# Patient Record
Sex: Female | Born: 1950
Health system: Southern US, Community
[De-identification: ages and names within clinical notes are randomized; demographics above are authoritative.]

## PROBLEM LIST (undated history)

## (undated) DIAGNOSIS — I10 Essential (primary) hypertension: Secondary | ICD-10-CM

## (undated) DIAGNOSIS — K8020 Calculus of gallbladder without cholecystitis without obstruction: Secondary | ICD-10-CM

## (undated) DIAGNOSIS — K869 Disease of pancreas, unspecified: Secondary | ICD-10-CM

## (undated) DIAGNOSIS — K219 Gastro-esophageal reflux disease without esophagitis: Secondary | ICD-10-CM

## (undated) DIAGNOSIS — K859 Acute pancreatitis without necrosis or infection, unspecified: Secondary | ICD-10-CM

## (undated) DIAGNOSIS — F329 Major depressive disorder, single episode, unspecified: Secondary | ICD-10-CM

## (undated) DIAGNOSIS — M549 Dorsalgia, unspecified: Secondary | ICD-10-CM

## (undated) DIAGNOSIS — K589 Irritable bowel syndrome without diarrhea: Secondary | ICD-10-CM

## (undated) DIAGNOSIS — E538 Deficiency of other specified B group vitamins: Secondary | ICD-10-CM

## (undated) DIAGNOSIS — R002 Palpitations: Secondary | ICD-10-CM

## (undated) DIAGNOSIS — F32A Depression, unspecified: Secondary | ICD-10-CM

## (undated) DIAGNOSIS — M1990 Unspecified osteoarthritis, unspecified site: Secondary | ICD-10-CM

## (undated) DIAGNOSIS — N912 Amenorrhea, unspecified: Secondary | ICD-10-CM

## (undated) DIAGNOSIS — R0602 Shortness of breath: Secondary | ICD-10-CM

## (undated) DIAGNOSIS — M7989 Other specified soft tissue disorders: Secondary | ICD-10-CM

## (undated) DIAGNOSIS — R32 Unspecified urinary incontinence: Secondary | ICD-10-CM

## (undated) DIAGNOSIS — K5900 Constipation, unspecified: Secondary | ICD-10-CM

## (undated) DIAGNOSIS — N390 Urinary tract infection, site not specified: Secondary | ICD-10-CM

## (undated) DIAGNOSIS — J301 Allergic rhinitis due to pollen: Secondary | ICD-10-CM

## (undated) DIAGNOSIS — F419 Anxiety disorder, unspecified: Secondary | ICD-10-CM

## (undated) DIAGNOSIS — N939 Abnormal uterine and vaginal bleeding, unspecified: Secondary | ICD-10-CM

## (undated) DIAGNOSIS — R1310 Dysphagia, unspecified: Secondary | ICD-10-CM

## (undated) HISTORY — DX: Shortness of breath: R06.02

## (undated) HISTORY — DX: Urinary tract infection, site not specified: N39.0

## (undated) HISTORY — DX: Deficiency of other specified B group vitamins: E53.8

## (undated) HISTORY — DX: Allergic rhinitis due to pollen: J30.1

## (undated) HISTORY — DX: Amenorrhea, unspecified: N91.2

## (undated) HISTORY — DX: Calculus of gallbladder without cholecystitis without obstruction: K80.20

## (undated) HISTORY — DX: Depression, unspecified: F32.A

## (undated) HISTORY — DX: Dysphagia, unspecified: R13.10

## (undated) HISTORY — DX: Acute pancreatitis without necrosis or infection, unspecified: K85.90

## (undated) HISTORY — DX: Essential (primary) hypertension: I10

## (undated) HISTORY — DX: Anxiety disorder, unspecified: F41.9

## (undated) HISTORY — DX: Gastro-esophageal reflux disease without esophagitis: K21.9

## (undated) HISTORY — DX: Unspecified osteoarthritis, unspecified site: M19.90

## (undated) HISTORY — PX: OOPHORECTOMY: SHX86

## (undated) HISTORY — DX: Unspecified urinary incontinence: R32

## (undated) HISTORY — DX: Abnormal uterine and vaginal bleeding, unspecified: N93.9

## (undated) HISTORY — DX: Disease of pancreas, unspecified: K86.9

## (undated) HISTORY — DX: Constipation, unspecified: K59.00

## (undated) HISTORY — PX: TONSILLECTOMY: SUR1361

## (undated) HISTORY — DX: Irritable bowel syndrome, unspecified: K58.9

## (undated) HISTORY — DX: Palpitations: R00.2

## (undated) HISTORY — DX: Other specified soft tissue disorders: M79.89

## (undated) HISTORY — DX: Dorsalgia, unspecified: M54.9

## (undated) HISTORY — DX: Major depressive disorder, single episode, unspecified: F32.9

## (undated) HISTORY — PX: TUBAL LIGATION: SHX77

---

## 1982-09-04 HISTORY — PX: APPENDECTOMY: SHX54

## 1982-09-04 HISTORY — PX: ABDOMINAL HYSTERECTOMY: SHX81

## 1999-09-08 HISTORY — PX: CHOLECYSTECTOMY: SHX55

## 2008-08-04 HISTORY — PX: ERCP W/ SPHINCTEROTOMY AND BALLOON DILATION: SHX1524

## 2008-09-24 ENCOUNTER — Encounter: Payer: Self-pay | Admitting: Internal Medicine

## 2010-04-01 ENCOUNTER — Ambulatory Visit: Payer: Self-pay | Admitting: Family Medicine

## 2010-04-01 DIAGNOSIS — Z8719 Personal history of other diseases of the digestive system: Secondary | ICD-10-CM | POA: Insufficient documentation

## 2010-04-01 DIAGNOSIS — F339 Major depressive disorder, recurrent, unspecified: Secondary | ICD-10-CM

## 2010-04-04 LAB — CONVERTED CEMR LAB
AST: 29 units/L (ref 0–37)
Albumin: 4.4 g/dL (ref 3.5–5.2)
BUN: 11 mg/dL (ref 6–23)
Basophils Relative: 0.9 % (ref 0.0–3.0)
Bilirubin, Direct: 0.2 mg/dL (ref 0.0–0.3)
Chloride: 104 meq/L (ref 96–112)
Eosinophils Absolute: 0.1 10*3/uL (ref 0.0–0.7)
Glucose, Bld: 88 mg/dL (ref 70–99)
HCT: 45.7 % (ref 36.0–46.0)
Hemoglobin: 15.2 g/dL — ABNORMAL HIGH (ref 12.0–15.0)
Lymphocytes Relative: 39 % (ref 12.0–46.0)
MCHC: 33.3 g/dL (ref 30.0–36.0)
Monocytes Absolute: 0.4 10*3/uL (ref 0.1–1.0)
Neutrophils Relative %: 50.9 % (ref 43.0–77.0)
Platelets: 226 10*3/uL (ref 150.0–400.0)
Potassium: 3.6 meq/L (ref 3.5–5.1)
RDW: 13.5 % (ref 11.5–14.6)
Sodium: 136 meq/L (ref 135–145)
TSH: 0.96 microintl units/mL (ref 0.35–5.50)
Total CHOL/HDL Ratio: 3
VLDL: 18.8 mg/dL (ref 0.0–40.0)
WBC: 5.5 10*3/uL (ref 4.5–10.5)

## 2010-04-05 ENCOUNTER — Encounter: Admission: RE | Admit: 2010-04-05 | Discharge: 2010-04-05 | Payer: Self-pay | Admitting: Family Medicine

## 2010-04-05 LAB — HM MAMMOGRAPHY

## 2010-09-02 ENCOUNTER — Ambulatory Visit
Admission: RE | Admit: 2010-09-02 | Discharge: 2010-09-02 | Payer: Self-pay | Source: Home / Self Care | Attending: Family Medicine | Admitting: Family Medicine

## 2010-09-02 DIAGNOSIS — J069 Acute upper respiratory infection, unspecified: Secondary | ICD-10-CM | POA: Insufficient documentation

## 2010-10-04 NOTE — Assessment & Plan Note (Signed)
Summary: NEW PT/PT WILL COME IN FASTING/NJR   Vital Signs:  Patient profile:   60 year old female Menstrual status:  hysterectomy Height:      61.25 inches Weight:      194 pounds BMI:     36.49 Temp:     98.0 degrees F oral Pulse rate:   80 / minute Pulse rhythm:   regular Resp:     12 per minute BP sitting:   160 / 100  (left arm) Cuff size:   regular  Vitals Entered By: [name] LPN (April 01, 2010 10:20 AM)  Nutrition Counseling: Patient's BMI is greater than 25 and therefore counseled on weight management options.  Serial Vital Signs/Assessments:  Time      Position  BP       Pulse  Resp  Temp     By                     150/88                         [name] MD  CC: New to establish Is Patient Diabetic? No     Menstrual Status hysterectomy Last PAP Result normal   History of Present Illness: patient is new to establish care.  Past medical history reviewed. History of recurrent pancreatitis with several attacks spanning from 1996-2009. She was diagnosed with pancreas divisum and underwent ERCP December 2009 and has had no episodes of pancreatitis since then. She has history of some mild urine incontinence. Occasional GERD symptoms. Seasonal allergies.  Multiple prior surgeries as indicated. Allergy to codeine and morphine.  Husband passed away from lung cancer couple months ago. She's had some expected reactive depression but overall coping well.  She has never smoked. Occasional alcohol use. She is retired from [employer].  Family history mother alcoholism and father prostate cancer.  last tetanus unknown. No history of colonoscopy screening. Last mammogram almost 5 years ago.  Preventive Screening-Counseling & Management  Alcohol-Tobacco     Smoking Status: never  Caffeine-Diet-Exercise     Does Patient Exercise: no  Past History:  Family History: Last updated: 04/01/2010 Mother & uncle, alcoholism father, and paternal  grandfather, prostate CA mothers sister, stroke & emotional illness  Social History: Last updated: 04/01/2010 Retired Widow/Widower Never Smoked Alcohol use-yes Regular exercise-no  Risk Factors: Exercise: no (04/01/2010)  Risk Factors: Smoking Status: never (04/01/2010)  Past Medical History: Depression Pancreatitis, hx of hay fever, allergies Occasional urine incontinence recurring UTI  Past Surgical History: Appendectomy Cholecystectomy Hysterectomy (fibroids) Tonsillectomy PMH-FH-SH reviewed for relevance  Family History: Mother & uncle, alcoholism father, and paternal grandfather, prostate CA mothers sister, stroke & emotional illness  Social History: Retired Conservation officer, nature Never Smoked Alcohol use-yes Regular exercise-no Smoking Status:  never Does Patient Exercise:  no  Review of Systems       The patient complains of weight gain.  The patient denies anorexia, fever, weight loss, vision loss, decreased hearing, hoarseness, chest pain, syncope, dyspnea on exertion, peripheral edema, prolonged cough, headaches, hemoptysis, abdominal pain, melena, hematochezia, severe indigestion/heartburn, hematuria, genital sores, muscle weakness, suspicious skin lesions, transient blindness, unusual weight change, abnormal bleeding, enlarged lymph nodes, and breast masses.    Physical Exam  General:  Well-developed,well-nourished,in no acute distress; alert,appropriate and cooperative throughout examination Head:  Normocephalic and atraumatic without obvious abnormalities. No apparent alopecia or balding. Eyes:  No corneal or conjunctival inflammation noted. EOMI. Perrla. Funduscopic  exam benign, without hemorrhages, exudates or papilledema. Vision grossly normal. Ears:  External ear exam shows no significant lesions or deformities.  Otoscopic examination reveals clear canals, tympanic membranes are intact bilaterally without bulging, retraction, inflammation or discharge.  Hearing is grossly normal bilaterally. Mouth:  Oral mucosa and oropharynx without lesions or exudates.  Teeth in good repair. Neck:  No deformities, masses, or tenderness noted. Chest Wall:  No deformities, masses, or tenderness noted. Breasts:  No mass, nodules, thickening, tenderness, bulging, retraction, inflamation, nipple discharge or skin changes noted.   Lungs:  Normal respiratory effort, chest expands symmetrically. Lungs are clear to auscultation, no crackles or wheezes. Heart:  Normal rate and regular rhythm. S1 and S2 normal without gallop, murmur, click, rub or other extra sounds. Abdomen:  Bowel sounds positive,abdomen soft and non-tender without masses, organomegaly or hernias noted. Genitalia:  hysterctomy.  No indication for pap. Msk:  No deformity or scoliosis noted of thoracic or lumbar spine.   Extremities:  No clubbing, cyanosis, edema, or deformity noted with normal full range of motion of all joints.   Neurologic:  alert & oriented X3, cranial nerves II-XII intact, and strength normal in all extremities.   Skin:  no rashes and no suspicious lesions.   Cervical Nodes:  No lymphadenopathy noted Psych:  good eye contact and not anxious appearing.     Impression & Recommendations:  Problem # 1:  Preventive Health Care (ICD-V70.0) Tdap given.  Needs colon screening .    Problem # 2:  PANCREATITIS, HX OF (ICD-V12.70)  Problem # 3:  ROUTINE GENERAL MEDICAL EXAM@HEALTH  CARE FACL (ICD-V70.0) obtain screening labs. Patient needs repeat mammogram. Discussed colon cancer screening. She is reluctant to pursue colonoscopy this time but does agree to Hemoccult cards. Tetanus booster given. No indication for Pneumovax yet. Work on exercise and weight loss Orders: Specimen Handling (16109) Venipuncture (60454) TLB-Lipid Panel (80061-LIPID) TLB-BMP (Basic Metabolic Panel-BMET) (80048-METABOL) TLB-CBC Platelet - w/Differential (85025-CBCD) TLB-Hepatic/Liver Function Pnl  (80076-HEPATIC) TLB-TSH (Thyroid Stimulating Hormone) (84443-TSH)  Complete Medication List: 1)  Zyrtec Allergy 10 Mg Caps (Cetirizine hcl) .... Once daily as needed 2)  Diflucan 100 Mg Tabs (Fluconazole) .... As needed  Other Orders: Tdap => 85yrs IM (09811) Admin 1st Vaccine (91478)  Preventive Care Screening  Mammogram:    Date:  09/04/2004    Results:  normal   Pap Smear:    Date:  09/04/2004    Results:  normal     Orders Added: 1)  New Patient 40-64 years [99386] 2)  Specimen Handling [99000] 3)  Venipuncture [29562] 4)  Tdap => 44yrs IM [90715] 5)  Admin 1st Vaccine [90471] 6)  TLB-Lipid Panel [80061-LIPID] 7)  TLB-BMP (Basic Metabolic Panel-BMET) [80048-METABOL] 8)  TLB-CBC Platelet - w/Differential [85025-CBCD] 9)  TLB-Hepatic/Liver Function Pnl [80076-HEPATIC] 10)  TLB-TSH (Thyroid Stimulating Hormone) [13086-VHQ]    Immunizations Administered:  Tetanus Vaccine:    Vaccine Type: Tdap    Site: left deltoid    Mfr: GlaxoSmithKline    Dose: 0.5 ml    Route: IM    Given by: [name] LPN    Exp. Date: 09/05/2011    Lot #: [id number]

## 2010-10-04 NOTE — Letter (Signed)
Summary: Records from Dr. Alecia Lemming 2006 - 2010  Records from Dr. Alecia Lemming 2006 - 2010   Imported By: Maryln Gottron 04/07/2010 15:31:18  _____________________________________________________________________  External Attachment:    Type:   Image     Comment:   External Document

## 2010-10-06 NOTE — Assessment & Plan Note (Signed)
Summary: sinus/cough/congestion/swollen glands/sorethroat/cjr   Vital Signs:  Patient profile:   60 year old female Menstrual status:  hysterectomy Weight:      194 pounds Temp:     98.0 degrees F oral BP sitting:   120 / 90  (left arm) Cuff size:   regular  Vitals Entered By: [name] LPN (September 02, 2010 9:43 AM)  History of Present Illness: Onset Monday of illness       This is a 60 year old woman who presents with URI symptoms.  The patient complains of nasal congestion, purulent nasal discharge, sore throat, earache, and sick contacts, but denies productive cough.  The patient denies fever.  The patient also reports headache and muscle aches.  Risk factors for Strep sinusitis include tender adenopathy.  The patient denies the following risk factors for Strep sinusitis: unilateral facial pain and double sickening.    Allergies (verified): No Known Drug Allergies  Past History:  Past Medical History: Last updated: 04/01/2010 Depression Pancreatitis, hx of hay fever, allergies Occasional urine incontinence recurring UTI PMH reviewed for relevance  Review of Systems      See HPI  Physical Exam  General:  Well-developed,well-nourished,in no acute distress; alert,appropriate and cooperative throughout examination Ears:  External ear exam shows no significant lesions or deformities.  Otoscopic examination reveals clear canals, tympanic membranes are intact bilaterally without bulging, retraction, inflammation or discharge. Hearing is grossly normal bilaterally. Mouth:  Oral mucosa and oropharynx without lesions or exudates.  Teeth in good repair. Neck:  No deformities, masses, or tenderness noted. Lungs:  Normal respiratory effort, chest expands symmetrically. Lungs are clear to auscultation, no crackles or wheezes. Heart:  normal rate and regular rhythm.   Skin:  no rashes and no suspicious lesions.   Cervical Nodes:  No lymphadenopathy noted   Impression &  Recommendations:  Problem # 1:  URI (ICD-465.9) likely viral.  NO antibiotics rec at this time .  Did print rx for Z-max to start if she has indicators of progressive sinusitis. Her updated medication list for this problem includes:    Zyrtec Allergy 10 Mg Caps (Cetirizine hcl) ..... Once daily as needed    Hydrocodone-homatropine 5-1.5 Mg/44ml Syrp (Hydrocodone-homatropine) ..... One tsp by mouth q 6 hours as needed cough  Complete Medication List: 1)  Zyrtec Allergy 10 Mg Caps (Cetirizine hcl) .... Once daily as needed 2)  Diflucan 100 Mg Tabs (Fluconazole) .... As needed 3)  Hydrocodone-homatropine 5-1.5 Mg/16ml Syrp (Hydrocodone-homatropine) .... One tsp by mouth q 6 hours as needed cough 4)  Azithromycin 250 Mg Tabs (Azithromycin) .... 2 by mouth today then one by mouth once daily for 4 days Prescriptions: AZITHROMYCIN 250 MG TABS (AZITHROMYCIN) 2 by mouth today then one by mouth once daily for 4 days  #6 x 0   Entered and Authorized by:   [name] MD   Signed by:   [name] MD on 09/02/2010   Method used:   Print then Give to Patient   RxID:   [id number] HYDROCODONE-HOMATROPINE 5-1.5 MG/5ML SYRP (HYDROCODONE-HOMATROPINE) one tsp by mouth q 6 hours as needed cough  #120 ml x 0   Entered and Authorized by:   [name] MD   Signed by:   [name] MD on 09/02/2010   Method used:   Print then Give to Patient   RxID:   [id number]    Orders Added: 1)  Est. Patient Level III [32440]

## 2010-11-25 ENCOUNTER — Telehealth: Payer: Self-pay | Admitting: *Deleted

## 2010-11-25 NOTE — Telephone Encounter (Signed)
Lengthy VM from pt requesting a letter.  She is applying for long term care for her recurring pancreatitis and medical history.  Pt questioning if we had received her medical records she requested and discussed at OV re: her condition.  Please fax letter to [phone], attn: Domingo Sep Also send copy of letter to pt home

## 2010-11-28 NOTE — Telephone Encounter (Signed)
I recommend follow up to discuss.  I will need more specifics regarding type of letter she is looking for.

## 2010-11-28 NOTE — Telephone Encounter (Signed)
Linda Blackburn, please schedul pt for appt with Dr Caryl Never.  Thanks, nn

## 2010-11-29 NOTE — Telephone Encounter (Signed)
I called pt and sch her for ov to discuss long  Term care, 12/06/10 at 11am, as noted.

## 2010-12-05 ENCOUNTER — Encounter: Payer: Self-pay | Admitting: Family Medicine

## 2010-12-06 ENCOUNTER — Encounter: Payer: Self-pay | Admitting: Family Medicine

## 2010-12-06 ENCOUNTER — Ambulatory Visit (INDEPENDENT_AMBULATORY_CARE_PROVIDER_SITE_OTHER): Payer: BC Managed Care – PPO | Admitting: Family Medicine

## 2010-12-06 VITALS — BP 120/84 | Temp 98.4°F | Ht 61.25 in | Wt 192.0 lb

## 2010-12-06 DIAGNOSIS — R05 Cough: Secondary | ICD-10-CM

## 2010-12-06 DIAGNOSIS — K859 Acute pancreatitis without necrosis or infection, unspecified: Secondary | ICD-10-CM

## 2010-12-06 DIAGNOSIS — K861 Other chronic pancreatitis: Secondary | ICD-10-CM

## 2010-12-06 NOTE — Patient Instructions (Signed)
Be in touch in 2-3 weeks if cough no better and we will consider CXR.

## 2010-12-06 NOTE — Progress Notes (Signed)
  Subjective:    Patient ID: Linda Blackburn, female    DOB: 1951/01/05, 60 y.o.   MRN: [id number]  HPI Patient seen for the following issues  Ongoing dry cough for the past month or so. Initially felt to be viral process. Does not feel particularly sick. No postnasal drip symptoms. Denies GERD symptoms. Taking Pepcid fairly regularly. No history of asthma. Nonsmoker. Denies dyspnea, pleuritic pain, or hemoptysis. No appetite or weight changes.  She has history of recurrent pancreatitis. Diagnosis approximately 2 years ago of pancreas divisum and she had ERCP with sphincterotomy and has had no recurrences since then. She has no history of alcohol use. Prior cholecystectomy. Normal lipids. Takes no regular medications. She is trying to get long-term care insurance and needs a statement that her pancreatitis has improved since her surgical procedure.   Review of Systems  Constitutional: Negative for fever, chills, appetite change, fatigue and unexpected weight change.  HENT: Negative for congestion, sore throat, rhinorrhea, postnasal drip and sinus pressure.   Respiratory: Positive for cough. Negative for shortness of breath and wheezing.   Cardiovascular: Negative for chest pain, palpitations and leg swelling.  Skin: Negative for rash.  Neurological: Negative for syncope, weakness and headaches.  Hematological: Negative for adenopathy. Does not bruise/bleed easily.       Objective:   Physical Exam  Constitutional: She is oriented to person, place, and time. She appears well-developed and well-nourished.  HENT:  Head: Normocephalic and atraumatic.  Right Ear: External ear normal.  Left Ear: External ear normal.  Mouth/Throat: Oropharynx is clear and moist. No oropharyngeal exudate.  Neck: Neck supple.  Cardiovascular: Normal rate, regular rhythm and normal heart sounds.   No murmur heard. Pulmonary/Chest: Effort normal and breath sounds normal. She has no wheezes. She has no rales.    Abdominal: Soft. There is no tenderness.  Musculoskeletal: She exhibits no edema.  Lymphadenopathy:    She has no cervical adenopathy.  Neurological: She is alert and oriented to person, place, and time.  Skin: No rash noted.          Assessment & Plan:  #1 Cough-?allergic.  Try over the counter antihistamine.  CXR in 2 weeks if no better.  No asthma or GERD. #2 Hx recurrent pancreatitis-?hx pancreas divisum.

## 2011-01-18 ENCOUNTER — Ambulatory Visit (INDEPENDENT_AMBULATORY_CARE_PROVIDER_SITE_OTHER): Payer: BC Managed Care – PPO | Admitting: Family Medicine

## 2011-01-18 ENCOUNTER — Encounter: Payer: Self-pay | Admitting: Family Medicine

## 2011-01-18 DIAGNOSIS — F329 Major depressive disorder, single episode, unspecified: Secondary | ICD-10-CM

## 2011-01-18 DIAGNOSIS — K859 Acute pancreatitis without necrosis or infection, unspecified: Secondary | ICD-10-CM

## 2011-01-18 DIAGNOSIS — Q453 Other congenital malformations of pancreas and pancreatic duct: Secondary | ICD-10-CM | POA: Insufficient documentation

## 2011-01-18 DIAGNOSIS — G4700 Insomnia, unspecified: Secondary | ICD-10-CM

## 2011-01-18 LAB — CBC WITH DIFFERENTIAL/PLATELET
Basophils Relative: 1.9 % (ref 0.0–3.0)
Hemoglobin: 14.5 g/dL (ref 12.0–15.0)
Lymphs Abs: 3.4 10*3/uL (ref 0.7–4.0)
MCHC: 34.1 g/dL (ref 30.0–36.0)
Monocytes Absolute: 0.6 10*3/uL (ref 0.1–1.0)
Platelets: 722 10*3/uL — ABNORMAL HIGH (ref 150.0–400.0)
RBC: 4.77 Mil/uL (ref 3.87–5.11)
WBC: 7.8 10*3/uL (ref 4.5–10.5)

## 2011-01-18 LAB — BASIC METABOLIC PANEL
BUN: 9 mg/dL (ref 6–23)
Chloride: 102 mEq/L (ref 96–112)
GFR: 105.61 mL/min (ref >60.00)
Glucose, Bld: 84 mg/dL (ref 70–99)
Potassium: 5.3 mEq/L — ABNORMAL HIGH (ref 3.5–5.1)
Sodium: 140 mEq/L (ref 135–145)

## 2011-01-18 NOTE — Patient Instructions (Signed)
Follow up promptly for any recurrent abdominal pain, fever, or nausea and vomiting.

## 2011-01-18 NOTE — Progress Notes (Signed)
  Subjective:    Patient ID: Linda Blackburn, female    DOB: Jan 17, 1951, 60 y.o.   MRN: [id number]  HPI Patient seen for hospital followup. She was up in [place] visiting family April 30 recurrent pancreatitis. She has history of pancreas devisum and no flareup for approximately 3 years until episode above. She was hospitalized for 6 days. Still has some poor appetite but no nausea or vomiting. Minimal midepigastric soreness. Overall pain greatly improved. Generalized weakness. She recalls that she had hypokalemia during her hospitalization. She is eating mostly bland foods. She's had previous cholecystectomy. No history of hypertriglyceridemia. No alcohol use. No suspicious medications.  Patient is having some difficulties with sleep. She uses Ambien infrequently. She has a history of depression since loss of husband a year ago and is still somewhat down since her recent hospitalization. She is frustrated regarding recurrent episodes (of pancreatitis).   Review of Systems  Constitutional: Positive for appetite change. Negative for fever, chills and unexpected weight change.  Respiratory: Negative for cough and shortness of breath.   Cardiovascular: Negative for chest pain, palpitations and leg swelling.  Gastrointestinal: Negative for nausea, vomiting, diarrhea, constipation, blood in stool and abdominal distention.  Genitourinary: Negative for dysuria.       Objective:   Physical Exam  Constitutional: She is oriented to person, place, and time. She appears well-developed and well-nourished.  HENT:  Head: Normocephalic and atraumatic.  Mouth/Throat: Oropharynx is clear and moist.  Neck: No thyromegaly present.  Cardiovascular: Normal rate, regular rhythm and normal heart sounds.   Pulmonary/Chest: Effort normal and breath sounds normal. No respiratory distress. She has no wheezes. She has no rales.  Abdominal: Soft. Bowel sounds are normal. She exhibits no distension and no mass. There is no  rebound and no guarding.       Minimally tender in the epigastric area  Musculoskeletal: She exhibits no edema.  Lymphadenopathy:    She has no cervical adenopathy.  Neurological: She is alert and oriented to person, place, and time.  Psychiatric: She has a normal mood and affect.          Assessment & Plan:  #1 recent episode of acute recurrent pancreatitis. History of pancreas devisum.  Recheck labs of CBC, basic metabolic panel, and  lipase #2 insomnia. Continue low-dose Ambien as needed.  Sleep hygiene discussed. #3 intermittent depression. Discuss possible antidepressant this point she wishes to wait.

## 2011-01-19 NOTE — Progress Notes (Signed)
Quick Note:  Pt informed. She reports she was told her K was low in the hospital, so she was given a powered supplement in the hospital 2 separate days. She was not told what the K numbers were at that time. FYI ______

## 2011-01-20 NOTE — Progress Notes (Signed)
Suspect hemolysis.

## 2011-02-01 ENCOUNTER — Telehealth: Payer: Self-pay | Admitting: Family Medicine

## 2011-02-01 NOTE — Telephone Encounter (Signed)
Pt was taking Hydromet syrup for cough and Dr Caryl Never had prescribed this med. Pt has chest congestion that is white and clear.Pt req a diff cough syrup that would help break up the congestion. Pls call in to [facility] [address].

## 2011-02-01 NOTE — Telephone Encounter (Signed)
Pt informed, and if she is not improved by early next week, call for OV follow-up

## 2011-02-01 NOTE — Telephone Encounter (Signed)
Best expectorant is plain mucinex 2 tablets twice daily.

## 2011-02-01 NOTE — Telephone Encounter (Signed)
Please advise 

## 2011-03-13 ENCOUNTER — Telehealth: Payer: Self-pay | Admitting: Family Medicine

## 2011-03-13 ENCOUNTER — Other Ambulatory Visit: Payer: Self-pay | Admitting: Family Medicine

## 2011-03-13 DIAGNOSIS — Z1231 Encounter for screening mammogram for malignant neoplasm of breast: Secondary | ICD-10-CM

## 2011-03-13 MED ORDER — OXYCODONE-ACETAMINOPHEN 5-325 MG PO TABS: 1.0000 | ORAL_TABLET | Freq: Four times a day (QID) | ORAL | Status: DC | PRN

## 2011-03-13 MED ORDER — ZOLPIDEM TARTRATE 5 MG PO TABS: 5.0000 mg | ORAL_TABLET | Freq: Every evening | ORAL | Status: DC | PRN

## 2011-03-13 NOTE — Telephone Encounter (Signed)
Pt rcvd generic Zolpidem Tartrate 5 mg 1at bedtime (1-2 refills) and Oxycodone Acet 5-325 mg Pt taking prn for pancreatis pain. Pt rcvd these meds while she was hospitalized in [place] with Pancreatitis by Dr. Renie Ora. Pt req refills on both meds. Pls call in [facility] to [facility] [address]. Notify pt when Percocet script can be picked up.

## 2011-03-13 NOTE — Telephone Encounter (Signed)
Rx called in, oxycodone printed, pt informed ready for pick-up

## 2011-03-13 NOTE — Telephone Encounter (Signed)
OK to refill Ambien for 3 months.  Should not be taking oxycodone regularly but refill #30 with no additional refills.

## 2011-03-13 NOTE — Telephone Encounter (Signed)
Please advise 

## 2011-04-13 ENCOUNTER — Ambulatory Visit: Payer: BC Managed Care – PPO

## 2011-04-14 ENCOUNTER — Ambulatory Visit
Admission: RE | Admit: 2011-04-14 | Discharge: 2011-04-14 | Disposition: A | Discharge status: Home / Self Care | Payer: BC Managed Care – PPO | Source: Ambulatory Visit | Attending: Family Medicine | Admitting: Family Medicine

## 2011-04-14 DIAGNOSIS — Z1231 Encounter for screening mammogram for malignant neoplasm of breast: Secondary | ICD-10-CM

## 2011-06-21 ENCOUNTER — Other Ambulatory Visit: Payer: BC Managed Care – PPO

## 2011-06-23 ENCOUNTER — Other Ambulatory Visit (INDEPENDENT_AMBULATORY_CARE_PROVIDER_SITE_OTHER): Payer: BC Managed Care – PPO

## 2011-06-23 DIAGNOSIS — Z0000 Encounter for general adult medical examination without abnormal findings: Secondary | ICD-10-CM

## 2011-06-23 LAB — CBC WITH DIFFERENTIAL/PLATELET
Eosinophils Absolute: 0.2 10*3/uL (ref 0.0–0.7)
Lymphs Abs: 2.1 10*3/uL (ref 0.7–4.0)
Monocytes Relative: 6.8 % (ref 3.0–12.0)
Platelets: 216 10*3/uL (ref 150.0–400.0)
RBC: 4.89 Mil/uL (ref 3.87–5.11)
RDW: 13.9 % (ref 11.5–14.6)

## 2011-06-23 LAB — POCT URINALYSIS DIPSTICK
Bilirubin, UA: NEGATIVE
Ketones, UA: NEGATIVE
Nitrite, UA: NEGATIVE
Protein, UA: NEGATIVE
Spec Grav, UA: 1.02
Urobilinogen, UA: 0.2

## 2011-06-23 LAB — LIPID PANEL
Cholesterol: 193 mg/dL (ref 0–200)
HDL: 53.4 mg/dL (ref >39.00)
LDL Cholesterol: 119 mg/dL — ABNORMAL HIGH (ref 0–99)
VLDL: 20.4 mg/dL (ref 0.0–40.0)

## 2011-06-23 LAB — HEPATIC FUNCTION PANEL
ALT: 17 U/L (ref 0–35)
AST: 23 U/L (ref 0–37)
Albumin: 4.2 g/dL (ref 3.5–5.2)
Alkaline Phosphatase: 67 U/L (ref 39–117)
Bilirubin, Direct: 0.1 mg/dL (ref 0.0–0.3)
Total Bilirubin: 0.8 mg/dL (ref 0.3–1.2)
Total Protein: 7.4 g/dL (ref 6.0–8.3)

## 2011-06-23 LAB — BASIC METABOLIC PANEL
Creatinine, Ser: 0.6 mg/dL (ref 0.4–1.2)
GFR: 99.78 mL/min (ref >60.00)
Glucose, Bld: 94 mg/dL (ref 70–99)

## 2011-06-28 ENCOUNTER — Ambulatory Visit (INDEPENDENT_AMBULATORY_CARE_PROVIDER_SITE_OTHER): Payer: BC Managed Care – PPO | Admitting: Family Medicine

## 2011-06-28 ENCOUNTER — Encounter: Payer: Self-pay | Admitting: Family Medicine

## 2011-06-28 VITALS — BP 140/70 | HR 72 | Temp 98.0°F | Resp 12 | Ht 61.0 in | Wt 170.0 lb

## 2011-06-28 DIAGNOSIS — Z0000 Encounter for general adult medical examination without abnormal findings: Secondary | ICD-10-CM

## 2011-06-28 DIAGNOSIS — Z23 Encounter for immunization: Secondary | ICD-10-CM

## 2011-06-28 NOTE — Patient Instructions (Signed)
Work on weight loss and establishing regular exercise. Check on insurance coverage for shingles vaccine.

## 2011-06-28 NOTE — Progress Notes (Signed)
Subjective:    Patient ID: Linda Blackburn, female    DOB: 1950/10/30, 60 y.o.   MRN: [id number]  HPI  Patient here for complete physical. Past medical history reviewed. His history is recurrent pancreatitis with no recent episodes. She takes over-the-counter Zyrtec for occasional allergies. No regular prescription medications. Tetanus last year. Mammogram in August. No history of colonoscopy and she is not agreeable to setting up at this time. Needs flu vaccine. No history of shingles vaccine.  Family history social history are viewed as below. History of hysterectomy for benign disease. No indication for Pap smear.  Past Medical History  Diagnosis Date  . Depression   . Pancreatitis   . Hay fever   . Urine incontinence   . UTI (urinary tract infection)     reoccuring   . DEPRESSION 04/01/2010  . PANCREATITIS, HX OF 04/01/2010   Past Surgical History  Procedure Date  . Appendectomy   . Cholecystectomy   . Tonsillectomy   . Abdominal hysterectomy     fibroids    reports that she has never smoked. She does not have any smokeless tobacco history on file. She reports that she drinks alcohol. She reports that she does not use illicit drugs. family history includes Alcohol abuse in her maternal uncle and mother; Cancer in her father and paternal grandfather; Mental illness in her maternal aunt; and Stroke in her maternal aunt. Allergies  Allergen Reactions  . Codeine     GI upset  . Morphine And Related     "In a scarry movie" weird thoughts      Review of Systems  Constitutional: Negative for fever, activity change, appetite change, fatigue and unexpected weight change.  HENT: Negative for hearing loss, ear pain, sore throat and trouble swallowing.   Eyes: Negative for visual disturbance.  Respiratory: Negative for cough and shortness of breath.   Cardiovascular: Negative for chest pain and palpitations.  Gastrointestinal: Negative for abdominal pain, diarrhea, constipation and  blood in stool.  Genitourinary: Negative for dysuria and hematuria.  Musculoskeletal: Negative for myalgias, back pain and arthralgias.  Skin: Negative for rash.  Neurological: Negative for dizziness, syncope and headaches.  Hematological: Negative for adenopathy.  Psychiatric/Behavioral: Negative for confusion and dysphoric mood.       Objective:   Physical Exam  Constitutional: She is oriented to person, place, and time. She appears well-developed and well-nourished.  HENT:  Head: Normocephalic and atraumatic.  Eyes: EOM are normal. Pupils are equal, round, and reactive to light.  Neck: Normal range of motion. Neck supple. No thyromegaly present.  Cardiovascular: Normal rate, regular rhythm and normal heart sounds.   No murmur heard. Pulmonary/Chest: Breath sounds normal. No respiratory distress. She has no wheezes. She has no rales.  Abdominal: Soft. Bowel sounds are normal. She exhibits no distension and no mass. There is no tenderness. There is no rebound and no guarding.  Genitourinary:       Breasts are symmetric with no mass. No nipple discharge or any skin dimpling  Musculoskeletal: Normal range of motion. She exhibits no edema.  Lymphadenopathy:    She has no cervical adenopathy.  Neurological: She is alert and oriented to person, place, and time. She displays normal reflexes. No cranial nerve deficit.  Skin: No rash noted.       Patient has some scaly well-demarcated brownish seborrheic keratoses including right for head, right trunk, and mid anterior chest.  Psychiatric: She has a normal mood and affect. Her behavior is normal. Judgment  and thought content normal.          Assessment & Plan:  #1 health maintenance. Flu vaccine given. Check on coverage for shingles vaccine. Tetanus up-to-date. Mammogram up-to-date. Discussed colon cancer screening and she is resistant at this time. #2 seborrheic keratoses. She'll return later to have these treated with liquid  nitrogen #3 trace blood on urine dipstick. Repeat urine at followup

## 2011-08-14 ENCOUNTER — Encounter: Payer: Self-pay | Admitting: Family Medicine

## 2011-08-14 ENCOUNTER — Ambulatory Visit (INDEPENDENT_AMBULATORY_CARE_PROVIDER_SITE_OTHER): Payer: BC Managed Care – PPO | Admitting: Family Medicine

## 2011-08-14 VITALS — BP 140/90 | Temp 98.5°F | Wt 171.0 lb

## 2011-08-14 DIAGNOSIS — L919 Hypertrophic disorder of the skin, unspecified: Secondary | ICD-10-CM

## 2011-08-14 DIAGNOSIS — Z23 Encounter for immunization: Secondary | ICD-10-CM

## 2011-08-14 DIAGNOSIS — R030 Elevated blood-pressure reading, without diagnosis of hypertension: Secondary | ICD-10-CM

## 2011-08-14 DIAGNOSIS — L918 Other hypertrophic disorders of the skin: Secondary | ICD-10-CM

## 2011-08-14 DIAGNOSIS — L820 Inflamed seborrheic keratosis: Secondary | ICD-10-CM

## 2011-08-14 DIAGNOSIS — L821 Other seborrheic keratosis: Secondary | ICD-10-CM

## 2011-08-14 DIAGNOSIS — R319 Hematuria, unspecified: Secondary | ICD-10-CM

## 2011-08-14 LAB — POCT URINALYSIS DIPSTICK
Glucose, UA: NEGATIVE
Spec Grav, UA: >=1.03
Urobilinogen, UA: 0.2

## 2011-08-14 NOTE — Progress Notes (Signed)
  Subjective:    Patient ID: Linda Blackburn, female    DOB: 1951/01/10, 60 y.o.   MRN: [id number]  HPI  Patient for several issues. She has couple of irritated skin lesions that she would like to have treated. One is right side of neck which is a skin tag irritated by jewelry. She has seborrheic keratoses right back and mid chest irritated by bra. No history of skin cancer.  Requesting shingles vaccine. She checked with insurance company and was told this was 100% covered. She has no contraindications. History of borderline elevated blood pressure. Never treated. No consistent exercise. Denies any headaches or dizziness.  Recent physical. Trace blood on urine dipstick. No history of gross hematuria. No urinary symptoms. No history of kidney stones. No history of smoking.   Review of Systems  Constitutional: Negative for appetite change, fatigue and unexpected weight change.  Respiratory: Negative for cough and shortness of breath.   Cardiovascular: Negative for chest pain.  Gastrointestinal: Negative for abdominal pain.  Genitourinary: Negative for dysuria and hematuria.  Neurological: Negative for dizziness and syncope.       Objective:   Physical Exam  Constitutional: She appears well-developed and well-nourished. No distress.  Neck: Neck supple. No thyromegaly present.  Cardiovascular: Normal rate and regular rhythm.   Pulmonary/Chest: Effort normal and breath sounds normal. No respiratory distress. She has no wheezes. She has no rales.  Musculoskeletal: She exhibits no edema.  Skin:       Patient has very small skin tag right side of neck. Patient has brown scaly well demarcated symmetric skin lesions one mid anterior chest second one which is oval brown well-demarcated scaly surface right thoracic region          Assessment & Plan:  #1 right neck skin tag and seborrheic keratoses mid chest and right thoracic region which are irritated by clothing and jewelry. Recommend  treatment with liquid nitrogen for each and patient consented. Treated without difficulty #2 elevated blood pressure. Work on weight loss and monitor #3 history of trace blood present urine dipstick. Repeat today  #4 health maintenance. Shingles vaccine given. She has no contraindications and consents

## 2011-08-14 NOTE — Patient Instructions (Signed)
Work on weight loss and monitor blood pressure. Goal blood pressure is less than 140/90

## 2011-10-11 ENCOUNTER — Encounter: Payer: Self-pay | Admitting: Family Medicine

## 2011-10-11 ENCOUNTER — Ambulatory Visit (INDEPENDENT_AMBULATORY_CARE_PROVIDER_SITE_OTHER): Payer: BC Managed Care – PPO | Admitting: Family Medicine

## 2011-10-11 VITALS — BP 150/90 | Temp 97.8°F | Wt 178.0 lb

## 2011-10-11 DIAGNOSIS — J069 Acute upper respiratory infection, unspecified: Secondary | ICD-10-CM

## 2011-10-11 MED ORDER — FLUCONAZOLE 150 MG PO TABS: 150.0000 mg | ORAL_TABLET | Freq: Once | ORAL | Status: DC

## 2011-10-11 NOTE — Progress Notes (Signed)
  Subjective:    Patient ID: Nettie Wyffels, female    DOB: 04/05/1951, 61 y.o.   MRN: [id number]  HPI  Acute visit. Sore throat off-and-on for 2 weeks with some intermittent nasal congestion. Mild hoarseness. Patient denies any fever or chills. No cough. Taking over-the-counter DayQuil which is providing some relief. She denies any focal facial pain or any purulent nasal secretions.   Review of Systems  Constitutional: Negative for fever and chills.  HENT: Positive for congestion, sore throat and sinus pressure. Negative for trouble swallowing, neck pain and neck stiffness.   Respiratory: Negative for cough.   Neurological: Negative for headaches.       Objective:   Physical Exam  Constitutional: She appears well-developed and well-nourished.  HENT:  Right Ear: External ear normal.  Left Ear: External ear normal.       Prominent papillae the dorsum of tongue but no evidence for thrush.  Minimal erythema posterior pharynx. No exudate.  Neck: Neck supple.  Cardiovascular: Normal rate and regular rhythm.   Pulmonary/Chest: Effort normal and breath sounds normal. No respiratory distress. She has no wheezes. She has no rales.  Lymphadenopathy:    She has no cervical adenopathy.          Assessment & Plan:  Viral URI. Continue over-the-counter medications as needed. Followup for any fever or worsening symptoms

## 2011-10-11 NOTE — Patient Instructions (Signed)
Viral Infections A viral infection can be caused by different types of viruses.Most viral infections are not serious and resolve on their own. However, some infections may cause severe symptoms and may lead to further complications. SYMPTOMS Viruses can frequently cause:  Minor sore throat.   Aches and pains.   Headaches.   Runny nose.   Different types of rashes.   Watery eyes.   Tiredness.   Cough.   Loss of appetite.   Gastrointestinal infections, resulting in nausea, vomiting, and diarrhea.  These symptoms do not respond to antibiotics because the infection is not caused by bacteria. However, you might catch a bacterial infection following the viral infection. This is sometimes called a "superinfection." Symptoms of such a bacterial infection may include:  Worsening sore throat with pus and difficulty swallowing.   Swollen neck glands.   Chills and a high or persistent fever.   Severe headache.   Tenderness over the sinuses.   Persistent overall ill feeling (malaise), muscle aches, and tiredness (fatigue).   Persistent cough.   Yellow, green, or brown mucus production with coughing.  HOME CARE INSTRUCTIONS   Only take over-the-counter or prescription medicines for pain, discomfort, diarrhea, or fever as directed by your caregiver.   Drink enough water and fluids to keep your urine clear or pale yellow. Sports drinks can provide valuable electrolytes, sugars, and hydration.   Get plenty of rest and maintain proper nutrition. Soups and broths with crackers or rice are fine.  SEEK IMMEDIATE MEDICAL CARE IF:   You have severe headaches, shortness of breath, chest pain, neck pain, or an unusual rash.   You have uncontrolled vomiting, diarrhea, or you are unable to keep down fluids.   You or your child has an oral temperature above 102 F (38.9 C), not controlled by medicine.   Your baby is older than 3 months with a rectal temperature of 102 F (38.9 C) or  higher.   Your baby is 35 months old or younger with a rectal temperature of 100.4 F (38 C) or higher.  MAKE SURE YOU:   Understand these instructions.   Will watch your condition.   Will get help right away if you are not doing well or get worse.  Document Released: 05/31/2005 Document Revised: 05/03/2011 Document Reviewed: 12/26/2010 [facility] Patient Information 2012 [place], [place].  Consider home blood pressure monitor and be in touch if blood pressures > 140/90

## 2012-03-12 ENCOUNTER — Other Ambulatory Visit: Payer: Self-pay | Admitting: Family Medicine

## 2012-03-12 DIAGNOSIS — Z1231 Encounter for screening mammogram for malignant neoplasm of breast: Secondary | ICD-10-CM

## 2012-04-17 ENCOUNTER — Ambulatory Visit
Admission: RE | Admit: 2012-04-17 | Discharge: 2012-04-17 | Disposition: A | Discharge status: Home / Self Care | Payer: BC Managed Care – PPO | Source: Ambulatory Visit | Attending: Family Medicine | Admitting: Family Medicine

## 2012-04-17 DIAGNOSIS — Z1231 Encounter for screening mammogram for malignant neoplasm of breast: Secondary | ICD-10-CM

## 2012-07-02 ENCOUNTER — Other Ambulatory Visit (INDEPENDENT_AMBULATORY_CARE_PROVIDER_SITE_OTHER): Payer: BC Managed Care – PPO

## 2012-07-02 DIAGNOSIS — Z0000 Encounter for general adult medical examination without abnormal findings: Secondary | ICD-10-CM

## 2012-07-02 LAB — HEPATIC FUNCTION PANEL
ALT: 22 U/L (ref 0–35)
Albumin: 4.1 g/dL (ref 3.5–5.2)
Alkaline Phosphatase: 71 U/L (ref 39–117)
Bilirubin, Direct: 0.2 mg/dL (ref 0.0–0.3)
Total Protein: 7.6 g/dL (ref 6.0–8.3)

## 2012-07-02 LAB — POCT URINALYSIS DIPSTICK
Leukocytes, UA: NEGATIVE
Urobilinogen, UA: 0.2

## 2012-07-02 LAB — CBC WITH DIFFERENTIAL/PLATELET
Eosinophils Absolute: 0.1 10*3/uL (ref 0.0–0.7)
HCT: 45.5 % (ref 36.0–46.0)
Hemoglobin: 15.1 g/dL — ABNORMAL HIGH (ref 12.0–15.0)
Lymphs Abs: 2 10*3/uL (ref 0.7–4.0)
RBC: 5.07 Mil/uL (ref 3.87–5.11)
RDW: 13.2 % (ref 11.5–14.6)
WBC: 4.7 10*3/uL (ref 4.5–10.5)

## 2012-07-02 LAB — BASIC METABOLIC PANEL
CO2: 27 mEq/L (ref 19–32)
Calcium: 9.2 mg/dL (ref 8.4–10.5)
Chloride: 107 mEq/L (ref 96–112)
Creatinine, Ser: 0.8 mg/dL (ref 0.4–1.2)

## 2012-07-02 LAB — LIPID PANEL
Cholesterol: 171 mg/dL (ref 0–200)
Triglycerides: 90 mg/dL (ref 0.0–149.0)
VLDL: 18 mg/dL (ref 0.0–40.0)

## 2012-07-10 ENCOUNTER — Encounter: Payer: BC Managed Care – PPO | Admitting: Family Medicine

## 2012-07-10 ENCOUNTER — Encounter: Payer: Self-pay | Admitting: Family Medicine

## 2012-07-10 ENCOUNTER — Ambulatory Visit (INDEPENDENT_AMBULATORY_CARE_PROVIDER_SITE_OTHER): Payer: BC Managed Care – PPO | Admitting: Family Medicine

## 2012-07-10 VITALS — BP 170/100 | HR 80 | Temp 98.0°F | Resp 12 | Ht 60.75 in | Wt 182.0 lb

## 2012-07-10 DIAGNOSIS — L909 Atrophic disorder of skin, unspecified: Secondary | ICD-10-CM

## 2012-07-10 DIAGNOSIS — Z23 Encounter for immunization: Secondary | ICD-10-CM

## 2012-07-10 DIAGNOSIS — L918 Other hypertrophic disorders of the skin: Secondary | ICD-10-CM

## 2012-07-10 DIAGNOSIS — Z0000 Encounter for general adult medical examination without abnormal findings: Secondary | ICD-10-CM

## 2012-07-10 DIAGNOSIS — R1013 Epigastric pain: Secondary | ICD-10-CM

## 2012-07-10 MED ORDER — ZOLPIDEM TARTRATE 5 MG PO TABS: 5.0000 mg | ORAL_TABLET | Freq: Every evening | ORAL | Status: DC | PRN

## 2012-07-10 MED ORDER — OXYCODONE-ACETAMINOPHEN 5-325 MG PO TABS: 1.0000 | ORAL_TABLET | Freq: Four times a day (QID) | ORAL | Status: DC | PRN

## 2012-07-10 MED ORDER — METOCLOPRAMIDE HCL 10 MG PO TABS: 10.0000 mg | ORAL_TABLET | Freq: Four times a day (QID) | ORAL | Status: DC

## 2012-07-10 MED ORDER — FLUCONAZOLE 150 MG PO TABS: 150.0000 mg | ORAL_TABLET | Freq: Once | ORAL | Status: DC

## 2012-07-10 NOTE — Progress Notes (Signed)
Subjective:    Patient ID: Linda Blackburn, female    DOB: May 26, 1951, 61 y.o.   MRN: [id number]  HPI  Patient seen for complete physical. She has past history of pancreas devisum. Had episode back in August of some epigastric pain similar to prior pancreatitis episodes associated with nausea. Since that time, she's had no acute abdominal pain but almost daily symptoms of feeling full after eating. This tends to be worse at night. She frequently feels bloated. No vomiting. No stool changes. No alleviating factors.  Patient has had borderline elevated blood pressure in past but no diagnosis of hypertension. No headaches. No dizziness.  Previous hysterectomy for benign disease. She has refused colonoscopies. Needs flu vaccine. Tetanus up-to-date. Mammogram up-to-date.  Small skin tag left neck which is irritating because of jewelry. Requesting treatment. She has history of transient insomnia and uses Ambien as needed and requesting refills.  Past Medical History  Diagnosis Date  . Depression   . Pancreatitis   . Hay fever   . Urine incontinence   . UTI (urinary tract infection)     reoccuring   . DEPRESSION 04/01/2010  . PANCREATITIS, HX OF 04/01/2010   Past Surgical History  Procedure Date  . Appendectomy   . Cholecystectomy   . Tonsillectomy   . Abdominal hysterectomy     fibroids    reports that she has never smoked. She does not have any smokeless tobacco history on file. She reports that she drinks alcohol. She reports that she does not use illicit drugs. family history includes Alcohol abuse in her maternal uncle and mother; Cancer in her father and paternal grandfather; Mental illness in her maternal aunt; and Stroke in her maternal aunt. Allergies  Allergen Reactions  . Codeine     GI upset  . Morphine And Related     "In a scarry movie" weird thoughts      Review of Systems  Constitutional: Negative for fever, activity change, appetite change, fatigue and unexpected  weight change.  HENT: Negative for hearing loss, ear pain, sore throat and trouble swallowing.   Eyes: Negative for visual disturbance.  Respiratory: Negative for cough and shortness of breath.   Cardiovascular: Negative for chest pain and palpitations.  Gastrointestinal: Negative for nausea, vomiting, diarrhea, constipation and blood in stool.  Genitourinary: Negative for dysuria and hematuria.  Musculoskeletal: Negative for myalgias, back pain and arthralgias.  Skin: Negative for rash.  Neurological: Negative for dizziness, syncope and headaches.  Hematological: Negative for adenopathy.  Psychiatric/Behavioral: Negative for confusion and dysphoric mood.       Objective:   Physical Exam  Constitutional: She is oriented to person, place, and time. She appears well-developed and well-nourished.  HENT:  Right Ear: External ear normal.  Left Ear: External ear normal.  Mouth/Throat: Oropharynx is clear and moist.  Eyes: Pupils are equal, round, and reactive to light.  Neck: Neck supple. No thyromegaly present.  Cardiovascular: Normal rate and regular rhythm.   Pulmonary/Chest: Effort normal and breath sounds normal. No respiratory distress. She has no wheezes. She has no rales.  Genitourinary:       Breasts symmetric with no mass.  Musculoskeletal: She exhibits no edema.  Lymphadenopathy:    She has no cervical adenopathy.  Neurological: She is alert and oriented to person, place, and time. No cranial nerve deficit.  Skin: No rash noted.       Small, symmetric,  brown well demarcated seborrheic keratosis right neck.  Small skin tag right neck.  Psychiatric: She has a normal mood and affect. Her behavior is normal.          Assessment & Plan:  #1 complete physical.  No pap since hysterectomy for benign disease.  Continue yearly mammograms.  She is refusing colonoscopy.  Try to lose some weight.  Labs reviewed. #2 skin tag.  Treated with liquid nitrogen per pt request. #3  elevated blood pressure.  No dx hypertension.  Monitor and reassess 3 weeks. #4 postprandial dyspepsia.  Prior cholecystectomy.  ?gastroparesis.  Cautious trial Reglan 10 mg po qac supper and reviewed possible side effects.  Consider GI refer if no better.

## 2012-07-10 NOTE — Patient Instructions (Signed)
Monitor blood pressure closely and be in touch if consistently > 140/90. Record readings and we will review at follow up.

## 2012-07-29 ENCOUNTER — Encounter: Payer: Self-pay | Admitting: Family Medicine

## 2012-07-29 ENCOUNTER — Ambulatory Visit (INDEPENDENT_AMBULATORY_CARE_PROVIDER_SITE_OTHER): Payer: BC Managed Care – PPO | Admitting: Family Medicine

## 2012-07-29 VITALS — BP 150/100 | Temp 98.3°F | Wt 180.0 lb

## 2012-07-29 DIAGNOSIS — R030 Elevated blood-pressure reading, without diagnosis of hypertension: Secondary | ICD-10-CM

## 2012-07-29 MED ORDER — FLUCONAZOLE 150 MG PO TABS: 150.0000 mg | ORAL_TABLET | Freq: Once | ORAL | Status: AC

## 2012-07-29 MED ORDER — METOCLOPRAMIDE HCL 10 MG PO TABS: 10.0000 mg | ORAL_TABLET | Freq: Four times a day (QID) | ORAL | Status: DC

## 2012-07-29 NOTE — Patient Instructions (Addendum)

## 2012-07-29 NOTE — Progress Notes (Signed)
  Subjective:    Patient ID: Linda Blackburn, female    DOB: 1951/01/22, 61 y.o.   MRN: [id number]  HPI  Followup elevated blood pressure. Never diagnosed with hypertension. She brings in log of home readings from a friend's cuff and these are consistently less than 130s systolic and less than 80 diastolic. In general she feels well.  She has had some stress issues and anxiety issues regarding holidays. She's been widowed for about 2 years and had difficulty getting through holiday season. Denies any headache. No chest pains. Occasional mild dyspnea with activity but not consistently. No regular alcohol use. No regular nonsteroidal use. No consistent exercise.  Early satiety and fullness has improved greatly with Reglan 10 mg once daily.   Review of Systems  Constitutional: Negative for fatigue.  Eyes: Negative for visual disturbance.  Respiratory: Negative for cough, chest tightness, shortness of breath and wheezing.   Cardiovascular: Negative for chest pain, palpitations and leg swelling.  Neurological: Negative for dizziness, seizures, syncope, weakness, light-headedness and headaches.       Objective:   Physical Exam  Constitutional: She appears well-developed and well-nourished.  Cardiovascular: Normal rate and regular rhythm.   Pulmonary/Chest: Effort normal and breath sounds normal. No respiratory distress. She has no wheezes. She has no rales.  Musculoskeletal: She exhibits no edema.          Assessment & Plan:  Elevated blood pressure. We are getting inconsistent readings between home readings and elevated readings here. We've asked to patient return in one week and bring home cuff then. Avoid nonsteroidals. Alcohol in moderation. We discussed other factors such as weight loss and aerobic exercise that may be helpful.

## 2012-08-06 ENCOUNTER — Encounter: Payer: Self-pay | Admitting: Family Medicine

## 2012-08-06 ENCOUNTER — Ambulatory Visit (INDEPENDENT_AMBULATORY_CARE_PROVIDER_SITE_OTHER): Payer: BC Managed Care – PPO | Admitting: Family Medicine

## 2012-08-06 VITALS — BP 120/80 | Temp 98.2°F | Wt 180.0 lb

## 2012-08-06 DIAGNOSIS — R030 Elevated blood-pressure reading, without diagnosis of hypertension: Secondary | ICD-10-CM

## 2012-08-06 NOTE — Patient Instructions (Addendum)
Be sure to sit and rest for a few minutes each time before checking your blood pressure at home. Continue to monitor blood pressure at least once weekly and be in touch if consistently > 140/90

## 2012-08-06 NOTE — Progress Notes (Signed)
  Subjective:    Patient ID: Linda Blackburn, female    DOB: 1951-01-28, 61 y.o.   MRN: [id number]  HPI  Followup hypertension. Patient being getting consistent readings around 130/80 by home blood pressure cuff she actually has 2 machines. She brings in for comparison here today. We had recent blood pressure reading 150/100. She had some recent stress issues. She is currently not taking anything for hypertension. No formal diagnosis. Occasional nonsteroidal use.   Review of Systems  Respiratory: Negative for cough.   Cardiovascular: Negative for chest pain.  Gastrointestinal: Negative for abdominal pain.  Neurological: Negative for headaches.       Objective:   Physical Exam  Constitutional: She appears well-developed and well-nourished.  Cardiovascular: Normal rate and regular rhythm.   Pulmonary/Chest: Effort normal and breath sounds normal. No respiratory distress. She has no wheezes. She has no rales.          Assessment & Plan:  Elevated blood pressure by recent office visit. Consistently fairly well controlled by home readings. Repeat reading today left arm seated at rest 128/72 with large cuff. Continue observation. Establish more consistent aerobic exercise.

## 2012-08-19 ENCOUNTER — Other Ambulatory Visit: Payer: Self-pay | Admitting: Family Medicine

## 2012-08-19 MED ORDER — SERTRALINE HCL 50 MG PO TABS: 50.0000 mg | ORAL_TABLET | Freq: Every day | ORAL | Status: DC

## 2012-08-19 NOTE — Telephone Encounter (Signed)
Pt informed Rx sent and she will make a F/U appt. As requested

## 2012-08-19 NOTE — Telephone Encounter (Signed)
Please advise 

## 2012-08-19 NOTE — Telephone Encounter (Signed)
Sertraline 50 mg once daily and office follow up 2-3 weeks to reassess.

## 2012-08-19 NOTE — Telephone Encounter (Signed)
Pt states she spoke w/ MD at earlier appts about anxiety from her husbands death. Pt would like him to consider giving her something to help her through the holidays.

## 2012-09-11 ENCOUNTER — Encounter: Payer: Self-pay | Admitting: Family Medicine

## 2012-09-11 ENCOUNTER — Ambulatory Visit (INDEPENDENT_AMBULATORY_CARE_PROVIDER_SITE_OTHER): Payer: BC Managed Care – PPO | Admitting: Family Medicine

## 2012-09-11 VITALS — BP 190/100 | Temp 98.0°F | Wt 182.0 lb

## 2012-09-11 DIAGNOSIS — F4323 Adjustment disorder with mixed anxiety and depressed mood: Secondary | ICD-10-CM

## 2012-09-11 DIAGNOSIS — I10 Essential (primary) hypertension: Secondary | ICD-10-CM | POA: Insufficient documentation

## 2012-09-11 MED ORDER — LOSARTAN POTASSIUM 50 MG PO TABS: 50.0000 mg | ORAL_TABLET | Freq: Every day | ORAL | Status: DC

## 2012-09-11 NOTE — Patient Instructions (Addendum)

## 2012-09-11 NOTE — Progress Notes (Signed)
  Subjective:    Patient ID: Linda Blackburn, female    DOB: 27-Apr-1951, 62 y.o.   MRN: [id number]  HPI Patient here for followup depression/anxiety. Her husband passed away about 2 minutes years ago. As the holidays approached, she was having increased anxiety symptoms. She has what sounds like panic attacks. She had sudden onset of anxiousness and palpitations. She's also noticed some increased depressive symptoms with frequent crying spells. No suicidal ideation. Started sertraline 50 mg daily and she is seeing great improvement. No crying spells. Feels less anxious. No side effects.  Hypertension. Currently untreated. Blood pressures been somewhat up and down. She's felt somewhat flushed recently but no headaches. No chest pains. She's had several readings have been elevated here recently. No regular nonsteroidal use. No alcohol use regularly  Past Medical History  Diagnosis Date  . Depression   . Pancreatitis   . Hay fever   . Urine incontinence   . UTI (urinary tract infection)     reoccuring   . DEPRESSION 04/01/2010  . PANCREATITIS, HX OF 04/01/2010   Past Surgical History  Procedure Date  . Appendectomy   . Cholecystectomy   . Tonsillectomy   . Abdominal hysterectomy     fibroids    reports that she has never smoked. She does not have any smokeless tobacco history on file. She reports that she drinks alcohol. She reports that she does not use illicit drugs. family history includes Alcohol abuse in her maternal uncle and mother; Cancer in her father and paternal grandfather; Mental illness in her maternal aunt; and Stroke in her maternal aunt. Allergies  Allergen Reactions  . Codeine     GI upset  . Morphine And Related     "In a scarry movie" weird thoughts      Review of Systems  Eyes: Negative for visual disturbance.  Respiratory: Negative for cough, chest tightness, shortness of breath and wheezing.   Cardiovascular: Negative for chest pain, palpitations and leg  swelling.  Neurological: Negative for dizziness, seizures, syncope, weakness, light-headedness and headaches.  Psychiatric/Behavioral: Positive for self-injury (Improved on Zoloft) and dysphoric mood.       Objective:   Physical Exam  Constitutional: She appears well-developed and well-nourished.  Neck: Neck supple. No thyromegaly present.  Cardiovascular: Normal rate and regular rhythm.   Pulmonary/Chest: Effort normal and breath sounds normal. No respiratory distress. She has no wheezes. She has no rales.  Musculoskeletal: She exhibits no edema.          Assessment & Plan:  #1 anxiety/depression. Adjustment disorder with depressed mood. Greatly improved on sertraline. Recommend minimum of 6-9 months treatment. She appears to be responding to current dose of 50 mg once daily #2 hypertension. Currently untreated. Repeat reading today left arm seated at rest 190/100. Start losartan 50 mg once daily. We considered combination therapy such as losartan HCTZ but she's had quite labile blood pressures with most recent recording here 120/80. Reassess in 2 weeks

## 2012-10-02 ENCOUNTER — Ambulatory Visit: Payer: BC Managed Care – PPO | Admitting: Family Medicine

## 2012-10-03 ENCOUNTER — Ambulatory Visit: Payer: BC Managed Care – PPO | Admitting: Family Medicine

## 2012-10-04 ENCOUNTER — Ambulatory Visit (INDEPENDENT_AMBULATORY_CARE_PROVIDER_SITE_OTHER): Payer: BC Managed Care – PPO | Admitting: Family Medicine

## 2012-10-04 ENCOUNTER — Encounter: Payer: Self-pay | Admitting: Family Medicine

## 2012-10-04 VITALS — BP 160/88 | Temp 98.3°F | Wt 184.0 lb

## 2012-10-04 DIAGNOSIS — F3289 Other specified depressive episodes: Secondary | ICD-10-CM

## 2012-10-04 DIAGNOSIS — F329 Major depressive disorder, single episode, unspecified: Secondary | ICD-10-CM

## 2012-10-04 DIAGNOSIS — F4323 Adjustment disorder with mixed anxiety and depressed mood: Secondary | ICD-10-CM

## 2012-10-04 DIAGNOSIS — I10 Essential (primary) hypertension: Secondary | ICD-10-CM

## 2012-10-04 MED ORDER — ZOLPIDEM TARTRATE 5 MG PO TABS: 5.0000 mg | ORAL_TABLET | Freq: Every evening | ORAL | Status: DC | PRN

## 2012-10-04 MED ORDER — LOSARTAN POTASSIUM 100 MG PO TABS: 100.0000 mg | ORAL_TABLET | Freq: Every day | ORAL | Status: DC

## 2012-10-04 MED ORDER — SERTRALINE HCL 50 MG PO TABS: ORAL_TABLET | ORAL | Status: DC

## 2012-10-04 NOTE — Progress Notes (Signed)
  Subjective:    Patient ID: Linda Blackburn, female    DOB: 11-13-1950, 62 y.o.   MRN: [id number]  HPI Followup regarding adjustment disorder with depressed mood. She is seeing improvement with Zoloft but still having some anxiety symptoms at night. Poor sleep off and on. She would like to consider titration of sertraline. No side effects. No suicidal ideation. No nighttime caffeine use. No regular alcohol use.  Hypertension. Recent initiation losartan 50 mg daily. Compliant with therapy. No headaches. No dizziness. Patient denies side effects from medication. Poor dietary compliance. No chest pains  Past Medical History  Diagnosis Date  . Depression   . Pancreatitis   . Hay fever   . Urine incontinence   . UTI (urinary tract infection)     reoccuring   . DEPRESSION 04/01/2010  . PANCREATITIS, HX OF 04/01/2010   Past Surgical History  Procedure Date  . Appendectomy   . Cholecystectomy   . Tonsillectomy   . Abdominal hysterectomy     fibroids    reports that she has never smoked. She does not have any smokeless tobacco history on file. She reports that she drinks alcohol. She reports that she does not use illicit drugs. family history includes Alcohol abuse in her maternal uncle and mother; Cancer in her father and paternal grandfather; Mental illness in her maternal aunt; and Stroke in her maternal aunt. Allergies  Allergen Reactions  . Codeine     GI upset  . Morphine And Related     "In a scarry movie" weird thoughts      Review of Systems  Constitutional: Negative for fatigue.  Eyes: Negative for visual disturbance.  Respiratory: Negative for cough, chest tightness, shortness of breath and wheezing.   Cardiovascular: Negative for chest pain, palpitations and leg swelling.  Neurological: Negative for dizziness, seizures, syncope, weakness, light-headedness and headaches.  Psychiatric/Behavioral: Positive for sleep disturbance. The patient is nervous/anxious.         Objective:   Physical Exam  Constitutional: She appears well-developed and well-nourished.  Neck: Neck supple. No thyromegaly present.  Cardiovascular: Normal rate and regular rhythm.   Pulmonary/Chest: Effort normal and breath sounds normal. No respiratory distress. She has no wheezes. She has no rales.  Musculoskeletal: She exhibits no edema.  Neurological: She is alert.  Psychiatric: She has a normal mood and affect. Her behavior is normal.          Assessment & Plan:  #1 hypertension. Suboptimal control. Titrate losartan 100 mg daily. Try to lose some weight. Reassess one month. Consider addition of HCTZ then if no improvement #2 adjustment disorder with depressed mood. Increased anxiety symptoms. Titrate sertraline to 75 mg daily

## 2012-11-06 ENCOUNTER — Encounter: Payer: Self-pay | Admitting: Family Medicine

## 2012-11-06 ENCOUNTER — Ambulatory Visit (INDEPENDENT_AMBULATORY_CARE_PROVIDER_SITE_OTHER): Payer: BC Managed Care – PPO | Admitting: Family Medicine

## 2012-11-06 VITALS — BP 120/78 | Temp 98.1°F | Wt 186.0 lb

## 2012-11-06 DIAGNOSIS — F329 Major depressive disorder, single episode, unspecified: Secondary | ICD-10-CM

## 2012-11-06 DIAGNOSIS — I10 Essential (primary) hypertension: Secondary | ICD-10-CM

## 2012-11-06 DIAGNOSIS — L820 Inflamed seborrheic keratosis: Secondary | ICD-10-CM

## 2012-11-06 NOTE — Progress Notes (Signed)
  Subjective:    Patient ID: Linda Blackburn, female    DOB: Jan 11, 1951, 62 y.o.   MRN: [id number]  HPI Followup hypertension and depression  We titrated sertraline last visit to 75 mg. She is taking great improvement in mood. Tolerating with no side effects. Anxiety symptoms have improved  Hypertension treated with losartan. Poorly controlled last visit. We increased to 100 mg and blood pressures greatly improved at this time  Past Medical History  Diagnosis Date  . Depression   . Pancreatitis   . Hay fever   . Urine incontinence   . UTI (urinary tract infection)     reoccuring   . DEPRESSION 04/01/2010  . PANCREATITIS, HX OF 04/01/2010   Past Surgical History  Procedure Laterality Date  . Appendectomy    . Cholecystectomy    . Tonsillectomy    . Abdominal hysterectomy      fibroids    reports that she has never smoked. She does not have any smokeless tobacco history on file. She reports that  drinks alcohol. She reports that she does not use illicit drugs. family history includes Alcohol abuse in her maternal uncle and mother; Cancer in her father and paternal grandfather; Mental illness in her maternal aunt; and Stroke in her maternal aunt. Allergies  Allergen Reactions  . Codeine     GI upset  . Morphine And Related     "In a scarry movie" weird thoughts      Review of Systems  Constitutional: Negative for fatigue.  Eyes: Negative for visual disturbance.  Respiratory: Negative for cough, chest tightness, shortness of breath and wheezing.   Cardiovascular: Negative for chest pain, palpitations and leg swelling.  Neurological: Negative for dizziness, seizures, syncope, weakness, light-headedness and headaches.       Objective:   Physical Exam  Constitutional: She appears well-developed and well-nourished.  Neck: Neck supple. No thyromegaly present.  Cardiovascular: Normal rate and regular rhythm.   Pulmonary/Chest: Effort normal and breath sounds normal. No  respiratory distress. She has no wheezes. She has no rales.  Musculoskeletal: She exhibits no edema.  Lymphadenopathy:    She has no cervical adenopathy.  Skin:  Patient is a couple small well-demarcated brownish symmetric slightly raised lesions anterior chest between bra line          Assessment & Plan:  #1 hypertension. Improved and at goal #2 depression. Improved. Continue sertraline 75 mg daily #3 irritated seborrheic keratoses. Discussed risk and benefits of treatment with liquid nitrogen and patient consented

## 2013-01-09 ENCOUNTER — Telehealth: Payer: Self-pay | Admitting: Family Medicine

## 2013-01-09 MED ORDER — METOCLOPRAMIDE HCL 10 MG PO TABS: 10.0000 mg | ORAL_TABLET | Freq: Four times a day (QID) | ORAL | Status: DC

## 2013-01-09 MED ORDER — LOSARTAN POTASSIUM 100 MG PO TABS: 100.0000 mg | ORAL_TABLET | Freq: Every day | ORAL | Status: DC

## 2013-01-09 MED ORDER — SERTRALINE HCL 100 MG PO TABS: 100.0000 mg | ORAL_TABLET | Freq: Every day | ORAL | Status: DC

## 2013-01-09 NOTE — Telephone Encounter (Signed)
Pt informed Rx sent electronically

## 2013-01-09 NOTE — Telephone Encounter (Signed)
Pt is switching pharmaceutical care to [facility]. The address is [address] [place], [place], [address]. She stated that we have to include her address on every order. She would like her losartan (COZAAR) 100 MG tablet, sertraline (ZOLOFT) 50 MG tablet, and metoCLOPramide (REGLAN) 10 MG tablet. She is also requesting that her Zoloft be increased to 100mg  a day, as she is already taking 75mg  a day. Please assist.

## 2013-01-09 NOTE — Telephone Encounter (Signed)
Pt requesting med dose change on Zoloft

## 2013-01-09 NOTE — Telephone Encounter (Signed)
OK to increase Sertraline to 100 mg daily and may refill all meds.

## 2013-01-31 ENCOUNTER — Telehealth: Payer: Self-pay | Admitting: Family Medicine

## 2013-01-31 NOTE — Telephone Encounter (Signed)
Patient Information:  Caller Name: Kimmy  Phone: [phone]  Patient: Linda Blackburn, Linda Blackburn  Gender: Female  DOB: [birth year]  Age: 62 Years  PCP: [name] (Family Practice)  Office Follow Up:  Does the office need to follow up with this patient?: No  Instructions For The Office: N/A   Symptoms  Reason For Call & Symptoms: Pt started with a red/inflamed rash under her breasts - solid under both breasts and around her arm. Pt is in [place] and not home until next week. Pt states she has continued to broken out on her ankles/hands/legs and arms. No fever. The rash is itchy.  Reviewed Health History In EMR: Yes  Reviewed Medications In EMR: Yes  Reviewed Allergies In EMR: Yes  Reviewed Surgeries / Procedures: Yes  Date of Onset of Symptoms: 01/29/2013  Guideline(s) Used:  Rash or Redness - Widespread  Disposition Per Guideline:   See Today in Office  Reason For Disposition Reached:   Severe itching  Advice Given:  N/A  RN Overrode Recommendation:  Go To [facility]  Pt is in [place] currently/will go to [facility]

## 2013-03-19 ENCOUNTER — Other Ambulatory Visit: Payer: Self-pay

## 2013-03-19 DIAGNOSIS — Z1231 Encounter for screening mammogram for malignant neoplasm of breast: Secondary | ICD-10-CM

## 2013-04-22 ENCOUNTER — Ambulatory Visit
Admission: RE | Admit: 2013-04-22 | Discharge: 2013-04-22 | Disposition: A | Discharge status: Home / Self Care | Source: Ambulatory Visit

## 2013-04-22 DIAGNOSIS — Z1231 Encounter for screening mammogram for malignant neoplasm of breast: Secondary | ICD-10-CM

## 2013-07-09 ENCOUNTER — Other Ambulatory Visit: Payer: BC Managed Care – PPO

## 2013-07-10 ENCOUNTER — Other Ambulatory Visit: Payer: Self-pay

## 2013-07-10 ENCOUNTER — Other Ambulatory Visit (INDEPENDENT_AMBULATORY_CARE_PROVIDER_SITE_OTHER)

## 2013-07-10 DIAGNOSIS — Z0000 Encounter for general adult medical examination without abnormal findings: Secondary | ICD-10-CM

## 2013-07-10 LAB — CBC WITH DIFFERENTIAL/PLATELET
Basophils Absolute: 0.1 10*3/uL (ref 0.0–0.1)
Basophils Relative: 1.3 % (ref 0.0–3.0)
Eosinophils Absolute: 0.2 10*3/uL (ref 0.0–0.7)
Eosinophils Relative: 3.3 % (ref 0.0–5.0)
HCT: 42.5 % (ref 36.0–46.0)
Hemoglobin: 14.6 g/dL (ref 12.0–15.0)
Lymphocytes Relative: 43.5 % (ref 12.0–46.0)
Lymphs Abs: 2.2 10*3/uL (ref 0.7–4.0)
MCHC: 34.3 g/dL (ref 30.0–36.0)
MCV: 87.9 fl (ref 78.0–100.0)
Monocytes Absolute: 0.3 10*3/uL (ref 0.1–1.0)
Monocytes Relative: 6.2 % (ref 3.0–12.0)
Neutro Abs: 2.3 10*3/uL (ref 1.4–7.7)
Neutrophils Relative %: 45.7 % (ref 43.0–77.0)
Platelets: 252 10*3/uL (ref 150.0–400.0)
RBC: 4.83 Mil/uL (ref 3.87–5.11)
RDW: 13.8 % (ref 11.5–14.6)
WBC: 5 10*3/uL (ref 4.5–10.5)

## 2013-07-10 LAB — LIPID PANEL
Cholesterol: 218 mg/dL — ABNORMAL HIGH (ref 0–200)
HDL: 45.2 mg/dL (ref >39.00)
Total CHOL/HDL Ratio: 5

## 2013-07-10 LAB — POCT URINALYSIS DIPSTICK
Ketones, UA: NEGATIVE
Leukocytes, UA: NEGATIVE
Nitrite, UA: NEGATIVE
Spec Grav, UA: >=1.03
Urobilinogen, UA: 0.2
pH, UA: 5

## 2013-07-10 LAB — TSH: TSH: 1.34 u[IU]/mL (ref 0.35–5.50)

## 2013-07-10 LAB — LDL CHOLESTEROL, DIRECT: Direct LDL: 153.9 mg/dL

## 2013-07-10 LAB — HEPATIC FUNCTION PANEL
ALT: 34 U/L (ref 0–35)
AST: 31 U/L (ref 0–37)
Albumin: 4.1 g/dL (ref 3.5–5.2)
Alkaline Phosphatase: 67 U/L (ref 39–117)
Bilirubin, Direct: 0 mg/dL (ref 0.0–0.3)
Total Bilirubin: 0.6 mg/dL (ref 0.3–1.2)
Total Protein: 7.4 g/dL (ref 6.0–8.3)

## 2013-07-10 LAB — BASIC METABOLIC PANEL
BUN: 15 mg/dL (ref 6–23)
GFR: 102.83 mL/min (ref >60.00)
Potassium: 3.7 mEq/L (ref 3.5–5.1)
Sodium: 139 mEq/L (ref 135–145)

## 2013-07-16 ENCOUNTER — Encounter: Payer: Self-pay | Admitting: Family Medicine

## 2013-07-16 ENCOUNTER — Ambulatory Visit (INDEPENDENT_AMBULATORY_CARE_PROVIDER_SITE_OTHER): Admitting: Family Medicine

## 2013-07-16 VITALS — BP 134/76 | HR 76 | Temp 97.9°F | Ht 60.0 in | Wt 204.0 lb

## 2013-07-16 DIAGNOSIS — Z0000 Encounter for general adult medical examination without abnormal findings: Secondary | ICD-10-CM

## 2013-07-16 DIAGNOSIS — Z23 Encounter for immunization: Secondary | ICD-10-CM

## 2013-07-16 MED ORDER — LOSARTAN POTASSIUM 100 MG PO TABS: 100.0000 mg | ORAL_TABLET | Freq: Every day | ORAL | Status: DC

## 2013-07-16 MED ORDER — SERTRALINE HCL 100 MG PO TABS: 100.0000 mg | ORAL_TABLET | Freq: Every day | ORAL | Status: DC

## 2013-07-16 NOTE — Patient Instructions (Signed)
Try to lose some weight. We will give your pneumovax next year.

## 2013-07-16 NOTE — Progress Notes (Signed)
Pre visit review using our clinic review tool, if applicable. No additional management support is needed unless otherwise documented below in the visit note. 

## 2013-07-16 NOTE — Progress Notes (Signed)
Subjective:    Patient ID: Linda Blackburn, female    DOB: [birth year], 62 y.o.   MRN: [id number]  HPI Patient here for complete physical She's had previous hysterectomy. Mammogram is up to date. Immunizations are up to date. Does still need flu vaccine. We'll need Pneumovax on next year. Hypertension which is been well controlled. She remains on sertraline for depression and that is doing well on improved since last visit. She has unfortunately gained some weight since last year. She's not exercising. Poor dietary compliance. She has declined colonoscopies  Past Medical History  Diagnosis Date  . Depression   . Pancreatitis   . Hay fever   . Urine incontinence   . UTI (urinary tract infection)     reoccuring   . DEPRESSION 04/01/2010  . PANCREATITIS, HX OF 04/01/2010   Past Surgical History  Procedure Laterality Date  . Appendectomy    . Cholecystectomy    . Tonsillectomy    . Abdominal hysterectomy      fibroids    reports that she has never smoked. She does not have any smokeless tobacco history on file. She reports that she drinks alcohol. She reports that she does not use illicit drugs. family history includes Alcohol abuse in her maternal uncle and mother; Cancer in her father and paternal grandfather; Mental illness in her maternal aunt; Stroke in her maternal aunt. Allergies  Allergen Reactions  . Codeine     GI upset  . Morphine And Related     "In a scarry movie" weird thoughts      Review of Systems  Constitutional: Negative for fever, activity change, appetite change, fatigue and unexpected weight change.  HENT: Negative for ear pain, hearing loss, sore throat and trouble swallowing.   Eyes: Negative for visual disturbance.  Respiratory: Negative for cough and shortness of breath.   Cardiovascular: Negative for chest pain and palpitations.  Gastrointestinal: Negative for abdominal pain, diarrhea, constipation and blood in stool.  Endocrine: Negative for  polydipsia and polyuria.  Genitourinary: Negative for dysuria and hematuria.  Musculoskeletal: Negative for arthralgias, back pain and myalgias.  Skin: Negative for rash.  Neurological: Negative for dizziness, syncope and headaches.  Hematological: Negative for adenopathy.  Psychiatric/Behavioral: Negative for confusion and dysphoric mood.       Objective:   Physical Exam  Constitutional: She is oriented to person, place, and time. She appears well-developed and well-nourished.  HENT:  Head: Normocephalic and atraumatic.  Eyes: EOM are normal. Pupils are equal, round, and reactive to light.  Neck: Normal range of motion. Neck supple. No thyromegaly present.  Cardiovascular: Normal rate, regular rhythm and normal heart sounds.   No murmur heard. Pulmonary/Chest: Breath sounds normal. No respiratory distress. She has no wheezes. She has no rales.  Abdominal: Soft. Bowel sounds are normal. She exhibits no distension and no mass. There is no tenderness. There is no rebound and no guarding.  Genitourinary:  Breasts are symmetric with no mass. Pelvic deferred as she's had previous hysterectomy  Musculoskeletal: Normal range of motion. She exhibits no edema.  Lymphadenopathy:    She has no cervical adenopathy.  Neurological: She is alert and oriented to person, place, and time. She displays normal reflexes. No cranial nerve deficit.  Skin: No rash noted.  Psychiatric: She has a normal mood and affect. Her behavior is normal. Judgment and thought content normal.          Assessment & Plan:  Complete physical. Colonoscopy recommended and declined. Labs reviewed with  no major concerns. She needs to lose some weight. Flu vaccine given. Pap smear not indicated with previous hysterectomy for benign disease. Pneumovax on next year.

## 2013-09-25 ENCOUNTER — Other Ambulatory Visit: Payer: Self-pay | Admitting: Family Medicine

## 2014-01-13 DIAGNOSIS — H2510 Age-related nuclear cataract, unspecified eye: Secondary | ICD-10-CM | POA: Diagnosis not present

## 2014-03-30 ENCOUNTER — Other Ambulatory Visit: Payer: Self-pay

## 2014-03-30 DIAGNOSIS — Z1231 Encounter for screening mammogram for malignant neoplasm of breast: Secondary | ICD-10-CM

## 2014-04-23 ENCOUNTER — Ambulatory Visit
Admission: RE | Admit: 2014-04-23 | Discharge: 2014-04-23 | Disposition: A | Discharge status: Home / Self Care | Payer: Medicare Other | Source: Ambulatory Visit

## 2014-04-23 DIAGNOSIS — Z1231 Encounter for screening mammogram for malignant neoplasm of breast: Secondary | ICD-10-CM

## 2014-06-06 ENCOUNTER — Inpatient Hospital Stay (HOSPITAL_COMMUNITY): Payer: Medicare Other

## 2014-06-06 ENCOUNTER — Encounter (HOSPITAL_COMMUNITY): Payer: Self-pay | Admitting: Emergency Medicine

## 2014-06-06 ENCOUNTER — Inpatient Hospital Stay (HOSPITAL_COMMUNITY)
Admission: EM | Admit: 2014-06-06 | Discharge: 2014-06-12 | DRG: 438 | Disposition: A | Discharge status: Home / Self Care | Payer: Medicare Other | Attending: Internal Medicine | Admitting: Internal Medicine

## 2014-06-06 DIAGNOSIS — Q453 Other congenital malformations of pancreas and pancreatic duct: Secondary | ICD-10-CM | POA: Diagnosis not present

## 2014-06-06 DIAGNOSIS — Z9049 Acquired absence of other specified parts of digestive tract: Secondary | ICD-10-CM | POA: Diagnosis present

## 2014-06-06 DIAGNOSIS — N390 Urinary tract infection, site not specified: Secondary | ICD-10-CM

## 2014-06-06 DIAGNOSIS — R1010 Upper abdominal pain, unspecified: Secondary | ICD-10-CM | POA: Diagnosis not present

## 2014-06-06 DIAGNOSIS — K858 Other acute pancreatitis without necrosis or infection: Secondary | ICD-10-CM

## 2014-06-06 DIAGNOSIS — I959 Hypotension, unspecified: Secondary | ICD-10-CM | POA: Diagnosis present

## 2014-06-06 DIAGNOSIS — E861 Hypovolemia: Secondary | ICD-10-CM | POA: Diagnosis present

## 2014-06-06 DIAGNOSIS — Z885 Allergy status to narcotic agent status: Secondary | ICD-10-CM | POA: Diagnosis not present

## 2014-06-06 DIAGNOSIS — F329 Major depressive disorder, single episode, unspecified: Secondary | ICD-10-CM | POA: Diagnosis present

## 2014-06-06 DIAGNOSIS — Z823 Family history of stroke: Secondary | ICD-10-CM

## 2014-06-06 DIAGNOSIS — N179 Acute kidney failure, unspecified: Secondary | ICD-10-CM | POA: Diagnosis not present

## 2014-06-06 DIAGNOSIS — Y95 Nosocomial condition: Secondary | ICD-10-CM | POA: Diagnosis not present

## 2014-06-06 DIAGNOSIS — E860 Dehydration: Secondary | ICD-10-CM | POA: Diagnosis present

## 2014-06-06 DIAGNOSIS — Z809 Family history of malignant neoplasm, unspecified: Secondary | ICD-10-CM

## 2014-06-06 DIAGNOSIS — N170 Acute kidney failure with tubular necrosis: Secondary | ICD-10-CM | POA: Diagnosis present

## 2014-06-06 DIAGNOSIS — J918 Pleural effusion in other conditions classified elsewhere: Secondary | ICD-10-CM | POA: Diagnosis not present

## 2014-06-06 DIAGNOSIS — R0600 Dyspnea, unspecified: Secondary | ICD-10-CM

## 2014-06-06 DIAGNOSIS — I10 Essential (primary) hypertension: Secondary | ICD-10-CM | POA: Diagnosis present

## 2014-06-06 DIAGNOSIS — J9811 Atelectasis: Secondary | ICD-10-CM | POA: Diagnosis not present

## 2014-06-06 DIAGNOSIS — K861 Other chronic pancreatitis: Secondary | ICD-10-CM | POA: Diagnosis present

## 2014-06-06 DIAGNOSIS — Z79899 Other long term (current) drug therapy: Secondary | ICD-10-CM

## 2014-06-06 DIAGNOSIS — J9601 Acute respiratory failure with hypoxia: Secondary | ICD-10-CM | POA: Diagnosis not present

## 2014-06-06 DIAGNOSIS — J189 Pneumonia, unspecified organism: Secondary | ICD-10-CM

## 2014-06-06 DIAGNOSIS — E8770 Fluid overload, unspecified: Secondary | ICD-10-CM | POA: Diagnosis not present

## 2014-06-06 DIAGNOSIS — K5730 Diverticulosis of large intestine without perforation or abscess without bleeding: Secondary | ICD-10-CM | POA: Diagnosis not present

## 2014-06-06 DIAGNOSIS — K859 Acute pancreatitis without necrosis or infection, unspecified: Secondary | ICD-10-CM | POA: Diagnosis present

## 2014-06-06 DIAGNOSIS — N3000 Acute cystitis without hematuria: Secondary | ICD-10-CM

## 2014-06-06 DIAGNOSIS — R1013 Epigastric pain: Secondary | ICD-10-CM | POA: Diagnosis not present

## 2014-06-06 DIAGNOSIS — E876 Hypokalemia: Secondary | ICD-10-CM | POA: Diagnosis present

## 2014-06-06 DIAGNOSIS — D72829 Elevated white blood cell count, unspecified: Secondary | ICD-10-CM

## 2014-06-06 LAB — CBC WITH DIFFERENTIAL/PLATELET
BASOS PCT: 0 % (ref 0–1)
Basophils Absolute: 0 10*3/uL (ref 0.0–0.1)
EOS ABS: 0.1 10*3/uL (ref 0.0–0.7)
Eosinophils Relative: 1 % (ref 0–5)
HEMATOCRIT: 42.9 % (ref 36.0–46.0)
HEMOGLOBIN: 14.4 g/dL (ref 12.0–15.0)
Lymphocytes Relative: 22 % (ref 12–46)
Lymphs Abs: 2.5 10*3/uL (ref 0.7–4.0)
MCH: 30.2 pg (ref 26.0–34.0)
MCHC: 33.6 g/dL (ref 30.0–36.0)
MCV: 89.9 fL (ref 78.0–100.0)
MONO ABS: 0.8 10*3/uL (ref 0.1–1.0)
MONOS PCT: 7 % (ref 3–12)
Neutro Abs: 7.9 10*3/uL — ABNORMAL HIGH (ref 1.7–7.7)
Neutrophils Relative %: 70 % (ref 43–77)
Platelets: 212 10*3/uL (ref 150–400)
RBC: 4.77 MIL/uL (ref 3.87–5.11)
RDW: 13.2 % (ref 11.5–15.5)
WBC: 11.3 10*3/uL — ABNORMAL HIGH (ref 4.0–10.5)

## 2014-06-06 LAB — COMPREHENSIVE METABOLIC PANEL
ALT: 29 U/L (ref 0–35)
ANION GAP: 13 (ref 5–15)
AST: 29 U/L (ref 0–37)
Albumin: 3.9 g/dL (ref 3.5–5.2)
Alkaline Phosphatase: 77 U/L (ref 39–117)
BILIRUBIN TOTAL: 0.4 mg/dL (ref 0.3–1.2)
BUN: 16 mg/dL (ref 6–23)
CHLORIDE: 106 meq/L (ref 96–112)
CO2: 21 mEq/L (ref 19–32)
CREATININE: 0.77 mg/dL (ref 0.50–1.10)
Calcium: 9.1 mg/dL (ref 8.4–10.5)
GFR calc Af Amer: >90 mL/min (ref >90)
GFR calc non Af Amer: 86 mL/min — ABNORMAL LOW (ref >90)
Glucose, Bld: 116 mg/dL — ABNORMAL HIGH (ref 70–99)
Potassium: 3.8 mEq/L (ref 3.7–5.3)
Sodium: 140 mEq/L (ref 137–147)
TOTAL PROTEIN: 7.1 g/dL (ref 6.0–8.3)

## 2014-06-06 LAB — TROPONIN I: Troponin I: <0.3 ng/mL (ref <0.30)

## 2014-06-06 LAB — GLUCOSE, CAPILLARY
GLUCOSE-CAPILLARY: 90 mg/dL (ref 70–99)
GLUCOSE-CAPILLARY: 99 mg/dL (ref 70–99)
Glucose-Capillary: 99 mg/dL (ref 70–99)
Glucose-Capillary: 99 mg/dL (ref 70–99)

## 2014-06-06 LAB — LIPASE, BLOOD: Lipase: >3000 U/L — ABNORMAL HIGH (ref 11–59)

## 2014-06-06 MED ORDER — HEPARIN SODIUM (PORCINE) 5000 UNIT/ML IJ SOLN
5000.0000 [IU] | Freq: Three times a day (TID) | INTRAMUSCULAR | Status: DC
  Administered 2014-06-06 – 2014-06-12 (×19): 5000 [IU] via SUBCUTANEOUS
  Filled 2014-06-06 (×22): qty 1

## 2014-06-06 MED ORDER — HYDROMORPHONE HCL 1 MG/ML IJ SOLN: 1.0000 mg | INTRAMUSCULAR | Status: DC | PRN

## 2014-06-06 MED ORDER — SODIUM CHLORIDE 0.9 % IV SOLN
INTRAVENOUS | Status: DC
  Administered 2014-06-06: 04:00:00 via INTRAVENOUS

## 2014-06-06 MED ORDER — SODIUM CHLORIDE 0.9 % IV SOLN: INTRAVENOUS | Status: DC

## 2014-06-06 MED ORDER — LORATADINE 10 MG PO TABS
10.0000 mg | ORAL_TABLET | Freq: Every day | ORAL | Status: DC
  Administered 2014-06-10 – 2014-06-12 (×3): 10 mg via ORAL
  Filled 2014-06-06 (×7): qty 1

## 2014-06-06 MED ORDER — LOSARTAN POTASSIUM 50 MG PO TABS
100.0000 mg | ORAL_TABLET | Freq: Every day | ORAL | Status: DC
Start: 2014-06-06 — End: 2014-06-07
  Administered 2014-06-06: 100 mg via ORAL
  Filled 2014-06-06 (×2): qty 2

## 2014-06-06 MED ORDER — ONDANSETRON HCL 4 MG/2ML IJ SOLN
4.0000 mg | Freq: Four times a day (QID) | INTRAMUSCULAR | Status: DC | PRN
  Administered 2014-06-06 – 2014-06-12 (×12): 4 mg via INTRAVENOUS
  Filled 2014-06-06 (×12): qty 2

## 2014-06-06 MED ORDER — SERTRALINE HCL 100 MG PO TABS
100.0000 mg | ORAL_TABLET | Freq: Every day | ORAL | Status: DC
  Administered 2014-06-06 – 2014-06-12 (×7): 100 mg via ORAL
  Filled 2014-06-06 (×7): qty 1

## 2014-06-06 MED ORDER — HYDROMORPHONE HCL 2 MG/ML IJ SOLN
2.0000 mg | INTRAMUSCULAR | Status: DC | PRN
  Administered 2014-06-06 – 2014-06-09 (×14): 2 mg via INTRAVENOUS
  Filled 2014-06-06 (×14): qty 1

## 2014-06-06 MED ORDER — FAMOTIDINE 20 MG PO TABS
20.0000 mg | ORAL_TABLET | Freq: Every day | ORAL | Status: DC | PRN
  Administered 2014-06-11: 20 mg via ORAL
  Filled 2014-06-06: qty 1

## 2014-06-06 MED ORDER — SODIUM CHLORIDE 0.9 % IV BOLUS (SEPSIS)
1000.0000 mL | Freq: Once | INTRAVENOUS | Status: AC
  Administered 2014-06-06: 1000 mL via INTRAVENOUS

## 2014-06-06 MED ORDER — SODIUM CHLORIDE 0.9 % IV BOLUS (SEPSIS)
500.0000 mL | Freq: Once | INTRAVENOUS | Status: AC
  Administered 2014-06-06: 500 mL via INTRAVENOUS

## 2014-06-06 MED ORDER — CETYLPYRIDINIUM CHLORIDE 0.05 % MT LIQD
7.0000 mL | Freq: Two times a day (BID) | OROMUCOSAL | Status: DC
  Administered 2014-06-06 – 2014-06-12 (×9): 7 mL via OROMUCOSAL

## 2014-06-06 MED ORDER — SODIUM CHLORIDE 0.9 % IV SOLN
INTRAVENOUS | Status: DC
  Administered 2014-06-06: 15:00:00 via INTRAVENOUS

## 2014-06-06 MED ORDER — HYDROMORPHONE HCL 1 MG/ML IJ SOLN
0.5000 mg | INTRAMUSCULAR | Status: DC | PRN
  Administered 2014-06-06: 1 mg via INTRAVENOUS
  Filled 2014-06-06 (×2): qty 1

## 2014-06-06 MED ORDER — HYDROMORPHONE HCL 1 MG/ML IJ SOLN
1.0000 mg | Freq: Once | INTRAMUSCULAR | Status: AC
  Administered 2014-06-06: 1 mg via INTRAVENOUS
  Filled 2014-06-06: qty 1

## 2014-06-06 MED ORDER — ONDANSETRON HCL 4 MG/2ML IJ SOLN: 4.0000 mg | Freq: Three times a day (TID) | INTRAMUSCULAR | Status: DC | PRN

## 2014-06-06 MED ORDER — HYDRALAZINE HCL 20 MG/ML IJ SOLN: 5.0000 mg | Freq: Four times a day (QID) | INTRAMUSCULAR | Status: DC | PRN

## 2014-06-06 MED ORDER — HYDROMORPHONE HCL 1 MG/ML IJ SOLN
0.5000 mg | Freq: Once | INTRAMUSCULAR | Status: AC
  Administered 2014-06-06: 0.5 mg via INTRAVENOUS
  Filled 2014-06-06: qty 1

## 2014-06-06 MED ORDER — ONDANSETRON HCL 4 MG/2ML IJ SOLN
4.0000 mg | Freq: Once | INTRAMUSCULAR | Status: AC
  Administered 2014-06-06: 4 mg via INTRAVENOUS
  Filled 2014-06-06: qty 2

## 2014-06-06 NOTE — Progress Notes (Signed)
Pt has had low output today. Recently bladder scanned with 46 ml urine resulting in bladder. No distention nor discomfort noted. Dr. Coralyn Pear notified via text regarding urinary status. Next shift RN aware.

## 2014-06-06 NOTE — H&P (Signed)
[facility] History and Physical  [name] WNI:[id number] DOB: 1951-04-22 DOA: 06/06/2014  Referring physician: EDP PCP: [name], MD   Chief Complaint: Abdominal pain   HPI: Linda Blackburn is a 63 y.o. female with h/o recurrent pancreatitis due to pancreas divisum who presents to the ED with c/o abdominal pain.  Located in her epigastric area, onset around 10 pm tonight, severe, associated with n/v, feels like all her other prior pancreatitis episodes she gets every 3-4 years for the past 25+ years.  Lipase is over 3000, remainder of workup unremarkable.  Review of Systems: Systems reviewed.  As above, otherwise negative  Past Medical History  Diagnosis Date  . Depression   . Pancreatitis   . Hay fever   . Urine incontinence   . UTI (urinary tract infection)     reoccuring   . DEPRESSION 04/01/2010  . PANCREATITIS, HX OF 04/01/2010   Past Surgical History  Procedure Laterality Date  . Appendectomy    . Cholecystectomy    . Tonsillectomy    . Abdominal hysterectomy      fibroids   Social History:  reports that she has never smoked. She does not have any smokeless tobacco history on file. She reports that she drinks alcohol. She reports that she does not use illicit drugs.  Allergies  Allergen Reactions  . Codeine     GI upset  . Morphine And Related     "In a scarry movie" weird thoughts    Family History  Problem Relation Age of Onset  . Alcohol abuse Mother   . Cancer Father     prostate  . Stroke Maternal Aunt   . Mental illness Maternal Aunt   . Alcohol abuse Maternal Uncle   . Cancer Paternal Grandfather     prostate     Prior to Admission medications   Medication Sig Start Date End Date Taking? Authorizing Provider  cetirizine (ZYRTEC) 10 MG tablet Take 10 mg by mouth daily as needed for allergies.    Yes Historical Provider, MD  famotidine (PEPCID) 20 MG tablet Take 20 mg by mouth daily as needed for heartburn.    Yes Historical  Provider, MD  losartan (COZAAR) 100 MG tablet Take 1 tablet (100 mg total) by mouth daily. 07/16/13  Yes [name], MD  sertraline (ZOLOFT) 100 MG tablet Take 1 tablet (100 mg total) by mouth daily. 07/16/13  Yes [name], MD  zolpidem (AMBIEN) 5 MG tablet Take 1 tablet (5 mg total) by mouth at bedtime as needed for sleep. 10/04/12   [name], MD   Physical Exam: Filed Vitals:   06/06/14 0044  BP: 91/56  Pulse: 56  Temp: 98.4 F (36.9 C)    BP 91/56  Pulse 56  Temp(Src) 98.4 F (36.9 C) (Oral)  Ht 5\' 1"  (1.549 m)  Wt 83.915 kg (185 lb)  BMI 34.97 kg/m2  SpO2 97%  General Appearance:    Alert, oriented, no distress, appears stated age  Head:    Normocephalic, atraumatic  Eyes:    PERRL, EOMI, sclera non-icteric        Nose:   Nares without drainage or epistaxis. Mucosa, turbinates normal  Throat:   Moist mucous membranes. Oropharynx without erythema or exudate.  Neck:   Supple. No carotid bruits.  No thyromegaly.  No lymphadenopathy.   Back:     No CVA tenderness, no spinal tenderness  Lungs:     Clear to auscultation bilaterally, without wheezes,  rhonchi or rales  Chest wall:    No tenderness to palpitation  Heart:    Regular rate and rhythm without murmurs, gallops, rubs  Abdomen:     Soft, epigastric tenderness, nondistended, normal bowel sounds, no organomegaly  Genitalia:    deferred  Rectal:    deferred  Extremities:   No clubbing, cyanosis or edema.  Pulses:   2+ and symmetric all extremities  Skin:   Skin color, texture, turgor normal, no rashes or lesions  Lymph nodes:   Cervical, supraclavicular, and axillary nodes normal  Neurologic:   CNII-XII intact. Normal strength, sensation and reflexes      throughout    Labs on Admission:  Basic Metabolic Panel:  Recent Labs Lab 06/06/14 0137  NA 140  K 3.8  CL 106  CO2 21  GLUCOSE 116*  BUN 16  CREATININE 0.77  CALCIUM 9.1   Liver Function Tests:  Recent Labs Lab 06/06/14 0137   AST 29  ALT 29  ALKPHOS 77  BILITOT 0.4  PROT 7.1  ALBUMIN 3.9    Recent Labs Lab 06/06/14 0137  LIPASE >3000*   No results found for this basename: AMMONIA,  in the last 168 hours CBC:  Recent Labs Lab 06/06/14 0137  WBC 11.3*  NEUTROABS 7.9*  HGB 14.4  HCT 42.9  MCV 89.9  PLT 212   Cardiac Enzymes:  Recent Labs Lab 06/06/14 0137  TROPONINI <0.30    BNP (last 3 results) No results found for this basename: PROBNP,  in the last 8760 hours CBG: No results found for this basename: GLUCAP,  in the last 168 hours  Radiological Exams on Admission: No results found.  EKG: Independently reviewed.  Assessment/Plan Principal Problem:   Acute recurrent pancreatitis Active Problems:   Pancreas divisum   Pancreatitis   1. Acute recurrent pancreatitis due to pancreas divisum - 1. IVF 2. NPO 3. Pain control 4. US abdomen to evaluate further, already had cholecystectomy in past, has had minor papilla worked on by GI in the past where they tried to "open it up", dosent seem to have helped though. 5. Thankfully initial ranson's = 0, and despite very high lipases in the past she has never had to be put on life support she states.    Code Status: Full Code  Family Communication: Friend at bedside Disposition Plan: Admit to inpatient   Time spent: 70 min  GARDNER, JARED M. [facility] Pager [phone]  If 7AM-7PM, please contact the day team taking care of the patient [email or web address] Password [email or web address] 06/06/2014, 3:03 AM

## 2014-06-06 NOTE — ED Notes (Addendum)
Pt does not have to urinate at this time.  

## 2014-06-06 NOTE — ED Provider Notes (Signed)
CSN: [id number]     Arrival date & time 06/06/14  0041 History   First MD Initiated Contact with Patient 06/06/14 0055     Chief Complaint  Patient presents with  . Abdominal Pain     (Consider location/radiation/quality/duration/timing/severity/associated sxs/prior Treatment) HPI Patient presents with upper abdominal pain for the past 3 hours. Associated with multiple episodes of nausea and vomiting. She states she has recurrent pancreatitis and the symptoms are similar to past episodes. She's had no fever or chills. No diarrhea or constipation. Patient denies any chest pain. Past Medical History  Diagnosis Date  . Depression   . Pancreatitis   . Hay fever   . Urine incontinence   . UTI (urinary tract infection)     reoccuring   . DEPRESSION 04/01/2010  . PANCREATITIS, HX OF 04/01/2010   Past Surgical History  Procedure Laterality Date  . Appendectomy    . Cholecystectomy    . Tonsillectomy    . Abdominal hysterectomy      fibroids   Family History  Problem Relation Age of Onset  . Alcohol abuse Mother   . Cancer Father     prostate  . Stroke Maternal Aunt   . Mental illness Maternal Aunt   . Alcohol abuse Maternal Uncle   . Cancer Paternal Grandfather     prostate   History  Substance Use Topics  . Smoking status: Never Smoker   . Smokeless tobacco: Not on file  . Alcohol Use: Yes     Comment: occ   OB History   Grav Para Term Preterm Abortions TAB SAB Ect Mult Living                 Review of Systems  Constitutional: Negative for fever and chills.  Respiratory: Negative for cough and shortness of breath.   Cardiovascular: Negative for chest pain.  Gastrointestinal: Positive for nausea, vomiting and abdominal pain. Negative for diarrhea, constipation and blood in stool.  Genitourinary: Negative for dysuria and flank pain.  Musculoskeletal: Negative for back pain, myalgias, neck pain and neck stiffness.  Skin: Negative for rash and wound.  Neurological:  Negative for dizziness, weakness, light-headedness, numbness and headaches.  All other systems reviewed and are negative.     Allergies  Codeine and Morphine and related  Home Medications   Prior to Admission medications   Medication Sig Start Date End Date Taking? Authorizing Provider  cetirizine (ZYRTEC) 10 MG tablet Take 10 mg by mouth daily as needed for allergies.    Yes Historical Provider, MD  famotidine (PEPCID) 20 MG tablet Take 20 mg by mouth daily as needed for heartburn.    Yes Historical Provider, MD  losartan (COZAAR) 100 MG tablet Take 1 tablet (100 mg total) by mouth daily. 07/16/13  Yes [name], MD  sertraline (ZOLOFT) 100 MG tablet Take 1 tablet (100 mg total) by mouth daily. 07/16/13  Yes [name], MD  zolpidem (AMBIEN) 5 MG tablet Take 1 tablet (5 mg total) by mouth at bedtime as needed for sleep. 10/04/12   [name], MD   BP 91/56  Pulse 56  Temp(Src) 98.4 F (36.9 C) (Oral)  Ht 5\' 1"  (1.549 m)  Wt 185 lb (83.915 kg)  BMI 34.97 kg/m2  SpO2 97% Physical Exam  Nursing note and vitals reviewed. Constitutional: She is oriented to person, place, and time. She appears well-developed and well-nourished. She appears distressed.  HENT:  Head: Normocephalic and atraumatic.  Mouth/Throat: Oropharynx is  clear and moist.  Eyes: EOM are normal. Pupils are equal, round, and reactive to light.  Neck: Normal range of motion. Neck supple.  Cardiovascular: Normal rate and regular rhythm.   Pulmonary/Chest: Effort normal and breath sounds normal. No respiratory distress. She has no wheezes. She has no rales.  Abdominal: Soft. Bowel sounds are normal. She exhibits no distension and no mass. There is tenderness (diffuse upper abdominal tenderness to palpation. No definite rebound or guarding.). There is no rebound and no guarding.  Musculoskeletal: Normal range of motion. She exhibits no edema and no tenderness.  Neurological: She is alert and oriented  to person, place, and time.  Skin: Skin is warm and dry. No rash noted. No erythema.  Psychiatric: She has a normal mood and affect. Her behavior is normal.    ED Course  Procedures (including critical care time) Labs Review Labs Reviewed  CBC WITH DIFFERENTIAL - Abnormal; Notable for the following:    WBC 11.3 (*)    Neutro Abs 7.9 (*)    All other components within normal limits  COMPREHENSIVE METABOLIC PANEL - Abnormal; Notable for the following:    Glucose, Bld 116 (*)    GFR calc non Af Amer 86 (*)    All other components within normal limits  LIPASE, BLOOD - Abnormal; Notable for the following:    Lipase >3000 (*)    All other components within normal limits  TROPONIN I  URINALYSIS, ROUTINE W REFLEX MICROSCOPIC    Imaging Review No results found.   EKG Interpretation None      MDM   Final diagnoses:  Acute recurrent pancreatitis    Discuss with Dr. Alcario Drought and will admit to hospitalist service.     Julianne Rice, MD 06/06/14 [phone]

## 2014-06-06 NOTE — Progress Notes (Signed)
[facility] PROGRESS NOTE  Linda Blackburn MCN:[id number] DOB: 01/08/1951 DOA: 06/06/2014 PCP: [name], MD  Assessment/Plan: 1. Acute pancreatitis -Patient presenting with severe abdominal pain, found to have lipase greater than 3000. She has a history of cholecystectomy. -Abdominal ultrasound confirm gallbladder surgically absent, normal appearance of the liver and bile ducts. Radiology reporting segmental visualization of the pancreas with suggestion of mild ductal dilatation -Continue supportive care, IV fluid administration, n.p.o., IV narcotic analgesics -Will check triglycerides  2.  Hypertension -Blood pressure stable continue losartan milligrams by mouth daily   Code Status: Full code Family Communication:  Disposition Plan: Continue supportive care anticipate she will be discharged home once medically stable   HPI/Subjective: Patient is a pleasant 63 year old female who was admitted to the medicine service on 06/06/2014 presenting with complaints of abdominal pain. She was found to have a lipase over 3000, admitted to [facility] where she was made n.p.o., administered IV fluids and as needed narcotic analgesics for acute pancreatitis.  Objective: Filed Vitals:   06/06/14 0600  BP: 111/56  Pulse: 58  Temp: 97.5 F (36.4 C)  Resp: 16    Intake/Output Summary (Last 24 hours) at 06/06/14 1605 Last data filed at 06/06/14 1400  Gross per 24 hour  Intake 651.25 ml  Output    200 ml  Net 451.25 ml   Filed Weights   06/06/14 0044  Weight: 83.915 kg (185 lb)    Exam:   General:  Patient is in no acute distress, she is awake and alert, ongoing abdominal pain  Cardiovascular: Regular rate and rhythm normal S1-S2 no murmurs rubs or gallops  Respiratory: Normal respiratory effort  Abdomen: Has generalized pain to palpation  Musculoskeletal: No edema  Data Reviewed: Basic Metabolic Panel:  Recent Labs Lab 06/06/14 0137  NA 140  K 3.8  CL 106  CO2  21  GLUCOSE 116*  BUN 16  CREATININE 0.77  CALCIUM 9.1   Liver Function Tests:  Recent Labs Lab 06/06/14 0137  AST 29  ALT 29  ALKPHOS 77  BILITOT 0.4  PROT 7.1  ALBUMIN 3.9    Recent Labs Lab 06/06/14 0137  LIPASE >3000*   No results found for this basename: AMMONIA,  in the last 168 hours CBC:  Recent Labs Lab 06/06/14 0137  WBC 11.3*  NEUTROABS 7.9*  HGB 14.4  HCT 42.9  MCV 89.9  PLT 212   Cardiac Enzymes:  Recent Labs Lab 06/06/14 0137  TROPONINI <0.30   BNP (last 3 results) No results found for this basename: PROBNP,  in the last 8760 hours CBG:  Recent Labs Lab 06/06/14 0821 06/06/14 1235  GLUCAP 99 99    No results found for this or any previous visit (from the past 240 hour(s)).   Studies: US Abdomen Complete  06/06/2014   CLINICAL DATA:  Recurrent pancreatitis.  EXAM: ULTRASOUND ABDOMEN COMPLETE  COMPARISON:  None.  FINDINGS: Gallbladder: Gallbladder is surgically absent.  Common bile duct: Diameter: 5.1 mm, normal  Liver: No focal lesion identified. Within normal limits in parenchymal echogenicity.  IVC: Not visualized due to overlying bowel gas.  Pancreas: Limited visualization due to overlying bowel gas. Segments of the head and body are demonstrated and a mildly dilated bile duct at 4 mm is appreciated. This is compatible with history of pancreatitis although nonspecific.  Spleen: Size and appearance within normal limits.  Right Kidney: Length: 10.8 cm. Echogenicity within normal limits. No mass or hydronephrosis visualized.  Left Kidney: Length: 10.5 cm. Echogenicity within  normal limits. No mass or hydronephrosis visualized.  Abdominal aorta: Proximal abdominal aorta only is visualized and is normal caliber. Mid and distal aorta is obscured by overlying bowel gas.  Other findings: None.  IMPRESSION: Gallbladder surgically absent. Normal appearance of the liver and bile ducts. Segmental visualization of the pancreas with suggestion of mild  ductal dilatation.   Electronically Signed   By: Lucienne Capers M.D.   On: 06/06/2014 06:35    Scheduled Meds: . antiseptic oral rinse  7 mL Mouth Rinse BID  . heparin  5,000 Units Subcutaneous 3 times per day  . loratadine  10 mg Oral Daily  . losartan  100 mg Oral Daily  . sertraline  100 mg Oral Daily   Continuous Infusions: . sodium chloride 125 mL/hr at 06/06/14 1527    Principal Problem:   Acute recurrent pancreatitis Active Problems:   Pancreas divisum   Pancreatitis    Time spent:     Kelvin Cellar  [facility] Pager [phone]. If 7PM-7AM, please contact night-coverage at [email or web address], password [facility] 06/06/2014, 4:05 PM  LOS: 0 days

## 2014-06-06 NOTE — ED Notes (Signed)
Pt states that she has pancreatitis and began to have pain around 10pm tonight; pt c/o N/V; pt states that this reoccurs every 3-4 years

## 2014-06-07 ENCOUNTER — Encounter (HOSPITAL_COMMUNITY): Payer: Self-pay | Admitting: Radiology

## 2014-06-07 ENCOUNTER — Inpatient Hospital Stay (HOSPITAL_COMMUNITY): Payer: Medicare Other

## 2014-06-07 DIAGNOSIS — I959 Hypotension, unspecified: Secondary | ICD-10-CM

## 2014-06-07 DIAGNOSIS — N179 Acute kidney failure, unspecified: Secondary | ICD-10-CM

## 2014-06-07 DIAGNOSIS — N3000 Acute cystitis without hematuria: Secondary | ICD-10-CM

## 2014-06-07 LAB — URINALYSIS, ROUTINE W REFLEX MICROSCOPIC
Glucose, UA: NEGATIVE mg/dL
KETONES UR: NEGATIVE mg/dL
NITRITE: NEGATIVE
PH: 5 (ref 5.0–8.0)
Protein, ur: 100 mg/dL — AB
Specific Gravity, Urine: 1.022 (ref 1.005–1.030)
UROBILINOGEN UA: 0.2 mg/dL (ref 0.0–1.0)

## 2014-06-07 LAB — URINE MICROSCOPIC-ADD ON

## 2014-06-07 LAB — COMPREHENSIVE METABOLIC PANEL
ALK PHOS: 82 U/L (ref 39–117)
ALT: 44 U/L — AB (ref 0–35)
AST: 44 U/L — AB (ref 0–37)
Albumin: 3.7 g/dL (ref 3.5–5.2)
Anion gap: 12 (ref 5–15)
BILIRUBIN TOTAL: 1.1 mg/dL (ref 0.3–1.2)
BUN: 24 mg/dL — ABNORMAL HIGH (ref 6–23)
CALCIUM: 7.9 mg/dL — AB (ref 8.4–10.5)
CHLORIDE: 109 meq/L (ref 96–112)
CO2: 21 mEq/L (ref 19–32)
Creatinine, Ser: 1.31 mg/dL — ABNORMAL HIGH (ref 0.50–1.10)
GFR calc non Af Amer: 42 mL/min — ABNORMAL LOW (ref >90)
GFR, EST AFRICAN AMERICAN: 48 mL/min — AB (ref >90)
GLUCOSE: 121 mg/dL — AB (ref 70–99)
Potassium: 4.2 mEq/L (ref 3.7–5.3)
SODIUM: 142 meq/L (ref 137–147)
Total Protein: 6.7 g/dL (ref 6.0–8.3)

## 2014-06-07 LAB — CBC
HCT: 42.4 % (ref 36.0–46.0)
Hemoglobin: 13.3 g/dL (ref 12.0–15.0)
MCH: 30 pg (ref 26.0–34.0)
MCHC: 31.4 g/dL (ref 30.0–36.0)
MCV: 95.7 fL (ref 78.0–100.0)
PLATELETS: 220 10*3/uL (ref 150–400)
RBC: 4.43 MIL/uL (ref 3.87–5.11)
RDW: 14.2 % (ref 11.5–15.5)
WBC: 14.8 10*3/uL — ABNORMAL HIGH (ref 4.0–10.5)

## 2014-06-07 LAB — LIPASE, BLOOD: LIPASE: 2084 U/L — AB (ref 11–59)

## 2014-06-07 LAB — GLUCOSE, CAPILLARY
GLUCOSE-CAPILLARY: 91 mg/dL (ref 70–99)
GLUCOSE-CAPILLARY: 93 mg/dL (ref 70–99)
Glucose-Capillary: 92 mg/dL (ref 70–99)
Glucose-Capillary: 84 mg/dL (ref 70–99)

## 2014-06-07 LAB — LACTIC ACID, PLASMA: Lactic Acid, Venous: 0.9 mmol/L (ref 0.5–2.2)

## 2014-06-07 LAB — TRIGLYCERIDES: Triglycerides: 108 mg/dL (ref <150)

## 2014-06-07 MED ORDER — SODIUM CHLORIDE 0.9 % IV BOLUS (SEPSIS)
500.0000 mL | Freq: Once | INTRAVENOUS | Status: AC
  Administered 2014-06-07: 500 mL via INTRAVENOUS

## 2014-06-07 MED ORDER — ALBUTEROL SULFATE (2.5 MG/3ML) 0.083% IN NEBU
2.5000 mg | INHALATION_SOLUTION | Freq: Once | RESPIRATORY_TRACT | Status: AC
  Administered 2014-06-07: 2.5 mg via RESPIRATORY_TRACT
  Filled 2014-06-07: qty 3

## 2014-06-07 MED ORDER — SODIUM CHLORIDE 0.9 % IV SOLN
INTRAVENOUS | Status: DC
  Administered 2014-06-07 – 2014-06-08 (×4): via INTRAVENOUS

## 2014-06-07 MED ORDER — IOHEXOL 300 MG/ML  SOLN
50.0000 mL | Freq: Once | INTRAMUSCULAR | Status: AC | PRN
  Administered 2014-06-07: 50 mL via ORAL

## 2014-06-07 MED ORDER — SODIUM CHLORIDE 0.9 % IV BOLUS (SEPSIS)
1000.0000 mL | Freq: Once | INTRAVENOUS | Status: AC
  Administered 2014-06-07: 1000 mL via INTRAVENOUS

## 2014-06-07 MED ORDER — CEFTRIAXONE SODIUM 1 G IJ SOLR
1.0000 g | INTRAMUSCULAR | Status: DC
  Administered 2014-06-07 – 2014-06-09 (×3): 1 g via INTRAVENOUS
  Filled 2014-06-07 (×3): qty 10

## 2014-06-07 MED ORDER — FUROSEMIDE 10 MG/ML IJ SOLN
20.0000 mg | Freq: Once | INTRAMUSCULAR | Status: AC
  Administered 2014-06-07: 20 mg via INTRAVENOUS
  Filled 2014-06-07: qty 2

## 2014-06-07 NOTE — Progress Notes (Signed)
Dr. Coralyn Pear in to check on pt. See new orders entered per MD.

## 2014-06-07 NOTE — Progress Notes (Signed)
Kathline Magic, NP aware via phone of pt's void status and earlier attempts to insert foley catheter. Most recent bladder scan @ 1900 was 196 ml. Pt currently refusing foley. No complaints of pressure or urgency at this time. See new orders received. Night shift nurse, Jess, updated.

## 2014-06-07 NOTE — Progress Notes (Signed)
Dr. Coralyn Pear aware via phone of recent bladder scan results. Pt unable to void spontaneously and bladder scan showed 150 ml urine. See new orders for foley catheter entered by MD.

## 2014-06-07 NOTE — Progress Notes (Signed)
ANTIBIOTIC CONSULT NOTE - INITIAL  Pharmacy Consult for Rocephin Indication: UTI  Allergies  Allergen Reactions  . Codeine     GI upset  . Morphine And Related     "In a scarry movie" weird thoughts    Patient Measurements: Height: 5\' 1"  (154.9 cm) Weight: 185 lb (83.915 kg) IBW/kg (Calculated) : 47.8 Adjusted Body Weight:   Vital Signs: Temp: 98.3 F (36.8 C) (10/04 0515) Temp Source: Oral (10/04 0515) BP: 105/57 mmHg (10/04 0515) Pulse Rate: 99 (10/04 0515) Intake/Output from previous day: 10/03 0701 - 10/04 0700 In: 1151.3 [P.O.:120; I.V.:1031.3] Out: 375 [Urine:375] Intake/Output from this shift: Total I/O In: -  Out: 175 [Urine:175]  Labs:  Recent Labs  06/06/14 0137  WBC 11.3*  HGB 14.4  PLT 212  CREATININE 0.77   Estimated Creatinine Clearance: 68.8 ml/min (by C-G formula based on Cr of 0.77). No results found for this basename: VANCOTROUGH, VANCOPEAK, VANCORANDOM, GENTTROUGH, GENTPEAK, GENTRANDOM, TOBRATROUGH, TOBRAPEAK, TOBRARND, AMIKACINPEAK, AMIKACINTROU, AMIKACIN,  in the last 72 hours   Microbiology: No results found for this or any previous visit (from the past 720 hour(s)).  Medical History: Past Medical History  Diagnosis Date  . Depression   . Pancreatitis   . Hay fever   . Urine incontinence   . UTI (urinary tract infection)     reoccuring   . DEPRESSION 04/01/2010  . PANCREATITIS, HX OF 04/01/2010    Medications:  Anti-infectives   Start     Dose/Rate Route Frequency Ordered Stop   06/07/14 0600  cefTRIAXone (ROCEPHIN) 1 g in dextrose 5 % 50 mL IVPB     1 g 100 mL/hr over 30 Minutes Intravenous Every 24 hours 06/07/14 0559       Assessment: Patient with UTI.  Goal of Therapy:  Rocephin based on manufacturer dosing recommendations.   Plan:  Follow up culture results Rocephin 1gm iv q24hr  Nani Skillern Crowford 06/07/2014,6:01 AM

## 2014-06-07 NOTE — Progress Notes (Signed)
[facility] PROGRESS NOTE  Linda Blackburn TKW:[id number] DOB: 04/10/1951 DOA: 06/06/2014 PCP: [name], MD  Assessment/Plan: 1. Acute pancreatitis -Patient presenting with severe abdominal pain, found to have lipase greater than 3000. She has a history of cholecystectomy. -Abdominal ultrasound confirm gallbladder surgically absent, normal appearance of the liver and bile ducts. Radiology reporting segmental visualization of the pancreas with suggestion of mild ductal dilatation -Continue supportive care, IV fluid administration, n.p.o., IV narcotic analgesics -Pending triglycerides -Labs showing a downward trend in her lipase to 2084 from >3000 on admission -Continue IV fluid resuscitation and narcotic analgesia.  2.  Acute Kidney Injury -AM labs showing increasing Creatinine 1.31 -This could be related to hypovolemia or ATN from hypotension. Urine output dropping.  -Ordered 1L bolus of NS, increase IV fluids to 150 mL/hour rate after bolus -ARB therapy discontinued -Follow closely today. On exam she does not appear toxic.   3.  Urinary Tract Infection -U/A overnight showing presence of bacteria and leukocytes -She was started on Rocephin 1 g IV q daily  4.  Leukocytosis -She developed leukocytosis over the past 24 hours, with urine showing many bacteria -This could be related to UTI -Lipase is trending down.  -Will check a lactate level, blood cultures -Abdomen remains tender, I think it would be prudent to check a CT Abd/pelvis to assess for a potential developing intraabdominal infectious process  5. History of Hypertension -Patient becoming hypotensive for which ARB was discontinued   Code Status: Full code Family Communication:  Disposition Plan: Continue supportive care anticipate she will be discharged home once medically stable   HPI/Subjective: Patient is a pleasant 63 year old female who was admitted to the medicine service on 06/06/2014 presenting with  complaints of abdominal pain. She was found to have a lipase over 3000, admitted to [facility] where she was made n.p.o., administered IV fluids and as needed narcotic analgesics for acute pancreatitis.  Objective: Filed Vitals:   06/07/14 1316  BP: 98/57  Pulse: 92  Temp: 98.1 F (36.7 C)  Resp:     Intake/Output Summary (Last 24 hours) at 06/07/14 1319 Last data filed at 06/07/14 0955  Gross per 24 hour  Intake 3432.92 ml  Output    175 ml  Net 3257.92 ml   Filed Weights   06/06/14 0044  Weight: 83.915 kg (185 lb)    Exam:   General:  Patient is in no acute distress, she remains nontoxic, mentating well.   Cardiovascular: Regular rate and rhythm normal S1-S2 no murmurs rubs or gallops  Respiratory: Normal respiratory effort  Abdomen: Has generalized pain to palpation, no rebound tenderness or guarding.   Musculoskeletal: No edema  Data Reviewed: Basic Metabolic Panel:  Recent Labs Lab 06/06/14 0137 06/07/14 0541  NA 140 142  K 3.8 4.2  CL 106 109  CO2 21 21  GLUCOSE 116* 121*  BUN 16 24*  CREATININE 0.77 1.31*  CALCIUM 9.1 7.9*   Liver Function Tests:  Recent Labs Lab 06/06/14 0137 06/07/14 0541  AST 29 44*  ALT 29 44*  ALKPHOS 77 82  BILITOT 0.4 1.1  PROT 7.1 6.7  ALBUMIN 3.9 3.7    Recent Labs Lab 06/06/14 0137 06/07/14 0541  LIPASE >3000* 2084*   No results found for this basename: AMMONIA,  in the last 168 hours CBC:  Recent Labs Lab 06/06/14 0137 06/07/14 0541  WBC 11.3* 14.8*  NEUTROABS 7.9*  --   HGB 14.4 13.3  HCT 42.9 42.4  MCV 89.9 95.7  PLT 212  220   Cardiac Enzymes:  Recent Labs Lab 06/06/14 0137  TROPONINI <0.30   BNP (last 3 results) No results found for this basename: PROBNP,  in the last 8760 hours CBG:  Recent Labs Lab 06/06/14 1235 06/06/14 1646 06/06/14 2112 06/07/14 0759 06/07/14 1153  GLUCAP 99 90 99 92 91    No results found for this or any previous visit (from the past 240 hour(s)).    Studies: US Abdomen Complete  06/06/2014   CLINICAL DATA:  Recurrent pancreatitis.  EXAM: ULTRASOUND ABDOMEN COMPLETE  COMPARISON:  None.  FINDINGS: Gallbladder: Gallbladder is surgically absent.  Common bile duct: Diameter: 5.1 mm, normal  Liver: No focal lesion identified. Within normal limits in parenchymal echogenicity.  IVC: Not visualized due to overlying bowel gas.  Pancreas: Limited visualization due to overlying bowel gas. Segments of the head and body are demonstrated and a mildly dilated bile duct at 4 mm is appreciated. This is compatible with history of pancreatitis although nonspecific.  Spleen: Size and appearance within normal limits.  Right Kidney: Length: 10.8 cm. Echogenicity within normal limits. No mass or hydronephrosis visualized.  Left Kidney: Length: 10.5 cm. Echogenicity within normal limits. No mass or hydronephrosis visualized.  Abdominal aorta: Proximal abdominal aorta only is visualized and is normal caliber. Mid and distal aorta is obscured by overlying bowel gas.  Other findings: None.  IMPRESSION: Gallbladder surgically absent. Normal appearance of the liver and bile ducts. Segmental visualization of the pancreas with suggestion of mild ductal dilatation.   Electronically Signed   By: Lucienne Capers M.D.   On: 06/06/2014 06:35    Scheduled Meds: . antiseptic oral rinse  7 mL Mouth Rinse BID  . cefTRIAXone (ROCEPHIN)  IV  1 g Intravenous Q24H  . heparin  5,000 Units Subcutaneous 3 times per day  . loratadine  10 mg Oral Daily  . sertraline  100 mg Oral Daily   Continuous Infusions: . sodium chloride      Principal Problem:   Acute recurrent pancreatitis Active Problems:   Pancreas divisum   Pancreatitis    Time spent: 45 min    Kelvin Cellar  [facility] Pager [phone]. If 7PM-7AM, please contact night-coverage at [email or web address], password [facility] 06/07/2014, 1:19 PM  LOS: 1 day

## 2014-06-07 NOTE — Progress Notes (Signed)
Dr. Coralyn Pear aware via phone of recent attempts of foley catheter insertion. Primary RN and NT attempted without success. Inflammation and redness noted. Urology nurse from [facility] called for assistance. Hinton Dyer, RN attempted with 16 Fr catheter without success. Another [place], Ebony Hail, attempted with a 14 Fr yet was still unable to achieve urine return. Pt reassured and comforted verbally and through touch throughout process. No new orders received from MD at this time.

## 2014-06-08 LAB — GLUCOSE, CAPILLARY
GLUCOSE-CAPILLARY: 72 mg/dL (ref 70–99)
GLUCOSE-CAPILLARY: 103 mg/dL — AB (ref 70–99)
GLUCOSE-CAPILLARY: 118 mg/dL — AB (ref 70–99)
Glucose-Capillary: 93 mg/dL (ref 70–99)

## 2014-06-08 LAB — URINE CULTURE: Colony Count: >=100000

## 2014-06-08 LAB — BASIC METABOLIC PANEL
ANION GAP: 13 (ref 5–15)
BUN: 26 mg/dL — ABNORMAL HIGH (ref 6–23)
CO2: 19 meq/L (ref 19–32)
CREATININE: 1.05 mg/dL (ref 0.50–1.10)
Calcium: 7.8 mg/dL — ABNORMAL LOW (ref 8.4–10.5)
Chloride: 109 mEq/L (ref 96–112)
GFR calc non Af Amer: 55 mL/min — ABNORMAL LOW (ref >90)
GFR, EST AFRICAN AMERICAN: 63 mL/min — AB (ref >90)
Glucose, Bld: 90 mg/dL (ref 70–99)
Potassium: 4 mEq/L (ref 3.7–5.3)
SODIUM: 141 meq/L (ref 137–147)

## 2014-06-08 LAB — LIPASE, BLOOD: LIPASE: 274 U/L — AB (ref 11–59)

## 2014-06-08 LAB — CBC
HEMATOCRIT: 39.9 % (ref 36.0–46.0)
Hemoglobin: 12.4 g/dL (ref 12.0–15.0)
MCH: 30.1 pg (ref 26.0–34.0)
MCHC: 31.1 g/dL (ref 30.0–36.0)
MCV: 96.8 fL (ref 78.0–100.0)
Platelets: 195 10*3/uL (ref 150–400)
RBC: 4.12 MIL/uL (ref 3.87–5.11)
RDW: 14.5 % (ref 11.5–15.5)
WBC: 16.9 10*3/uL — AB (ref 4.0–10.5)

## 2014-06-08 MED ORDER — FUROSEMIDE 10 MG/ML IJ SOLN
20.0000 mg | Freq: Once | INTRAMUSCULAR | Status: AC
  Administered 2014-06-08: 20 mg via INTRAVENOUS
  Filled 2014-06-08: qty 2

## 2014-06-08 MED ORDER — ALBUTEROL SULFATE (2.5 MG/3ML) 0.083% IN NEBU
2.5000 mg | INHALATION_SOLUTION | RESPIRATORY_TRACT | Status: DC | PRN
  Administered 2014-06-08: 2.5 mg via RESPIRATORY_TRACT
  Filled 2014-06-08: qty 3

## 2014-06-08 MED ORDER — OXYCODONE HCL 5 MG PO TABS
10.0000 mg | ORAL_TABLET | Freq: Four times a day (QID) | ORAL | Status: DC | PRN
  Administered 2014-06-08 – 2014-06-09 (×3): 10 mg via ORAL
  Filled 2014-06-08 (×3): qty 2

## 2014-06-08 NOTE — Progress Notes (Signed)
[facility] PROGRESS NOTE  Linda Blackburn ZOX:[id number] DOB: Mar 29, 1951 DOA: 06/06/2014 PCP: [name], MD  Assessment/Plan: 1. Acute pancreatitis -Patient presenting with severe abdominal pain, found to have lipase greater than 3000. She has a history of cholecystectomy. -Abdominal ultrasound confirm gallbladder surgically absent, normal appearance of the liver and bile ducts. Radiology reporting segmental visualization of the pancreas with suggestion of mild ductal dilatation -CT scan showed Pancreatitis without complicating features.  -Continue supportive care, IV fluid administration, narcotic analgesics, now on clears -Triglycerides at 108 -Labs showing a downward trend in her lipase to 274 from >3000 on admission -Continue IV fluid resuscitation and narcotic analgesia.On clears now.   2.  Acute Kidney Injury -AM labs showing increasing Creatinine 1.31 on 06/07/2014, now improved to 1.05 on am labs -This could be related to hypovolemia or ATN from hypotension. Urine output dropping.  -ARB therapy discontinued -Continue IV fluids  3.  Urinary Tract Infection -U/A overnight showing presence of bacteria and leukocytes -She was started on Rocephin 1 g IV q daily  4.  Leukocytosis -She developed leukocytosis over the past 24 hours, with urine showing many bacteria -This could be related to UTI -Lipase is trending down.  -Lactate=0.9 -CT scan of abdomen and pelvis did not show complicating features of pancreatitis  5. History of Hypertension -Patient becoming hypotensive for which ARB was discontinued   Code Status: Full code Family Communication:  Disposition Plan: Continue supportive care anticipate she will be discharged home once medically stable   HPI/Subjective: Patient is a pleasant 63 year old female who was admitted to the medicine service on 06/06/2014 presenting with complaints of abdominal pain. She was found to have a lipase over 3000, admitted to  [facility] where she was made n.p.o., administered IV fluids and as needed narcotic analgesics for acute pancreatitis.  Objective: Filed Vitals:   06/08/14 0510  BP: 133/63  Pulse: 88  Temp: 98.4 F (36.9 C)  Resp: 18    Intake/Output Summary (Last 24 hours) at 06/08/14 1320 Last data filed at 06/08/14 1018  Gross per 24 hour  Intake 3497.5 ml  Output   1325 ml  Net 2172.5 ml   Filed Weights   06/06/14 0044  Weight: 83.915 kg (185 lb)    Exam:   General:  Patient is in no acute distress, she remains nontoxic, mentating well.   Cardiovascular: Regular rate and rhythm normal S1-S2 no murmurs rubs or gallops  Respiratory: Normal respiratory effort  Abdomen: Has generalized pain to palpation, no rebound tenderness or guarding.   Musculoskeletal: No edema  Data Reviewed: Basic Metabolic Panel:  Recent Labs Lab 06/06/14 0137 06/07/14 0541 06/08/14 0525  NA 140 142 141  K 3.8 4.2 4.0  CL 106 109 109  CO2 21 21 19   GLUCOSE 116* 121* 90  BUN 16 24* 26*  CREATININE 0.77 1.31* 1.05  CALCIUM 9.1 7.9* 7.8*   Liver Function Tests:  Recent Labs Lab 06/06/14 0137 06/07/14 0541  AST 29 44*  ALT 29 44*  ALKPHOS 77 82  BILITOT 0.4 1.1  PROT 7.1 6.7  ALBUMIN 3.9 3.7    Recent Labs Lab 06/06/14 0137 06/07/14 0541 06/08/14 0525  LIPASE >3000* 2084* 274*   No results found for this basename: AMMONIA,  in the last 168 hours CBC:  Recent Labs Lab 06/06/14 0137 06/07/14 0541 06/08/14 0525  WBC 11.3* 14.8* 16.9*  NEUTROABS 7.9*  --   --   HGB 14.4 13.3 12.4  HCT 42.9 42.4 39.9  MCV 89.9  95.7 96.8  PLT 212 220 195   Cardiac Enzymes:  Recent Labs Lab 06/06/14 0137  TROPONINI <0.30   BNP (last 3 results) No results found for this basename: PROBNP,  in the last 8760 hours CBG:  Recent Labs Lab 06/07/14 1153 06/07/14 1655 06/07/14 2209 06/08/14 0756 06/08/14 1148  GLUCAP 91 93 84 72 103*    Recent Results (from the past 240 hour(s))  URINE  CULTURE     Status: None   Collection Time    06/07/14  6:13 AM      Result Value Ref Range Status   Specimen Description URINE, CLEAN CATCH   Final   Special Requests NONE   Final   Culture  Setup Time     Final   Value: 06/07/2014 14:13     Performed at [facility]     Final   Value: >=100,000 COLONIES/ML     Performed at [facility]   Culture     Final   Value: Multiple bacterial morphotypes present, none predominant. Suggest appropriate recollection if clinically indicated.     Performed at [facility]   Report Status 06/08/2014 FINAL   Final  CULTURE, BLOOD (ROUTINE X 2)     Status: None   Collection Time    06/07/14  2:00 PM      Result Value Ref Range Status   Specimen Description BLOOD RIGHT ARM   Final   Special Requests     Final   Value: BOTTLES DRAWN AEROBIC AND ANAEROBIC 10CC BOTH BOTTLES   Culture  Setup Time     Final   Value: 06/07/2014 18:21     Performed at [facility]   Culture     Final   Value:        BLOOD CULTURE RECEIVED NO GROWTH TO DATE CULTURE WILL BE HELD FOR 5 DAYS BEFORE ISSUING A FINAL NEGATIVE REPORT     Performed at [facility]   Report Status PENDING   Incomplete  CULTURE, BLOOD (ROUTINE X 2)     Status: None   Collection Time    06/07/14  2:07 PM      Result Value Ref Range Status   Specimen Description BLOOD LEFT ARM   Final   Special Requests BOTTLES DRAWN AEROBIC ONLY 5CC BLUE BOTTLE   Final   Culture  Setup Time     Final   Value: 06/07/2014 18:21     Performed at [facility]   Culture     Final   Value:        BLOOD CULTURE RECEIVED NO GROWTH TO DATE CULTURE WILL BE HELD FOR 5 DAYS BEFORE ISSUING A FINAL NEGATIVE REPORT     Performed at [facility]   Report Status PENDING   Incomplete     Studies: Ct Abdomen Pelvis Wo Contrast  06/07/2014   ADDENDUM REPORT: 06/07/2014 16:37  ADDENDUM: After unsuccessful attempts to reach Dr. Coralyn Pear, these results were  called by telephone at the time of interpretation on 06/07/2014 at 4:35 pm to ptient's nurse, Alphonzo Lemmings, who verbally acknowledged these results.   Electronically Signed   By: Marin Olp M.D.   On: 06/07/2014 16:37   06/07/2014   CLINICAL DATA:  Generalized abdominal pain and tenderness. History of acute pancreatitis and appendectomy. Previous cholecystectomy.  EXAM: CT ABDOMEN AND PELVIS WITHOUT CONTRAST. No IV contrast per ordering physician due to worsening creatinine.  TECHNIQUE:  Multidetector CT imaging of the abdomen and pelvis was performed following the standard protocol without IV contrast.  COMPARISON:  Right upper quadrant ultrasound 06/06/2014  FINDINGS: Lung bases demonstrate a small left pleural effusion and tiny amount of right pleural fluid. There is associated atelectasis in the lung bases. Two small calcified granulomas present over the right lower lobe.  Abdominal images demonstrate evidence of previous cholecystectomy. There is moderate inflammatory change and fluid within the peripancreatic fat compatible with patient's known acute pancreatitis. No definite evidence of pancreatic necrosis or pseudocyst formation. Minimal free fluid in the pericolic gutters.  The liver, spleen and adrenal glands are within normal. Kidneys are normal size without hydronephrosis or nephrolithiasis. Ureters are normal. There is mild diverticulosis of the colon. Surgical absence of the appendix. Retro aortic left renal vein is present.  Pelvic images demonstrate mild amount of free fluid. There is surgical absence of the uterus. Bladder and rectum are within normal. Mild degenerate change of the spine and hips.  IMPRESSION: Evidence patient's known acute pancreatitis without complicating features.  Very small amount of bilateral pleural fluid left worse than right with associated atelectasis.  Diverticulosis of the colon.  Postsurgical findings as described.  Electronically Signed: By: Marin Olp M.D. On:  06/07/2014 15:46    Scheduled Meds: . antiseptic oral rinse  7 mL Mouth Rinse BID  . cefTRIAXone (ROCEPHIN)  IV  1 g Intravenous Q24H  . heparin  5,000 Units Subcutaneous 3 times per day  . loratadine  10 mg Oral Daily  . sertraline  100 mg Oral Daily   Continuous Infusions: . sodium chloride 150 mL/hr at 06/08/14 1003    Principal Problem:   Acute recurrent pancreatitis Active Problems:   Pancreas divisum   Pancreatitis    Time spent: 28 min    Kelvin Cellar  [facility] Pager [phone]. If 7PM-7AM, please contact night-coverage at [email or web address], password [facility] 06/08/2014, 1:20 PM  LOS: 2 days

## 2014-06-09 DIAGNOSIS — E8770 Fluid overload, unspecified: Secondary | ICD-10-CM

## 2014-06-09 LAB — BASIC METABOLIC PANEL
Anion gap: 12 (ref 5–15)
BUN: 18 mg/dL (ref 6–23)
CALCIUM: 8.1 mg/dL — AB (ref 8.4–10.5)
CO2: 22 mEq/L (ref 19–32)
Chloride: 107 mEq/L (ref 96–112)
Creatinine, Ser: 0.81 mg/dL (ref 0.50–1.10)
GFR calc Af Amer: 86 mL/min — ABNORMAL LOW (ref >90)
GFR, EST NON AFRICAN AMERICAN: 75 mL/min — AB (ref >90)
Glucose, Bld: 110 mg/dL — ABNORMAL HIGH (ref 70–99)
Potassium: 3.5 mEq/L — ABNORMAL LOW (ref 3.7–5.3)
Sodium: 141 mEq/L (ref 137–147)

## 2014-06-09 LAB — CBC
HCT: 32.7 % — ABNORMAL LOW (ref 36.0–46.0)
Hemoglobin: 10.6 g/dL — ABNORMAL LOW (ref 12.0–15.0)
MCH: 30.4 pg (ref 26.0–34.0)
MCHC: 32.4 g/dL (ref 30.0–36.0)
MCV: 93.7 fL (ref 78.0–100.0)
PLATELETS: 213 10*3/uL (ref 150–400)
RBC: 3.49 MIL/uL — ABNORMAL LOW (ref 3.87–5.11)
RDW: 14.5 % (ref 11.5–15.5)
WBC: 12.9 10*3/uL — ABNORMAL HIGH (ref 4.0–10.5)

## 2014-06-09 LAB — GLUCOSE, CAPILLARY
GLUCOSE-CAPILLARY: 89 mg/dL (ref 70–99)
GLUCOSE-CAPILLARY: 94 mg/dL (ref 70–99)
Glucose-Capillary: 93 mg/dL (ref 70–99)

## 2014-06-09 LAB — LIPASE, BLOOD: Lipase: 49 U/L (ref 11–59)

## 2014-06-09 MED ORDER — FUROSEMIDE 10 MG/ML IJ SOLN
20.0000 mg | Freq: Once | INTRAMUSCULAR | Status: AC
  Administered 2014-06-09: 20 mg via INTRAVENOUS
  Filled 2014-06-09: qty 2

## 2014-06-09 MED ORDER — OXYCODONE HCL 5 MG PO TABS
5.0000 mg | ORAL_TABLET | Freq: Four times a day (QID) | ORAL | Status: DC | PRN
  Administered 2014-06-09 – 2014-06-12 (×10): 5 mg via ORAL
  Filled 2014-06-09 (×11): qty 1

## 2014-06-09 MED ORDER — CIPROFLOXACIN HCL 500 MG PO TABS
500.0000 mg | ORAL_TABLET | Freq: Two times a day (BID) | ORAL | Status: DC
  Administered 2014-06-09 – 2014-06-10 (×3): 500 mg via ORAL
  Filled 2014-06-09 (×5): qty 1

## 2014-06-09 NOTE — Progress Notes (Signed)
Discussed patient decreased oxygen level with MD.  Patient also requesting IV pain medication although appears drowsy.  Orders placed for Lasix and encourage po pain meds

## 2014-06-09 NOTE — Progress Notes (Signed)
[facility] PROGRESS NOTE  Linda Blackburn NWG:[id number] DOB: 06-30-1951 DOA: 06/06/2014 PCP: [name], MD  Interim Summary Patient is a pleasant 63 year old female who was admitted to the medicine service on 06/06/2014 presenting with complaints of abdominal pain. She was found to have a lipase over 3000, admitted to [place] where she was made n.p.o., administered IV fluids and as needed narcotic analgesics for acute pancreatitis. During this hospitalization she became hypotensive with significant drop in urinary output, labs show an increasing white count as well as development of acute kidney injury. She was aggressively volume resuscitated. A CT scan of abdomen and pelvis revealed acute pancreatitis without complicating features. No evidence of intra-abdominal infection. Urinalysis was consistent with urinary tract infection as she was started on ceftriaxone therapy. Over the last 2 days she has shown market clinical improvement with downward trend in her lipase. Today's labs showing a lipase of 49. Kidney failure resolving with white count trending down to 12,900. Will advance her diet to a full liquid diet today and transition to oral antibiotic therapy. Attempt to manage pain with oral narcotic analgesics.                                                                                                  Assessment/Plan: 1. Acute pancreatitis -Patient presenting with severe abdominal pain, found to have lipase greater than 3000. She has a history of cholecystectomy. -Abdominal ultrasound confirm gallbladder surgically absent, normal appearance of the liver and bile ducts. Radiology reporting segmental visualization of the pancreas with suggestion of mild ductal dilatation -CT scan showed Pancreatitis without complicating features.  -Triglycerides at 108 -Labs showing a downward trend in her lipase to 49 from >3000 on admission -Advancing her diet today to a full liquid diet, stopping  be dilaudid, starting oxycodone every 6 hours as needed  2.  Acute Kidney Injury -Increasing Creatinine to 1.31 on 06/07/2014, now improved to 0.81 on am labs -Likely related to hypovolemia or ATN from hypotension. Improved urine output.  -ARB therapy discontinued  3.  Urinary Tract Infection -U/A overnight showing presence of bacteria and leukocytes -She was started on Rocephin 1 g IV q daily, will transition to oral Cipro. Urine cultures showing multiple organisms  4.  Leukocytosis -Likely secondary to acute pancreatitis and UTI -Improving -Will change to Cipro today  5. History of Hypertension -Patient becoming hypotensive for which ARB was discontinued  6.  Volume Overload -Patient having increasing wheezes, complaining of shortness of breath and having difficulty laying flat. -She was volume resuscitated during this hospitalization  -Will give 20 mg of IV lasix today, IV fluids were stopped.    Code Status: Full code Family Communication:  Disposition Plan: Continue supportive care anticipate she will be discharged home once medically stable   HPI/Subjective: Patient tolerated clears, advancing diet. She appears mildly sedated however is arousable and follows commands.   Objective: Filed Vitals:   06/09/14 1047  BP: 136/76  Pulse: 92  Temp: 98.8 F (37.1 C)  Resp: 16    Intake/Output Summary (Last 24 hours) at 06/09/14 1247 Last data filed at 06/09/14  1216  Gross per 24 hour  Intake 1216.54 ml  Output   3350 ml  Net -2133.46 ml   Filed Weights   06/06/14 0044  Weight: 83.915 kg (185 lb)    Exam:   General:  Patient is in no acute distress, she remains nontoxic, mentating well, mildly sedated today  Cardiovascular: Regular rate and rhythm normal S1-S2 no murmurs rubs or gallops  Respiratory: Normal respiratory effort, has bilateral crackles developing   Abdomen: Has generalized pain to palpation, no rebound tenderness or guarding. Overall abdominal  pain improved.   Musculoskeletal: No edema  Data Reviewed: Basic Metabolic Panel:  Recent Labs Lab 06/06/14 0137 06/07/14 0541 06/08/14 0525 06/09/14 0513  NA 140 142 141 141  K 3.8 4.2 4.0 3.5*  CL 106 109 109 107  CO2 21 21 19 22   GLUCOSE 116* 121* 90 110*  BUN 16 24* 26* 18  CREATININE 0.77 1.31* 1.05 0.81  CALCIUM 9.1 7.9* 7.8* 8.1*   Liver Function Tests:  Recent Labs Lab 06/06/14 0137 06/07/14 0541  AST 29 44*  ALT 29 44*  ALKPHOS 77 82  BILITOT 0.4 1.1  PROT 7.1 6.7  ALBUMIN 3.9 3.7    Recent Labs Lab 06/06/14 0137 06/07/14 0541 06/08/14 0525 06/09/14 0513  LIPASE >3000* 2084* 274* 49   No results found for this basename: AMMONIA,  in the last 168 hours CBC:  Recent Labs Lab 06/06/14 0137 06/07/14 0541 06/08/14 0525 06/09/14 0513  WBC 11.3* 14.8* 16.9* 12.9*  NEUTROABS 7.9*  --   --   --   HGB 14.4 13.3 12.4 10.6*  HCT 42.9 42.4 39.9 32.7*  MCV 89.9 95.7 96.8 93.7  PLT 212 220 195 213   Cardiac Enzymes:  Recent Labs Lab 06/06/14 0137  TROPONINI <0.30   BNP (last 3 results) No results found for this basename: PROBNP,  in the last 8760 hours CBG:  Recent Labs Lab 06/08/14 0756 06/08/14 1148 06/08/14 1614 06/08/14 2151 06/09/14 0722  GLUCAP 72 103* 93 118* 93    Recent Results (from the past 240 hour(s))  URINE CULTURE     Status: None   Collection Time    06/07/14  6:13 AM      Result Value Ref Range Status   Specimen Description URINE, CLEAN CATCH   Final   Special Requests NONE   Final   Culture  Setup Time     Final   Value: 06/07/2014 14:13     Performed at [place]     Final   Value: >=100,000 COLONIES/ML     Performed at [facility]   Culture     Final   Value: Multiple bacterial morphotypes present, none predominant. Suggest appropriate recollection if clinically indicated.     Performed at [facility]   Report Status 06/08/2014 FINAL   Final  CULTURE, BLOOD  (ROUTINE X 2)     Status: None   Collection Time    06/07/14  2:00 PM      Result Value Ref Range Status   Specimen Description BLOOD RIGHT ARM   Final   Special Requests     Final   Value: BOTTLES DRAWN AEROBIC AND ANAEROBIC 10CC BOTH BOTTLES   Culture  Setup Time     Final   Value: 06/07/2014 18:21     Performed at [facility]   Culture     Final   Value:  BLOOD CULTURE RECEIVED NO GROWTH TO DATE CULTURE WILL BE HELD FOR 5 DAYS BEFORE ISSUING A FINAL NEGATIVE REPORT     Note: Culture results may be compromised due to an excessive volume of blood received in culture bottles.     Performed at [facility]   Report Status PENDING   Incomplete  CULTURE, BLOOD (ROUTINE X 2)     Status: None   Collection Time    06/07/14  2:07 PM      Result Value Ref Range Status   Specimen Description BLOOD LEFT ARM   Final   Special Requests BOTTLES DRAWN AEROBIC ONLY 5CC BLUE BOTTLE   Final   Culture  Setup Time     Final   Value: 06/07/2014 18:21     Performed at [facility]   Culture     Final   Value:        BLOOD CULTURE RECEIVED NO GROWTH TO DATE CULTURE WILL BE HELD FOR 5 DAYS BEFORE ISSUING A FINAL NEGATIVE REPORT     Performed at [facility]   Report Status PENDING   Incomplete     Studies: Ct Abdomen Pelvis Wo Contrast  06/07/2014   ADDENDUM REPORT: 06/07/2014 16:37  ADDENDUM: After unsuccessful attempts to reach Dr. Coralyn Pear, these results were called by telephone at the time of interpretation on 06/07/2014 at 4:35 pm to ptient's nurse, Alphonzo Lemmings, who verbally acknowledged these results.   Electronically Signed   By: Marin Olp M.D.   On: 06/07/2014 16:37   06/07/2014   CLINICAL DATA:  Generalized abdominal pain and tenderness. History of acute pancreatitis and appendectomy. Previous cholecystectomy.  EXAM: CT ABDOMEN AND PELVIS WITHOUT CONTRAST. No IV contrast per ordering physician due to worsening creatinine.  TECHNIQUE: Multidetector CT  imaging of the abdomen and pelvis was performed following the standard protocol without IV contrast.  COMPARISON:  Right upper quadrant ultrasound 06/06/2014  FINDINGS: Lung bases demonstrate a small left pleural effusion and tiny amount of right pleural fluid. There is associated atelectasis in the lung bases. Two small calcified granulomas present over the right lower lobe.  Abdominal images demonstrate evidence of previous cholecystectomy. There is moderate inflammatory change and fluid within the peripancreatic fat compatible with patient's known acute pancreatitis. No definite evidence of pancreatic necrosis or pseudocyst formation. Minimal free fluid in the pericolic gutters.  The liver, spleen and adrenal glands are within normal. Kidneys are normal size without hydronephrosis or nephrolithiasis. Ureters are normal. There is mild diverticulosis of the colon. Surgical absence of the appendix. Retro aortic left renal vein is present.  Pelvic images demonstrate mild amount of free fluid. There is surgical absence of the uterus. Bladder and rectum are within normal. Mild degenerate change of the spine and hips.  IMPRESSION: Evidence patient's known acute pancreatitis without complicating features.  Very small amount of bilateral pleural fluid left worse than right with associated atelectasis.  Diverticulosis of the colon.  Postsurgical findings as described.  Electronically Signed: By: Marin Olp M.D. On: 06/07/2014 15:46    Scheduled Meds: . antiseptic oral rinse  7 mL Mouth Rinse BID  . ciprofloxacin  500 mg Oral BID  . heparin  5,000 Units Subcutaneous 3 times per day  . loratadine  10 mg Oral Daily  . sertraline  100 mg Oral Daily   Continuous Infusions:    Principal Problem:   Acute recurrent pancreatitis Active Problems:   Pancreas divisum   Pancreatitis    Time  spent: 35 min    Kelvin Cellar  [facility] Pager [phone]. If 7PM-7AM, please contact night-coverage at  [email or web address], password [facility] 06/09/2014, 12:47 PM  LOS: 3 days

## 2014-06-10 ENCOUNTER — Inpatient Hospital Stay (HOSPITAL_COMMUNITY): Payer: Medicare Other

## 2014-06-10 DIAGNOSIS — J189 Pneumonia, unspecified organism: Secondary | ICD-10-CM

## 2014-06-10 DIAGNOSIS — N390 Urinary tract infection, site not specified: Secondary | ICD-10-CM

## 2014-06-10 DIAGNOSIS — E876 Hypokalemia: Secondary | ICD-10-CM | POA: Diagnosis present

## 2014-06-10 DIAGNOSIS — J9601 Acute respiratory failure with hypoxia: Secondary | ICD-10-CM

## 2014-06-10 DIAGNOSIS — D72829 Elevated white blood cell count, unspecified: Secondary | ICD-10-CM

## 2014-06-10 DIAGNOSIS — E8770 Fluid overload, unspecified: Secondary | ICD-10-CM | POA: Diagnosis not present

## 2014-06-10 LAB — BASIC METABOLIC PANEL
ANION GAP: 11 (ref 5–15)
BUN: 11 mg/dL (ref 6–23)
CALCIUM: 8.5 mg/dL (ref 8.4–10.5)
CO2: 24 mEq/L (ref 19–32)
Chloride: 102 mEq/L (ref 96–112)
Creatinine, Ser: 0.66 mg/dL (ref 0.50–1.10)
GFR calc non Af Amer: >90 mL/min (ref >90)
Glucose, Bld: 98 mg/dL (ref 70–99)
Potassium: 3.2 mEq/L — ABNORMAL LOW (ref 3.7–5.3)
SODIUM: 137 meq/L (ref 137–147)

## 2014-06-10 LAB — CBC
HCT: 32.3 % — ABNORMAL LOW (ref 36.0–46.0)
Hemoglobin: 10.4 g/dL — ABNORMAL LOW (ref 12.0–15.0)
MCH: 29.7 pg (ref 26.0–34.0)
MCHC: 32.2 g/dL (ref 30.0–36.0)
MCV: 92.3 fL (ref 78.0–100.0)
PLATELETS: ADEQUATE 10*3/uL (ref 150–400)
RBC: 3.5 MIL/uL — ABNORMAL LOW (ref 3.87–5.11)
RDW: 14.2 % (ref 11.5–15.5)
WBC: 14.7 10*3/uL — ABNORMAL HIGH (ref 4.0–10.5)

## 2014-06-10 LAB — GLUCOSE, CAPILLARY
Glucose-Capillary: 94 mg/dL (ref 70–99)
Glucose-Capillary: 98 mg/dL (ref 70–99)
Glucose-Capillary: 106 mg/dL — ABNORMAL HIGH (ref 70–99)
Glucose-Capillary: 114 mg/dL — ABNORMAL HIGH (ref 70–99)
Glucose-Capillary: 117 mg/dL — ABNORMAL HIGH (ref 70–99)

## 2014-06-10 MED ORDER — POTASSIUM CHLORIDE CRYS ER 20 MEQ PO TBCR
40.0000 meq | EXTENDED_RELEASE_TABLET | ORAL | Status: AC
  Administered 2014-06-10 (×2): 40 meq via ORAL
  Filled 2014-06-10 (×4): qty 2

## 2014-06-10 MED ORDER — FUROSEMIDE 10 MG/ML IJ SOLN
20.0000 mg | Freq: Once | INTRAMUSCULAR | Status: AC
  Administered 2014-06-10: 20 mg via INTRAVENOUS
  Filled 2014-06-10 (×2): qty 2

## 2014-06-10 MED ORDER — LEVOFLOXACIN IN D5W 750 MG/150ML IV SOLN
750.0000 mg | INTRAVENOUS | Status: DC
  Administered 2014-06-10 – 2014-06-11 (×2): 750 mg via INTRAVENOUS
  Filled 2014-06-10 (×2): qty 150

## 2014-06-10 NOTE — Progress Notes (Signed)
PROGRESS NOTE  Linda Blackburn DTO:[id number] DOB: 09-25-1950 DOA: 06/06/2014 PCP: [name], MD  HPI: 63 year old female who was admitted to the medicine service on 06/06/2014 presenting with complaints of abdominal pain. She was found to have a lipase over 3000, admitted to [place] where she was made n.p.o., administered IV fluids and as needed narcotic analgesics for acute pancreatitis. She has been improving from her pancreatitis standpoint however she was been having mild fluid overload.  Subjective/ 24 H Interval events - feeling the same this morning, still with mild dyspnea, desats when walking on room air  Assessment/Plan: Acute pancreatitis - Patient presenting with severe abdominal pain, found to have lipase greater than 3000. She improved with hydration and lipase is now within normal limits.  - Abdominal ultrasound confirm gallbladder surgically absent, normal appearance of the liver and bile ducts.  - CT scan showed Pancreatitis without complicating features.  - Triglycerides at 108  - Labs showing a downward trend in her lipase to 49 from >3000 on admission  - on a full liquid diet, not eating much today Hypoxic respiratory failure - likely multifactorial due to volume overload as well as possible consolidation on today's CXR, no CXR on admission though to compare. She has been on ceftriaxone up until yesterday and now on Ciprofloxacin for UTI, will change Antibiotics to Levofloxacin for better respiratory penetration as well as UTI treatment - clinically appears fluid overloaded still, repeat 20 IV Lasix today. Possible HCAP - Levofloxacin as above Hypokalemia - in the setting of Lasix use, supplement and recheck in am  Acute Kidney Injury   -Increasing Creatinine to 1.31 on 06/07/2014, now improved - Likely related to hypovolemia or ATN from hypotension. Improved urine output.  - ARB therapy discontinued  Urinary Tract Infection  - U/A with bacteria and leukocytes    - She was started on Rocephin 1 g IV q daily, now on Levofloxacin as above. Leukocytosis  -Likely secondary to acute pancreatitis and UTI  -Improving  -Will change to Cipro today  History of Hypertension  -Patient becoming hypotensive for which ARB was discontinued  Volume Overload  -Patient having increasing wheezes, complaining of shortness of breath and having difficulty laying flat.  -She was volume resuscitated during this hospitalization  -Will give 20 mg of IV lasix today, IV fluids were stopped.   Diet: Full liquid Fluids: none DVT Prophylaxis: heparin  Code Status: Full Family Communication: d/w patient  Disposition Plan: home when ready   Consultants:  None   Procedures:  None    Antibiotics Ceftriaxone 10/4 >> 10/6 Ciprofloxacin 10/6 >> 10/7 Levofloxacin 10/7 >>  Studies  Filed Vitals:   06/09/14 1427 06/09/14 2200 06/10/14 0546 06/10/14 1400  BP:  128/61 134/64 135/61  Pulse:  80 77 80  Temp: 98 F (36.7 C) 98.6 F (37 C) 98.3 F (36.8 C) 98.5 F (36.9 C)  TempSrc:  Oral Oral Oral  Resp:  18 18 18   Height:      Weight:      SpO2:  91% 93% 94%    Intake/Output Summary (Last 24 hours) at 06/10/14 1433 Last data filed at 06/10/14 1400  Gross per 24 hour  Intake   1040 ml  Output   1650 ml  Net   -610 ml   Filed Weights   06/06/14 0044  Weight: 83.915 kg (185 lb)    Exam:  General:  NAD  Cardiovascular: RRR  Respiratory: mild left basilar crackles  Abdomen: soft, tender to palpation  epigastric area  MSK: 1+ edema  Data Reviewed: Basic Metabolic Panel:  Recent Labs Lab 06/06/14 0137 06/07/14 0541 06/08/14 0525 06/09/14 0513 06/10/14 0455  NA 140 142 141 141 137  K 3.8 4.2 4.0 3.5* 3.2*  CL 106 109 109 107 102  CO2 21 21 19 22 24   GLUCOSE 116* 121* 90 110* 98  BUN 16 24* 26* 18 11  CREATININE 0.77 1.31* 1.05 0.81 0.66  CALCIUM 9.1 7.9* 7.8* 8.1* 8.5   Liver Function Tests:  Recent Labs Lab 06/06/14 0137  06/07/14 0541  AST 29 44*  ALT 29 44*  ALKPHOS 77 82  BILITOT 0.4 1.1  PROT 7.1 6.7  ALBUMIN 3.9 3.7    Recent Labs Lab 06/06/14 0137 06/07/14 0541 06/08/14 0525 06/09/14 0513  LIPASE >3000* 2084* 274* 49   CBC:  Recent Labs Lab 06/06/14 0137 06/07/14 0541 06/08/14 0525 06/09/14 0513 06/10/14 0455  WBC 11.3* 14.8* 16.9* 12.9* 14.7*  NEUTROABS 7.9*  --   --   --   --   HGB 14.4 13.3 12.4 10.6* 10.4*  HCT 42.9 42.4 39.9 32.7* 32.3*  MCV 89.9 95.7 96.8 93.7 92.3  PLT 212 220 195 213 PLATELET CLUMPS NOTED ON SMEAR, COUNT APPEARS ADEQUATE   Cardiac Enzymes:  Recent Labs Lab 06/06/14 0137  TROPONINI <0.30   CBG:  Recent Labs Lab 06/09/14 1222 06/09/14 1715 06/09/14 2306 06/10/14 0759 06/10/14 1224  GLUCAP 89 94 94 98 106*    Recent Results (from the past 240 hour(s))  URINE CULTURE     Status: None   Collection Time    06/07/14  6:13 AM      Result Value Ref Range Status   Specimen Description URINE, CLEAN CATCH   Final   Special Requests NONE   Final   Culture  Setup Time     Final   Value: 06/07/2014 14:13     Performed at [place]     Final   Value: >=100,000 COLONIES/ML     Performed at [facility]   Culture     Final   Value: Multiple bacterial morphotypes present, none predominant. Suggest appropriate recollection if clinically indicated.     Performed at [facility]   Report Status 06/08/2014 FINAL   Final  CULTURE, BLOOD (ROUTINE X 2)     Status: None   Collection Time    06/07/14  2:00 PM      Result Value Ref Range Status   Specimen Description BLOOD RIGHT ARM   Final   Special Requests     Final   Value: BOTTLES DRAWN AEROBIC AND ANAEROBIC 10CC BOTH BOTTLES   Culture  Setup Time     Final   Value: 06/07/2014 18:21     Performed at [facility]   Culture     Final   Value:        BLOOD CULTURE RECEIVED NO GROWTH TO DATE CULTURE WILL BE HELD FOR 5 DAYS BEFORE ISSUING A FINAL  NEGATIVE REPORT     Note: Culture results may be compromised due to an excessive volume of blood received in culture bottles.     Performed at [facility]   Report Status PENDING   Incomplete  CULTURE, BLOOD (ROUTINE X 2)     Status: None   Collection Time    06/07/14  2:07 PM      Result Value Ref Range Status   Specimen Description  BLOOD LEFT ARM   Final   Special Requests BOTTLES DRAWN AEROBIC ONLY 5CC BLUE BOTTLE   Final   Culture  Setup Time     Final   Value: 06/07/2014 18:21     Performed at [facility]   Culture     Final   Value:        BLOOD CULTURE RECEIVED NO GROWTH TO DATE CULTURE WILL BE HELD FOR 5 DAYS BEFORE ISSUING A FINAL NEGATIVE REPORT     Performed at [facility]   Report Status PENDING   Incomplete     Studies: US abdomen 10/3 Gallbladder surgically absent. Normal appearance of the liver and bile ducts. Segmental visualization of the pancreas with suggestion of mild ductal dilatation.  CT abdomen pelvis 10/4 Evidence patient's known acute pancreatitis without complicating features. Very small amount of bilateral pleural fluid left worse than right with associated atelectasis. Diverticulosis of the colon.  CXR 10/7 Small left pleural effusion with left base consolidation. Mild atelectatic change right base.  Scheduled Meds: . antiseptic oral rinse  7 mL Mouth Rinse BID  . furosemide  20 mg Intravenous Once  . heparin  5,000 Units Subcutaneous 3 times per day  . loratadine  10 mg Oral Daily  . potassium chloride  40 mEq Oral Q4H  . sertraline  100 mg Oral Daily   Continuous Infusions:   Principal Problem:   Acute recurrent pancreatitis Active Problems:   Pancreas divisum   Pancreatitis   Hypokalemia   Volume overload   HCAP (healthcare-associated pneumonia)   UTI (urinary tract infection)   Leukocytosis   Acute respiratory failure with hypoxia   Time spent: [place], MD [facility] Pager [phone]. If  7 PM - 7 AM, please contact night-coverage at [email or web address], password [facility] 06/10/2014, 2:33 PM  LOS: 4 days

## 2014-06-10 NOTE — Progress Notes (Signed)
ANTIBIOTIC CONSULT NOTE - INITIAL  Pharmacy Consult for Levaquin Indication: UTI, HCAP  Allergies  Allergen Reactions  . Codeine     GI upset  . Morphine And Related     "In a scarry movie" weird thoughts    Patient Measurements: Height: 5\' 1"  (154.9 cm) Weight: 185 lb (83.915 kg) IBW/kg (Calculated) : 47.8  Vital Signs: Temp: 98.5 F (36.9 C) (10/07 1400) Temp Source: Oral (10/07 1400) BP: 135/61 mmHg (10/07 1400) Pulse Rate: 80 (10/07 1400) Intake/Output from previous day: 10/06 0701 - 10/07 0700 In: 813.2 [P.O.:640; I.V.:173.2] Out: 3200 [Urine:3200]  Labs:  Recent Labs  06/08/14 0525 06/09/14 0513 06/10/14 0455  WBC 16.9* 12.9* 14.7*  HGB 12.4 10.6* 10.4*  PLT 195 213 PLATELET CLUMPS NOTED ON SMEAR, COUNT APPEARS ADEQUATE  CREATININE 1.05 0.81 0.66   Estimated Creatinine Clearance: 68.8 ml/min (by C-G formula based on Cr of 0.66).   Microbiology: Recent Results (from the past 720 hour(s))  URINE CULTURE     Status: None   Collection Time    06/07/14  6:13 AM      Result Value Ref Range Status   Specimen Description URINE, CLEAN CATCH   Final   Special Requests NONE   Final   Culture  Setup Time     Final   Value: 06/07/2014 14:13     Performed at [place]     Final   Value: >=100,000 COLONIES/ML     Performed at [facility]   Culture     Final   Value: Multiple bacterial morphotypes present, none predominant. Suggest appropriate recollection if clinically indicated.     Performed at [facility]   Report Status 06/08/2014 FINAL   Final  CULTURE, BLOOD (ROUTINE X 2)     Status: None   Collection Time    06/07/14  2:00 PM      Result Value Ref Range Status   Specimen Description BLOOD RIGHT ARM   Final   Special Requests     Final   Value: BOTTLES DRAWN AEROBIC AND ANAEROBIC 10CC BOTH BOTTLES   Culture  Setup Time     Final   Value: 06/07/2014 18:21     Performed at [facility]   Culture      Final   Value:        BLOOD CULTURE RECEIVED NO GROWTH TO DATE CULTURE WILL BE HELD FOR 5 DAYS BEFORE ISSUING A FINAL NEGATIVE REPORT     Note: Culture results may be compromised due to an excessive volume of blood received in culture bottles.     Performed at [facility]   Report Status PENDING   Incomplete  CULTURE, BLOOD (ROUTINE X 2)     Status: None   Collection Time    06/07/14  2:07 PM      Result Value Ref Range Status   Specimen Description BLOOD LEFT ARM   Final   Special Requests BOTTLES DRAWN AEROBIC ONLY 5CC BLUE BOTTLE   Final   Culture  Setup Time     Final   Value: 06/07/2014 18:21     Performed at [facility]   Culture     Final   Value:        BLOOD CULTURE RECEIVED NO GROWTH TO DATE CULTURE WILL BE HELD FOR 5 DAYS BEFORE ISSUING A FINAL NEGATIVE REPORT     Performed at [facility]   Report  Status PENDING   Incomplete    Medical History: Past Medical History  Diagnosis Date  . Depression   . Pancreatitis   . Hay fever   . Urine incontinence   . UTI (urinary tract infection)     reoccuring   . DEPRESSION 04/01/2010  . PANCREATITIS, HX OF 04/01/2010    Medications:  Anti-infectives   Start     Dose/Rate Route Frequency Ordered Stop   06/10/14 1600  levofloxacin (LEVAQUIN) IVPB 750 mg     750 mg 100 mL/hr over 90 Minutes Intravenous Every 24 hours 06/10/14 1440     06/09/14 1330  ciprofloxacin (CIPRO) tablet 500 mg  Status:  Discontinued     500 mg Oral 2 times daily 06/09/14 1245 06/10/14 1432   06/07/14 0600  cefTRIAXone (ROCEPHIN) 1 g in dextrose 5 % 50 mL IVPB  Status:  Discontinued     1 g 100 mL/hr over 30 Minutes Intravenous Every 24 hours 06/07/14 0559 06/09/14 1245     Assessment: HPI: 63 y.o. female with h/o recurrent pancreatitis due to pancreas divisum who presents to the ED with c/o abdominal pain. Pharmacy consulted to dose Rocephin for possible UTI initially.  Consult change to Levaquin for UTI + HCAP. Received a  dose of cipro this AM.  10/4 >> ceftriaxone >> 10/7 10/6 >> cipro >> 10/7 10/7 >> levofloxacin >>  Tmax: AF WBCs: elevated, 14.7 Renal: SCr 0.66, CrCl ~ 69 ml/min  10/4 urine: >100,000 Multiple bacterial morphotypes present, none predominant 10/4 blood x 2: NGTD  Goal of Therapy:  Appropriate abx dosing, eradication of infection.   Plan:   Levaquin 750mg  IV q24h.  Follow up renal function and cultures as available.  Gretta Arab PharmD, BCPS Pager [phone] 06/10/2014 3:01 PM

## 2014-06-10 NOTE — Progress Notes (Signed)
Patient oxygen drops when ambulating in hallway to mid 80s.  Dr Renne Crigler aware

## 2014-06-11 LAB — CBC
HCT: 33.9 % — ABNORMAL LOW (ref 36.0–46.0)
HEMOGLOBIN: 11.2 g/dL — AB (ref 12.0–15.0)
MCH: 29.3 pg (ref 26.0–34.0)
MCHC: 33 g/dL (ref 30.0–36.0)
MCV: 88.7 fL (ref 78.0–100.0)
Platelets: 267 10*3/uL (ref 150–400)
RBC: 3.82 MIL/uL — ABNORMAL LOW (ref 3.87–5.11)
RDW: 13.8 % (ref 11.5–15.5)
WBC: 13.5 10*3/uL — ABNORMAL HIGH (ref 4.0–10.5)

## 2014-06-11 LAB — GLUCOSE, CAPILLARY
GLUCOSE-CAPILLARY: 98 mg/dL (ref 70–99)
GLUCOSE-CAPILLARY: 95 mg/dL (ref 70–99)
GLUCOSE-CAPILLARY: 117 mg/dL — AB (ref 70–99)
Glucose-Capillary: 100 mg/dL — ABNORMAL HIGH (ref 70–99)

## 2014-06-11 LAB — CLOSTRIDIUM DIFFICILE BY PCR: CDIFFPCR: NEGATIVE

## 2014-06-11 LAB — BASIC METABOLIC PANEL
Anion gap: 13 (ref 5–15)
BUN: 10 mg/dL (ref 6–23)
CALCIUM: 8.6 mg/dL (ref 8.4–10.5)
CO2: 26 mEq/L (ref 19–32)
Chloride: 101 mEq/L (ref 96–112)
Creatinine, Ser: 0.62 mg/dL (ref 0.50–1.10)
GFR calc Af Amer: >90 mL/min (ref >90)
GLUCOSE: 99 mg/dL (ref 70–99)
Potassium: 3 mEq/L — ABNORMAL LOW (ref 3.7–5.3)
SODIUM: 140 meq/L (ref 137–147)

## 2014-06-11 MED ORDER — POTASSIUM CHLORIDE CRYS ER 20 MEQ PO TBCR
40.0000 meq | EXTENDED_RELEASE_TABLET | ORAL | Status: AC
  Administered 2014-06-11 (×2): 40 meq via ORAL
  Filled 2014-06-11 (×2): qty 2

## 2014-06-11 NOTE — Progress Notes (Signed)
Pt refused to have her IV restarted. Pt stated she wants to see if the doctor is going to release her tomorrow before she has to be restuck". Will continue to monitor.

## 2014-06-11 NOTE — Progress Notes (Signed)
PROGRESS NOTE  Linda Blackburn GLO:[id number] DOB: 02/11/1951 DOA: 06/06/2014 PCP: [name], MD  HPI: 63 year old female who was admitted to the medicine service on 06/06/2014 presenting with complaints of abdominal pain. She was found to have a lipase over 3000, admitted to [facility] where she was made n.p.o., administered IV fluids and as needed narcotic analgesics for acute pancreatitis. She has been improving from her pancreatitis standpoint however she was been having mild fluid overload.  Subjective/ 24 H Interval events - respiratory status much better, now on room air - with profuse diarrhea, states that has a history of an "infection" in the past when she lived in [place]  Assessment/Plan: Acute pancreatitis - Patient presenting with severe abdominal pain, found to have lipase greater than 3000. She improved with hydration and lipase is now within normal limits.  - Abdominal ultrasound confirm gallbladder surgically absent, normal appearance of the liver and bile ducts.  - CT scan showed Pancreatitis without complicating features.  - Triglycerides at 108  - Labs showing a downward trend in her lipase to 49 from >3000 on admission  - advance diet  Diarrhea - check C diff Hypoxic respiratory failure - likely multifactorial due to volume overload as well as possible consolidation on today's CXR, no CXR on admission though to compare. She has been on ceftriaxone up until yesterday and now on Ciprofloxacin for UTI, will change Antibiotics to Levofloxacin for better respiratory penetration as well as UTI treatment - s/p Lasix IV, on room air this morning Possible HCAP - Levofloxacin as above Hypokalemia - in the setting of Lasix use, supplement and recheck in am  Acute Kidney Injury   -Increasing Creatinine to 1.31 on 06/07/2014, now improved - Likely related to hypovolemia or ATN from hypotension. Improved urine output.  - ARB therapy discontinued  Urinary Tract Infection  - U/A  with bacteria and leukocytes  - She was started on Rocephin 1 g IV q daily, now on Levofloxacin as above. Leukocytosis  -Likely secondary to acute pancreatitis and UTI  -Improving  -Will change to Cipro today  History of Hypertension  -Patient becoming hypotensive for which ARB was discontinued  Volume Overload  - improving post diuresis, now on room air  Diet: Full liquid Fluids: none DVT Prophylaxis: heparin  Code Status: Full Family Communication: d/w patient  Disposition Plan: home when ready   Consultants:  None   Procedures:  None    Antibiotics Ceftriaxone 10/4 >> 10/6 Ciprofloxacin 10/6 >> 10/7 Levofloxacin 10/7 >>  Studies  Filed Vitals:   06/10/14 1400 06/10/14 2124 06/11/14 0423 06/11/14 1400  BP: 135/61 120/54 142/67 138/56  Pulse: 80 76 74 75  Temp: 98.5 F (36.9 C) 98.3 F (36.8 C) 98.4 F (36.9 C) 98.7 F (37.1 C)  TempSrc: Oral Oral Oral Oral  Resp: 18 16 16 18   Height:      Weight:      SpO2: 94% 92% 93% 93%    Intake/Output Summary (Last 24 hours) at 06/11/14 1505 Last data filed at 06/11/14 1400  Gross per 24 hour  Intake    720 ml  Output   2800 ml  Net  -2080 ml   Filed Weights   06/06/14 0044  Weight: 83.915 kg (185 lb)    Exam:  General:  NAD  Cardiovascular: RRR  Respiratory: mild left basilar crackles  Abdomen: soft, tender to palpation epigastric area  MSK: trace edema  Data Reviewed: Basic Metabolic Panel:  Recent Labs Lab 06/07/14 0541 06/08/14  1601 06/09/14 0513 06/10/14 0455 06/11/14 0422  NA 142 141 141 137 140  K 4.2 4.0 3.5* 3.2* 3.0*  CL 109 109 107 102 101  CO2 21 19 22 24 26   GLUCOSE 121* 90 110* 98 99  BUN 24* 26* 18 11 10   CREATININE 1.31* 1.05 0.81 0.66 0.62  CALCIUM 7.9* 7.8* 8.1* 8.5 8.6   Liver Function Tests:  Recent Labs Lab 06/06/14 0137 06/07/14 0541  AST 29 44*  ALT 29 44*  ALKPHOS 77 82  BILITOT 0.4 1.1  PROT 7.1 6.7  ALBUMIN 3.9 3.7    Recent Labs Lab  06/06/14 0137 06/07/14 0541 06/08/14 0525 06/09/14 0513  LIPASE >3000* 2084* 274* 49   CBC:  Recent Labs Lab 06/06/14 0137 06/07/14 0541 06/08/14 0525 06/09/14 0513 06/10/14 0455 06/11/14 0422  WBC 11.3* 14.8* 16.9* 12.9* 14.7* 13.5*  NEUTROABS 7.9*  --   --   --   --   --   HGB 14.4 13.3 12.4 10.6* 10.4* 11.2*  HCT 42.9 42.4 39.9 32.7* 32.3* 33.9*  MCV 89.9 95.7 96.8 93.7 92.3 88.7  PLT 212 220 195 213 PLATELET CLUMPS NOTED ON SMEAR, COUNT APPEARS ADEQUATE 267   Cardiac Enzymes:  Recent Labs Lab 06/06/14 0137  TROPONINI <0.30   CBG:  Recent Labs Lab 06/10/14 1224 06/10/14 1805 06/10/14 2122 06/11/14 0713 06/11/14 1258  GLUCAP 106* 114* 117* 98 95    Recent Results (from the past 240 hour(s))  URINE CULTURE     Status: None   Collection Time    06/07/14  6:13 AM      Result Value Ref Range Status   Specimen Description URINE, CLEAN CATCH   Final   Special Requests NONE   Final   Culture  Setup Time     Final   Value: 06/07/2014 14:13     Performed at [facility]     Final   Value: >=100,000 COLONIES/ML     Performed at [facility]   Culture     Final   Value: Multiple bacterial morphotypes present, none predominant. Suggest appropriate recollection if clinically indicated.     Performed at [facility]   Report Status 06/08/2014 FINAL   Final  CULTURE, BLOOD (ROUTINE X 2)     Status: None   Collection Time    06/07/14  2:00 PM      Result Value Ref Range Status   Specimen Description BLOOD RIGHT ARM   Final   Special Requests     Final   Value: BOTTLES DRAWN AEROBIC AND ANAEROBIC 10CC BOTH BOTTLES   Culture  Setup Time     Final   Value: 06/07/2014 18:21     Performed at [facility]   Culture     Final   Value:        BLOOD CULTURE RECEIVED NO GROWTH TO DATE CULTURE WILL BE HELD FOR 5 DAYS BEFORE ISSUING A FINAL NEGATIVE REPORT     Note: Culture results may be compromised due to an excessive  volume of blood received in culture bottles.     Performed at [facility]   Report Status PENDING   Incomplete  CULTURE, BLOOD (ROUTINE X 2)     Status: None   Collection Time    06/07/14  2:07 PM      Result Value Ref Range Status   Specimen Description BLOOD LEFT ARM   Final   Special  Requests BOTTLES DRAWN AEROBIC ONLY 5CC BLUE BOTTLE   Final   Culture  Setup Time     Final   Value: 06/07/2014 18:21     Performed at [facility]   Culture     Final   Value:        BLOOD CULTURE RECEIVED NO GROWTH TO DATE CULTURE WILL BE HELD FOR 5 DAYS BEFORE ISSUING A FINAL NEGATIVE REPORT     Performed at [facility]   Report Status PENDING   Incomplete  CLOSTRIDIUM DIFFICILE BY PCR     Status: None   Collection Time    06/11/14  7:12 AM      Result Value Ref Range Status   C difficile by pcr NEGATIVE  NEGATIVE Final   Comment: Performed at [facility]     Studies: US abdomen 10/3 Gallbladder surgically absent. Normal appearance of the liver and bile ducts. Segmental visualization of the pancreas with suggestion of mild ductal dilatation.  CT abdomen pelvis 10/4 Evidence patient's known acute pancreatitis without complicating features. Very small amount of bilateral pleural fluid left worse than right with associated atelectasis. Diverticulosis of the colon.  CXR 10/7 Small left pleural effusion with left base consolidation. Mild atelectatic change right base.  Scheduled Meds: . antiseptic oral rinse  7 mL Mouth Rinse BID  . heparin  5,000 Units Subcutaneous 3 times per day  . levofloxacin (LEVAQUIN) IV  750 mg Intravenous Q24H  . loratadine  10 mg Oral Daily  . potassium chloride  40 mEq Oral Q3H  . sertraline  100 mg Oral Daily   Continuous Infusions:   Principal Problem:   Acute recurrent pancreatitis Active Problems:   Pancreas divisum   Pancreatitis   Hypokalemia   Volume overload   HCAP (healthcare-associated pneumonia)   UTI (urinary tract  infection)   Leukocytosis   Acute respiratory failure with hypoxia   Time spent: [place], MD [facility] Pager [phone]. If 7 PM - 7 AM, please contact night-coverage at [email or web address], password [facility] 06/11/2014, 3:05 PM  LOS: 5 days

## 2014-06-12 LAB — BASIC METABOLIC PANEL
ANION GAP: 12 (ref 5–15)
BUN: 8 mg/dL (ref 6–23)
CALCIUM: 8.4 mg/dL (ref 8.4–10.5)
CO2: 28 mEq/L (ref 19–32)
CREATININE: 0.67 mg/dL (ref 0.50–1.10)
Chloride: 101 mEq/L (ref 96–112)
Glucose, Bld: 97 mg/dL (ref 70–99)
Potassium: 3 mEq/L — ABNORMAL LOW (ref 3.7–5.3)
SODIUM: 141 meq/L (ref 137–147)

## 2014-06-12 LAB — GLUCOSE, CAPILLARY: Glucose-Capillary: 93 mg/dL (ref 70–99)

## 2014-06-12 MED ORDER — LEVOFLOXACIN 750 MG PO TABS
750.0000 mg | ORAL_TABLET | Freq: Every day | ORAL | Status: DC
  Filled 2014-06-12: qty 1

## 2014-06-12 MED ORDER — OXYCODONE HCL 5 MG PO TABS: 5.0000 mg | ORAL_TABLET | Freq: Four times a day (QID) | ORAL | Status: DC | PRN

## 2014-06-12 MED ORDER — POTASSIUM CHLORIDE CRYS ER 20 MEQ PO TBCR
40.0000 meq | EXTENDED_RELEASE_TABLET | Freq: Once | ORAL | Status: AC
  Administered 2014-06-12: 40 meq via ORAL
  Filled 2014-06-12: qty 2

## 2014-06-12 MED ORDER — LEVOFLOXACIN 750 MG PO TABS: 750.0000 mg | ORAL_TABLET | Freq: Every day | ORAL | Status: DC

## 2014-06-12 MED ORDER — ONDANSETRON 4 MG PO TBDP: 4.0000 mg | ORAL_TABLET | Freq: Three times a day (TID) | ORAL | Status: DC | PRN

## 2014-06-12 MED ORDER — POTASSIUM CHLORIDE CRYS ER 20 MEQ PO TBCR: 20.0000 meq | EXTENDED_RELEASE_TABLET | Freq: Every day | ORAL | Status: DC

## 2014-06-12 NOTE — Discharge Summary (Signed)
Physician Discharge Summary  Linda Blackburn WJX:[id number] DOB: September 12, 1950 DOA: 06/06/2014  PCP: [name], MD  Admit date: 06/06/2014 Discharge date: 06/12/2014  Time spent: 45 minutes  Recommendations for Outpatient Follow-up:  1. Follow up with PCP in 1-2 weeks   Recommendations for primary care physician for things to follow:  Repeat BMP for K level  Discharge Diagnoses:  Principal Problem:   Acute recurrent pancreatitis Active Problems:   Pancreas divisum   Pancreatitis   Hypokalemia   Volume overload   HCAP (healthcare-associated pneumonia)   UTI (urinary tract infection)   Leukocytosis   Acute respiratory failure with hypoxia  Discharge Condition: stable  Diet recommendation: low fat  Filed Weights   06/06/14 0044  Weight: 83.915 kg (185 lb)   History of present illness:  Linda Blackburn is a 63 y.o. female with h/o recurrent pancreatitis due to pancreas divisum who presents to the ED with c/o abdominal pain. Located in her epigastric area, onset around 10 pm tonight, severe, associated with n/v, feels like all her other prior pancreatitis episodes she gets every 3-4 years for the past 25+ years. Lipase is over 3000, remainder of workup unremarkable.  Hospital Course:  Acute pancreatitis - Patient presenting with severe abdominal pain, found to have lipase greater than 3000. She was admitted and placed on bowel rest and aggressive IVF. She clinically started to show improvement and her lipase trended down to normal levels. Her diet was slowly advanced and prior to discharge she was tolerating a low fat diet well without significant nausea/vomiting or abdominal pain.  Hypoxic respiratory failure - after initial fluid resuscitation patient developed slight hypoxia requiring supplemental oxygen. A CXR confirmed this as well as possible consolidation. She was initially on Ceftriaxone for presumed UTI and this was eventually transitioned to Levofloxacin. Her respiratory  status has returned to normal with diuresis and antibiotics and prior to discharge patient was comfortable on room air and able to ambulate without dyspnea or chest pain.  Diarrhea - she experienced one day with several loose bowel movements, C diff was checked and was negative. Her diarrhea resolved without further interventions.  Possible HCAP - Levofloxacin as above, to complete 3 additional days as an outpatient.  Hypokalemia - in the setting of Lasix use as well as poor po intake initially, supplement and recheck in am. She has maintained low K levels which required daily supplementation. I will discharge her home on low dose of Potassium for the next week and have advised PCP follow up with repeat blood work. Acute Kidney Injury - Increasing Creatinine up to 1.31 in the setting of dehydration and hemoconcentration associated with her episode of acute pancreatitis, resolved with fluids and has remained stable while on Lasix.  Urinary Tract Infection - U/A with bacteria and leukocytes, cultures have remained negative Leukocytosis - Likely secondary to acute pancreatitis and UTI, improved.  History of Hypertension - further outpatient follow up Volume Overload   Procedures:  None    Consultations:  None   Discharge Exam: Filed Vitals:   06/11/14 0423 06/11/14 1400 06/11/14 2109 06/12/14 0528  BP: 142/67 138/56 131/57 141/53  Pulse: 74 75 69 65  Temp: 98.4 F (36.9 C) 98.7 F (37.1 C) 98.6 F (37 C) 98.6 F (37 C)  TempSrc: Oral Oral Oral Oral  Resp: 16 18 16 18   Height:      Weight:      SpO2: 93% 93% 93% 94%   General: NAD Cardiovascular: RRR Respiratory: CTA biL  Discharge Instructions     Medication List         cetirizine 10 MG tablet  Commonly known as:  ZYRTEC  Take 10 mg by mouth daily as needed for allergies.     famotidine 20 MG tablet  Commonly known as:  PEPCID  Take 20 mg by mouth daily as needed for heartburn.     levofloxacin 750 MG tablet    Commonly known as:  LEVAQUIN  Take 1 tablet (750 mg total) by mouth daily.     losartan 100 MG tablet  Commonly known as:  COZAAR  Take 1 tablet (100 mg total) by mouth daily.     ondansetron 4 MG disintegrating tablet  Commonly known as:  ZOFRAN ODT  Take 1 tablet (4 mg total) by mouth every 8 (eight) hours as needed for nausea or vomiting.     oxyCODONE 5 MG immediate release tablet  Commonly known as:  Oxy IR/ROXICODONE  Take 1 tablet (5 mg total) by mouth every 6 (six) hours as needed for severe pain.     potassium chloride SA 20 MEQ tablet  Commonly known as:  K-DUR,KLOR-CON  Take 1 tablet (20 mEq total) by mouth daily.     sertraline 100 MG tablet  Commonly known as:  ZOLOFT  Take 1 tablet (100 mg total) by mouth daily.     zolpidem 5 MG tablet  Commonly known as:  AMBIEN  Take 1 tablet (5 mg total) by mouth at bedtime as needed for sleep.           Follow-up Information   Follow up with [name], MD. Schedule an appointment as soon as possible for a visit in 1 week.   Specialty:  Family Medicine   Contact information:   [address] [place] Flake [place] [address] [phone]       The results of significant diagnostics from this hospitalization (including imaging, microbiology, ancillary and laboratory) are listed below for reference.    Significant Diagnostic Studies: Ct Abdomen Pelvis Wo Contrast  06/07/2014   ADDENDUM REPORT: 06/07/2014 16:37  ADDENDUM: After unsuccessful attempts to reach Dr. Vanessa Barbara, these results were called by telephone at the time of interpretation on 06/07/2014 at 4:35 pm to ptient's nurse, Alda Berthold, who verbally acknowledged these results.   Electronically Signed   By: Elberta Fortis M.D.   On: 06/07/2014 16:37   06/07/2014   CLINICAL DATA:  Generalized abdominal pain and tenderness. History of acute pancreatitis and appendectomy. Previous cholecystectomy.  EXAM: CT ABDOMEN AND PELVIS WITHOUT CONTRAST. No IV contrast per  ordering physician due to worsening creatinine.  TECHNIQUE: Multidetector CT imaging of the abdomen and pelvis was performed following the standard protocol without IV contrast.  COMPARISON:  Right upper quadrant ultrasound 06/06/2014  FINDINGS: Lung bases demonstrate a small left pleural effusion and tiny amount of right pleural fluid. There is associated atelectasis in the lung bases. Two small calcified granulomas present over the right lower lobe.  Abdominal images demonstrate evidence of previous cholecystectomy. There is moderate inflammatory change and fluid within the peripancreatic fat compatible with patient's known acute pancreatitis. No definite evidence of pancreatic necrosis or pseudocyst formation. Minimal free fluid in the pericolic gutters.  The liver, spleen and adrenal glands are within normal. Kidneys are normal size without hydronephrosis or nephrolithiasis. Ureters are normal. There is mild diverticulosis of the colon. Surgical absence of the appendix. Retro aortic left renal vein is present.  Pelvic images demonstrate mild amount of free  fluid. There is surgical absence of the uterus. Bladder and rectum are within normal. Mild degenerate change of the spine and hips.  IMPRESSION: Evidence patient's known acute pancreatitis without complicating features.  Very small amount of bilateral pleural fluid left worse than right with associated atelectasis.  Diverticulosis of the colon.  Postsurgical findings as described.  Electronically Signed: By: Elberta Fortis M.D. On: 06/07/2014 15:46   Dg Chest 2 View  06/10/2014   CLINICAL DATA:  Recent onset fever and difficulty breathing  EXAM: CHEST  2 VIEW  COMPARISON:  None.  FINDINGS: There is a small left effusion with consolidation in the left base. There is mild right base atelectatic change. Elsewhere lungs are clear. Heart is upper normal in size with pulmonary vascularity within normal limits. No adenopathy. No bone lesions.  IMPRESSION: Small  left pleural effusion with left base consolidation. Mild atelectatic change right base.   Electronically Signed   By: Bretta Bang M.D.   On: 06/10/2014 13:57   US Abdomen Complete  06/06/2014   CLINICAL DATA:  Recurrent pancreatitis.  EXAM: ULTRASOUND ABDOMEN COMPLETE  COMPARISON:  None.  FINDINGS: Gallbladder: Gallbladder is surgically absent.  Common bile duct: Diameter: 5.1 mm, normal  Liver: No focal lesion identified. Within normal limits in parenchymal echogenicity.  IVC: Not visualized due to overlying bowel gas.  Pancreas: Limited visualization due to overlying bowel gas. Segments of the head and body are demonstrated and a mildly dilated bile duct at 4 mm is appreciated. This is compatible with history of pancreatitis although nonspecific.  Spleen: Size and appearance within normal limits.  Right Kidney: Length: 10.8 cm. Echogenicity within normal limits. No mass or hydronephrosis visualized.  Left Kidney: Length: 10.5 cm. Echogenicity within normal limits. No mass or hydronephrosis visualized.  Abdominal aorta: Proximal abdominal aorta only is visualized and is normal caliber. Mid and distal aorta is obscured by overlying bowel gas.  Other findings: None.  IMPRESSION: Gallbladder surgically absent. Normal appearance of the liver and bile ducts. Segmental visualization of the pancreas with suggestion of mild ductal dilatation.   Electronically Signed   By: Burman Nieves M.D.   On: 06/06/2014 06:35    Microbiology: Recent Results (from the past 240 hour(s))  URINE CULTURE     Status: None   Collection Time    06/07/14  6:13 AM      Result Value Ref Range Status   Specimen Description URINE, CLEAN CATCH   Final   Special Requests NONE   Final   Culture  Setup Time     Final   Value: 06/07/2014 14:13     Performed at [facility] Count     Final   Value: >=100,000 COLONIES/ML     Performed at [facility]   Culture     Final   Value: Multiple bacterial  morphotypes present, none predominant. Suggest appropriate recollection if clinically indicated.     Performed at [facility]   Report Status 06/08/2014 FINAL   Final  CULTURE, BLOOD (ROUTINE X 2)     Status: None   Collection Time    06/07/14  2:00 PM      Result Value Ref Range Status   Specimen Description BLOOD RIGHT ARM   Final   Special Requests     Final   Value: BOTTLES DRAWN AEROBIC AND ANAEROBIC 10CC BOTH BOTTLES   Culture  Setup Time     Final   Value: 06/07/2014 18:21  Performed at [facility]     Final   Value:        BLOOD CULTURE RECEIVED NO GROWTH TO DATE CULTURE WILL BE HELD FOR 5 DAYS BEFORE ISSUING A FINAL NEGATIVE REPORT     Note: Culture results may be compromised due to an excessive volume of blood received in culture bottles.     Performed at [facility]   Report Status PENDING   Incomplete  CULTURE, BLOOD (ROUTINE X 2)     Status: None   Collection Time    06/07/14  2:07 PM      Result Value Ref Range Status   Specimen Description BLOOD LEFT ARM   Final   Special Requests BOTTLES DRAWN AEROBIC ONLY 5CC BLUE BOTTLE   Final   Culture  Setup Time     Final   Value: 06/07/2014 18:21     Performed at [facility]   Culture     Final   Value:        BLOOD CULTURE RECEIVED NO GROWTH TO DATE CULTURE WILL BE HELD FOR 5 DAYS BEFORE ISSUING A FINAL NEGATIVE REPORT     Performed at [facility]   Report Status PENDING   Incomplete  CLOSTRIDIUM DIFFICILE BY PCR     Status: None   Collection Time    06/11/14  7:12 AM      Result Value Ref Range Status   C difficile by pcr NEGATIVE  NEGATIVE Final   Comment: Performed at [facility]     Labs: Basic Metabolic Panel:  Recent Labs Lab 06/08/14 0525 06/09/14 0513 06/10/14 0455 06/11/14 0422 06/12/14 0517  NA 141 141 137 140 141  K 4.0 3.5* 3.2* 3.0* 3.0*  CL 109 107 102 101 101  CO2 19 22 24 26 28   GLUCOSE 90 110* 98 99 97  BUN 26* 18 11 10 8     CREATININE 1.05 0.81 0.66 0.62 0.67  CALCIUM 7.8* 8.1* 8.5 8.6 8.4   Liver Function Tests:  Recent Labs Lab 06/06/14 0137 06/07/14 0541  AST 29 44*  ALT 29 44*  ALKPHOS 77 82  BILITOT 0.4 1.1  PROT 7.1 6.7  ALBUMIN 3.9 3.7    Recent Labs Lab 06/06/14 0137 06/07/14 0541 06/08/14 0525 06/09/14 0513  LIPASE >3000* 2084* 274* 49   CBC:  Recent Labs Lab 06/06/14 0137 06/07/14 0541 06/08/14 0525 06/09/14 0513 06/10/14 0455 06/11/14 0422  WBC 11.3* 14.8* 16.9* 12.9* 14.7* 13.5*  NEUTROABS 7.9*  --   --   --   --   --   HGB 14.4 13.3 12.4 10.6* 10.4* 11.2*  HCT 42.9 42.4 39.9 32.7* 32.3* 33.9*  MCV 89.9 95.7 96.8 93.7 92.3 88.7  PLT 212 220 195 213 PLATELET CLUMPS NOTED ON SMEAR, COUNT APPEARS ADEQUATE 267   Cardiac Enzymes:  Recent Labs Lab 06/06/14 0137  TROPONINI <0.30   CBG:  Recent Labs Lab 06/11/14 0713 06/11/14 1258 06/11/14 1741 06/11/14 2108 06/12/14 0719  GLUCAP 98 95 117* 100* 93   Signed:  Aries Townley  [facility] 06/12/2014, 4:30 PM

## 2014-06-12 NOTE — Discharge Instructions (Signed)

## 2014-06-12 NOTE — Progress Notes (Signed)
BRIEF PHARMACY NOTE  This patient is receiving Levaquin 750mg  IV Q24H. Based on criteria approved by the Pharmacy and Therapeutics Committee, this medication is being converted to the equivalent oral dose form. These criteria include:   . The patient is eating (either orally or per tube) and/or has been taking other orally administered medications for at least 24 hours.  . This patient has no evidence of active gastrointestinal bleeding or impaired GI absorption (gastrectomy, short bowel, patient on TNA or NPO).   If you have questions about this conversion, please contact the pharmacy department.  Kizzie Furnish, PharmD Pager: [phone] 06/12/2014 8:00 AM

## 2014-06-12 NOTE — Progress Notes (Signed)
Patient given prescriptions and discharge instructions.  States she has no questions.  Await transportation home

## 2014-06-13 LAB — CULTURE, BLOOD (ROUTINE X 2)
Culture: NO GROWTH
Culture: NO GROWTH

## 2014-06-16 ENCOUNTER — Ambulatory Visit (INDEPENDENT_AMBULATORY_CARE_PROVIDER_SITE_OTHER): Payer: Medicare Other | Admitting: Family Medicine

## 2014-06-16 ENCOUNTER — Encounter: Payer: Self-pay | Admitting: Family Medicine

## 2014-06-16 VITALS — BP 130/78 | HR 82 | Temp 98.0°F | Wt 210.0 lb

## 2014-06-16 DIAGNOSIS — E876 Hypokalemia: Secondary | ICD-10-CM | POA: Diagnosis not present

## 2014-06-16 DIAGNOSIS — G4700 Insomnia, unspecified: Secondary | ICD-10-CM

## 2014-06-16 DIAGNOSIS — Q453 Other congenital malformations of pancreas and pancreatic duct: Secondary | ICD-10-CM

## 2014-06-16 DIAGNOSIS — Z23 Encounter for immunization: Secondary | ICD-10-CM

## 2014-06-16 DIAGNOSIS — F5104 Psychophysiologic insomnia: Secondary | ICD-10-CM

## 2014-06-16 LAB — BASIC METABOLIC PANEL
BUN: 8 mg/dL (ref 6–23)
CALCIUM: 9.3 mg/dL (ref 8.4–10.5)
CO2: 25 meq/L (ref 19–32)
Chloride: 104 mEq/L (ref 96–112)
Creatinine, Ser: 0.8 mg/dL (ref 0.4–1.2)
GFR: 74.26 mL/min (ref >60.00)
Glucose, Bld: 93 mg/dL (ref 70–99)
Potassium: 4.4 mEq/L (ref 3.5–5.1)
Sodium: 139 mEq/L (ref 135–145)

## 2014-06-16 MED ORDER — OXYCODONE HCL 5 MG PO TABS: 5.0000 mg | ORAL_TABLET | Freq: Four times a day (QID) | ORAL | Status: DC | PRN

## 2014-06-16 MED ORDER — FLUCONAZOLE 150 MG PO TABS: 150.0000 mg | ORAL_TABLET | Freq: Once | ORAL | Status: DC

## 2014-06-16 MED ORDER — ZOLPIDEM TARTRATE 5 MG PO TABS: 5.0000 mg | ORAL_TABLET | Freq: Every evening | ORAL | Status: DC | PRN

## 2014-06-16 NOTE — Progress Notes (Signed)
Subjective:    Patient ID: Linda Blackburn, female    DOB: 12-07-1950, 63 y.o.   MRN: [id number]  HPI Patient seen for hospital follow up for acute pancreatitis. She has history of pancreas devisum and states she's had 6 total flareups In her lifetime and the most recent was 3 one half years ago- prior to this most current one. She's had previous cholecystectomy. No history of hypertriglyceridemia. Rare alcohol use. She had ERCP when she was in [place] which she estimates was December 2009 and apparently there is attempt at papillary intervention at that time.  She was admitted on October 3 discharged October 9th.    She had initial dehydration and was resuscitated by IV fluids. Initial lipase over 3000 and quickly returned to normal. She had some hypokalemia with discharge potassium 3.0 day of discharge. She's taking potassium supplement. She is slowly advancing diet. Still has poor appetite and some nausea but no vomiting. Other issue during hospitalization was possible UTI. She was treated initially with ceftriaxone and transitioned to Levaquin. She also had left consolidation with ? Of pneumonia.  She had some low-grade fevers off and on.  She had some diarrhea and evaluation for C. difficile negative. She has chronic history of intermittent diarrhea. She thinks she may have IBS but has never been formally diagnosed with this. She has never had screening colonoscopy. She's had steady weight gain over the past few years.  Past Medical History  Diagnosis Date  . Depression   . Pancreatitis   . Hay fever   . Urine incontinence   . UTI (urinary tract infection)     reoccuring   . DEPRESSION 04/01/2010  . PANCREATITIS, HX OF 04/01/2010   Past Surgical History  Procedure Laterality Date  . Appendectomy    . Cholecystectomy    . Tonsillectomy    . Abdominal hysterectomy      fibroids    reports that she has never smoked. She does not have any smokeless tobacco history on file. She reports  that she drinks alcohol. She reports that she does not use illicit drugs. family history includes Alcohol abuse in her maternal uncle and mother; Cancer in her father and paternal grandfather; Mental illness in her maternal aunt; Stroke in her maternal aunt. Allergies  Allergen Reactions  . Codeine     GI upset  . Morphine And Related     "In a scarry movie" weird thoughts      Review of Systems  Constitutional: Negative for fever and chills.  Respiratory: Negative for cough and shortness of breath.   Cardiovascular: Negative for chest pain.  Gastrointestinal: Positive for nausea and diarrhea. Negative for vomiting and abdominal distention.       Objective:   Physical Exam  Constitutional: She appears well-developed and well-nourished.  Cardiovascular: Normal rate and regular rhythm.   Pulmonary/Chest: Effort normal and breath sounds normal. No respiratory distress. She has no wheezes. She has no rales.  Abdominal: Soft. Bowel sounds are normal. She exhibits no distension.  She has diffuse tenderness throughout the upper abdomen also mid quadrants bilaterally. No guarding No mass.          Assessment & Plan:  Pancreas devisum with recurrent pancreatitis. She's had previous endoscopic evaluation but this was about 2009. Patient requesting GI referral which seems reasonable. She is interested in assessing whether any GI intervention could reduce future flare ups. She does not have any other obvious risk factors for recurrent pancreatitis. Previous cholecystectomy.  She  is slowly improving. She's also had previous screening colonoscopy and has chronic intermittent diarrhea which may be IBS related.  Hypokalemia. Recheck basic metabolic panel

## 2014-06-16 NOTE — Progress Notes (Signed)
Pre visit review using our clinic review tool, if applicable. No additional management support is needed unless otherwise documented below in the visit note. 

## 2014-06-30 ENCOUNTER — Other Ambulatory Visit: Payer: Self-pay | Admitting: Internal Medicine

## 2014-07-14 ENCOUNTER — Other Ambulatory Visit (INDEPENDENT_AMBULATORY_CARE_PROVIDER_SITE_OTHER): Payer: Medicare Other

## 2014-07-14 DIAGNOSIS — Z79899 Other long term (current) drug therapy: Secondary | ICD-10-CM | POA: Diagnosis not present

## 2014-07-14 DIAGNOSIS — Z0000 Encounter for general adult medical examination without abnormal findings: Secondary | ICD-10-CM

## 2014-07-14 DIAGNOSIS — I10 Essential (primary) hypertension: Secondary | ICD-10-CM | POA: Diagnosis not present

## 2014-07-14 DIAGNOSIS — Z79891 Long term (current) use of opiate analgesic: Secondary | ICD-10-CM | POA: Diagnosis not present

## 2014-07-14 LAB — BASIC METABOLIC PANEL
BUN: 13 mg/dL (ref 6–23)
CHLORIDE: 109 meq/L (ref 96–112)
CO2: 19 mEq/L (ref 19–32)
CREATININE: 0.9 mg/dL (ref 0.4–1.2)
Calcium: 9.3 mg/dL (ref 8.4–10.5)
GFR: 65.83 mL/min (ref >60.00)
Glucose, Bld: 99 mg/dL (ref 70–99)
Potassium: 3.6 mEq/L (ref 3.5–5.1)
Sodium: 140 mEq/L (ref 135–145)

## 2014-07-14 LAB — LIPID PANEL
CHOLESTEROL: 199 mg/dL (ref 0–200)
HDL: 35.5 mg/dL — AB (ref >39.00)
LDL Cholesterol: 126 mg/dL — ABNORMAL HIGH (ref 0–99)
NonHDL: 163.5
Total CHOL/HDL Ratio: 6
Triglycerides: 187 mg/dL — ABNORMAL HIGH (ref 0.0–149.0)
VLDL: 37.4 mg/dL (ref 0.0–40.0)

## 2014-07-14 LAB — CBC WITH DIFFERENTIAL/PLATELET
Basophils Absolute: 0.1 10*3/uL (ref 0.0–0.1)
Basophils Relative: 0.9 % (ref 0.0–3.0)
EOS ABS: 0.1 10*3/uL (ref 0.0–0.7)
EOS PCT: 1.9 % (ref 0.0–5.0)
HCT: 44.2 % (ref 36.0–46.0)
Hemoglobin: 14.6 g/dL (ref 12.0–15.0)
Lymphocytes Relative: 43 % (ref 12.0–46.0)
Lymphs Abs: 2.8 10*3/uL (ref 0.7–4.0)
MCHC: 33 g/dL (ref 30.0–36.0)
MCV: 88.4 fl (ref 78.0–100.0)
MONO ABS: 0.4 10*3/uL (ref 0.1–1.0)
Monocytes Relative: 6.8 % (ref 3.0–12.0)
Neutro Abs: 3.1 10*3/uL (ref 1.4–7.7)
Neutrophils Relative %: 47.4 % (ref 43.0–77.0)
PLATELETS: 280 10*3/uL (ref 150.0–400.0)
RBC: 5 Mil/uL (ref 3.87–5.11)
RDW: 13.8 % (ref 11.5–15.5)
WBC: 6.6 10*3/uL (ref 4.0–10.5)

## 2014-07-14 LAB — HEPATIC FUNCTION PANEL
ALK PHOS: 71 U/L (ref 39–117)
ALT: 33 U/L (ref 0–35)
AST: 27 U/L (ref 0–37)
Albumin: 3.7 g/dL (ref 3.5–5.2)
BILIRUBIN TOTAL: 0.6 mg/dL (ref 0.2–1.2)
Bilirubin, Direct: 0 mg/dL (ref 0.0–0.3)
Total Protein: 7.5 g/dL (ref 6.0–8.3)

## 2014-07-14 LAB — POCT URINALYSIS DIPSTICK
Glucose, UA: NEGATIVE
KETONES UA: NEGATIVE
Nitrite, UA: NEGATIVE
RBC UA: NEGATIVE
SPEC GRAV UA: 1.015
Urobilinogen, UA: 0.2
pH, UA: 5.5

## 2014-07-14 LAB — TSH: TSH: 1.42 u[IU]/mL (ref 0.35–4.50)

## 2014-07-15 ENCOUNTER — Encounter: Payer: Self-pay | Admitting: Gastroenterology

## 2014-07-20 ENCOUNTER — Encounter: Payer: Self-pay | Admitting: Family Medicine

## 2014-07-20 ENCOUNTER — Ambulatory Visit (INDEPENDENT_AMBULATORY_CARE_PROVIDER_SITE_OTHER): Payer: Medicare Other | Admitting: Family Medicine

## 2014-07-20 VITALS — BP 130/76 | HR 98 | Temp 98.5°F | Ht 60.0 in | Wt 201.0 lb

## 2014-07-20 DIAGNOSIS — Z23 Encounter for immunization: Secondary | ICD-10-CM

## 2014-07-20 DIAGNOSIS — Z0000 Encounter for general adult medical examination without abnormal findings: Secondary | ICD-10-CM | POA: Diagnosis not present

## 2014-07-20 DIAGNOSIS — R490 Dysphonia: Secondary | ICD-10-CM

## 2014-07-20 DIAGNOSIS — E669 Obesity, unspecified: Secondary | ICD-10-CM

## 2014-07-20 DIAGNOSIS — B356 Tinea cruris: Secondary | ICD-10-CM | POA: Diagnosis not present

## 2014-07-20 MED ORDER — LOSARTAN POTASSIUM 100 MG PO TABS: 100.0000 mg | ORAL_TABLET | Freq: Every day | ORAL | Status: DC

## 2014-07-20 MED ORDER — ONDANSETRON 4 MG PO TBDP: 4.0000 mg | ORAL_TABLET | Freq: Three times a day (TID) | ORAL | Status: DC | PRN

## 2014-07-20 MED ORDER — PANTOPRAZOLE SODIUM 40 MG PO TBEC: 40.0000 mg | DELAYED_RELEASE_TABLET | Freq: Every day | ORAL | Status: DC

## 2014-07-20 MED ORDER — SERTRALINE HCL 100 MG PO TABS: 100.0000 mg | ORAL_TABLET | Freq: Every day | ORAL | Status: DC

## 2014-07-20 MED ORDER — NYSTATIN 100000 UNIT/GM EX CREA: 1.0000 "application " | TOPICAL_CREAM | Freq: Two times a day (BID) | CUTANEOUS | Status: DC

## 2014-07-20 MED ORDER — FLUCONAZOLE 100 MG PO TABS: 100.0000 mg | ORAL_TABLET | Freq: Every day | ORAL | Status: DC

## 2014-07-20 NOTE — Progress Notes (Signed)
Pre visit review using our clinic review tool, if applicable. No additional management support is needed unless otherwise documented below in the visit note. 

## 2014-07-20 NOTE — Patient Instructions (Addendum)
Take pantoprazole 40 mg once daily for the next several weeks. Please call me in 3-4 weeks if her hoarseness is not fully resolved Keep groin area as dry as possible Use nystatin cream twice daily. Be in touch if rash not clearing over the next few weeks

## 2014-07-20 NOTE — Progress Notes (Signed)
Subjective:    Patient ID: Linda Blackburn, female    DOB: 10/02/1950, 63 y.o.   MRN: [id number]  HPI Patient here for complete physical. She also has a couple of acute issues that she wishes to have dressed separate from her physical.  She's had previous hysterectomy. Her chronic problems include history of obesity, hypertension, depression, pancreas devisum. She had recurrent pancreatitis episode over a month ago and is still recovering from that. She's not had any recurrent symptoms of nausea/vomiting/abdominal pain. She has had some hoarseness after violent vomiting for couple of days. She's taking Pepcid. She has never smoked. Denies any ongoing reflux symptoms.  No PND symptoms.  Pruritic rash in her groin area bilaterally. She tried water repellent type of ointment such as Desitin without improvement.  Moderately pruritic.  Past Medical History  Diagnosis Date  . Depression   . Pancreatitis   . Hay fever   . Urine incontinence   . UTI (urinary tract infection)     reoccuring   . DEPRESSION 04/01/2010  . PANCREATITIS, HX OF 04/01/2010   Past Surgical History  Procedure Laterality Date  . Appendectomy    . Cholecystectomy    . Tonsillectomy    . Abdominal hysterectomy      fibroids    reports that she has never smoked. She does not have any smokeless tobacco history on file. She reports that she drinks alcohol. She reports that she does not use illicit drugs. family history includes Alcohol abuse in her maternal uncle and mother; Cancer in her father and paternal grandfather; Mental illness in her maternal aunt; Stroke in her maternal aunt. Allergies  Allergen Reactions  . Codeine     GI upset  . Morphine And Related     "In a scarry movie" weird thoughts      Review of Systems  Constitutional: Negative for fever, activity change, appetite change, fatigue and unexpected weight change.  HENT: Positive for voice change. Negative for ear pain, hearing loss, postnasal drip,  sore throat and trouble swallowing.   Eyes: Negative for visual disturbance.  Respiratory: Negative for cough and shortness of breath.   Cardiovascular: Negative for chest pain and palpitations.  Gastrointestinal: Negative for abdominal pain, diarrhea, constipation and blood in stool.  Endocrine: Negative for polydipsia and polyuria.  Genitourinary: Negative for dysuria and hematuria.  Musculoskeletal: Negative for myalgias, back pain and arthralgias.  Skin: Positive for rash.  Neurological: Negative for dizziness, syncope and headaches.  Hematological: Negative for adenopathy.  Psychiatric/Behavioral: Negative for confusion and dysphoric mood.       Objective:   Physical Exam  Constitutional: She is oriented to person, place, and time. She appears well-developed and well-nourished.  HENT:  Head: Normocephalic and atraumatic.  Eyes: EOM are normal. Pupils are equal, round, and reactive to light.  Neck: Normal range of motion. Neck supple. No thyromegaly present.  Cardiovascular: Normal rate, regular rhythm and normal heart sounds.   No murmur heard. Pulmonary/Chest: Breath sounds normal. No respiratory distress. She has no wheezes. She has no rales.  Abdominal: Soft. Bowel sounds are normal. She exhibits no distension and no mass. There is no tenderness. There is no rebound and no guarding.  Genitourinary:  Breasts are symmetric with no mass. No nipple inversion. No skin dimpling.  Musculoskeletal: Normal range of motion. She exhibits no edema.  Lymphadenopathy:    She has no cervical adenopathy.  Neurological: She is alert and oriented to person, place, and time. She displays normal reflexes.  No cranial nerve deficit.  Skin: Rash noted.  Macular erythematous slightly scaly rash left inguinal region with minimal involvement right inguinal region  Psychiatric: She has a normal mood and affect. Her behavior is normal. Judgment and thought content normal.          Assessment &  Plan:  Complete physical. No Pap smears since she's had previous hysterectomy. Mammograms up-to-date. Prevnar 13 given and she will need 23 valent pneumococcal vaccine in 1 year. She is encouraged to lose some weight.  Hoarseness. Probably related to recurrent vomiting from recent pancreatitis episode. Recommend trial of protonix 40 mg once daily and ENT referral no better in 3-4 weeks  Tinea cruris. Most likely fungal. Keep area dry. Nystatin cream twice daily  History of pancreas devisum with recurrent pancreatitis.  Stable symptomatically at this time. She has follow-up scheduled in January with GI

## 2014-08-10 ENCOUNTER — Other Ambulatory Visit: Payer: Self-pay | Admitting: Family Medicine

## 2014-08-10 ENCOUNTER — Telehealth: Payer: Self-pay | Admitting: Family Medicine

## 2014-08-10 DIAGNOSIS — R490 Dysphonia: Secondary | ICD-10-CM

## 2014-08-10 NOTE — Telephone Encounter (Signed)
Pt saw dr on 11/16 for cpe. However pt has a voice issue and instructed to cb if it was not better in 3-4 weeks for a referral to ENT. Pt is not better and would like that referral asap

## 2014-08-10 NOTE — Telephone Encounter (Signed)
Referral is ordered

## 2014-08-10 NOTE — Telephone Encounter (Signed)
Set up to see Dr Radene Journey

## 2014-08-11 ENCOUNTER — Telehealth: Payer: Self-pay | Admitting: Family Medicine

## 2014-08-11 MED ORDER — PANTOPRAZOLE SODIUM 40 MG PO TBEC: 40.0000 mg | DELAYED_RELEASE_TABLET | Freq: Every day | ORAL | Status: DC

## 2014-08-11 NOTE — Telephone Encounter (Signed)
Rx sent and patient is aware.  She has a follow up with ENT tomorrow.

## 2014-08-11 NOTE — Telephone Encounter (Signed)
Pt is due for a refill of pantoprazole (PROTONIX) 40 MG tablet (this was for her voice.) Would like to know if you want her to continue this rx?

## 2014-08-11 NOTE — Telephone Encounter (Signed)
Yes.  At least until follow up with ENT.  Refill times 3.

## 2014-08-12 ENCOUNTER — Other Ambulatory Visit: Payer: Self-pay | Admitting: Family Medicine

## 2014-08-12 DIAGNOSIS — R490 Dysphonia: Secondary | ICD-10-CM | POA: Diagnosis not present

## 2014-08-14 ENCOUNTER — Encounter: Payer: Self-pay | Admitting: Family Medicine

## 2014-09-03 ENCOUNTER — Telehealth: Payer: Self-pay | Admitting: Family Medicine

## 2014-09-03 NOTE — Telephone Encounter (Signed)
PLEASE NOTE: All timestamps contained within this report are represented as [place] Standard Time. CONFIDENTIALTY NOTICE: This fax transmission is intended only for the addressee. It contains information that is legally privileged, confidential or otherwise protected from use or disclosure. If you are not the intended recipient, you are strictly prohibited from reviewing, disclosing, copying using or disseminating any of this information or taking any action in reliance on or regarding this information. If you have received this fax in error, please notify us immediately by telephone so that we can arrange for its return to [place]. Phone: [phone], Toll-Free: [phone], Fax: [phone] Page: 1 of 2 Call Id: [id number] [facility] - Client [place] Patient Name: Linda Blackburn Gender: Female DOB: [birth year] Age: 1 Y 62 M 2 D Return Phone Number: [phone] (Primary) Address: [address] City/State/Zip: [place] [address] Client [facility] - Client Client Site [place] - Day Physician Carolann Littler Contact Type Call Call Type Triage / Clinical Relationship To Patient Self Return Phone Number [phone] (Primary) Chief Complaint Cold Symptom Initial Comment Caller states she has been on mucinex-d for cold for a week. Is out of town, wants to know what she can do, or if something can be called in. PreDisposition Home Care Nurse Assessment Nurse: Dimas Chyle, RN, Dellis Filbert Date/Time Eilene Ghazi Time): 09/03/2014 12:02:41 PM Confirm and document reason for call. If symptomatic, describe symptoms. ---Caller states she has been on mucinex-d for cold for a week. Is out of town, wants to know what she can do, or if something can be called in. Coughing. No fever. Has the patient traveled out of the country within the last 30 days? ---No Does the  patient require triage? ---Yes Related visit to physician within the last 2 weeks? ---Yes Does the PT have any chronic conditions? (i.e. diabetes, asthma, etc.) ---Yes List chronic conditions. ---HTN Guidelines Guideline Title Affirmed Question Affirmed Notes Nurse Date/Time Eilene Ghazi Time) Cough - Acute Productive Cough (all triage questions negative) Dimas Chyle, RN, Dellis Filbert 09/03/2014 12:04:51 PM Disp. Time Eilene Ghazi Time) Disposition Final User 09/03/2014 12:09:04 PM Home Care Yes Dimas Chyle, RN, Guerry Minors Understands: Yes Disagree/Comply: Comply Care Advice Given Per Guideline PLEASE NOTE: All timestamps contained within this report are represented as [place] Standard Time. CONFIDENTIALTY NOTICE: This fax transmission is intended only for the addressee. It contains information that is legally privileged, confidential or otherwise protected from use or disclosure. If you are not the intended recipient, you are strictly prohibited from reviewing, disclosing, copying using or disseminating any of this information or taking any action in reliance on or regarding this information. If you have received this fax in error, please notify us immediately by telephone so that we can arrange for its return to [place]. Phone: [phone], Toll-Free: [phone], Fax: [phone] Page: 2 of 2 Call Id: [id number] Care Advice Given Per Guideline HOME CARE: You should be able to treat this at home. REASSURANCE: * It doesn't sound like a serious cough. * Coughing up mucus is very important for protecting the lungs from pneumonia. COUGH MEDICINES: - OTC COUGH SYRUPS: The most common cough suppressant in OTC cough medications is dextromethorphan. Often the letters 'DM' appear in the name. - OTC COUGH DROPS: Cough drops can help a lot, especially for mild coughs. They reduce coughing by soothing your irritated throat and removing that tickle sensation in the back of the throat. Cough drops also have the  advantage of  portability - you can carry them with you. OTC COUGH SYRUP - DEXTROMETHORPHAN: * Cough syrups containing the cough suppressant dextromethorphan (DM) may help decrease your cough. Cough syrups work best for coughs that keep you awake at night. They can also sometimes help in the late stages of a respiratory infection when the cough is dry and hacking. They can be used along with cough drops. * Examples: Benylin, Robitussin DM, Vicks 44 Cough Relief - HOME REMEDY - HONEY: This old home remedy has been shown to help decrease coughing at night. The adult dosage is 2 teaspoons (10 ml) at bedtime. Honey should not be given to infants under one year of age. HUMIDIFIER: If the air is dry, use a humidifier in the bedroom. (Reason: dry air makes coughs worse) PREVENT DEHYDRATION: * Drink adequate liquids. * This will help soothe an irritated or dry throat and loosen up the phlegm. EXPECTED COURSE: Viral bronchitis causes a cough for 1 to 3 weeks. Sometimes you may cough up lots of phlegm (mucus). The mucus can normally be white, gray, yellow or green. CALL BACK IF: * Cough lasts over 3 weeks * Continuous coughing persists over 2 hours after cough treatment * Difficulty breathing occurs * Fever over 103 F (39.4 C) * Fever lasts over 3 days * You become worse. CARE ADVICE given per Cough - Acute Productive (Adult) guideline. After Care Instructions Given Call Event Type User Date / Time Description

## 2014-09-09 ENCOUNTER — Encounter: Payer: Self-pay | Admitting: Family Medicine

## 2014-09-09 ENCOUNTER — Ambulatory Visit (INDEPENDENT_AMBULATORY_CARE_PROVIDER_SITE_OTHER): Payer: Medicare Other | Admitting: Family Medicine

## 2014-09-09 VITALS — BP 130/70 | HR 74 | Temp 98.1°F | Wt 201.0 lb

## 2014-09-09 DIAGNOSIS — J209 Acute bronchitis, unspecified: Secondary | ICD-10-CM | POA: Diagnosis not present

## 2014-09-09 MED ORDER — HYDROCODONE-HOMATROPINE 5-1.5 MG/5ML PO SYRP: 5.0000 mL | ORAL_SOLUTION | Freq: Four times a day (QID) | ORAL | Status: AC | PRN

## 2014-09-09 NOTE — Progress Notes (Signed)
Pre visit review using our clinic review tool, if applicable. No additional management support is needed unless otherwise documented below in the visit note. 

## 2014-09-09 NOTE — Progress Notes (Signed)
   Subjective:    Patient ID: Linda Blackburn, female    DOB: 1951-04-25, 64 y.o.   MRN: [id number]  HPI Acute visit. Patient seen with onset of cough on Christmas Day. She's had some intermittent headaches. Cough mostly nonproductive. Minimal nasal congestion. She denies any shortness of breath. She's taken Mucinex DM without relief. Cough is fairly severe at night. She has taken hydrocodone cough syrup in the past and requesting refill. She does have reported intolerance to codeine but this was nausea. She has not had similar symptoms with hydrocodone the past.  Past Medical History  Diagnosis Date  . Depression   . Pancreatitis   . Hay fever   . Urine incontinence   . UTI (urinary tract infection)     reoccuring   . DEPRESSION 04/01/2010  . PANCREATITIS, HX OF 04/01/2010   Past Surgical History  Procedure Laterality Date  . Appendectomy    . Cholecystectomy    . Tonsillectomy    . Abdominal hysterectomy      fibroids    reports that she has never smoked. She does not have any smokeless tobacco history on file. She reports that she drinks alcohol. She reports that she does not use illicit drugs. family history includes Alcohol abuse in her maternal uncle and mother; Cancer in her father and paternal grandfather; Mental illness in her maternal aunt; Stroke in her maternal aunt. Allergies  Allergen Reactions  . Codeine     GI upset  . Morphine And Related     "In a scarry movie" weird thoughts      Review of Systems  Constitutional: Positive for fatigue. Negative for fever and chills.  HENT: Positive for congestion.   Respiratory: Positive for cough.   Neurological: Positive for headaches.       Objective:   Physical Exam  Constitutional: She appears well-developed and well-nourished.  HENT:  Right Ear: External ear normal.  Left Ear: External ear normal.  Mouth/Throat: Oropharynx is clear and moist.  Neck: Neck supple.  Cardiovascular: Normal rate and regular rhythm.    Pulmonary/Chest: Effort normal and breath sounds normal. No respiratory distress. She has no wheezes. She has no rales.  Lymphadenopathy:    She has no cervical adenopathy.          Assessment & Plan:  Cough. Suspect acute viral bronchitis. Hycodan cough syrup 1 teaspoon daily at bedtime when necessary. Follow-up for fever or worsening symptoms

## 2014-09-09 NOTE — Patient Instructions (Signed)

## 2014-09-10 DIAGNOSIS — R490 Dysphonia: Secondary | ICD-10-CM | POA: Diagnosis not present

## 2014-09-14 ENCOUNTER — Other Ambulatory Visit (INDEPENDENT_AMBULATORY_CARE_PROVIDER_SITE_OTHER): Payer: Medicare Other

## 2014-09-14 ENCOUNTER — Encounter: Payer: Self-pay | Admitting: Gastroenterology

## 2014-09-14 ENCOUNTER — Ambulatory Visit (INDEPENDENT_AMBULATORY_CARE_PROVIDER_SITE_OTHER): Payer: Medicare Other | Admitting: Gastroenterology

## 2014-09-14 VITALS — BP 132/92 | HR 80 | Ht 61.25 in | Wt 202.0 lb

## 2014-09-14 DIAGNOSIS — K859 Acute pancreatitis, unspecified: Secondary | ICD-10-CM | POA: Diagnosis not present

## 2014-09-14 DIAGNOSIS — Z1211 Encounter for screening for malignant neoplasm of colon: Secondary | ICD-10-CM

## 2014-09-14 DIAGNOSIS — K858 Other acute pancreatitis without necrosis or infection: Secondary | ICD-10-CM

## 2014-09-14 LAB — BUN: BUN: 11 mg/dL (ref 6–23)

## 2014-09-14 LAB — CREATININE, SERUM: CREATININE: 0.7 mg/dL (ref 0.4–1.2)

## 2014-09-14 MED ORDER — MOVIPREP 100 G PO SOLR: 1.0000 | Freq: Once | ORAL | Status: DC

## 2014-09-14 NOTE — Progress Notes (Signed)
HPI: This is a   Very pleasant 64 year old woman whom I am meeting for the first time today. She is here with a very good friend of hers.  Moved from [place];  First episode 15-20 years ago, acute pancreatitis.  Has had in [place], [place].  December 2009 ERCP Dr. Reita Cliche in [place], ERCP wsa time of diagnosis of divisum.  Never proven with MRI.  6 total episodes. Was in ICU in [place] for.  Lap chole remotely 2001, for gallstones (had pancreatitis prior to that).  I reviewed several records which are available in the media tab in our Epic system. MRI, MRCP dated 2006 showed pancreatic divisum. She underwent an ERCP with a gastroenterologist in [place] in 2009. He performed actually a biliary sphincterotomy with sweeping of the bile duct, he described a small amount of debris was removed.  Etoh never really an issue.  Overall her weight  Has been rising.    Acute recurrent pancreatitis, ? Pancreatitic divisum: she was hospitalized for several days October 2015 for mild acute pancreatitis. This diagnosis was supported by a lipase greater than 3000 as well as non-IV contrast CT scan showing peripancreatic, pancreatic inflammation. Ultrasound showed no biliary dilation. Her liver tests were not elevated. Her gallbladder was removed remotely.  She has been told that she has pancreatic divisum and actually underwent what sounds like ERCP in [place] 6-7 years ago.    Review of systems: Pertinent positive and negative review of systems were noted in the above HPI section. Complete review of systems was performed and was otherwise normal.    Past Medical History  Diagnosis Date  . Depression   . Pancreatitis   . Hay fever   . Urine incontinence   . UTI (urinary tract infection)     reoccuring   . Anxiety   . Gallstones   . HTN (hypertension)     Past Surgical History  Procedure Laterality Date  . Appendectomy  1984  . Cholecystectomy  09/08/1999  . Tonsillectomy    . Abdominal  hysterectomy  1984    fibroids  . Ercp w/ sphincterotomy and balloon dilation  08/2008    Current Outpatient Prescriptions  Medication Sig Dispense Refill  . fluticasone (FLONASE) 50 MCG/ACT nasal spray Place 1 spray into both nostrils as needed for allergies or rhinitis.    Marland Kitchen HYDROcodone-homatropine (HYCODAN) 5-1.5 MG/5ML syrup Take 5 mLs by mouth every 6 (six) hours as needed. 120 mL 0  . losartan (COZAAR) 100 MG tablet Take 1 tablet (100 mg total) by mouth daily. 90 tablet 3  . nystatin cream (MYCOSTATIN) Apply 1 application topically 2 (two) times daily. 30 g 1  . ondansetron (ZOFRAN ODT) 4 MG disintegrating tablet Take 1 tablet (4 mg total) by mouth every 8 (eight) hours as needed for nausea or vomiting. 20 tablet 1  . pantoprazole (PROTONIX) 40 MG tablet Take 1 tablet (40 mg total) by mouth daily. 30 tablet 3  . sertraline (ZOLOFT) 100 MG tablet Take 1 tablet (100 mg total) by mouth daily. 90 tablet 3  . zolpidem (AMBIEN) 5 MG tablet Take 1 tablet (5 mg total) by mouth at bedtime as needed for sleep. 30 tablet 1   No current facility-administered medications for this visit.    Allergies as of 09/14/2014 - Review Complete 09/14/2014  Allergen Reaction Noted  . Codeine  12/06/2010  . Morphine and related  12/06/2010    Family History  Problem Relation Age of Onset  . Alcohol abuse  Mother   . Prostate cancer Father   . Stroke Maternal Aunt   . Prostate cancer Paternal Grandfather   . Hiatal hernia Maternal Aunt     History   Social History  . Marital Status: Widowed    Spouse Name: N/A    Number of Children: 2  . Years of Education: N/A   Occupational History  . retired    Social History Main Topics  . Smoking status: Never Smoker   . Smokeless tobacco: Never Used  . Alcohol Use: 0.0 oz/week    0 Not specified per week     Comment: occ  . Drug Use: No  . Sexual Activity: No   Other Topics Concern  . Not on file   Social History Narrative        Physical Exam: Ht 5' 1.25" (1.556 m)  Wt 202 lb (91.627 kg)  BMI 37.84 kg/m2 Constitutional: generally well-appearing Psychiatric: alert and oriented x3 Eyes: extraocular movements intact Mouth: oral pharynx moist, no lesions Neck: supple no lymphadenopathy Cardiovascular: heart regular rate and rhythm Lungs: clear to auscultation bilaterally Abdomen: soft, nontender, nondistended, no obvious ascites, no peritoneal signs, normal bowel sounds Extremities: no lower extremity edema bilaterally Skin: no lesions on visible extremities    Assessment and plan: 64 y.o. female with  Pancreatic divisum, recurrent intermittent acute pancreatitis  7-10% of the population has pancreatic divisum. The vast vast majority of these never have pancreatic problems, pancreatitis. She had gallstones and laparoscopic cholecystectomy many years ago with the hope that she would have no further pancreatitis. After that she was found to have pancreatic divisum. Biliary sphincterotomy performed 2009. She had very clear, well documented acute pancreatitis 3 months ago. This will be her sixth episode of pancreatitis. Perhaps she has pancreatitis from her anatomic variant. Perhaps she has other cause. She does not believe she has ever been tested for autoimmune pancreatitis and we will arrange for laboratory testing from now. I would also like to perform MRI for her to confirm pancreatic divisum again and also to check for recurrent biliary pathology. Perhaps she has a small uncinate branch associated with her bile duct, if she did have recurrent bile duct stones, this could theoretically cause pancreatitis by way of uncinate branch. She has never had minor papilla evaluation. We discussed referral to tertiary center for this if needed. We also discuss colon cancer screening in she has never had this. We'll proceed with colonoscopy in one or 2 months.

## 2014-09-14 NOTE — Patient Instructions (Addendum)
You have been scheduled for a colonoscopy. Please follow written instructions given to you at your visit today.  Please pick up your prep kit at the pharmacy within the next 1-3 days. If you use inhalers (even only as needed), please bring them with you on the day of your procedure. Your physician has requested that you go to www.startemmi.com and enter the access code given to you at your visit today. This web site gives a general overview about your procedure. However, you should still follow specific instructions given to you by our office regarding your preparation for the procedure.  You will have labs checked today in the basement lab.  Please head down after you check out with the front desk  (IgG 4 subtype, BUN, Creatinine).  You have been scheduled for an MRI/MRCP at [facility] (1st floor) on_______________. Your appointment time is ______________. Please arrive 15 minutes prior to your appointment time for registration purposes. Please make certain not to have anything to eat or drink 6 hours prior to your test. In addition, if you have any metal in your body, have a pacemaker or defibrillator, please be sure to let your ordering physician know. This test typically takes 45 minutes to 1 hour to complete.

## 2014-09-16 LAB — IGG 1, 2, 3, AND 4
IGG SUBCLASS 2: 510 mg/dL (ref 241–700)
IGG SUBCLASS 4: 54.8 mg/dL (ref 4.0–86.0)
IgG (Immunoglobin G), Serum: 1100 mg/dL (ref 690–1700)
IgG Subclass 1: 522 mg/dL (ref 382–929)
IgG Subclass 3: 68 mg/dL (ref 22–178)

## 2014-09-28 ENCOUNTER — Ambulatory Visit (HOSPITAL_COMMUNITY): Payer: Medicare Other

## 2014-09-30 ENCOUNTER — Ambulatory Visit (HOSPITAL_COMMUNITY): Payer: Medicare Other

## 2014-10-06 ENCOUNTER — Ambulatory Visit (HOSPITAL_COMMUNITY)
Admission: RE | Admit: 2014-10-06 | Discharge: 2014-10-06 | Disposition: A | Discharge status: Home / Self Care | Payer: Medicare Other | Source: Ambulatory Visit | Attending: Gastroenterology | Admitting: Gastroenterology

## 2014-10-06 ENCOUNTER — Other Ambulatory Visit: Payer: Self-pay | Admitting: Gastroenterology

## 2014-10-06 DIAGNOSIS — K858 Other acute pancreatitis without necrosis or infection: Secondary | ICD-10-CM

## 2014-10-06 DIAGNOSIS — Z9889 Other specified postprocedural states: Secondary | ICD-10-CM | POA: Diagnosis not present

## 2014-10-06 DIAGNOSIS — K861 Other chronic pancreatitis: Secondary | ICD-10-CM | POA: Insufficient documentation

## 2014-10-06 MED ORDER — GADOBENATE DIMEGLUMINE 529 MG/ML IV SOLN
20.0000 mL | Freq: Once | INTRAVENOUS | Status: AC | PRN
  Administered 2014-10-06: 19 mL via INTRAVENOUS

## 2014-10-08 ENCOUNTER — Other Ambulatory Visit: Payer: Self-pay

## 2014-10-13 ENCOUNTER — Telehealth: Payer: Self-pay | Admitting: Gastroenterology

## 2014-10-14 NOTE — Telephone Encounter (Signed)
Pt has pancreatic divisum, please arrange referral to Dr. Gayleen Orem at [facility]

## 2014-10-14 NOTE — Telephone Encounter (Signed)
Referral has been made records faxed to [facility] Left message on machine to call back

## 2014-10-15 ENCOUNTER — Telehealth: Payer: Self-pay | Admitting: Family Medicine

## 2014-10-15 NOTE — Telephone Encounter (Signed)
Pt returned call and has already spoken to [facility] she will call back with any further questions

## 2014-10-15 NOTE — Telephone Encounter (Signed)
Last visit 09/09/14

## 2014-10-15 NOTE — Telephone Encounter (Signed)
Refill once 

## 2014-10-15 NOTE — Telephone Encounter (Signed)
Pt would like another rx hydromet cough med. Pt still has cough

## 2014-10-15 NOTE — Telephone Encounter (Signed)
Left message on machine to call back  

## 2014-10-16 MED ORDER — HYDROCODONE-HOMATROPINE 5-1.5 MG/5ML PO SYRP: 5.0000 mL | ORAL_SOLUTION | Freq: Four times a day (QID) | ORAL | Status: DC | PRN

## 2014-10-16 NOTE — Telephone Encounter (Signed)
Left message on Vm that Rx is ready for pickup

## 2014-11-03 ENCOUNTER — Ambulatory Visit (AMBULATORY_SURGERY_CENTER): Payer: Medicare Other | Admitting: Gastroenterology

## 2014-11-03 ENCOUNTER — Encounter: Payer: Self-pay | Admitting: Gastroenterology

## 2014-11-03 VITALS — BP 115/45 | HR 63 | Temp 98.5°F | Resp 16 | Ht 61.25 in | Wt 202.0 lb

## 2014-11-03 DIAGNOSIS — D122 Benign neoplasm of ascending colon: Secondary | ICD-10-CM | POA: Diagnosis not present

## 2014-11-03 DIAGNOSIS — G4700 Insomnia, unspecified: Secondary | ICD-10-CM | POA: Diagnosis not present

## 2014-11-03 DIAGNOSIS — K858 Other acute pancreatitis without necrosis or infection: Secondary | ICD-10-CM

## 2014-11-03 DIAGNOSIS — F329 Major depressive disorder, single episode, unspecified: Secondary | ICD-10-CM | POA: Diagnosis not present

## 2014-11-03 DIAGNOSIS — Z1211 Encounter for screening for malignant neoplasm of colon: Secondary | ICD-10-CM

## 2014-11-03 DIAGNOSIS — K5730 Diverticulosis of large intestine without perforation or abscess without bleeding: Secondary | ICD-10-CM

## 2014-11-03 DIAGNOSIS — I10 Essential (primary) hypertension: Secondary | ICD-10-CM | POA: Diagnosis not present

## 2014-11-03 MED ORDER — SODIUM CHLORIDE 0.9 % IV SOLN: 500.0000 mL | INTRAVENOUS | Status: DC

## 2014-11-03 NOTE — Progress Notes (Signed)
Report to PACU, RN, vss, BBS= Clear.  

## 2014-11-03 NOTE — Op Note (Signed)
[place]  [address]. [place], [address]   COLONOSCOPY PROCEDURE REPORT  PATIENT: Linda Blackburn, Linda Blackburn  MR#: [id number] BIRTHDATE: 1950-09-13 , 49  yrs. old GENDER: female ENDOSCOPIST: Milus Banister, MD REFERRED BY: Carolann Littler, MD PROCEDURE DATE:  11/03/2014 PROCEDURE:   Colonoscopy with biopsy First Screening Colonoscopy - Avg.  risk and is 50 yrs.  old or older Yes.  Prior Negative Screening - Now for repeat screening. N/A  History of Adenoma - Now for follow-up colonoscopy & has been > or = to 3 yrs.  N/A  Polyps Removed Today? Yes. ASA CLASS:   Class II INDICATIONS:average risk patient for colon cancer. MEDICATIONS: Monitored anesthesia care and Propofol 230 mg IV  DESCRIPTION OF PROCEDURE:   After the risks benefits and alternatives of the procedure were thoroughly explained, informed consent was obtained.  The digital rectal exam revealed no abnormalities of the rectum.   The LB [id number]-[id number]  endoscope was introduced through the anus and advanced to the cecum, which was identified by both the appendix and ileocecal valve. No adverse events experienced.   The quality of the prep was excellent.  The instrument was then slowly withdrawn as the colon was fully examined.   COLON FINDINGS: One polyp was found, removed and sent to pathology. This was sessile, 71mm across, located in ascending segment, removed with biopsy.  There were multiple small diverticulum in the left colon.  The examination was othewise normal.  Retroflexed views revealed no abnormalities. The time to cecum=3 minutes 51 seconds. Withdrawal time=7 minutes 18 seconds.  The scope was withdrawn and the procedure completed. COMPLICATIONS: There were no immediate complications.  ENDOSCOPIC IMPRESSION: One polyp was found, removed and sent to pathology. There were multiple small diverticulum in the left colon. The examination was othewise normal  RECOMMENDATIONS: If the polyp(s)  removed today are proven to be adenomatous (pre-cancerous) polyps, you will need a repeat colonoscopy in 5 years.  Otherwise you should continue to follow colorectal cancer screening guidelines for "routine risk" patients with colonoscopy in 10 years.  You will receive a letter within 1-2 weeks with the results of your biopsy as well as final recommendations.  Please call my office if you have not received a letter after 3 weeks.  eSigned:  Milus Banister, MD 11/03/2014 11:11 AM

## 2014-11-03 NOTE — Progress Notes (Signed)
Called to room to assist during endoscopic procedure.  Patient ID and intended procedure confirmed with present staff. Received instructions for my participation in the procedure from the performing physician.  

## 2014-11-03 NOTE — Patient Instructions (Signed)
YOU HAD AN ENDOSCOPIC PROCEDURE TODAY AT [facility]:   Refer to the procedure report that was given to you for any specific questions about what was found during the examination.  If the procedure report does not answer your questions, please call your gastroenterologist to clarify.  If you requested that your care partner not be given the details of your procedure findings, then the procedure report has been included in a sealed envelope for you to review at your convenience later.  YOU SHOULD EXPECT: Some feelings of bloating in the abdomen. Passage of more gas than usual.  Walking can help get rid of the air that was put into your GI tract during the procedure and reduce the bloating. If you had a lower endoscopy (such as a colonoscopy or flexible sigmoidoscopy) you may notice spotting of blood in your stool or on the toilet paper. If you underwent a bowel prep for your procedure, you may not have a normal bowel movement for a few days.  Please Note:  You might notice some irritation and congestion in your nose or some drainage.  This is from the oxygen used during your procedure.  There is no need for concern and it should clear up in a day or so.  SYMPTOMS TO REPORT IMMEDIATELY:   Following lower endoscopy (colonoscopy or flexible sigmoidoscopy):  Excessive amounts of blood in the stool  Significant tenderness or worsening of abdominal pains  Swelling of the abdomen that is new, acute  Fever of 100F or higher     A gastroenterologist can be reached at any hour by calling [phone].   DIET: Your first meal following the procedure should be a small meal and then it is ok to progress to your normal diet. Heavy or fried foods are harder to digest and may make you feel nauseous or bloated.  Likewise, meals heavy in dairy and vegetables can increase bloating.  Drink plenty of fluids but you should avoid alcoholic beverages for 24 hours.  ACTIVITY:  You should plan to take  it easy for the rest of today and you should NOT DRIVE or use heavy machinery until tomorrow (because of the sedation medicines used during the test).    FOLLOW UP: Our staff will call the number listed on your records the next business day following your procedure to check on you and address any questions or concerns that you may have regarding the information given to you following your procedure. If we do not reach you, we will leave a message.  However, if you are feeling well and you are not experiencing any problems, there is no need to return our call.  We will assume that you have returned to your regular daily activities without incident.  If any biopsies were taken you will be contacted by phone or by letter within the next 1-3 weeks.  Please call us at [phone] if you have not heard about the biopsies in 3 weeks.    SIGNATURES/CONFIDENTIALITY: You and/or your care partner have signed paperwork which will be entered into your electronic medical record.  These signatures attest to the fact that that the information above on your After Visit Summary has been reviewed and is understood.  Full responsibility of the confidentiality of this discharge information lies with you and/or your care-partner.

## 2014-11-04 ENCOUNTER — Telehealth: Payer: Self-pay

## 2014-11-04 NOTE — Telephone Encounter (Signed)
Left a message at [phone] for the pt to call us back if any questions or concerns. maw

## 2014-11-10 ENCOUNTER — Encounter: Payer: Self-pay | Admitting: Gastroenterology

## 2014-11-25 DIAGNOSIS — I10 Essential (primary) hypertension: Secondary | ICD-10-CM | POA: Diagnosis not present

## 2014-11-25 DIAGNOSIS — K219 Gastro-esophageal reflux disease without esophagitis: Secondary | ICD-10-CM | POA: Diagnosis not present

## 2014-11-25 DIAGNOSIS — K859 Acute pancreatitis, unspecified: Secondary | ICD-10-CM | POA: Diagnosis not present

## 2014-11-25 DIAGNOSIS — Q453 Other congenital malformations of pancreas and pancreatic duct: Secondary | ICD-10-CM | POA: Diagnosis not present

## 2014-12-16 ENCOUNTER — Ambulatory Visit (INDEPENDENT_AMBULATORY_CARE_PROVIDER_SITE_OTHER): Payer: Medicare Other | Admitting: Family Medicine

## 2014-12-16 VITALS — BP 124/86 | Temp 98.1°F | Ht 61.25 in | Wt 204.0 lb

## 2014-12-16 DIAGNOSIS — M5416 Radiculopathy, lumbar region: Secondary | ICD-10-CM

## 2014-12-16 DIAGNOSIS — Q828 Other specified congenital malformations of skin: Secondary | ICD-10-CM

## 2014-12-16 MED ORDER — PREDNISONE 10 MG PO TABS: ORAL_TABLET | ORAL | Status: DC

## 2014-12-16 NOTE — Progress Notes (Signed)
   Subjective:    Patient ID: Linda Blackburn, female    DOB: 01/06/1951, 64 y.o.   MRN: [id number]  HPI  Patient seen for the following issues:  Left lumbar back pain with sciatica type symptoms for about one month. No injury. Past history of somewhat similar symptoms right side. She describes achy to sharp pain which is worse with sitting and actually is relieved with standing.  Took some ibuprofen with minimal relief but had some associated nausea. Denies lower extremely weakness or numbness. Pain radiates in her left buttock down to the knee. No urine or stool incontinence. No fevers or chills. No recent appetite or weight changes  Multiple skin tags around her neck. She also has one on her right waist and these are irritating because of location and rubbing against clothing. She's had these treated before with liquid nitrogen and requesting the same.  Past Medical History  Diagnosis Date  . Depression   . Pancreatitis   . Hay fever   . Urine incontinence   . UTI (urinary tract infection)     reoccuring   . Anxiety   . Gallstones   . HTN (hypertension)    Past Surgical History  Procedure Laterality Date  . Appendectomy  1984  . Cholecystectomy  09/08/1999  . Tonsillectomy    . Abdominal hysterectomy  1984    fibroids  . Ercp w/ sphincterotomy and balloon dilation  08/2008    reports that she has never smoked. She has never used smokeless tobacco. She reports that she drinks alcohol. She reports that she does not use illicit drugs. family history includes Alcohol abuse in her mother; Hiatal hernia in her maternal aunt; Prostate cancer in her father and paternal grandfather; Stroke in her maternal aunt. Allergies  Allergen Reactions  . Codeine     GI upset  . Morphine And Related     "In a scarry movie" weird thoughts     Review of Systems  Constitutional: Negative for fever, chills, appetite change and unexpected weight change.  Respiratory: Negative for shortness of  breath.   Cardiovascular: Negative for chest pain.  Gastrointestinal: Negative for abdominal pain.  Genitourinary: Negative for dysuria.  Musculoskeletal: Positive for back pain.  Neurological: Negative for weakness and numbness.       Objective:   Physical Exam  Constitutional: She appears well-developed and well-nourished. No distress.  Cardiovascular: Normal rate and regular rhythm.   Pulmonary/Chest: Effort normal and breath sounds normal. No respiratory distress. She has no wheezes. She has no rales.  Musculoskeletal:  Straight leg raise is negative on the left. Back no reproducible tenderness  Neurological:  Full-strength lower extremities. Normal sensory function. Symmetric reflexes ankle and knee  Skin:  She has multiple benign-appearing skin tags around her neck and also slightly larger 1 right waist          Assessment & Plan:  Left lumbar radiculitis. Nonfocal exam neurologically. Prednisone taper. Reviewed side effects. Simple stretches given. Touch base 2 weeks if no improvement  Multiple skin tags. Discussed risk and benefits of treatment with liquid nitrogen and patient consented. We treated a total of 6 skin tags around her neck and one right waist. She tolerated well

## 2014-12-16 NOTE — Progress Notes (Signed)
Pre visit review using our clinic review tool, if applicable. No additional management support is needed unless otherwise documented below in the visit note. 

## 2014-12-16 NOTE — Patient Instructions (Signed)

## 2014-12-29 ENCOUNTER — Other Ambulatory Visit: Payer: Self-pay | Admitting: Family Medicine

## 2015-01-04 ENCOUNTER — Encounter: Payer: Self-pay | Admitting: Family Medicine

## 2015-01-04 ENCOUNTER — Ambulatory Visit (INDEPENDENT_AMBULATORY_CARE_PROVIDER_SITE_OTHER): Payer: Medicare Other | Admitting: Family Medicine

## 2015-01-04 VITALS — BP 126/78 | HR 76 | Temp 98.1°F | Wt 207.0 lb

## 2015-01-04 DIAGNOSIS — R300 Dysuria: Secondary | ICD-10-CM

## 2015-01-04 DIAGNOSIS — R21 Rash and other nonspecific skin eruption: Secondary | ICD-10-CM

## 2015-01-04 LAB — POCT URINALYSIS DIPSTICK
Bilirubin, UA: NEGATIVE
GLUCOSE UA: NEGATIVE
KETONES UA: NEGATIVE
Nitrite, UA: NEGATIVE
Protein, UA: NEGATIVE
Spec Grav, UA: 1.025
UROBILINOGEN UA: 0.2
pH, UA: 5

## 2015-01-04 MED ORDER — CEPHALEXIN 500 MG PO CAPS: 500.0000 mg | ORAL_CAPSULE | Freq: Three times a day (TID) | ORAL | Status: DC

## 2015-01-04 MED ORDER — TRIAMCINOLONE ACETONIDE 0.1 % EX CREA: 1.0000 "application " | TOPICAL_CREAM | Freq: Two times a day (BID) | CUTANEOUS | Status: DC

## 2015-01-04 NOTE — Progress Notes (Signed)
   Subjective:    Patient ID: Linda Blackburn, female    DOB: Jul 15, 1951, 64 y.o.   MRN: [id number]  HPI  Patient seen with few day history of dysuria. She's had some mild suprapubic pressure and occasional burning with urination. No fevers or chills. No nausea or vomiting. No flank pain.  Separate problem of pruritic rash left cheek region. Appeared few days ago. No pain. No burning. No exacerbating or alleviating factors. She has not tried any topical creams. No other skin rash reported.  Past Medical History  Diagnosis Date  . Depression   . Pancreatitis   . Hay fever   . Urine incontinence   . UTI (urinary tract infection)     reoccuring   . Anxiety   . Gallstones   . HTN (hypertension)    Past Surgical History  Procedure Laterality Date  . Appendectomy  1984  . Cholecystectomy  09/08/1999  . Tonsillectomy    . Abdominal hysterectomy  1984    fibroids  . Ercp w/ sphincterotomy and balloon dilation  08/2008    reports that she has never smoked. She has never used smokeless tobacco. She reports that she drinks alcohol. She reports that she does not use illicit drugs. family history includes Alcohol abuse in her mother; Hiatal hernia in her maternal aunt; Prostate cancer in her father and paternal grandfather; Stroke in her maternal aunt. Allergies  Allergen Reactions  . Codeine     GI upset  . Morphine And Related     "In a scarry movie" weird thoughts      Review of Systems  Constitutional: Negative for fever, chills and appetite change.  Gastrointestinal: Negative for nausea, vomiting, abdominal pain, diarrhea and constipation.  Genitourinary: Positive for dysuria and frequency.  Musculoskeletal: Negative for back pain.  Skin: Positive for rash.  Neurological: Negative for dizziness.       Objective:   Physical Exam  Constitutional: She appears well-developed and well-nourished.  Cardiovascular: Normal rate and regular rhythm.   Pulmonary/Chest: Effort normal and  breath sounds normal. No respiratory distress. She has no wheezes. She has no rales.  Skin: Rash noted.  Left cheek reveals slightly erythematous slightly raised nontender nonscaly nonpustular rash          Assessment & Plan:  #1 dysuria. Probable UTI. Urine culture sent. Keflex 500 mg 3 times a day for 5 days pending culture #2 skin rash left face. Suspect contact dermatitis. Very limited involvement. Topical triamcinolone 0.1% cream twice daily and follow-up when necessary.

## 2015-01-04 NOTE — Progress Notes (Signed)
Pre visit review using our clinic review tool, if applicable. No additional management support is needed unless otherwise documented below in the visit note. 

## 2015-01-04 NOTE — Patient Instructions (Signed)

## 2015-01-06 ENCOUNTER — Telehealth: Payer: Self-pay

## 2015-01-06 NOTE — Telephone Encounter (Signed)
appt information received regarding appt with Dr Lysle Rubens at [facility].  Pt was notified and appt is set for 03/25/15.

## 2015-01-08 LAB — URINE CULTURE: Colony Count: >=100000

## 2015-03-25 DIAGNOSIS — K219 Gastro-esophageal reflux disease without esophagitis: Secondary | ICD-10-CM | POA: Diagnosis not present

## 2015-03-25 DIAGNOSIS — K5790 Diverticulosis of intestine, part unspecified, without perforation or abscess without bleeding: Secondary | ICD-10-CM | POA: Diagnosis not present

## 2015-03-25 DIAGNOSIS — Z885 Allergy status to narcotic agent status: Secondary | ICD-10-CM | POA: Diagnosis not present

## 2015-03-25 DIAGNOSIS — I10 Essential (primary) hypertension: Secondary | ICD-10-CM | POA: Diagnosis not present

## 2015-03-25 DIAGNOSIS — K859 Acute pancreatitis, unspecified: Secondary | ICD-10-CM | POA: Diagnosis not present

## 2015-03-25 DIAGNOSIS — Z79899 Other long term (current) drug therapy: Secondary | ICD-10-CM | POA: Diagnosis not present

## 2015-03-25 DIAGNOSIS — Z9049 Acquired absence of other specified parts of digestive tract: Secondary | ICD-10-CM | POA: Diagnosis not present

## 2015-03-25 DIAGNOSIS — K869 Disease of pancreas, unspecified: Secondary | ICD-10-CM | POA: Diagnosis not present

## 2015-03-30 ENCOUNTER — Other Ambulatory Visit: Payer: Self-pay

## 2015-03-30 DIAGNOSIS — Z1231 Encounter for screening mammogram for malignant neoplasm of breast: Secondary | ICD-10-CM

## 2015-04-02 ENCOUNTER — Encounter: Payer: Self-pay | Admitting: Family Medicine

## 2015-05-05 ENCOUNTER — Ambulatory Visit
Admission: RE | Admit: 2015-05-05 | Discharge: 2015-05-05 | Disposition: A | Discharge status: Home / Self Care | Payer: Medicare Other | Source: Ambulatory Visit

## 2015-05-05 DIAGNOSIS — Z1231 Encounter for screening mammogram for malignant neoplasm of breast: Secondary | ICD-10-CM | POA: Diagnosis not present

## 2015-07-15 ENCOUNTER — Other Ambulatory Visit (INDEPENDENT_AMBULATORY_CARE_PROVIDER_SITE_OTHER): Payer: Medicare Other

## 2015-07-15 DIAGNOSIS — Z0000 Encounter for general adult medical examination without abnormal findings: Secondary | ICD-10-CM

## 2015-07-15 DIAGNOSIS — E785 Hyperlipidemia, unspecified: Secondary | ICD-10-CM | POA: Diagnosis not present

## 2015-07-15 DIAGNOSIS — E039 Hypothyroidism, unspecified: Secondary | ICD-10-CM | POA: Diagnosis not present

## 2015-07-15 DIAGNOSIS — D649 Anemia, unspecified: Secondary | ICD-10-CM

## 2015-07-15 DIAGNOSIS — I519 Heart disease, unspecified: Secondary | ICD-10-CM

## 2015-07-15 LAB — CBC WITH DIFFERENTIAL/PLATELET
BASOS ABS: 0.1 10*3/uL (ref 0.0–0.1)
Basophils Relative: 0.9 % (ref 0.0–3.0)
EOS ABS: 0.2 10*3/uL (ref 0.0–0.7)
Eosinophils Relative: 2.4 % (ref 0.0–5.0)
HCT: 46.6 % — ABNORMAL HIGH (ref 36.0–46.0)
Hemoglobin: 15.3 g/dL — ABNORMAL HIGH (ref 12.0–15.0)
LYMPHS ABS: 3 10*3/uL (ref 0.7–4.0)
Lymphocytes Relative: 37.8 % (ref 12.0–46.0)
MCHC: 32.8 g/dL (ref 30.0–36.0)
MCV: 90.2 fl (ref 78.0–100.0)
MONO ABS: 0.6 10*3/uL (ref 0.1–1.0)
MONOS PCT: 7.1 % (ref 3.0–12.0)
NEUTROS ABS: 4.1 10*3/uL (ref 1.4–7.7)
NEUTROS PCT: 51.8 % (ref 43.0–77.0)
PLATELETS: 270 10*3/uL (ref 150.0–400.0)
RBC: 5.17 Mil/uL — AB (ref 3.87–5.11)
RDW: 14 % (ref 11.5–15.5)
WBC: 7.8 10*3/uL (ref 4.0–10.5)

## 2015-07-15 LAB — BASIC METABOLIC PANEL
BUN: 18 mg/dL (ref 6–23)
CALCIUM: 9.7 mg/dL (ref 8.4–10.5)
CO2: 25 meq/L (ref 19–32)
CREATININE: 0.81 mg/dL (ref 0.40–1.20)
Chloride: 106 mEq/L (ref 96–112)
GFR: 75.07 mL/min (ref >60.00)
GLUCOSE: 101 mg/dL — AB (ref 70–99)
Potassium: 4.1 mEq/L (ref 3.5–5.1)
Sodium: 141 mEq/L (ref 135–145)

## 2015-07-15 LAB — HEPATIC FUNCTION PANEL
ALBUMIN: 4.6 g/dL (ref 3.5–5.2)
ALK PHOS: 96 U/L (ref 39–117)
ALT: 33 U/L (ref 0–35)
AST: 25 U/L (ref 0–37)
BILIRUBIN DIRECT: 0.1 mg/dL (ref 0.0–0.3)
TOTAL PROTEIN: 7.4 g/dL (ref 6.0–8.3)
Total Bilirubin: 0.7 mg/dL (ref 0.2–1.2)

## 2015-07-15 LAB — LIPID PANEL
CHOLESTEROL: 194 mg/dL (ref 0–200)
HDL: 42.1 mg/dL (ref >39.00)
LDL Cholesterol: 123 mg/dL — ABNORMAL HIGH (ref 0–99)
NONHDL: 152.15
Total CHOL/HDL Ratio: 5
Triglycerides: 144 mg/dL (ref 0.0–149.0)
VLDL: 28.8 mg/dL (ref 0.0–40.0)

## 2015-07-15 LAB — TSH: TSH: 1.88 u[IU]/mL (ref 0.35–4.50)

## 2015-07-22 ENCOUNTER — Encounter: Payer: Self-pay | Admitting: Family Medicine

## 2015-07-22 ENCOUNTER — Ambulatory Visit (INDEPENDENT_AMBULATORY_CARE_PROVIDER_SITE_OTHER): Payer: Medicare Other | Admitting: Family Medicine

## 2015-07-22 VITALS — BP 100/80 | HR 103 | Temp 98.0°F | Resp 16 | Ht 61.25 in | Wt 220.1 lb

## 2015-07-22 DIAGNOSIS — Z0000 Encounter for general adult medical examination without abnormal findings: Secondary | ICD-10-CM | POA: Diagnosis not present

## 2015-07-22 DIAGNOSIS — K8500 Idiopathic acute pancreatitis without necrosis or infection: Secondary | ICD-10-CM

## 2015-07-22 DIAGNOSIS — R300 Dysuria: Secondary | ICD-10-CM

## 2015-07-22 DIAGNOSIS — Z23 Encounter for immunization: Secondary | ICD-10-CM

## 2015-07-22 DIAGNOSIS — Z780 Asymptomatic menopausal state: Secondary | ICD-10-CM

## 2015-07-22 DIAGNOSIS — F4323 Adjustment disorder with mixed anxiety and depressed mood: Secondary | ICD-10-CM | POA: Diagnosis not present

## 2015-07-22 DIAGNOSIS — I10 Essential (primary) hypertension: Secondary | ICD-10-CM

## 2015-07-22 LAB — POCT URINALYSIS DIPSTICK
Bilirubin, UA: NEGATIVE
Blood, UA: NEGATIVE
Glucose, UA: NEGATIVE
KETONES UA: NEGATIVE
Leukocytes, UA: NEGATIVE
Nitrite, UA: NEGATIVE
PH UA: 5
PROTEIN UA: NEGATIVE
Spec Grav, UA: 1.025
Urobilinogen, UA: 0.2

## 2015-07-22 MED ORDER — FLUTICASONE PROPIONATE 50 MCG/ACT NA SUSP: 1.0000 | NASAL | Status: DC | PRN

## 2015-07-22 MED ORDER — ONDANSETRON 4 MG PO TBDP: 4.0000 mg | ORAL_TABLET | Freq: Three times a day (TID) | ORAL | Status: DC | PRN

## 2015-07-22 MED ORDER — FLUCONAZOLE 100 MG PO TABS: 100.0000 mg | ORAL_TABLET | Freq: Every day | ORAL | Status: DC

## 2015-07-22 MED ORDER — LOSARTAN POTASSIUM 100 MG PO TABS: 100.0000 mg | ORAL_TABLET | Freq: Every day | ORAL | Status: DC

## 2015-07-22 MED ORDER — SERTRALINE HCL 100 MG PO TABS: 100.0000 mg | ORAL_TABLET | Freq: Every day | ORAL | Status: DC

## 2015-07-22 MED ORDER — ZOLPIDEM TARTRATE 5 MG PO TABS
5.0000 mg | ORAL_TABLET | Freq: Every evening | ORAL | Status: DC | PRN
Start: 2015-07-22 — End: 2016-08-07

## 2015-07-22 NOTE — Patient Instructions (Signed)
Health Maintenance  Topic Date Due  . Hepatitis C Screening  Jan 03, 1951  . DEXA SCAN  12/31/2013  . INFLUENZA VACCINE  04/05/2015  . PNA vac Low Risk Adult (2 of 2 - PPSV23) 07/21/2015  . MAMMOGRAM  05/04/2017  . COLONOSCOPY  11/03/2019  . TETANUS/TDAP  04/01/2020  . ZOSTAVAX  Completed

## 2015-07-22 NOTE — Progress Notes (Signed)
Subjective:    Patient ID: Linda Blackburn, female    DOB: [birth year], 64 y.o.   MRN: [id number]  HPI Patient seen for Medicare wellness exam and medical follow-up. Chronic problems include history of obesity, chronic insomnia, recurrent pancreatitis (?idiopathic),, recurrent depression, hypertension. It was thought for quite some time that she had pancreas devisum-but she had ERCP procedure at [facility] that did not confirm this. She's not had any recent flareups.  She needs Pneumovax and flu vaccine. She's not had recent bone density scan. She has gotten regular mammograms. Previous hysterectomy- so no indication for Pap smears.  Requesting refill several medications. She takes Flonase for allergies. She is on losartan for hypertension and that has been stable. No recent headaches or dizziness. No recent chest pains. She has recurrent depression currently stable on sertraline 100 mg once daily. Needs refills. Uses low-dose Ambien 5 mg at night as needed for insomnia. She uses Zofran 4 mg periodically for severe nausea.  She complained of some mild dysuria past few days. No fevers or chills. No flank pain. No hematuria  Past Medical History  Diagnosis Date  . Depression   . Pancreatitis   . Hay fever   . Urine incontinence   . UTI (urinary tract infection)     reoccuring   . Anxiety   . Gallstones   . HTN (hypertension)    Past Surgical History  Procedure Laterality Date  . Appendectomy  1984  . Cholecystectomy  09/08/1999  . Tonsillectomy    . Abdominal hysterectomy  1984    fibroids  . Ercp w/ sphincterotomy and balloon dilation  08/2008    reports that she has never smoked. She has never used smokeless tobacco. She reports that she drinks alcohol. She reports that she does not use illicit drugs. family history includes Alcohol abuse in her mother; Hiatal hernia in her maternal aunt; Prostate cancer in her father and paternal grandfather; Stroke in her maternal aunt. Allergies    Allergen Reactions  . Codeine     GI upset  . Morphine And Related     "In a scarry movie" weird thoughts   1.  Risk factors based on Past Medical , Social, and Family history reviewed and as indicated above with no changes 2.  Limitations in physical activities None.  No recent falls. 3.  Depression/mood No active depression or anxiety issues 4.  Hearing No defiits 5.  ADLs independent in all. 6.  Cognitive function (orientation to time and place, language, writing, speech,memory) no short or long term memory issues.  Language and judgement intact. 7.  Home Safety no issues 8.  Height, weight, and visual acuity. She's had some gradual weight gain in recent years otherwise stable 9.  Counseling discussed discussed importance of weight loss and strategies. 10. Recommendation of preventive services. Set up DEXA scan. Pneumovax and flu vaccines given. Continue yearly mammogram. 11. Labs based on risk factors recent labs reviewed with patient 12. Care Plan as above 13. Other Providers none 14. Written schedule of screening/prevention services given to patient.    Review of Systems  Constitutional: Positive for fatigue. Negative for fever, activity change, appetite change and unexpected weight change.  HENT: Negative for ear pain, hearing loss, sore throat and trouble swallowing.   Eyes: Negative for visual disturbance.  Respiratory: Negative for cough and shortness of breath.   Cardiovascular: Negative for chest pain and palpitations.  Gastrointestinal: Negative for vomiting, abdominal pain, diarrhea, constipation and blood in stool.  Genitourinary: Positive for dysuria. Negative for urgency and hematuria.  Musculoskeletal: Positive for arthralgias. Negative for myalgias and back pain.  Skin: Negative for rash.  Neurological: Negative for dizziness, syncope and headaches.  Hematological: Negative for adenopathy.  Psychiatric/Behavioral: Negative for confusion and dysphoric mood.        Objective:   Physical Exam  Constitutional: She is oriented to person, place, and time. She appears well-developed and well-nourished.  HENT:  Head: Normocephalic and atraumatic.  Mouth/Throat: Oropharynx is clear and moist.  Right canal and eardrum are normal. Left canal moderate cerumen  Eyes: EOM are normal. Pupils are equal, round, and reactive to light.  Neck: Normal range of motion. Neck supple. No thyromegaly present.  Cardiovascular: Normal rate, regular rhythm and normal heart sounds.   No murmur heard. Pulmonary/Chest: Breath sounds normal. No respiratory distress. She has no wheezes. She has no rales.  Abdominal: Soft. Bowel sounds are normal. She exhibits no distension and no mass. There is no tenderness. There is no rebound and no guarding.  Musculoskeletal: Normal range of motion. She exhibits no edema.  Lymphadenopathy:    She has no cervical adenopathy.  Neurological: She is alert and oriented to person, place, and time. She displays normal reflexes. No cranial nerve deficit.  Skin: No rash noted.  She has some scattered seborrheic keratoses. No concerning lesions.  Psychiatric: She has a normal mood and affect. Her behavior is normal. Judgment and thought content normal.          Assessment & Plan:  #1 Medicare wellness exam. Pneumovax and flu vaccine given. Set up DEXA scan. Continue yearly flu vaccine. Other immunizations up-to-date. Continue mammograms yearly #2 hypertension stable and at goal. Refill losartan for one year #3 history of recurrent depression. Currently stable. Very supportive family. Refill sertraline for one year #4 intermittent insomnia. Sleep hygiene discussed. Minimize Ambien use. #5 history of recurrent pancreatitis. Currently stable. Refill Zofran for as needed use #6 patient complained of some mild dysuria. Urine dipstick completely unremarkable. Reassurance

## 2015-07-22 NOTE — Progress Notes (Signed)
Pre visit review using our clinic review tool, if applicable. No additional management support is needed unless otherwise documented below in the visit note. 

## 2015-09-03 DIAGNOSIS — J029 Acute pharyngitis, unspecified: Secondary | ICD-10-CM | POA: Diagnosis not present

## 2015-09-03 DIAGNOSIS — J069 Acute upper respiratory infection, unspecified: Secondary | ICD-10-CM | POA: Diagnosis not present

## 2015-09-14 ENCOUNTER — Telehealth: Payer: Self-pay | Admitting: Family Medicine

## 2015-09-14 MED ORDER — FLUTICASONE PROPIONATE 50 MCG/ACT NA SUSP: 1.0000 | NASAL | Status: DC | PRN

## 2015-09-14 MED ORDER — SERTRALINE HCL 100 MG PO TABS: 100.0000 mg | ORAL_TABLET | Freq: Every day | ORAL | Status: DC

## 2015-09-14 MED ORDER — FLUCONAZOLE 100 MG PO TABS: 100.0000 mg | ORAL_TABLET | Freq: Every day | ORAL | Status: DC

## 2015-09-14 NOTE — Telephone Encounter (Signed)
Medication sent in for patient. 

## 2015-09-14 NOTE — Telephone Encounter (Signed)
Pt needs refills send to meds by mail [facility]. Pt needs sertraline, diflucan #12 AND flonase ns #30 for 90 day supply w/refills

## 2015-09-21 ENCOUNTER — Other Ambulatory Visit: Payer: Medicare Other

## 2015-09-22 ENCOUNTER — Ambulatory Visit (INDEPENDENT_AMBULATORY_CARE_PROVIDER_SITE_OTHER)
Admission: RE | Admit: 2015-09-22 | Discharge: 2015-09-22 | Disposition: A | Discharge status: Home / Self Care | Payer: Medicare Other | Source: Ambulatory Visit | Attending: Family Medicine | Admitting: Family Medicine

## 2015-09-22 DIAGNOSIS — Z780 Asymptomatic menopausal state: Secondary | ICD-10-CM

## 2016-03-20 ENCOUNTER — Emergency Department (HOSPITAL_COMMUNITY)
Admission: EM | Admit: 2016-03-20 | Discharge: 2016-03-21 | Disposition: A | Discharge status: Home / Self Care | Payer: Medicare Other | Attending: Emergency Medicine | Admitting: Emergency Medicine

## 2016-03-20 ENCOUNTER — Encounter (HOSPITAL_COMMUNITY): Payer: Self-pay

## 2016-03-20 DIAGNOSIS — Z79891 Long term (current) use of opiate analgesic: Secondary | ICD-10-CM | POA: Insufficient documentation

## 2016-03-20 DIAGNOSIS — I10 Essential (primary) hypertension: Secondary | ICD-10-CM | POA: Insufficient documentation

## 2016-03-20 DIAGNOSIS — R61 Generalized hyperhidrosis: Secondary | ICD-10-CM | POA: Diagnosis not present

## 2016-03-20 DIAGNOSIS — R1013 Epigastric pain: Secondary | ICD-10-CM

## 2016-03-20 DIAGNOSIS — F329 Major depressive disorder, single episode, unspecified: Secondary | ICD-10-CM | POA: Diagnosis not present

## 2016-03-20 DIAGNOSIS — Z79899 Other long term (current) drug therapy: Secondary | ICD-10-CM | POA: Insufficient documentation

## 2016-03-20 DIAGNOSIS — R110 Nausea: Secondary | ICD-10-CM | POA: Diagnosis not present

## 2016-03-20 DIAGNOSIS — K760 Fatty (change of) liver, not elsewhere classified: Secondary | ICD-10-CM | POA: Diagnosis not present

## 2016-03-20 LAB — COMPREHENSIVE METABOLIC PANEL
ALK PHOS: 94 U/L (ref 38–126)
ALT: 37 U/L (ref 14–54)
AST: 29 U/L (ref 15–41)
Albumin: 4.5 g/dL (ref 3.5–5.0)
Anion gap: 7 (ref 5–15)
BUN: 12 mg/dL (ref 6–20)
CALCIUM: 9.3 mg/dL (ref 8.9–10.3)
CO2: 23 mmol/L (ref 22–32)
CREATININE: 0.69 mg/dL (ref 0.44–1.00)
Chloride: 109 mmol/L (ref 101–111)
GFR calc non Af Amer: >60 mL/min (ref >60)
Glucose, Bld: 98 mg/dL (ref 65–99)
Potassium: 4.4 mmol/L (ref 3.5–5.1)
SODIUM: 139 mmol/L (ref 135–145)
Total Bilirubin: 0.9 mg/dL (ref 0.3–1.2)
Total Protein: 7.8 g/dL (ref 6.5–8.1)

## 2016-03-20 LAB — CBC
HCT: 47.3 % — ABNORMAL HIGH (ref 36.0–46.0)
Hemoglobin: 15.7 g/dL — ABNORMAL HIGH (ref 12.0–15.0)
MCH: 29.9 pg (ref 26.0–34.0)
MCHC: 33.2 g/dL (ref 30.0–36.0)
MCV: 90.1 fL (ref 78.0–100.0)
PLATELETS: 234 10*3/uL (ref 150–400)
RBC: 5.25 MIL/uL — AB (ref 3.87–5.11)
RDW: 13.2 % (ref 11.5–15.5)
WBC: 7 10*3/uL (ref 4.0–10.5)

## 2016-03-20 LAB — LIPASE, BLOOD: Lipase: 27 U/L (ref 11–51)

## 2016-03-20 MED ORDER — ONDANSETRON HCL 4 MG/2ML IJ SOLN
4.0000 mg | Freq: Once | INTRAMUSCULAR | Status: AC
  Administered 2016-03-21: 4 mg via INTRAVENOUS
  Filled 2016-03-20: qty 2

## 2016-03-20 MED ORDER — HYDROMORPHONE HCL 1 MG/ML IJ SOLN
0.5000 mg | Freq: Once | INTRAMUSCULAR | Status: AC
  Administered 2016-03-21: 0.5 mg via INTRAVENOUS
  Filled 2016-03-20: qty 1

## 2016-03-20 NOTE — ED Provider Notes (Signed)
CSN: [id number]     Arrival date & time 03/20/16  2002 History  By signing my name below, I, Ephriam Jenkins, attest that this documentation has been prepared under the direction and in the presence of Merryl Hacker, MD. Electronically signed, Ephriam Jenkins, ED Scribe. 03/21/2016. 1:12 AM.   Chief Complaint  Patient presents with  . Abdominal Pain   The history is provided by the patient. No language interpreter was used.   HPI Comments: Linda Blackburn is a 65 y.o. female with a PMHx of Pancreatitis, Cholecystectomy, who presents to the Emergency Department complaining of sudden onset epigastric abdominal pain that began around 1400 today. Pt reports two episodes of sudden onset tearing abdominal pain with associated diaphoresis at 1400 and at 1600 today. Pt also states it felt like she was going to pass out. Pt reports constant pain since the first episode but states the pain has been waxing and waning ever since. Pt reports 8/10 pain currently and describes it as "indigestion". Pt reports her pain is exacerbated by taking deep breaths. Pt states the pain feels similar to previous Pancreatitis. Pt reports intermittent episodes of vomiting with prior Pancreatitis but none today. Pt has taken an Oxycodone from a past prescription and reports mild relief. Pt states her only recent travel was to see her son in [place] during the middle of last month. Pt denies Hx of heart disease, DM    Past Medical History  Diagnosis Date  . Depression   . Pancreatitis   . Hay fever   . Urine incontinence   . UTI (urinary tract infection)     reoccuring   . Anxiety   . Gallstones   . HTN (hypertension)    Past Surgical History  Procedure Laterality Date  . Appendectomy  1984  . Cholecystectomy  09/08/1999  . Tonsillectomy    . Abdominal hysterectomy  1984    fibroids  . Ercp w/ sphincterotomy and balloon dilation  08/2008   Family History  Problem Relation Age of Onset  . Alcohol abuse Mother   .  Prostate cancer Father   . Stroke Maternal Aunt   . Prostate cancer Paternal Grandfather   . Hiatal hernia Maternal Aunt    Social History  Substance Use Topics  . Smoking status: Never Smoker   . Smokeless tobacco: Never Used  . Alcohol Use: 0.0 oz/week    0 Standard drinks or equivalent per week     Comment: occ   OB History    No data available     Review of Systems  Constitutional: Positive for diaphoresis.  Respiratory: Negative for shortness of breath.   Cardiovascular: Negative for chest pain.  Gastrointestinal: Positive for nausea and abdominal pain (epigastric). Negative for vomiting.  Genitourinary: Negative for dysuria.  All other systems reviewed and are negative.     Allergies  Buprenorphine hcl; Codeine; and Morphine and related  Home Medications   Prior to Admission medications   Medication Sig Start Date End Date Taking? Authorizing Provider  cetirizine (ZYRTEC) 10 MG tablet Take 10 mg by mouth daily as needed for allergies.    Yes Historical Provider, MD  famotidine (PEPCID) 20 MG tablet Take 20 mg by mouth daily.    Yes Historical Provider, MD  fluconazole (DIFLUCAN) 100 MG tablet Take 1 tablet (100 mg total) by mouth daily. 09/14/15  Yes [name], MD  fluticasone (FLONASE) 50 MCG/ACT nasal spray Place 1 spray into both nostrils as needed for allergies or  rhinitis. 09/14/15  Yes [name], MD  losartan (COZAAR) 100 MG tablet Take 1 tablet (100 mg total) by mouth daily. 07/22/15  Yes [name], MD  omeprazole (PRILOSEC) 20 MG capsule Take 1 capsule (20 mg total) by mouth daily. 03/21/16   Merryl Hacker, MD  ondansetron (ZOFRAN ODT) 4 MG disintegrating tablet Take 1 tablet (4 mg total) by mouth every 8 (eight) hours as needed for nausea or vomiting. 03/21/16   Merryl Hacker, MD  oxyCODONE (OXY IR/ROXICODONE) 5 MG immediate release tablet Take 5 mg by mouth once.   Yes Historical Provider, MD  oxyCODONE-acetaminophen  (PERCOCET/ROXICET) 5-325 MG tablet Take 1-2 tablets by mouth every 6 (six) hours as needed for severe pain. 03/21/16   Merryl Hacker, MD  sertraline (ZOLOFT) 100 MG tablet Take 1 tablet (100 mg total) by mouth daily. 09/14/15  Yes [name], MD  zolpidem (AMBIEN) 5 MG tablet Take 1 tablet (5 mg total) by mouth at bedtime as needed for sleep. 07/22/15  Yes Bruce Dan Europe, MD   BP 130/65 mmHg  Pulse 55  Temp(Src) 98 F (36.7 C) (Oral)  Resp 16  SpO2 99% Physical Exam  Constitutional: She is oriented to person, place, and time. She appears well-developed and well-nourished. No distress.  HENT:  Head: Normocephalic and atraumatic.  Cardiovascular: Normal rate, regular rhythm and normal heart sounds.   Pulmonary/Chest: Effort normal and breath sounds normal. No respiratory distress. She has no wheezes.  Abdominal: Soft. Bowel sounds are normal. There is tenderness. There is no rebound and no guarding.  Neurological: She is alert and oriented to person, place, and time.  Skin: Skin is warm and dry.  Psychiatric: She has a normal mood and affect.  Nursing note and vitals reviewed.   ED Course  Procedures  DIAGNOSTIC STUDIES: Oxygen Saturation is 100% on RA, normal by my interpretation.  COORDINATION OF CARE: 11:48 PM-Will order imaging. Discussed treatment plan with pt at bedside and pt agreed to plan.   Labs Review Labs Reviewed  CBC - Abnormal; Notable for the following:    RBC 5.25 (*)    Hemoglobin 15.7 (*)    HCT 47.3 (*)    All other components within normal limits  URINALYSIS, ROUTINE W REFLEX MICROSCOPIC (NOT AT [facility]) - Abnormal; Notable for the following:    Leukocytes, UA TRACE (*)    All other components within normal limits  URINE MICROSCOPIC-ADD ON - Abnormal; Notable for the following:    Squamous Epithelial / LPF 0-5 (*)    Bacteria, UA RARE (*)    All other components within normal limits  LIPASE, BLOOD  COMPREHENSIVE METABOLIC PANEL  TROPONIN I   LIPASE, BLOOD    Imaging Review Dg Chest 2 View  03/21/2016  CLINICAL DATA:  Epigastric pain EXAM: CHEST  2 VIEW COMPARISON:  June 10, 2014 FINDINGS: Stable mild cardiomegaly. The hila, mediastinum, lungs, and pleura are otherwise unremarkable. IMPRESSION: No active cardiopulmonary disease. Electronically Signed   By: Dorise Bullion III M.D   On: 03/21/2016 00:15   Ct Abdomen Pelvis W Contrast  03/21/2016  CLINICAL DATA:  Epigastric pain on and off EXAM: CT ABDOMEN AND PELVIS WITH CONTRAST TECHNIQUE: Multidetector CT imaging of the abdomen and pelvis was performed using the standard protocol following bolus administration of intravenous contrast. CONTRAST:  111mL ISOVUE-300 IOPAMIDOL (ISOVUE-300) INJECTION 61% COMPARISON:  06/07/2014 FINDINGS: Lower chest and abdominal wall: No contributory findings. Calcified granuloma right lower lobe Hepatobiliary: Hepatic  steatosis. No evidence of mass lesion.Cholecystectomy. Low insertion cystic duct with stable common bile duct diameter. Pancreas: Pancreas divisum by previous MRCP. No evidence of inflammation or ductal obstruction. Spleen: Unremarkable. Adrenals/Urinary Tract: Negative adrenals. No hydronephrosis or stone. Presumed tiny left renal cyst. Limited evaluation of the bladder due to decompressed state. Stomach/Bowel: No obstruction. Appendectomy. Mid duodenal diverticulum. Colonic diverticulosis. Reproductive:Hysterectomy.  No adnexal mass. Vascular/Lymphatic: No acute vascular abnormality. No mass or adenopathy. Other: No ascites or pneumoperitoneum. Musculoskeletal: No acute abnormalities. IMPRESSION: 1. No acute finding. 2. Hepatic steatosis. 3. Colonic diverticulosis. Electronically Signed   By: Monte Fantasia M.D.   On: 03/21/2016 01:34   I have personally reviewed and evaluated these images and lab results as part of my medical decision-making.   EKG Interpretation   Date/Time:  Tuesday March 21 2016 01:25:24 EDT Ventricular Rate:  53 PR  Interval:    QRS Duration: 104 QT Interval:  454 QTC Calculation: 427 R Axis:   -2 Text Interpretation:  Sinus rhythm Confirmed by HORTON  MD, COURTNEY  [id number]) on 03/21/2016 1:28:17 AM      MDM   Final diagnoses:  Epigastric pain    Patient presents with epigastric pain. History of pancreatitis. Similar symptoms in the past with her pancreatitis; however, she is not having the associated vomiting that she normally has. She also states that the pain is not quite as severe and feels "like indigestion." She is nontoxic. Vital signs reassuring. Tender without signs of peritonitis on exam. Patient was given pain and nausea medication. Initial lab work is reassuring including LFTs and lipase. EKG and troponin are also negative. Given patient's ongoing pain, CT scan abdomen and pelvis obtained and negative for pancreatic swelling or any other abnormality. Normal caliber aorta. Repeat lipase is unchanged. Patient was given a GI cocktail and omeprazole. Minimal improvement but is tolerating fluids.  Abdominal exam is reassuring. Discussed with patient the lab results and imaging. Will discharge home on omeprazole daily and GI follow-up. She was encouraged return if her symptoms worsen.  After history, exam, and medical workup I feel the patient has been appropriately medically screened and is safe for discharge home. Pertinent diagnoses were discussed with the patient. Patient was given return precautions.  I personally performed the services described in this documentation, which was scribed in my presence. The recorded information has been reviewed and is accurate.     Merryl Hacker, MD 03/21/16 [phone]

## 2016-03-20 NOTE — ED Notes (Signed)
Patient c/o upper mid abdominal pain that began about 1400.  Patient states that has chronic pancreatitis and explains that she is hospitalized for it about every 2 years.  Patient states that had some SOB due to the pain  "taking your breath away".  Patient denies chest pain, denies N/V/D.  Patient rates pain 7/10.

## 2016-03-21 ENCOUNTER — Emergency Department (HOSPITAL_COMMUNITY): Payer: Medicare Other

## 2016-03-21 ENCOUNTER — Encounter (HOSPITAL_COMMUNITY): Payer: Self-pay

## 2016-03-21 DIAGNOSIS — K760 Fatty (change of) liver, not elsewhere classified: Secondary | ICD-10-CM | POA: Diagnosis not present

## 2016-03-21 DIAGNOSIS — R1013 Epigastric pain: Secondary | ICD-10-CM | POA: Diagnosis not present

## 2016-03-21 LAB — URINE MICROSCOPIC-ADD ON: RBC / HPF: NONE SEEN RBC/hpf (ref 0–5)

## 2016-03-21 LAB — URINALYSIS, ROUTINE W REFLEX MICROSCOPIC
Bilirubin Urine: NEGATIVE
Glucose, UA: NEGATIVE mg/dL
Hgb urine dipstick: NEGATIVE
Ketones, ur: NEGATIVE mg/dL
NITRITE: NEGATIVE
PROTEIN: NEGATIVE mg/dL
SPECIFIC GRAVITY, URINE: 1.018 (ref 1.005–1.030)
pH: 5 (ref 5.0–8.0)

## 2016-03-21 LAB — TROPONIN I

## 2016-03-21 LAB — LIPASE, BLOOD: Lipase: 26 U/L (ref 11–51)

## 2016-03-21 MED ORDER — OXYCODONE-ACETAMINOPHEN 5-325 MG PO TABS
1.0000 | ORAL_TABLET | Freq: Once | ORAL | Status: AC
  Administered 2016-03-21: 1 via ORAL
  Filled 2016-03-21: qty 1

## 2016-03-21 MED ORDER — PANTOPRAZOLE SODIUM 40 MG PO TBEC
40.0000 mg | DELAYED_RELEASE_TABLET | Freq: Once | ORAL | Status: AC
  Administered 2016-03-21: 40 mg via ORAL
  Filled 2016-03-21: qty 1

## 2016-03-21 MED ORDER — OXYCODONE-ACETAMINOPHEN 5-325 MG PO TABS: 1.0000 | ORAL_TABLET | Freq: Four times a day (QID) | ORAL | Status: DC | PRN

## 2016-03-21 MED ORDER — OMEPRAZOLE 20 MG PO CPDR: 20.0000 mg | DELAYED_RELEASE_CAPSULE | Freq: Every day | ORAL | Status: DC

## 2016-03-21 MED ORDER — ONDANSETRON 4 MG PO TBDP: 4.0000 mg | ORAL_TABLET | Freq: Three times a day (TID) | ORAL | Status: DC | PRN

## 2016-03-21 MED ORDER — IOPAMIDOL (ISOVUE-300) INJECTION 61%
100.0000 mL | Freq: Once | INTRAVENOUS | Status: AC | PRN
  Administered 2016-03-21: 100 mL via INTRAVENOUS

## 2016-03-21 MED ORDER — GI COCKTAIL ~~LOC~~
30.0000 mL | Freq: Once | ORAL | Status: AC
Start: 2016-03-21 — End: 2016-03-21
  Administered 2016-03-21: 30 mL via ORAL
  Filled 2016-03-21: qty 30

## 2016-03-21 NOTE — ED Notes (Signed)
Pt given ginger ale. Tolerated without difficulty or pain

## 2016-03-21 NOTE — ED Notes (Signed)
Pt transported to CT scan.

## 2016-03-21 NOTE — ED Notes (Signed)
Pt not in room.  Will complete EKG upon return.

## 2016-03-21 NOTE — Discharge Instructions (Signed)
You were seen today for abdominal pain. There is no evidence of pancreatitis at this time. You'll be started on a stronger acid reducer. If you develop new or any worsening symptoms she needs to be reevaluated immediately. Follow-up with your GI doctor if symptoms persist.  Abdominal Pain, Adult Many things can cause abdominal pain. Usually, abdominal pain is not caused by a disease and will improve without treatment. It can often be observed and treated at home. Your health care provider will do a physical exam and possibly order blood tests and X-rays to help determine the seriousness of your pain. However, in many cases, more time must pass before a clear cause of the pain can be found. Before that point, your health care provider may not know if you need more testing or further treatment. HOME CARE INSTRUCTIONS Monitor your abdominal pain for any changes. The following actions may help to alleviate any discomfort you are experiencing:  Only take over-the-counter or prescription medicines as directed by your health care provider.  Do not take laxatives unless directed to do so by your health care provider.  Try a clear liquid diet (broth, tea, or water) as directed by your health care provider. Slowly move to a bland diet as tolerated. SEEK MEDICAL CARE IF:  You have unexplained abdominal pain.  You have abdominal pain associated with nausea or diarrhea.  You have pain when you urinate or have a bowel movement.  You experience abdominal pain that wakes you in the night.  You have abdominal pain that is worsened or improved by eating food.  You have abdominal pain that is worsened with eating fatty foods.  You have a fever. SEEK IMMEDIATE MEDICAL CARE IF:  Your pain does not go away within 2 hours.  You keep throwing up (vomiting).  Your pain is felt only in portions of the abdomen, such as the right side or the left lower portion of the abdomen.  You pass bloody or black tarry  stools. MAKE SURE YOU:  Understand these instructions.  Will watch your condition.  Will get help right away if you are not doing well or get worse.   This information is not intended to replace advice given to you by your health care provider. Make sure you discuss any questions you have with your health care provider.   Document Released: 05/31/2005 Document Revised: 05/12/2015 Document Reviewed: 04/30/2013 Elsevier Interactive Patient Education [employer].

## 2016-04-04 ENCOUNTER — Other Ambulatory Visit: Payer: Self-pay | Admitting: Family Medicine

## 2016-04-04 DIAGNOSIS — Z1231 Encounter for screening mammogram for malignant neoplasm of breast: Secondary | ICD-10-CM

## 2016-05-22 ENCOUNTER — Ambulatory Visit
Admission: RE | Admit: 2016-05-22 | Discharge: 2016-05-22 | Disposition: A | Discharge status: Home / Self Care | Payer: Medicare Other | Source: Ambulatory Visit | Attending: Family Medicine | Admitting: Family Medicine

## 2016-05-22 DIAGNOSIS — Z1231 Encounter for screening mammogram for malignant neoplasm of breast: Secondary | ICD-10-CM

## 2016-05-24 LAB — HM MAMMOGRAPHY: HM Mammogram: NORMAL (ref 0–4)

## 2016-06-30 ENCOUNTER — Ambulatory Visit (INDEPENDENT_AMBULATORY_CARE_PROVIDER_SITE_OTHER): Payer: Medicare Other

## 2016-06-30 DIAGNOSIS — Z23 Encounter for immunization: Secondary | ICD-10-CM

## 2016-07-17 ENCOUNTER — Other Ambulatory Visit (INDEPENDENT_AMBULATORY_CARE_PROVIDER_SITE_OTHER): Payer: Medicare Other

## 2016-07-17 DIAGNOSIS — Z0000 Encounter for general adult medical examination without abnormal findings: Secondary | ICD-10-CM | POA: Diagnosis not present

## 2016-07-17 LAB — CBC WITH DIFFERENTIAL/PLATELET
BASOS ABS: 0.1 10*3/uL (ref 0.0–0.1)
Basophils Relative: 0.9 % (ref 0.0–3.0)
Eosinophils Absolute: 0.1 10*3/uL (ref 0.0–0.7)
Eosinophils Relative: 2.3 % (ref 0.0–5.0)
HCT: 44.2 % (ref 36.0–46.0)
Hemoglobin: 14.9 g/dL (ref 12.0–15.0)
LYMPHS ABS: 2.8 10*3/uL (ref 0.7–4.0)
LYMPHS PCT: 45.2 % (ref 12.0–46.0)
MCHC: 33.6 g/dL (ref 30.0–36.0)
MCV: 88.2 fl (ref 78.0–100.0)
MONOS PCT: 7.2 % (ref 3.0–12.0)
Monocytes Absolute: 0.5 10*3/uL (ref 0.1–1.0)
NEUTROS PCT: 44.4 % (ref 43.0–77.0)
Neutro Abs: 2.8 10*3/uL (ref 1.4–7.7)
Platelets: 264 10*3/uL (ref 150.0–400.0)
RBC: 5.01 Mil/uL (ref 3.87–5.11)
RDW: 13.6 % (ref 11.5–15.5)
WBC: 6.3 10*3/uL (ref 4.0–10.5)

## 2016-07-17 LAB — TSH: TSH: 1.96 u[IU]/mL (ref 0.35–4.50)

## 2016-07-17 LAB — LIPID PANEL
Cholesterol: 177 mg/dL (ref 0–200)
HDL: 45.6 mg/dL (ref >39.00)
LDL CALC: 106 mg/dL — AB (ref 0–99)
NONHDL: 131.05
Total CHOL/HDL Ratio: 4
Triglycerides: 125 mg/dL (ref 0.0–149.0)
VLDL: 25 mg/dL (ref 0.0–40.0)

## 2016-07-17 LAB — BASIC METABOLIC PANEL
BUN: 13 mg/dL (ref 6–23)
CALCIUM: 9.2 mg/dL (ref 8.4–10.5)
CO2: 23 mEq/L (ref 19–32)
Chloride: 109 mEq/L (ref 96–112)
Creatinine, Ser: 0.75 mg/dL (ref 0.40–1.20)
GFR: 81.79 mL/min (ref >60.00)
GLUCOSE: 98 mg/dL (ref 70–99)
Potassium: 3.4 mEq/L — ABNORMAL LOW (ref 3.5–5.1)
SODIUM: 143 meq/L (ref 135–145)

## 2016-07-17 LAB — HEPATIC FUNCTION PANEL
ALBUMIN: 4.4 g/dL (ref 3.5–5.2)
ALK PHOS: 86 U/L (ref 39–117)
ALT: 32 U/L (ref 0–35)
AST: 24 U/L (ref 0–37)
Bilirubin, Direct: 0.1 mg/dL (ref 0.0–0.3)
Total Bilirubin: 0.7 mg/dL (ref 0.2–1.2)
Total Protein: 7.3 g/dL (ref 6.0–8.3)

## 2016-07-24 ENCOUNTER — Other Ambulatory Visit: Payer: Medicare Other

## 2016-08-07 ENCOUNTER — Ambulatory Visit (INDEPENDENT_AMBULATORY_CARE_PROVIDER_SITE_OTHER): Payer: Medicare Other | Admitting: Family Medicine

## 2016-08-07 ENCOUNTER — Encounter: Payer: Self-pay | Admitting: Family Medicine

## 2016-08-07 VITALS — BP 128/94 | HR 100 | Temp 97.7°F | Ht 61.25 in | Wt 223.6 lb

## 2016-08-07 DIAGNOSIS — L821 Other seborrheic keratosis: Secondary | ICD-10-CM

## 2016-08-07 DIAGNOSIS — I10 Essential (primary) hypertension: Secondary | ICD-10-CM

## 2016-08-07 DIAGNOSIS — F4323 Adjustment disorder with mixed anxiety and depressed mood: Secondary | ICD-10-CM

## 2016-08-07 DIAGNOSIS — Q453 Other congenital malformations of pancreas and pancreatic duct: Secondary | ICD-10-CM | POA: Diagnosis not present

## 2016-08-07 DIAGNOSIS — F5104 Psychophysiologic insomnia: Secondary | ICD-10-CM

## 2016-08-07 DIAGNOSIS — Z0000 Encounter for general adult medical examination without abnormal findings: Secondary | ICD-10-CM

## 2016-08-07 DIAGNOSIS — N939 Abnormal uterine and vaginal bleeding, unspecified: Secondary | ICD-10-CM

## 2016-08-07 MED ORDER — OXYCODONE HCL 5 MG PO TABS: 5.0000 mg | ORAL_TABLET | Freq: Once | ORAL | 0 refills | Status: AC

## 2016-08-07 MED ORDER — LOSARTAN POTASSIUM 100 MG PO TABS: 100.0000 mg | ORAL_TABLET | Freq: Every day | ORAL | 3 refills | Status: DC

## 2016-08-07 MED ORDER — FLUTICASONE PROPIONATE 50 MCG/ACT NA SUSP: 1.0000 | NASAL | 3 refills | Status: DC | PRN

## 2016-08-07 MED ORDER — SERTRALINE HCL 100 MG PO TABS: 100.0000 mg | ORAL_TABLET | Freq: Every day | ORAL | 3 refills | Status: DC

## 2016-08-07 MED ORDER — FLUCONAZOLE 100 MG PO TABS: 100.0000 mg | ORAL_TABLET | Freq: Every day | ORAL | 1 refills | Status: DC

## 2016-08-07 MED ORDER — ONDANSETRON 4 MG PO TBDP: 4.0000 mg | ORAL_TABLET | Freq: Three times a day (TID) | ORAL | 0 refills | Status: DC | PRN

## 2016-08-07 MED ORDER — ZOLPIDEM TARTRATE 5 MG PO TABS: 5.0000 mg | ORAL_TABLET | Freq: Every evening | ORAL | 1 refills | Status: DC | PRN

## 2016-08-07 NOTE — Patient Instructions (Addendum)

## 2016-08-07 NOTE — Progress Notes (Signed)
Subjective:     Patient ID: Linda Blackburn, female   DOB: 05-28-1951, 65 y.o.   MRN: [id number]  HPI Patient here for Medicare subsequent annual wellness visit and medical follow-up. Her chronic problems include history of obesity, history of recurrent pancreatitis( idiopathic), history of recurrent depression which has been stable on sertraline, hypertension, dyslipidemia  She's had previous hysterectomy and 1 ovary she states was removed. She has recently had reports of possible bloodstains on her underwear. She feels this is coming from vagina. She's never noted any blood in her urine. Colonoscopy up-to-date. No bloody stools. No history of hepatitis C screening but low risk. Mammogram up-to-date.  History of recurrent depression stable on sertraline. Requesting refills.  She has irritated seborrheic keratosis mid lower lumbar area and requesting treatment. No itching or bleeding from that skin lesion.  No consistent exercise. Nonsmoker  Past Medical History:  Diagnosis Date  . Anxiety   . Depression   . Gallstones   . Hay fever   . HTN (hypertension)   . Pancreatitis   . Urine incontinence   . UTI (urinary tract infection)    reoccuring    Past Surgical History:  Procedure Laterality Date  . ABDOMINAL HYSTERECTOMY  1984   fibroids  . APPENDECTOMY  1984  . CHOLECYSTECTOMY  09/08/1999  . ERCP W/ SPHINCTEROTOMY AND BALLOON DILATION  08/2008  . TONSILLECTOMY      reports that she has never smoked. She has never used smokeless tobacco. She reports that she drinks alcohol. She reports that she does not use drugs. family history includes Alcohol abuse in her mother; Hiatal hernia in her maternal aunt; Prostate cancer in her father and paternal grandfather; Stroke in her maternal aunt. Allergies  Allergen Reactions  . Buprenorphine Hcl     "In a scarry movie" weird thoughts  . Codeine     GI upset  . Morphine And Related     "In a scarry movie" weird thoughts    1.  Risk factors  based on Past Medical , Social, and Family history reviewed and as indicated above with no changes 2.  Limitations in physical activities None.  No recent falls. No regular exercise 3.  Depression/mood No active depression or anxiety issues.  Depression stable on Sertraline. 4.  Hearing No defiits 5.  ADLs independent in all. 6.  Cognitive function (orientation to time and place, language, writing, speech,memory) no short or long term memory issues.  Language and judgement intact. 7.  Home Safety no issues 8.  Height, weight, and visual acuity.all stable. 9.  Counseling discussed need to lose some weight. 10. Recommendation of preventive services. Hep C screening-though low risk. 11. Labs based on risk factors-Hep C antibody. 12. Care Plan as above. 13. Other Providers-GI-Dr Ardis Hughs. 14. Written schedule of screening/prevention services given to patient.   Review of Systems  Constitutional: Negative for fatigue.  HENT: Negative for trouble swallowing.   Eyes: Negative for visual disturbance.  Respiratory: Negative for cough, chest tightness, shortness of breath and wheezing.   Cardiovascular: Negative for chest pain, palpitations and leg swelling.  Gastrointestinal: Positive for diarrhea. Negative for abdominal pain, nausea and vomiting.  Endocrine: Negative for polydipsia and polyuria.  Genitourinary: Negative for dysuria and hematuria.  Musculoskeletal: Negative for back pain.  Neurological: Negative for dizziness, seizures, syncope, weakness, light-headedness and headaches.  Hematological: Negative for adenopathy.  Psychiatric/Behavioral: Negative for dysphoric mood.       Objective:   Physical Exam  Constitutional: She is oriented  to person, place, and time. She appears well-developed and well-nourished.  HENT:  Head: Normocephalic and atraumatic.  Eyes: EOM are normal. Pupils are equal, round, and reactive to light.  Neck: Normal range of motion. Neck supple. No thyromegaly  present.  Cardiovascular: Normal rate, regular rhythm and normal heart sounds.   No murmur heard. Pulmonary/Chest: Breath sounds normal. No respiratory distress. She has no wheezes. She has no rales.  Abdominal: Soft. Bowel sounds are normal. She exhibits no distension and no mass. There is no tenderness. There is no rebound and no guarding.  Genitourinary: No vaginal discharge found.  Genitourinary Comments: Normal external genitalia.  S/p hysterectomy.  No intravaginal lesions.  Minimal erythema right vaginal wall but no bleeding and no skin lesions.  Musculoskeletal: Normal range of motion. She exhibits no edema.  Lymphadenopathy:    She has no cervical adenopathy.  Neurological: She is alert and oriented to person, place, and time. She displays normal reflexes. No cranial nerve deficit.  Skin: No rash noted.  Benign-appearing well-demarcated brownish scaly seborrheic keratosis mid lumbar back  Psychiatric: She has a normal mood and affect. Her behavior is normal. Judgment and thought content normal.       Assessment:     #1 Medicare subsequent annual wellness visit. Immunizations are up-to-date. No history of hepatitis C screening-but low risk  #2 hypertension stable  #3 dyslipidemia  #4 history of recurrent depression currently stable on sertraline  #5 irritated seborrheic keratoses of the back  #6 possible vaginal spotting on underwear. Previous hysterectomy.  Vaginal exam no obvious source.    #7 minimally low potassium on recent labs    Plan:     -Increase potassium rich foods -Lose some weight -Pelvic exam to look for source of bleeding as above -Limited refill of Zofran and oxycodone to have only a few on hand if she has recurrent severe pancreatitis flare -Avoid regular use of sedatives such as Ambien. 1 refill given  [name] MD [facility]

## 2016-08-07 NOTE — Progress Notes (Signed)
Pre visit review using our clinic review tool, if applicable. No additional management support is needed unless otherwise documented below in the visit note. 

## 2016-08-08 ENCOUNTER — Encounter: Payer: Self-pay | Admitting: Family Medicine

## 2016-08-08 LAB — HEPATITIS C ANTIBODY: HCV Ab: NEGATIVE

## 2016-08-11 ENCOUNTER — Ambulatory Visit (INDEPENDENT_AMBULATORY_CARE_PROVIDER_SITE_OTHER): Payer: Medicare Other | Admitting: Family Medicine

## 2016-08-11 VITALS — BP 120/80 | HR 84

## 2016-08-11 DIAGNOSIS — L820 Inflamed seborrheic keratosis: Secondary | ICD-10-CM

## 2016-08-11 NOTE — Patient Instructions (Signed)
Seborrheic Keratosis Seborrheic keratosis is a common, noncancerous (benign) skin growth. This condition causes waxy, rough, tan, brown, or black spots to appear on the skin. These skin growths can be flat or raised. What are the causes? The cause of this condition is not known. What increases the risk? This condition is more likely to develop in:  People who have a family history of seborrheic keratosis.  People who are 50 or older.  People who are pregnant.  People who have had estrogen replacement therapy.  What are the signs or symptoms? This condition often occurs on the face, chest, shoulders, back, or other areas. These growths:  Are usually painless, but may become irritated and itchy.  Can be yellow, brown, black, or other colors.  Are slightly raised or have a flat surface.  Are sometimes rough or wart-like in texture.  Are often waxy on the surface.  Are round or oval-shaped.  Sometimes look like they are "stuck on."  Often occur in groups, but may occur as a single growth.  How is this diagnosed? This condition is diagnosed with a medical history and physical exam. A sample of the growth may be tested (skin biopsy). You may need to see a skin specialist (dermatologist). How is this treated? Treatment is not usually needed for this condition, unless the growths are irritated or are often bleeding. You may also choose to have the growths removed if you do not like their appearance. Most commonly, these growths are treated with a procedure in which liquid nitrogen is applied to "freeze" off the growth (cryosurgery). They may also be burned off with electricity or cut off. Follow these instructions at home:  Watch your growth for any changes.  Keep all follow-up visits as told by your health care provider. This is important.  Do not scratch or pick at the growth or growths. This can cause them to become irritated or infected. Contact a health care provider  if:  You suddenly have many new growths.  Your growth bleeds, itches, or hurts.  Your growth suddenly becomes larger or changes color. This information is not intended to replace advice given to you by your health care provider. Make sure you discuss any questions you have with your health care provider. Document Released: 09/23/2010 Document Revised: 01/27/2016 Document Reviewed: 01/06/2015 Elsevier Interactive Patient Education  2017 Elsevier Inc.  

## 2016-08-11 NOTE — Progress Notes (Signed)
Subjective:     Patient ID: Linda Blackburn, female   DOB: [birth year], 65 y.o.   MRN: [id number]  HPI  She is here for procedure only visit. Patient was recently seen for physical. She's had couple of skin lesions that have been irritating recently because of rubbing clothing or frequently scraped with a brush. She has one on her left face and another midline lower lumbar region which are irritating because of location. She's not noted any recent change in growth. They're sometimes pruritic. No prior history of known skin cancer.  Past Medical History:  Diagnosis Date  . Anxiety   . Depression   . Gallstones   . Hay fever   . HTN (hypertension)   . Pancreatitis   . Urine incontinence   . UTI (urinary tract infection)    reoccuring    Past Surgical History:  Procedure Laterality Date  . ABDOMINAL HYSTERECTOMY  1984   fibroids  . APPENDECTOMY  1984  . CHOLECYSTECTOMY  09/08/1999  . ERCP W/ SPHINCTEROTOMY AND BALLOON DILATION  08/2008  . TONSILLECTOMY      reports that she has never smoked. She has never used smokeless tobacco. She reports that she drinks alcohol. She reports that she does not use drugs. family history includes Alcohol abuse in her mother; Hiatal hernia in her maternal aunt; Prostate cancer in her father and paternal grandfather; Stroke in her maternal aunt. Allergies  Allergen Reactions  . Buprenorphine Hcl     "In a scarry movie" weird thoughts  . Codeine     GI upset  . Morphine And Related     "In a scarry movie" weird thoughts     Review of Systems  Constitutional: Negative for appetite change and unexpected weight change.       Objective:   Physical Exam  Constitutional: She appears well-developed and well-nourished.  Cardiovascular: Normal rate and regular rhythm.   Pulmonary/Chest: Effort normal and breath sounds normal. No respiratory distress. She has no wheezes. She has no rales.  Skin:  Patient has well-demarcated brownish scaly lesion left side  of face just anterior to the ear-approximately 5 mm diameter  She has a larger well-demarcated brownish skin lesion mid lower lumbar area which is about 8 mm diameter       Assessment:     Seborrheic keratoses. She's had irritation from both because of location. The lower lumbar skin lesion is irritated because of clothing and she frequently scrapes against the one on the left-sided face when brushing her hair    Plan:     -We discussed risk and benefits of treatment with cryotherapy and patient is requesting treatment today. She consents to therapy after discussion of risks including pain, blistering, infection -Both lesions were treated with cryotherapy and she tolerated well -Keep clean with soap and water and follow-up as needed  [name] MD [facility]

## 2016-08-11 NOTE — Progress Notes (Signed)
Pre visit review using our clinic review tool, if applicable. No additional management support is needed unless otherwise documented below in the visit note. 

## 2016-09-06 ENCOUNTER — Telehealth: Payer: Self-pay | Admitting: Family Medicine

## 2016-09-06 DIAGNOSIS — N939 Abnormal uterine and vaginal bleeding, unspecified: Secondary | ICD-10-CM

## 2016-09-06 NOTE — Telephone Encounter (Signed)
° ° °  Pt call to say that Dr Elease Hashimoto told her to call after the holidays that he was going to refer her to a GYN  Doctor because of vagina bleeding.    [phone]

## 2016-09-06 NOTE — Telephone Encounter (Signed)
Okay to put in referral. 

## 2016-09-06 NOTE — Telephone Encounter (Signed)
Yes.  Would go ahead and refer for any vaginal bleeding.

## 2016-09-07 ENCOUNTER — Telehealth: Payer: Self-pay | Admitting: Obstetrics & Gynecology

## 2016-09-07 NOTE — Telephone Encounter (Signed)
Left a message for pt to call the office back in regards to putting in gyn referral.

## 2016-09-07 NOTE — Telephone Encounter (Signed)
Called and left a message for patient to call back to schedule a new patient doctor referral. °

## 2016-09-08 NOTE — Telephone Encounter (Signed)
Left pt a message informing her that referral was put in for gyn someone should be contacting her for an appointment

## 2016-09-14 ENCOUNTER — Encounter: Payer: Self-pay | Admitting: Obstetrics and Gynecology

## 2016-09-14 ENCOUNTER — Ambulatory Visit (INDEPENDENT_AMBULATORY_CARE_PROVIDER_SITE_OTHER): Payer: Medicare Other | Admitting: Obstetrics and Gynecology

## 2016-09-14 VITALS — BP 110/78 | HR 76 | Resp 16 | Ht 61.25 in | Wt 220.0 lb

## 2016-09-14 DIAGNOSIS — Z1272 Encounter for screening for malignant neoplasm of vagina: Secondary | ICD-10-CM

## 2016-09-14 DIAGNOSIS — N950 Postmenopausal bleeding: Secondary | ICD-10-CM

## 2016-09-14 DIAGNOSIS — N8111 Cystocele, midline: Secondary | ICD-10-CM | POA: Diagnosis not present

## 2016-09-14 DIAGNOSIS — R1032 Left lower quadrant pain: Secondary | ICD-10-CM

## 2016-09-14 DIAGNOSIS — R102 Pelvic and perineal pain: Secondary | ICD-10-CM | POA: Diagnosis not present

## 2016-09-14 DIAGNOSIS — N393 Stress incontinence (female) (male): Secondary | ICD-10-CM | POA: Diagnosis not present

## 2016-09-14 LAB — POCT URINALYSIS DIPSTICK
BILIRUBIN UA: NEGATIVE
GLUCOSE UA: NEGATIVE
KETONES UA: NEGATIVE
Leukocytes, UA: NEGATIVE
Nitrite, UA: NEGATIVE
Protein, UA: NEGATIVE
RBC UA: NEGATIVE
Urobilinogen, UA: NEGATIVE
pH, UA: 6.5

## 2016-09-14 NOTE — Progress Notes (Signed)
66 y.o. [id number] WidowedCaucasianF here for a consultation by Dr Carolann Littler for postmenopausal bleeding. She has noticed small amounts of spotting over the last several years. More in the last 7 months. In 11/17 she noticed an increase in brown spotting.  She had a TAH in her 30's, a few years later she had a single SO for severe endometriosis.  She has had 2 episodes of pain in her LLQ in the last month. The pain is dull to sharp LLQ , worse to move. The pain restarted yesterday. The pain is up to 5-6/10 in severity. She goes from having diarrhea to constipation, long term.  No urinary frequency, urgency or pain. She does have a h/o GSI, mild. With the spotting she occasionally notices a urine, no current bleeding or odor.  She has a h/o recurring pancreatitis for almost 20 years.        No LMP recorded. Patient has had a hysterectomy.          Sexually active: No.  The current method of family planning is status post hysterectomy.    Exercising: Yes.    Walk Smoker:  no  Health Maintenance: Pap:  Several years ago with Dr. Elease Hashimoto History of abnormal Pap:  no MMG: 05/22/16 [facility] Colonoscopy: 11/03/14 Dr. Yancey Flemings polyp,  BMD:  09/22/15-Osteopenia, repeat 2 years TDaP: 04/01/2010   reports that she has never smoked. She has never used smokeless tobacco. She reports that she drinks alcohol. She reports that she does not use drugs.  Past Medical History:  Diagnosis Date  . Abnormal uterine bleeding   . Amenorrhea   . Anxiety   . Depression   . Gallstones   . Hay fever   . HTN (hypertension)   . Pancreatitis   . Urine incontinence   . UTI (urinary tract infection)    reoccuring     Past Surgical History:  Procedure Laterality Date  . ABDOMINAL HYSTERECTOMY  1984   fibroids  . APPENDECTOMY  1984  . CHOLECYSTECTOMY  09/08/1999  . ERCP W/ SPHINCTEROTOMY AND BALLOON DILATION  08/2008  . OOPHORECTOMY     Only 1 was taken  . TONSILLECTOMY    .  TUBAL LIGATION      Current Outpatient Prescriptions  Medication Sig Dispense Refill  . cetirizine (ZYRTEC) 10 MG tablet Take 10 mg by mouth daily as needed for allergies.     . famotidine (PEPCID) 20 MG tablet Take 20 mg by mouth daily.     . fluticasone (FLONASE) 50 MCG/ACT nasal spray Place 1 spray into both nostrils as needed for allergies or rhinitis. 48 g 3  . losartan (COZAAR) 100 MG tablet Take 1 tablet (100 mg total) by mouth daily. 90 tablet 3  . ondansetron (ZOFRAN ODT) 4 MG disintegrating tablet Take 1 tablet (4 mg total) by mouth every 8 (eight) hours as needed for nausea or vomiting. 20 tablet 0  . sertraline (ZOLOFT) 100 MG tablet Take 1 tablet (100 mg total) by mouth daily. 90 tablet 3  . zolpidem (AMBIEN) 5 MG tablet Take 1 tablet (5 mg total) by mouth at bedtime as needed for sleep. 30 tablet 1   No current facility-administered medications for this visit.     Family History  Problem Relation Age of Onset  . Alcohol abuse Mother   . Prostate cancer Father   . Stroke Maternal Aunt   . Hiatal hernia Maternal Aunt   . Prostate cancer Paternal Grandfather  Review of Systems  Constitutional: Negative.   HENT: Negative.   Eyes: Negative.   Respiratory: Negative.   Cardiovascular: Negative.   Gastrointestinal: Negative.   Endocrine: Negative.   Genitourinary:       Postmenopause bleeding  Musculoskeletal:       L Groin pain "pulling" sensation  Skin: Negative.   Allergic/Immunologic: Negative.   Neurological: Negative.   Hematological: Negative.   Psychiatric/Behavioral: Negative.     Exam:   BP 110/78 (BP Location: Right Arm, Patient Position: Sitting, Cuff Size: Normal)   Pulse 76   Resp 16   Ht 5' 1.25" (1.556 m)   Wt 220 lb (99.8 kg)   BMI 41.23 kg/m   Weight change: @WEIGHTCHANGE @ Height:   Height: 5' 1.25" (155.6 cm)  Ht Readings from Last 3 Encounters:  09/14/16 5' 1.25" (1.556 m)  08/07/16 5' 1.25" (1.556 m)  07/22/15 5' 1.25" (1.556 m)     General appearance: alert, cooperative and appears stated age Head: Normocephalic, without obvious abnormality, atraumatic Neck: no adenopathy, supple, symmetrical, trachea midline and thyroid normal to inspection and palpation Lungs: clear to auscultation bilaterally Cardiovascular: regular rate and rhythm Abdomen: soft, non-tender; bowel sounds normal; no masses,  no organomegaly Extremities: extremities normal, atraumatic, no cyanosis or edema Skin: Skin color, texture, turgor normal. No rashes or lesions Lymph nodes: Cervical, supraclavicular, and axillary nodes normal. No abnormal inguinal nodes palpated Neurologic: Grossly normal   Pelvic: External genitalia:  no lesions              Urethra:  normal appearing urethra with no masses, tenderness or lesions              Bartholins and Skenes: normal                 Vagina: normal appearing vagina with mild atrophy, no signs of irritation. Moist. Grade 1 cystocele and grade 1 recotcele              Cervix: absent               Bimanual Exam:  Uterus:  uterus absent              Adnexa: no masses, tender on the left               Rectovaginal: Confirms               Anus:  normal sphincter tone, hemorrhoid palpated, no blood.   Chaperone was present for exam.  A:  Postmenopausal bleeding, h/o hysterectomy. No obvious source of bleeding, normal exam  Possible sources of the bleeding, could be her vagina, bladder or rectum.   LLQ abdominal/pelvic pain, tender on BM exam in the left adnexa, no masses  GSI  P:   CCUA  Pap from vaginal apex  Return for GYN ultrasound  She will pay very close attention to where the blood may be coming from  Come in when she is bleeding if possible for further evaluation  Would check a wet prep probe if having symptoms of an odor  CC: Dr Carolann Littler

## 2016-09-15 DIAGNOSIS — Z1272 Encounter for screening for malignant neoplasm of vagina: Secondary | ICD-10-CM | POA: Diagnosis not present

## 2016-09-15 LAB — URINALYSIS, MICROSCOPIC ONLY
Bacteria, UA: NONE SEEN [HPF]
Casts: NONE SEEN [LPF]
Crystals: NONE SEEN [HPF]
Squamous Epithelial / LPF: NONE SEEN [HPF] (ref <=5)
WBC UA: NONE SEEN WBC/HPF (ref <=5)
Yeast: NONE SEEN [HPF]

## 2016-09-16 LAB — URINE CULTURE

## 2016-09-20 LAB — IPS PAP TEST WITH REFLEX TO HPV

## 2016-09-26 ENCOUNTER — Ambulatory Visit (INDEPENDENT_AMBULATORY_CARE_PROVIDER_SITE_OTHER): Payer: Medicare Other

## 2016-09-26 ENCOUNTER — Ambulatory Visit (INDEPENDENT_AMBULATORY_CARE_PROVIDER_SITE_OTHER): Payer: Medicare Other | Admitting: Obstetrics and Gynecology

## 2016-09-26 ENCOUNTER — Encounter: Payer: Self-pay | Admitting: Obstetrics and Gynecology

## 2016-09-26 VITALS — BP 122/70 | HR 68 | Resp 16 | Wt 220.0 lb

## 2016-09-26 DIAGNOSIS — R1032 Left lower quadrant pain: Secondary | ICD-10-CM | POA: Diagnosis not present

## 2016-09-26 DIAGNOSIS — A6000 Herpesviral infection of urogenital system, unspecified: Secondary | ICD-10-CM | POA: Diagnosis not present

## 2016-09-26 DIAGNOSIS — R1030 Lower abdominal pain, unspecified: Secondary | ICD-10-CM

## 2016-09-26 DIAGNOSIS — R102 Pelvic and perineal pain: Secondary | ICD-10-CM

## 2016-09-26 DIAGNOSIS — R198 Other specified symptoms and signs involving the digestive system and abdomen: Secondary | ICD-10-CM | POA: Diagnosis not present

## 2016-09-26 DIAGNOSIS — N939 Abnormal uterine and vaginal bleeding, unspecified: Secondary | ICD-10-CM

## 2016-09-26 DIAGNOSIS — Z90710 Acquired absence of both cervix and uterus: Secondary | ICD-10-CM | POA: Diagnosis not present

## 2016-09-26 NOTE — Progress Notes (Signed)
GYNECOLOGY  VISIT   HPI: 66 y.o.   Widowed  Caucasian  female   [phone] with No LMP recorded. Patient has had a hysterectomy.   here for   Pelvic ultrasound; patient has had a long h/o intermittent  The patient has had a small amount of staining on her underwear since her last visit. She denies blood or d/c when wiping. Her prior episode of bleeding prior to Thanksgiving was bright red.  Negative pap of her vaginal cuff, no blood in her urine on testing.  She has issues with diarrhea to constipation, she can have 1-8 BM in a day. The diarrhea occurs at least 1 x a week. Sometimes has pellets or normal BM's. She denies dietary changes. 1 day a week she can't leave the house secondary to diarrhea.  Colonoscopy was normal 2 years ago. Dr Ardis Hughs is her GI MD.  She c/o painful blisters on her buttocks off and on for 30 years. Just had one, seem to occur with stress. Can be on either side. Tolerable, she just uses nystatin ointment and it gets better.   GYNECOLOGIC HISTORY: No LMP recorded. Patient has had a hysterectomy. Contraception:hysterectomy Menopausal hormone therapy: none        OB History    Gravida Para Term Preterm AB Living   4 2 2   2 2    SAB TAB Ectopic Multiple Live Births           2         Patient Active Problem List   Diagnosis Date Noted  . Obesity (BMI 30-39.9) 07/20/2014  . Chronic insomnia 06/16/2014  . Hypokalemia 06/10/2014  . Volume overload 06/10/2014  . HCAP (healthcare-associated pneumonia) 06/10/2014  . UTI (urinary tract infection) 06/10/2014  . Leukocytosis 06/10/2014  . Acute respiratory failure with hypoxia ([place]) 06/10/2014  . Acute recurrent pancreatitis 06/06/2014  . Pancreatitis 06/06/2014  . Hypertension 09/11/2012  . Adjustment disorder with mixed anxiety and depressed mood 09/11/2012  . Pancreas divisum 01/18/2011  . URI 09/02/2010  . DEPRESSION 04/01/2010  . PANCREATITIS, HX OF 04/01/2010    Past Medical History:  Diagnosis  Date  . Abnormal uterine bleeding   . Amenorrhea   . Anxiety   . Depression   . Gallstones   . Hay fever   . HTN (hypertension)   . Pancreatitis   . Urine incontinence   . UTI (urinary tract infection)    reoccuring     Past Surgical History:  Procedure Laterality Date  . ABDOMINAL HYSTERECTOMY  1984   fibroids  . APPENDECTOMY  1984  . CHOLECYSTECTOMY  09/08/1999  . ERCP W/ SPHINCTEROTOMY AND BALLOON DILATION  08/2008  . OOPHORECTOMY     Only 1 was taken  . TONSILLECTOMY    . TUBAL LIGATION      Current Outpatient Prescriptions  Medication Sig Dispense Refill  . cetirizine (ZYRTEC) 10 MG tablet Take 10 mg by mouth daily as needed for allergies.     . famotidine (PEPCID) 20 MG tablet Take 20 mg by mouth daily.     . fluticasone (FLONASE) 50 MCG/ACT nasal spray Place 1 spray into both nostrils as needed for allergies or rhinitis. 48 g 3  . losartan (COZAAR) 100 MG tablet Take 1 tablet (100 mg total) by mouth daily. 90 tablet 3  . ondansetron (ZOFRAN ODT) 4 MG disintegrating tablet Take 1 tablet (4 mg total) by mouth every 8 (eight) hours as needed for nausea or vomiting.  20 tablet 0  . sertraline (ZOLOFT) 100 MG tablet Take 1 tablet (100 mg total) by mouth daily. 90 tablet 3  . zolpidem (AMBIEN) 5 MG tablet Take 1 tablet (5 mg total) by mouth at bedtime as needed for sleep. 30 tablet 1   No current facility-administered medications for this visit.      ALLERGIES: Buprenorphine hcl; Codeine; and Morphine and related  Family History  Problem Relation Age of Onset  . Alcohol abuse Mother   . Prostate cancer Father   . Stroke Maternal Aunt   . Hiatal hernia Maternal Aunt   . Prostate cancer Paternal Grandfather     Social History   Social History  . Marital status: Widowed    Spouse name: N/A  . Number of children: 2  . Years of education: N/A   Occupational History  . retired    Social History Main Topics  . Smoking status: Never Smoker  . Smokeless tobacco:  Never Used  . Alcohol use 0.0 oz/week     Comment: occ  . Drug use: No  . Sexual activity: No   Other Topics Concern  . Not on file   Social History Narrative  . No narrative on file    Review of Systems  Constitutional: Negative.   HENT: Negative.   Eyes: Negative.   Respiratory: Negative.   Cardiovascular: Negative.   Gastrointestinal: Positive for nausea.  Genitourinary:       Cramps   Musculoskeletal: Negative.   Skin: Negative.   Neurological: Negative.   Endo/Heme/Allergies: Negative.   Psychiatric/Behavioral: Negative.     PHYSICAL EXAMINATION:    BP 122/70 (BP Location: Right Arm, Patient Position: Sitting, Cuff Size: Normal)   Pulse 68   Resp 16   Wt 220 lb (99.8 kg)   BMI 41.23 kg/m     General appearance: alert, cooperative and appears stated age   Pelvic: External genitalia:  no lesions              Urethra:  normal appearing urethra with no masses, tenderness or lesions              Bartholins and Skenes: normal                 Vagina: normal appearing vagina with normal color and discharge, no lesions. Atrophic, appears slightly erythematous at the cuff today (s/p ultrasound). No lesions.               Cervix: absent              Bimanual Exam: no masses or tenderness. No palpable vaginal lesions  Right upper buttock: area of erythema, no current blisters or ulcers (she states that part has resolved).   Chaperone was present for exam.  Reviewed pelvic ultrasound images with the patient  ASSESSMENT Vaginal bleeding, no source found on 2 separate exams, negative pap, no blood in her urine. No cervix or uterus Abdominal pain LLQ, c/w GI source, absent left adnexa for 30+ years, normal right ovary Diarrhea to constipation, sounds c/w IBS Genital HSV, on her buttock, long term, never diagnosed, tolerable    PLAN Return with active bleeding for evaluation F/U with primary or GI for abdominal pain and bowel issues She will call if she desires a  retroviral treatment   An After Visit Summary was printed and given to the patient.  Over 15 minutes face to face time of which over 50% was spent in counseling.   CC:  Dr Elease Hashimoto, Dr Ardis Hughs (GI)

## 2016-10-02 ENCOUNTER — Telehealth: Payer: Self-pay | Admitting: Obstetrics and Gynecology

## 2016-10-02 NOTE — Telephone Encounter (Signed)
Patient had a severe reaction to the gel that was used for the sonohysterogram and want to make a note of it in her chart.

## 2016-10-02 NOTE — Telephone Encounter (Signed)
Surgilube -Sterile Surgical Lubricant is the same used during PUS and pelvic exams.

## 2016-10-02 NOTE — Telephone Encounter (Signed)
Spoke with patient. Patient states she had PUS 09/26/16 and states she had a reaction to gel used. Patient states she had had vaginal exams by Dr. Talbert Nan and pcp in past with no reactions from gel. Patient states all of her lower abdomen, pelvis and inside of thighs were like a really bad sunburn, swollen and itchy. Patient states swelling was gone by Saturday with just a few spots near belly button. Patient states she applied a cream and used benadryl for comfort. Patient denies any internal irritation. Patient would like allergies updated to reflect the gel used during PUS. Advised patient would need to review gel used and review with Dr. Talbert Nan and return call with any recommendations, patient is agreeable.  Dr. Talbert Nan -please advise?

## 2016-10-03 NOTE — Telephone Encounter (Signed)
Spoke with patient. Patient states feeling better each day, redness almost all gone. Advised patient we are still looking into gel, will update once we have more answers. Patient thankful for return call.   Cc: Linda Blackburn

## 2016-10-03 NOTE — Telephone Encounter (Signed)
Left message to call Linda Blackburn at [phone].  

## 2016-10-03 NOTE — Telephone Encounter (Signed)
It's not exactly the same product. The u/s gel is supposed to be hypoallergenic. Please let the patient know we are looking into it and will get back to her.

## 2016-10-04 NOTE — Telephone Encounter (Signed)
Gay Filler, any information about the gel and if we can use surgilube for exams? Thanks

## 2016-10-04 NOTE — Telephone Encounter (Signed)
Return call to patient. She reports she is "getting better every day" and is almost gone. . She still has a few itchy spots but they are gradually improving. She reports that this was only an external reaction on her lower abdomen and upper pubic area and thighs. Denies any vaginal irritation from internal exam.  Advised that ultrasonographer states she can bring lotion from home that she has not had any issues with if ever needs future abdominal scan. Patient again states she has not had any trouble with [place] gel from internal vaginal exam.  Advised will update allergies to include the Aquasonic ultrasound gel. Instructed to call back if symptoms do not completely resolve.  Routing to provider for final review. Patient agreeable to disposition. Will close encounter.

## 2016-10-23 ENCOUNTER — Telehealth: Payer: Self-pay | Admitting: Family Medicine

## 2016-10-23 NOTE — Telephone Encounter (Signed)
Looks like pt has had a Prevnar 13 and Pneumo 23 vaccine already. Okay for Fluconazone? Looks like she has had this on several occasions in the past. We do not have new shingles vaccine yet.

## 2016-10-23 NOTE — Telephone Encounter (Signed)
Pt would like to know if she needs to have the following vaccines new pneumonia and new shingle (shinglrix) and the insurance will pay for it. Would like to have it within the next month.  Pt need new Rx for fluconazole  [facility]

## 2016-10-23 NOTE — Telephone Encounter (Signed)
-  OK to refill Fluconazole once -She will not need any further pneumonia vaccines -she has had previous shingles vaccine and she will have to check with her insurance to see if they cover the new one

## 2016-10-24 MED ORDER — FLUCONAZOLE 150 MG PO TABS: 150.0000 mg | ORAL_TABLET | Freq: Once | ORAL | 0 refills | Status: AC

## 2016-10-24 NOTE — Telephone Encounter (Signed)
Medication called in for patient. She is aware via voicemail to call back in 2-4 weeks to see if we have the new shingles shot and then can schedule a lab appointment.

## 2017-01-03 DIAGNOSIS — H5203 Hypermetropia, bilateral: Secondary | ICD-10-CM | POA: Diagnosis not present

## 2017-01-03 DIAGNOSIS — H2513 Age-related nuclear cataract, bilateral: Secondary | ICD-10-CM | POA: Diagnosis not present

## 2017-03-19 ENCOUNTER — Other Ambulatory Visit: Payer: Self-pay | Admitting: Family Medicine

## 2017-04-09 ENCOUNTER — Telehealth: Payer: Self-pay | Admitting: Family Medicine

## 2017-04-09 ENCOUNTER — Other Ambulatory Visit: Payer: Self-pay | Admitting: Family Medicine

## 2017-04-09 DIAGNOSIS — Z1231 Encounter for screening mammogram for malignant neoplasm of breast: Secondary | ICD-10-CM

## 2017-04-09 MED ORDER — ONDANSETRON 4 MG PO TBDP: 4.0000 mg | ORAL_TABLET | Freq: Three times a day (TID) | ORAL | 0 refills | Status: DC | PRN

## 2017-04-09 NOTE — Telephone Encounter (Signed)
Refill okay?  

## 2017-04-09 NOTE — Telephone Encounter (Signed)
Last refill 08/07/16 and last office visit 08/11/16. Okay to fill?

## 2017-04-09 NOTE — Telephone Encounter (Signed)
Patient states that Pharmacy has not received the refill authorization for the below medication. Patient would like to have it refilled.  ondansetron (ZOFRAN ODT) 4 MG disintegrating tablet

## 2017-04-09 NOTE — Telephone Encounter (Signed)
Rx sent 

## 2017-04-11 ENCOUNTER — Other Ambulatory Visit: Payer: Self-pay | Admitting: Family Medicine

## 2017-05-23 ENCOUNTER — Ambulatory Visit
Admission: RE | Admit: 2017-05-23 | Discharge: 2017-05-23 | Disposition: A | Discharge status: Home / Self Care | Payer: Medicare Other | Source: Ambulatory Visit | Attending: Family Medicine | Admitting: Family Medicine

## 2017-05-23 DIAGNOSIS — Z1231 Encounter for screening mammogram for malignant neoplasm of breast: Secondary | ICD-10-CM | POA: Diagnosis not present

## 2017-05-24 ENCOUNTER — Encounter: Payer: Self-pay | Admitting: Family Medicine

## 2017-06-19 ENCOUNTER — Ambulatory Visit (INDEPENDENT_AMBULATORY_CARE_PROVIDER_SITE_OTHER): Payer: Medicare Other | Admitting: *Deleted

## 2017-06-19 DIAGNOSIS — Z23 Encounter for immunization: Secondary | ICD-10-CM | POA: Diagnosis not present

## 2017-06-20 ENCOUNTER — Ambulatory Visit (INDEPENDENT_AMBULATORY_CARE_PROVIDER_SITE_OTHER): Payer: Medicare Other | Admitting: Family Medicine

## 2017-06-20 ENCOUNTER — Ambulatory Visit: Payer: Medicare Other | Admitting: Family Medicine

## 2017-06-20 ENCOUNTER — Encounter: Payer: Self-pay | Admitting: Family Medicine

## 2017-06-20 VITALS — BP 130/80 | HR 92 | Temp 99.2°F | Wt 226.8 lb

## 2017-06-20 DIAGNOSIS — L821 Other seborrheic keratosis: Secondary | ICD-10-CM | POA: Diagnosis not present

## 2017-06-20 NOTE — Progress Notes (Signed)
Subjective:     Patient ID: Linda Blackburn, female   DOB: 22-May-1951, 66 y.o.   MRN: [id number]  HPI Pt here for irritated skin lesion left temporal area. Gets in way of her brushing hair.  Occasional itching and mild soreness.  Noted several months ago. No rapid changes in growth No hx of skin cancer.  Past Medical History:  Diagnosis Date  . Abnormal uterine bleeding   . Amenorrhea   . Anxiety   . Depression   . Gallstones   . Hay fever   . HTN (hypertension)   . Pancreatitis   . Urine incontinence   . UTI (urinary tract infection)    reoccuring    Past Surgical History:  Procedure Laterality Date  . ABDOMINAL HYSTERECTOMY  1984   fibroids  . APPENDECTOMY  1984  . CHOLECYSTECTOMY  09/08/1999  . ERCP W/ SPHINCTEROTOMY AND BALLOON DILATION  08/2008  . OOPHORECTOMY     Only 1 was taken  . TONSILLECTOMY    . TUBAL LIGATION      reports that she has never smoked. She has never used smokeless tobacco. She reports that she drinks alcohol. She reports that she does not use drugs. family history includes Alcohol abuse in her mother; Hiatal hernia in her maternal aunt; Prostate cancer in her father and paternal grandfather; Stroke in her maternal aunt. Allergies  Allergen Reactions  . Other Hives and Swelling    AQUASONIC [place] GEL  . Buprenorphine Hcl     "In a scarry movie" weird thoughts  . Codeine     GI upset  . Morphine And Related     "In a scarry movie" weird thoughts     Review of Systems  Constitutional: Negative for appetite change and unexpected weight change.  Hematological: Negative for adenopathy.       Objective:   Physical Exam  Constitutional: She appears well-developed and well-nourished.  Cardiovascular: Normal rate and regular rhythm.   Skin:  Raised well demarcated verrucous/scaly lesion left temporal region about 8 mm diameter.       Assessment:     Verrucous keratosis. Irritated secondary to location.    Plan:     Discussed risks and  benefits of rx with cryotherapy and pt wished to proceed. -Treated without difficulty -keep clean with soap and water. -follow up for any signs of secondary infection.  [name] MD [facility] at [facility]

## 2017-06-20 NOTE — Patient Instructions (Signed)
Keep skin lesion clean with soap and water Set up Medicare Annual Wellness Visit.

## 2017-08-09 NOTE — Progress Notes (Deleted)
Subjective:   Linda Blackburn is a 66 y.o. female who presents for Medicare Annual (Subsequent) preventive examination.  Diet HDL 45; Trig 125  Exercise   Patient concerns       Objective:    There are no preventive care reminders to display for this patient.  Vitals: There were no vitals taken for this visit.  There is no height or weight on file to calculate BMI.  Advanced Directives 03/20/2016 06/06/2014  Does Patient Have a Medical Advance Directive? No No  Would patient like information on creating a medical advance directive? - No - patient declined information    Tobacco Social History   Tobacco Use  Smoking Status Never Smoker  Smokeless Tobacco Never Used     Counseling given: Not Answered   Clinical Intake:      Past Medical History:  Diagnosis Date  . Abnormal uterine bleeding   . Amenorrhea   . Anxiety   . Depression   . Gallstones   . Hay fever   . HTN (hypertension)   . Pancreatitis   . Urine incontinence   . UTI (urinary tract infection)    reoccuring    Past Surgical History:  Procedure Laterality Date  . ABDOMINAL HYSTERECTOMY  1984   fibroids  . APPENDECTOMY  1984  . CHOLECYSTECTOMY  09/08/1999  . ERCP W/ SPHINCTEROTOMY AND BALLOON DILATION  08/2008  . OOPHORECTOMY     Only 1 was taken  . TONSILLECTOMY    . TUBAL LIGATION     Family History  Problem Relation Age of Onset  . Alcohol abuse Mother   . Prostate cancer Father   . Stroke Maternal Aunt   . Hiatal hernia Maternal Aunt   . Prostate cancer Paternal Grandfather   . Breast cancer Neg Hx    Social History   Socioeconomic History  . Marital status: Widowed    Spouse name: Not on file  . Number of children: 2  . Years of education: Not on file  . Highest education level: Not on file  Social Needs  . Financial resource strain: Not on file  . Food insecurity - worry: Not on file  . Food insecurity - inability: Not on file  . Transportation needs - medical: Not on file   . Transportation needs - non-medical: Not on file  Occupational History  . Occupation: retired  Tobacco Use  . Smoking status: Never Smoker  . Smokeless tobacco: Never Used  Substance and Sexual Activity  . Alcohol use: Yes    Alcohol/week: 0.0 oz    Comment: occ  . Drug use: No  . Sexual activity: No    Partners: Male    Birth control/protection: Surgical  Other Topics Concern  . Not on file  Social History Narrative  . Not on file    Outpatient Encounter Medications as of 08/10/2017  Medication Sig  . cetirizine (ZYRTEC) 10 MG tablet Take 10 mg by mouth daily as needed for allergies.   . famotidine (PEPCID) 20 MG tablet Take 20 mg by mouth daily.   . fluticasone (FLONASE) 50 MCG/ACT nasal spray Place 1 spray into both nostrils as needed for allergies or rhinitis.  Marland Kitchen losartan (COZAAR) 100 MG tablet Take 1 tablet (100 mg total) by mouth daily.  . ondansetron (ZOFRAN-ODT) 4 MG disintegrating tablet TAKE 1 TABLET BY MOUTH EVERY 8 HOURS AS NEEDED  . sertraline (ZOLOFT) 100 MG tablet Take 1 tablet (100 mg total) by mouth daily.  Marland Kitchen zolpidem (  AMBIEN) 5 MG tablet Take 1 tablet (5 mg total) by mouth at bedtime as needed for sleep.   No facility-administered encounter medications on file as of 08/10/2017.     Activities of Daily Living No flowsheet data found.  Timed Get Up and Go performed:   Patient Care Team: [name], MD as PCP - General    Assessment:     Exercise Activities and Dietary recommendations    Goals    None     Fall Risk Fall Risk  08/07/2016 07/22/2015 06/16/2014  Falls in the past year? No No No   Prevention of fall information given   Personal safety issues reviewed:  1. Consider starting a community watch program per [facility] 2.  Changes batteries is smoke detector and/or carbon monoxide detector  3.  If you have firearms; keep them in a safe place 4.  Wear protection when in the sun; Always wear sunscreen or a hat; It  is good to have your doctor check your skin annually or review any new areas of concern 5. Driving safety; Keep in the right lane; stay 3 car lengths behind the car in front of you on the highway; look 3 times prior to pulling out; carry your cell phone everywhere you go!     Depression Screen PHQ 2/9 Scores 08/07/2016 07/22/2015 06/16/2014  PHQ - 2 Score 0 0 0     Cognitive Function        Immunization History  Administered Date(s) Administered  . Influenza Split 06/28/2011, 07/10/2012  . Influenza, High Dose Seasonal PF 07/22/2015, 06/30/2016, 06/19/2017  . Influenza,inj,Quad PF,6+ Mos 07/16/2013, 06/16/2014  . Pneumococcal Conjugate-13 07/20/2014  . Pneumococcal Polysaccharide-23 07/22/2015  . Td 04/01/2010  . Zoster 08/14/2011   Screening Tests Health Maintenance  Topic Date Due  . MAMMOGRAM  05/24/2019  . COLONOSCOPY  11/03/2019  . TETANUS/TDAP  04/01/2020  . INFLUENZA VACCINE  Completed  . DEXA SCAN  Completed  . Hepatitis C Screening  Completed  . PNA vac Low Risk Adult  Completed   Cancer Screenings: Colonoscopy 11/2014  Mammogram 05/2017 Bone Density 09/2015  (-1.1) Pap no longer due    Additional Screenings: Hepatitis B/HIV/Syphillis: Hepatitis C Screening: 08/2016     Plan:    I have personally reviewed and noted the following in the patient's chart:   . Medical and social history . Use of alcohol, tobacco or illicit drugs  . Current medications and supplements . Functional ability and status . Nutritional status . Physical activity . Advanced directives . List of other physicians . Hospitalizations, surgeries, and ER visits in previous 12 months . Vitals . Screenings to include cognitive, depression, and falls . Referrals and appointments  In addition, I have reviewed and discussed with patient certain preventive protocols, quality metrics, and best practice recommendations. A written personalized care plan for preventive services as well  as general preventive health recommendations were provided to patient.     Wynetta Fines, RN  08/09/2017

## 2017-08-09 NOTE — Progress Notes (Deleted)
  PCP Notes   Health Maintenance   Abnormal Screens    Referrals    Patient concerns;   Nurse Concerns;   Next PCP apt

## 2017-08-10 ENCOUNTER — Ambulatory Visit (INDEPENDENT_AMBULATORY_CARE_PROVIDER_SITE_OTHER): Payer: Medicare Other | Admitting: Family Medicine

## 2017-08-10 ENCOUNTER — Encounter: Payer: Self-pay | Admitting: Family Medicine

## 2017-08-10 ENCOUNTER — Ambulatory Visit: Payer: Medicare Other

## 2017-08-10 VITALS — BP 128/80 | HR 78 | Ht 61.0 in | Wt 225.0 lb

## 2017-08-10 DIAGNOSIS — E669 Obesity, unspecified: Secondary | ICD-10-CM | POA: Diagnosis not present

## 2017-08-10 DIAGNOSIS — F339 Major depressive disorder, recurrent, unspecified: Secondary | ICD-10-CM

## 2017-08-10 DIAGNOSIS — I10 Essential (primary) hypertension: Secondary | ICD-10-CM | POA: Diagnosis not present

## 2017-08-10 DIAGNOSIS — L821 Other seborrheic keratosis: Secondary | ICD-10-CM | POA: Diagnosis not present

## 2017-08-10 DIAGNOSIS — Z0000 Encounter for general adult medical examination without abnormal findings: Secondary | ICD-10-CM

## 2017-08-10 DIAGNOSIS — Q453 Other congenital malformations of pancreas and pancreatic duct: Secondary | ICD-10-CM

## 2017-08-10 MED ORDER — SERTRALINE HCL 100 MG PO TABS
100.0000 mg | ORAL_TABLET | Freq: Every day | ORAL | 3 refills | Status: DC
Start: 2017-08-10 — End: 2018-08-19

## 2017-08-10 MED ORDER — FLUCONAZOLE 100 MG PO TABS: 100.0000 mg | ORAL_TABLET | Freq: Every day | ORAL | 0 refills | Status: DC

## 2017-08-10 MED ORDER — LOSARTAN POTASSIUM 100 MG PO TABS: 100.0000 mg | ORAL_TABLET | Freq: Every day | ORAL | 3 refills | Status: DC

## 2017-08-10 MED ORDER — ONDANSETRON 4 MG PO TBDP: 4.0000 mg | ORAL_TABLET | Freq: Three times a day (TID) | ORAL | 1 refills | Status: DC | PRN

## 2017-08-10 MED ORDER — OXYCODONE HCL 5 MG PO TABS: 5.0000 mg | ORAL_TABLET | ORAL | 0 refills | Status: DC | PRN

## 2017-08-10 MED ORDER — TRIAMCINOLONE ACETONIDE 0.1 % EX CREA: 1.0000 "application " | TOPICAL_CREAM | Freq: Two times a day (BID) | CUTANEOUS | 1 refills | Status: DC

## 2017-08-10 NOTE — Patient Instructions (Addendum)
Linda Blackburn , Thank you for taking time to come for your Medicare Wellness Visit. I appreciate your ongoing commitment to your health goals. Please review the following plan we discussed and let me know if I can assist you in the future.   Recommendations for Dexa Scan Female over the age of 76 Man age 69 or older If you broke a bone past the age of 89 Women menopausal age with risk factors (thin frame; smoker; hx of fx ) Post menopausal women under the age of 54 with risk factors A man age 31 to 34 with risk factors Other: Spine xray that is showing break of bone loss Back pain with possible break Height loss of 1/2 inch or more within one year Total loss in height of 1.5 inches from your original height  Calcium 1230m with Vit D 800u per day; more as directed by physician Strength building exercises discussed; can include walking; housework; small weights or stretch bands; silver sneakers if access to the [facility]  Please visit the osteoporosis foundation.org for up to date recommendations  YOU had the zoster vaccine for shingles 2012- will check with [employer] Shingrix is a vaccine for the prevention of Shingles in Adults 50 and older.  If you are on Medicare, you can request a prescription from your doctor to be filled at a pharmacy.  Please check with your benefits regarding applicable copays or out of pocket expenses.  The Shingrix is given in 2 vaccines approx 8 weeks apart. You must receive the 2nd dose prior to 6 months from receipt of the first.     These are the goals we discussed: Goals    . patient     Will continue on your journey   May want to check [phone]       This is a list of the screening recommended for you and due dates:  Health Maintenance  Topic Date Due  . Mammogram  05/24/2019  . Colon Cancer Screening  11/03/2019  . Tetanus Vaccine  04/01/2020  . Flu Shot   Completed  . DEXA scan (bone density measurement)  Completed  .  Hepatitis C: One time screening is recommended by Center for Disease Control  (CDC) for  adults born from 18through 1965.   Completed  . Pneumonia vaccines  Completed   Health Maintenance, Female Adopting a healthy lifestyle and getting preventive care can go a long way to promote health and wellness. Talk with your health care provider about what schedule of regular examinations is right for you. This is a good chance for you to check in with your provider about disease prevention and staying healthy. In between checkups, there are plenty of things you can do on your own. Experts have done a lot of research about which lifestyle changes and preventive measures are most likely to keep you healthy. Ask your health care provider for more information. Weight and diet Eat a healthy diet  Be sure to include plenty of vegetables, fruits, low-fat dairy products, and lean protein.  Do not eat a lot of foods high in solid fats, added sugars, or salt.  Get regular exercise. This is one of the most important things you can do for your health. ? Most adults should exercise for at least 150 minutes each week. The exercise should increase your heart rate and make you  sweat (moderate-intensity exercise). ? Most adults should also do strengthening exercises at least twice a week. This is in addition to the moderate-intensity exercise.  Maintain a healthy weight  Body mass index (BMI) is a measurement that can be used to identify possible weight problems. It estimates body fat based on height and weight. Your health care provider can help determine your BMI and help you achieve or maintain a healthy weight.  For females 29 years of age and older: ? A BMI below 18.5 is considered underweight. ? A BMI of 18.5 to 24.9 is normal. ? A BMI of 25 to 29.9 is considered overweight. ? A BMI of 30 and above is considered obese.  Watch levels of  cholesterol and blood lipids  You should start having your blood tested for lipids and cholesterol at 68 years of age, then have this test every 5 years.  You may need to have your cholesterol levels checked more often if: ? Your lipid or cholesterol levels are high. ? You are older than 68 years of age. ? You are at high risk for heart disease.  Cancer screening Lung Cancer  Lung cancer screening is recommended for adults 74-50 years old who are at high risk for lung cancer because of a history of smoking.  A yearly low-dose CT scan of the lungs is recommended for people who: ? Currently smoke. ? Have quit within the past 15 years. ? Have at least a 30-pack-year history of smoking. A pack year is smoking an average of one pack of cigarettes a day for 1 year.  Yearly screening should continue until it has been 15 years since you quit.  Yearly screening should stop if you develop a health problem that would prevent you from having lung cancer treatment.  Breast Cancer  Practice breast self-awareness. This means understanding how your breasts normally appear and feel.  It also means doing regular breast self-exams. Let your health care provider know about any changes, no matter how small.  If you are in your 20s or 30s, you should have a clinical breast exam (CBE) by a health care provider every 1-3 years as part of a regular health exam.  If you are 30 or older, have a CBE every year. Also consider having a breast X-ray (mammogram) every year.  If you have a family history of breast cancer, talk to your health care provider about genetic screening.  If you are at high risk for breast cancer, talk to your health care provider about having an MRI and a mammogram every year.  Breast cancer gene (BRCA) assessment is recommended for women who have family members with BRCA-related cancers. BRCA-related cancers include: ? Breast. ? Ovarian. ? Tubal. ? Peritoneal cancers.  Results  of the assessment will determine the need for genetic counseling and BRCA1 and BRCA2 testing.  Cervical Cancer Your health care provider may recommend that you be screened regularly for cancer of the pelvic organs (ovaries, uterus, and vagina). This screening involves a pelvic examination, including checking for microscopic changes to the surface of your cervix (Pap test). You may be encouraged to have this screening done every 3 years, beginning at age 36.  For women ages 4-65, health care providers may recommend pelvic exams and Pap testing every 3 years, or they may recommend the Pap and pelvic exam, combined with testing for human papilloma virus (HPV), every 5 years. Some types of HPV increase your risk of cervical cancer. Testing for HPV may also  be done on women of any age with unclear Pap test results.  Other health care providers may not recommend any screening for nonpregnant women who are considered low risk for pelvic cancer and who do not have symptoms. Ask your health care provider if a screening pelvic exam is right for you.  If you have had past treatment for cervical cancer or a condition that could lead to cancer, you need Pap tests and screening for cancer for at least 20 years after your treatment. If Pap tests have been discontinued, your risk factors (such as having a new sexual partner) need to be reassessed to determine if screening should resume. Some women have medical problems that increase the chance of getting cervical cancer. In these cases, your health care provider may recommend more frequent screening and Pap tests.  Colorectal Cancer  This type of cancer can be detected and often prevented.  Routine colorectal cancer screening usually begins at 68 years of age and continues through 68 years of age.  Your health care provider may recommend screening at an earlier age if you have risk factors for colon cancer.  Your health care provider may also recommend using  home test kits to check for hidden blood in the stool.  A small camera at the end of a tube can be used to examine your colon directly (sigmoidoscopy or colonoscopy). This is done to check for the earliest forms of colorectal cancer.  Routine screening usually begins at age 69.  Direct examination of the colon should be repeated every 5-10 years through 68 years of age. However, you may need to be screened more often if early forms of precancerous polyps or small growths are found.  Skin Cancer  Check your skin from head to toe regularly.  Tell your health care provider about any new moles or changes in moles, especially if there is a change in a mole's shape or color.  Also tell your health care provider if you have a mole that is larger than the size of a pencil eraser.  Always use sunscreen. Apply sunscreen liberally and repeatedly throughout the day.  Protect yourself by wearing long sleeves, pants, a wide-brimmed hat, and sunglasses whenever you are outside.  Heart disease, diabetes, and high blood pressure  High blood pressure causes heart disease and increases the risk of stroke. High blood pressure is more likely to develop in: ? People who have blood pressure in the high end of the normal range (130-139/85-89 mm Hg). ? People who are overweight or obese. ? People who are African American.  If you are 66-73 years of age, have your blood pressure checked every 3-5 years. If you are 22 years of age or older, have your blood pressure checked every year. You should have your blood pressure measured twice-once when you are at a hospital or clinic, and once when you are not at a hospital or clinic. Record the average of the two measurements. To check your blood pressure when you are not at a hospital or clinic, you can use: ? An automated blood pressure machine at a pharmacy. ? A home blood pressure monitor.  If you are between 86 years and 37 years old, ask your health care provider  if you should take aspirin to prevent strokes.  Have regular diabetes screenings. This involves taking a blood sample to check your fasting blood sugar level. ? If you are at a normal weight and have a low risk for diabetes, have this  test once every three years after 68 years of age. ? If you are overweight and have a high risk for diabetes, consider being tested at a younger age or more often. Preventing infection Hepatitis B  If you have a higher risk for hepatitis B, you should be screened for this virus. You are considered at high risk for hepatitis B if: ? You were born in a country where hepatitis B is common. Ask your health care provider which countries are considered high risk. ? Your parents were born in a high-risk country, and you have not been immunized against hepatitis B (hepatitis B vaccine). ? You have HIV or AIDS. ? You use needles to inject street drugs. ? You live with someone who has hepatitis B. ? You have had sex with someone who has hepatitis B. ? You get hemodialysis treatment. ? You take certain medicines for conditions, including cancer, organ transplantation, and autoimmune conditions.  Hepatitis C  Blood testing is recommended for: ? Everyone born from [birth year] through 1965. ? Anyone with known risk factors for hepatitis C.  Sexually transmitted infections (STIs)  You should be screened for sexually transmitted infections (STIs) including gonorrhea and chlamydia if: ? You are sexually active and are younger than 68 years of age. ? You are older than 68 years of age and your health care provider tells you that you are at risk for this type of infection. ? Your sexual activity has changed since you were last screened and you are at an increased risk for chlamydia or gonorrhea. Ask your health care provider if you are at risk.  If you do not have HIV, but are at risk, it may be recommended that you take a prescription medicine daily to prevent HIV infection. This  is called pre-exposure prophylaxis (PrEP). You are considered at risk if: ? You are sexually active and do not regularly use condoms or know the HIV status of your partner(s). ? You take drugs by injection. ? You are sexually active with a partner who has HIV.  Talk with your health care provider about whether you are at high risk of being infected with HIV. If you choose to begin PrEP, you should first be tested for HIV. You should then be tested every 3 months for as long as you are taking PrEP. Pregnancy  If you are premenopausal and you may become pregnant, ask your health care provider about preconception counseling.  If you may become pregnant, take 400 to 800 micrograms (mcg) of folic acid every day.  If you want to prevent pregnancy, talk to your health care provider about birth control (contraception). Osteoporosis and menopause  Osteoporosis is a disease in which the bones lose minerals and strength with aging. This can result in serious bone fractures. Your risk for osteoporosis can be identified using a bone density scan.  If you are 64 years of age or older, or if you are at risk for osteoporosis and fractures, ask your health care provider if you should be screened.  Ask your health care provider whether you should take a calcium or vitamin D supplement to lower your risk for osteoporosis.  Menopause may have certain physical symptoms and risks.  Hormone replacement therapy may reduce some of these symptoms and risks. Talk to your health care provider about whether hormone replacement therapy is right for you. Follow these instructions at home:  Schedule regular health, dental, and eye exams.  Stay current with your immunizations.  Do not use  any tobacco products including cigarettes, chewing tobacco, or electronic cigarettes.  If you are pregnant, do not drink alcohol.  If you are breastfeeding, limit how much and how often you drink alcohol.  Limit alcohol intake  to no more than 1 drink per day for nonpregnant women. One drink equals 12 ounces of beer, 5 ounces of wine, or 1 ounces of hard liquor.  Do not use street drugs.  Do not share needles.  Ask your health care provider for help if you need support or information about quitting drugs.  Tell your health care provider if you often feel depressed.  Tell your health care provider if you have ever been abused or do not feel safe at home. This information is not intended to replace advice given to you by your health care provider. Make sure you discuss any questions you have with your health care provider. Document Released: 03/06/2011 Document Revised: 01/27/2016 Document Reviewed: 05/25/2015 Elsevier Interactive Patient Education  2018 [place] in the Home Falls can cause injuries and can affect people from all age groups. There are many simple things that you can do to make your home safe and to help prevent falls. What can I do on the outside of my home?  Regularly repair the edges of walkways and driveways and fix any cracks.  Remove high doorway thresholds.  Trim any shrubbery on the main path into your home.  Use bright outdoor lighting.  Clear walkways of debris and clutter, including tools and rocks.  Regularly check that handrails are securely fastened and in good repair. Both sides of any steps should have handrails.  Install guardrails along the edges of any raised decks or porches.  Have leaves, snow, and ice cleared regularly.  Use sand or salt on walkways during winter months.  In the garage, clean up any spills right away, including grease or oil spills. What can I do in the bathroom?  Use night lights.  Install grab bars by the toilet and in the tub and shower. Do not use towel bars as grab bars.  Use non-skid mats or decals on the floor of the tub or shower.  If you need to sit down while you are in the shower, use a plastic, non-slip  stool.  Keep the floor dry. Immediately clean up any water that spills on the floor.  Remove soap buildup in the tub or shower on a regular basis.  Attach bath mats securely with double-sided non-slip rug tape.  Remove throw rugs and other tripping hazards from the floor. What can I do in the bedroom?  Use night lights.  Make sure that a bedside light is easy to reach.  Do not use oversized bedding that drapes onto the floor.  Have a firm chair that has side arms to use for getting dressed.  Remove throw rugs and other tripping hazards from the floor. What can I do in the kitchen?  Clean up any spills right away.  Avoid walking on wet floors.  Place frequently used items in easy-to-reach places.  If you need to reach for something above you, use a sturdy step stool that has a grab bar.  Keep electrical cables out of the way.  Do not use floor polish or wax that makes floors slippery. If you have to use wax, make sure that it is non-skid floor wax.  Remove throw rugs and other tripping hazards from the floor. What can I do in the  stairways?  Do not leave any items on the stairs.  Make sure that there are handrails on both sides of the stairs. Fix handrails that are broken or loose. Make sure that handrails are as long as the stairways.  Check any carpeting to make sure that it is firmly attached to the stairs. Fix any carpet that is loose or worn.  Avoid having throw rugs at the top or bottom of stairways, or secure the rugs with carpet tape to prevent them from moving.  Make sure that you have a light switch at the top of the stairs and the bottom of the stairs. If you do not have them, have them installed. What are some other fall prevention tips?  Wear closed-toe shoes that fit well and support your feet. Wear shoes that have rubber soles or low heels.  When you use a stepladder, make sure that it is completely opened and that the sides are firmly locked. Have  someone hold the ladder while you are using it. Do not climb a closed stepladder.  Add color or contrast paint or tape to grab bars and handrails in your home. Place contrasting color strips on the first and last steps.  Use mobility aids as needed, such as canes, walkers, scooters, and crutches.  Turn on lights if it is dark. Replace any light bulbs that burn out.  Set up furniture so that there are clear paths. Keep the furniture in the same spot.  Fix any uneven floor surfaces.  Choose a carpet design that does not hide the edge of steps of a stairway.  Be aware of any and all pets.  Review your medicines with your healthcare provider. Some medicines can cause dizziness or changes in blood pressure, which increase your risk of falling. Talk with your health care provider about other ways that you can decrease your risk of falls. This may include working with a physical therapist or trainer to improve your strength, balance, and endurance. This information is not intended to replace advice given to you by your health care provider. Make sure you discuss any questions you have with your health care provider. Document Released: 08/11/2002 Document Revised: 01/18/2016 Document Reviewed: 09/25/2014 Elsevier Interactive Patient Education  2017 [employer].

## 2017-08-10 NOTE — Progress Notes (Signed)
Subjective:     Patient ID: Linda Blackburn, female   DOB: Aug 12, 1951, 66 y.o.   MRN: [id number]  HPI Patient here for Medicare wellness visit and medical follow-up. Chronic problems include history of obesity, hypertension, pancreatitis-recurrent with history of diagnosed pancreas devisum, chronic insomnia, and history of recurrent depression.  She has several issues today. We addressed the following:  Discuss new shingles vaccine. She will like to proceed with getting this.  No recent flareup of pancreatitis but she likes to have a few tablets of oxycodone 5 mg on hand in case she has flareup. We agreed to refill #12.  Skin lesion left temporal area. Verrucous keratosis. Treated with liquid nitrogen several months ago and reduced in size but not resolved. She will like to have this retreated today.  Requesting refills of several medications today including losartan and sertraline. Her depression is stable. Blood pressures been stable on losartan. No headaches or dizziness.  Past Medical History:  Diagnosis Date  . Abnormal uterine bleeding   . Amenorrhea   . Anxiety   . Depression   . Gallstones   . Hay fever   . HTN (hypertension)   . Pancreatitis   . Urine incontinence   . UTI (urinary tract infection)    reoccuring    Past Surgical History:  Procedure Laterality Date  . ABDOMINAL HYSTERECTOMY  1984   fibroids  . APPENDECTOMY  1984  . CHOLECYSTECTOMY  09/08/1999  . ERCP W/ SPHINCTEROTOMY AND BALLOON DILATION  08/2008  . OOPHORECTOMY     Only 1 was taken  . TONSILLECTOMY    . TUBAL LIGATION      reports that  has never smoked. she has never used smokeless tobacco. She reports that she drinks alcohol. She reports that she does not use drugs. family history includes Alcohol abuse in her mother; Hiatal hernia in her maternal aunt; Prostate cancer in her father and paternal grandfather; Stroke in her maternal aunt. Allergies  Allergen Reactions  . Other Hives and Swelling   AQUASONIC [place] GEL  . Buprenorphine Hcl     "In a scarry movie" weird thoughts  . Codeine     GI upset  . Morphine And Related     "In a scarry movie" weird thoughts      Review of Systems  Constitutional: Negative for fever.  Eyes: Negative for visual disturbance.  Respiratory: Negative for cough, chest tightness, shortness of breath and wheezing.   Cardiovascular: Negative for chest pain, palpitations and leg swelling.  Neurological: Negative for dizziness, seizures, syncope, weakness, light-headedness and headaches.       Objective:   Physical Exam  Constitutional: She appears well-developed and well-nourished.  Eyes: Pupils are equal, round, and reactive to light.  Neck: Neck supple. No JVD present. No thyromegaly present.  Cardiovascular: Normal rate and regular rhythm. Exam reveals no gallop.  Pulmonary/Chest: Effort normal and breath sounds normal. No respiratory distress. She has no wheezes. She has no rales.  Musculoskeletal: She exhibits no edema.  Neurological: She is alert.  Skin:  Patient has thickened whitish hyperkeratotic somewhat verrucous appearing lesion left temporal area       Assessment:     #1 hypertension stable  #2 obesity with recent increased weight gain  #3 history of recurrent depression currently stable on sertraline 100 mg daily  #4 verrucous keratosis left temporal area  #5 history of recurrent pancreatitis    Plan:     -Refill losartan and sertraline for one year -Discussed risk  and benefits of liquid nitrogen therapy to the left temporal area including risks of pain, blistering, infection, and patient consented. This was treated without difficulty -We will check on whether she can get her shingles vaccine here based on her insurance coverage -Encouraged to lose some weight and discussed strategies -Agreed to limited oxycodone 5 mg #12 tablets to use 1 every 4-6 hours for severe recurrent pain  [name] MD [facility]

## 2017-08-10 NOTE — Progress Notes (Signed)
Subjective:   Linda Blackburn is a 66 y.o. female who presents for Medicare Annual (Subsequent) preventive examination.  Spouse died x 7.5 years ago memorial day Happened very quickly Still adjusting; was dx very late;  Son then left in October;  Had wonderful friends at Hospice  Has a son in [place] - drove herself Other son lives in [place] - one bedroom  Hesitant to move at this time Mom died at 20 yo and father died after spouse   Diet Tries to eat well; fresh vegetables and  Eats when she is hungry Breakfast cinnamon raisin bread Tuna salad Does not go out to eat very much   BMI 42.5  [facility] regarding [facility]  Agreed to fup on orientation / introduction class   Exercise May start exercising at the [facility] is good but does have episodes of pancreatitis  Was hospitalized x 66 yo Oct and then last July 4th she treated herself at home;  Dr. Ardis Hughs, [facility]  Cardiac Risk Factors include: advanced age (>59men, >24 women);family history of premature cardiovascular disease;hypertension;obesity (BMI >30kg/m2)     Objective:     Vitals: BP 128/80   Pulse 78   Ht 5\' 1"  (1.549 m)   Wt 225 lb (102.1 kg)   SpO2 97%   BMI 42.51 kg/m   Body mass index is 42.51 kg/m.  Advanced Directives 08/10/2017 03/20/2016 06/06/2014  Does Patient Have a Medical Advance Directive? Yes No No  Would patient like information on creating a medical advance directive? - - No - patient declined information    Tobacco Social History   Tobacco Use  Smoking Status Never Smoker  Smokeless Tobacco Never Used     Counseling given: Yes        Past Medical History:  Diagnosis Date  . Abnormal uterine bleeding   . Amenorrhea   . Anxiety   . Depression   . Gallstones   . Hay fever   . HTN (hypertension)   . Pancreatitis   . Urine incontinence   . UTI (urinary tract infection)    reoccuring    Past Surgical History:  Procedure  Laterality Date  . ABDOMINAL HYSTERECTOMY  1984   fibroids  . APPENDECTOMY  1984  . CHOLECYSTECTOMY  09/08/1999  . ERCP W/ SPHINCTEROTOMY AND BALLOON DILATION  08/2008  . OOPHORECTOMY     Only 1 was taken  . TONSILLECTOMY    . TUBAL LIGATION     Family History  Problem Relation Age of Onset  . Alcohol abuse Mother   . Prostate cancer Father   . Stroke Maternal Aunt   . Hiatal hernia Maternal Aunt   . Prostate cancer Paternal Grandfather   . Breast cancer Neg Hx    Social History   Socioeconomic History  . Marital status: Widowed    Spouse name: Not on file  . Number of children: 2  . Years of education: Not on file  . Highest education level: Not on file  Social Needs  . Financial resource strain: Not on file  . Food insecurity - worry: Not on file  . Food insecurity - inability: Not on file  . Transportation needs - medical: Not on file  . Transportation needs - non-medical: Not on file  Occupational History  . Occupation: retired  Tobacco Use  . Smoking status: Never Smoker  . Smokeless tobacco: Never Used  Substance and Sexual  Activity  . Alcohol use: Yes    Alcohol/week: 0.0 oz    Comment: occ - hardly every   . Drug use: No  . Sexual activity: No    Partners: Male    Birth control/protection: Surgical  Other Topics Concern  . Not on file  Social History Narrative  . Not on file    Outpatient Encounter Medications as of 08/10/2017  Medication Sig  . cetirizine (ZYRTEC) 10 MG tablet Take 10 mg by mouth daily as needed for allergies.   . famotidine (PEPCID) 20 MG tablet Take 20 mg by mouth daily.   Marland Kitchen losartan (COZAAR) 100 MG tablet Take 1 tablet (100 mg total) by mouth daily.  . ondansetron (ZOFRAN-ODT) 4 MG disintegrating tablet TAKE 1 TABLET BY MOUTH EVERY 8 HOURS AS NEEDED  . sertraline (ZOLOFT) 100 MG tablet Take 1 tablet (100 mg total) by mouth daily.  Marland Kitchen zolpidem (AMBIEN) 5 MG tablet Take 1 tablet (5 mg total) by mouth at bedtime as needed for sleep.    . fluticasone (FLONASE) 50 MCG/ACT nasal spray Place 1 spray into both nostrils as needed for allergies or rhinitis. (Patient not taking: Reported on 08/10/2017)   No facility-administered encounter medications on file as of 08/10/2017.     Activities of Daily Living In your present state of health, do you have any difficulty performing the following activities: 08/10/2017  Hearing? N  Vision? N  Difficulty concentrating or making decisions? N  Walking or climbing stairs? N  Dressing or bathing? N  Doing errands, shopping? N  Preparing Food and eating ? N  Using the Toilet? N  In the past six months, have you accidently leaked urine? Y  Comment bladder has gotten worse   Do you have problems with loss of bowel control? N  Managing your Medications? N  Managing your Finances? N  Housekeeping or managing your Housekeeping? N  Some recent data might be hidden    Timed Get Up and Go performed: WNL   Patient Care Team: [name], MD as PCP - General  Dr. Ardis Hughs; Dr. Sumner Boast; has some vaginal bleeding in January;  Hx of TAH and bladder prolapse      Assessment:     Exercise Activities and Dietary recommendations Does not exercise for now  Current Exercise Habits: Home exercise routine  Goals    . patient     Will continue on your journey   May want to check out Dr. Dennard Nip at the [facility] [phone] and is located at [place]  08/10/2017 08/07/2016 07/22/2015 06/16/2014  Falls in the past year? No No No No   Prevention of falls: Remove rugs or any tripping hazards in the home Use Non slip mats in bathtubs and showers Placing grab bars next to the toilet and or shower Placing handrails on both sides of the stair way Adding extra lighting in the home.   Personal safety issues reviewed:  1. Consider starting a community watch program per [facility] 2.  Changes batteries is smoke detector and/or carbon  monoxide detector  3.  If you have firearms; keep them in a safe place 4.  Wear protection when in the sun; Always wear sunscreen or a hat; It is good to have your doctor check your skin annually or review any new areas of concern 5. Driving safety; Keep in the right lane; stay 3 car lengths behind the  car in front of you on the highway; look 3 times prior to pulling out; carry your cell phone everywhere you go!     Depression Screen PHQ 2/9 Scores 08/10/2017 08/07/2016 07/22/2015 06/16/2014  PHQ - 2 Score 0 0 0 0     Cognitive Function     Ad8 score reviewed for issues:  Issues making decisions:  Less interest in hobbies / activities:  Repeats questions, stories (family complaining):  Trouble using ordinary gadgets (microwave, computer, phone):  Forgets the month or year:   Mismanaging finances:   Remembering appts:  Daily problems with thinking and/or memory: Ad8 score is=0       Immunization History  Administered Date(s) Administered  . Influenza Split 06/28/2011, 07/10/2012  . Influenza, High Dose Seasonal PF 07/22/2015, 06/30/2016, 06/19/2017  . Influenza,inj,Quad PF,6+ Mos 07/16/2013, 06/16/2014  . Pneumococcal Conjugate-13 07/20/2014  . Pneumococcal Polysaccharide-23 07/22/2015  . Td 04/01/2010  . Zoster 08/14/2011   Screening Tests Health Maintenance  Topic Date Due  . MAMMOGRAM  05/24/2019  . COLONOSCOPY  11/03/2019  . TETANUS/TDAP  04/01/2020  . INFLUENZA VACCINE  Completed  . DEXA SCAN  Completed  . Hepatitis C Screening  Completed  . PNA vac Low Risk Adult  Completed   Cancer Screenings: Colonoscopy 11/2014; will recheck in 5 years  Mammogram 05/2017 Dexa: January 2017;  -1.1       Plan:     PCP Notes   Health Maintenance There are no preventive care reminders to display for this patient. Educated regarding the shingrix  Abnormal Screens  Educated regarding dexa and osteopenia Does not exercise currently but may start if she decides  to lose weight  Referrals  None X fup with Dr. Leafy Ro if she would like to go   Patient concerns; 1. Discuss pepcid or other med 2. Discuss prolapse bladder 3 set goal to lose weight and recommended fup with Dr. Leafy Ro for intro orientation   Nurse Concerns; As noted  Next PCP apt today    I have personally reviewed and noted the following in the patient's chart:   . Medical and social history . Use of alcohol, tobacco or illicit drugs  . Current medications and supplements . Functional ability and status . Nutritional status . Physical activity . Advanced directives . List of other physicians . Hospitalizations, surgeries, and ER visits in previous 12 months . Vitals . Screenings to include cognitive, depression, and falls . Referrals and appointments  In addition, I have reviewed and discussed with patient certain preventive protocols, quality metrics, and best practice recommendations. A written personalized care plan for preventive services as well as general preventive health recommendations were provided to patient.     TJQZE,SPQZR, RN  08/10/2017  Agree with assessment as above.  [name] MD [facility]

## 2018-01-01 ENCOUNTER — Other Ambulatory Visit: Payer: Self-pay

## 2018-01-01 ENCOUNTER — Emergency Department (HOSPITAL_COMMUNITY): Payer: Medicare Other

## 2018-01-01 ENCOUNTER — Encounter (HOSPITAL_COMMUNITY): Payer: Self-pay

## 2018-01-01 ENCOUNTER — Inpatient Hospital Stay (HOSPITAL_COMMUNITY)
Admission: EM | Admit: 2018-01-01 | Discharge: 2018-01-05 | DRG: 871 | Disposition: A | Discharge status: Skilled Nursing Facility | Payer: Medicare Other | Attending: Family Medicine | Admitting: Family Medicine

## 2018-01-01 DIAGNOSIS — R404 Transient alteration of awareness: Secondary | ICD-10-CM | POA: Diagnosis not present

## 2018-01-01 DIAGNOSIS — J189 Pneumonia, unspecified organism: Secondary | ICD-10-CM | POA: Diagnosis present

## 2018-01-01 DIAGNOSIS — Z743 Need for continuous supervision: Secondary | ICD-10-CM | POA: Diagnosis not present

## 2018-01-01 DIAGNOSIS — Z9089 Acquired absence of other organs: Secondary | ICD-10-CM | POA: Diagnosis not present

## 2018-01-01 DIAGNOSIS — Z811 Family history of alcohol abuse and dependence: Secondary | ICD-10-CM | POA: Diagnosis not present

## 2018-01-01 DIAGNOSIS — F329 Major depressive disorder, single episode, unspecified: Secondary | ICD-10-CM | POA: Diagnosis present

## 2018-01-01 DIAGNOSIS — N179 Acute kidney failure, unspecified: Secondary | ICD-10-CM

## 2018-01-01 DIAGNOSIS — Z885 Allergy status to narcotic agent status: Secondary | ICD-10-CM | POA: Diagnosis not present

## 2018-01-01 DIAGNOSIS — Z90710 Acquired absence of both cervix and uterus: Secondary | ICD-10-CM

## 2018-01-01 DIAGNOSIS — R531 Weakness: Secondary | ICD-10-CM | POA: Diagnosis not present

## 2018-01-01 DIAGNOSIS — F339 Major depressive disorder, recurrent, unspecified: Secondary | ICD-10-CM | POA: Diagnosis present

## 2018-01-01 DIAGNOSIS — Z823 Family history of stroke: Secondary | ICD-10-CM | POA: Diagnosis not present

## 2018-01-01 DIAGNOSIS — B379 Candidiasis, unspecified: Secondary | ICD-10-CM | POA: Diagnosis not present

## 2018-01-01 DIAGNOSIS — R262 Difficulty in walking, not elsewhere classified: Secondary | ICD-10-CM | POA: Diagnosis not present

## 2018-01-01 DIAGNOSIS — Z9851 Tubal ligation status: Secondary | ICD-10-CM | POA: Diagnosis not present

## 2018-01-01 DIAGNOSIS — K859 Acute pancreatitis without necrosis or infection, unspecified: Secondary | ICD-10-CM | POA: Diagnosis not present

## 2018-01-01 DIAGNOSIS — M542 Cervicalgia: Secondary | ICD-10-CM | POA: Diagnosis not present

## 2018-01-01 DIAGNOSIS — Z66 Do not resuscitate: Secondary | ICD-10-CM | POA: Diagnosis present

## 2018-01-01 DIAGNOSIS — Z8042 Family history of malignant neoplasm of prostate: Secondary | ICD-10-CM | POA: Diagnosis not present

## 2018-01-01 DIAGNOSIS — K861 Other chronic pancreatitis: Secondary | ICD-10-CM | POA: Diagnosis not present

## 2018-01-01 DIAGNOSIS — S069X9A Unspecified intracranial injury with loss of consciousness of unspecified duration, initial encounter: Secondary | ICD-10-CM | POA: Diagnosis not present

## 2018-01-01 DIAGNOSIS — R279 Unspecified lack of coordination: Secondary | ICD-10-CM | POA: Diagnosis not present

## 2018-01-01 DIAGNOSIS — Z90721 Acquired absence of ovaries, unilateral: Secondary | ICD-10-CM | POA: Diagnosis not present

## 2018-01-01 DIAGNOSIS — L304 Erythema intertrigo: Secondary | ICD-10-CM | POA: Diagnosis present

## 2018-01-01 DIAGNOSIS — M6281 Muscle weakness (generalized): Secondary | ICD-10-CM | POA: Diagnosis not present

## 2018-01-01 DIAGNOSIS — A419 Sepsis, unspecified organism: Secondary | ICD-10-CM | POA: Diagnosis not present

## 2018-01-01 DIAGNOSIS — R6520 Severe sepsis without septic shock: Secondary | ICD-10-CM | POA: Diagnosis present

## 2018-01-01 DIAGNOSIS — F5101 Primary insomnia: Secondary | ICD-10-CM | POA: Diagnosis not present

## 2018-01-01 DIAGNOSIS — E872 Acidosis: Secondary | ICD-10-CM | POA: Diagnosis present

## 2018-01-01 DIAGNOSIS — S199XXA Unspecified injury of neck, initial encounter: Secondary | ICD-10-CM | POA: Diagnosis not present

## 2018-01-01 DIAGNOSIS — M5410 Radiculopathy, site unspecified: Secondary | ICD-10-CM | POA: Diagnosis present

## 2018-01-01 DIAGNOSIS — E860 Dehydration: Secondary | ICD-10-CM

## 2018-01-01 DIAGNOSIS — Z6841 Body Mass Index (BMI) 40.0 and over, adult: Secondary | ICD-10-CM

## 2018-01-01 DIAGNOSIS — J302 Other seasonal allergic rhinitis: Secondary | ICD-10-CM | POA: Diagnosis not present

## 2018-01-01 DIAGNOSIS — Z9049 Acquired absence of other specified parts of digestive tract: Secondary | ICD-10-CM | POA: Diagnosis not present

## 2018-01-01 DIAGNOSIS — R079 Chest pain, unspecified: Secondary | ICD-10-CM | POA: Diagnosis not present

## 2018-01-01 DIAGNOSIS — F419 Anxiety disorder, unspecified: Secondary | ICD-10-CM | POA: Diagnosis present

## 2018-01-01 DIAGNOSIS — R110 Nausea: Secondary | ICD-10-CM | POA: Diagnosis not present

## 2018-01-01 DIAGNOSIS — I10 Essential (primary) hypertension: Secondary | ICD-10-CM | POA: Diagnosis present

## 2018-01-01 DIAGNOSIS — Z888 Allergy status to other drugs, medicaments and biological substances status: Secondary | ICD-10-CM | POA: Diagnosis not present

## 2018-01-01 DIAGNOSIS — K219 Gastro-esophageal reflux disease without esophagitis: Secondary | ICD-10-CM | POA: Diagnosis not present

## 2018-01-01 DIAGNOSIS — J13 Pneumonia due to Streptococcus pneumoniae: Secondary | ICD-10-CM | POA: Diagnosis not present

## 2018-01-01 DIAGNOSIS — R52 Pain, unspecified: Secondary | ICD-10-CM | POA: Diagnosis not present

## 2018-01-01 LAB — URINALYSIS, ROUTINE W REFLEX MICROSCOPIC
Bilirubin Urine: NEGATIVE
GLUCOSE, UA: NEGATIVE mg/dL
Ketones, ur: NEGATIVE mg/dL
Nitrite: NEGATIVE
Protein, ur: NEGATIVE mg/dL
SPECIFIC GRAVITY, URINE: 1.008 (ref 1.005–1.030)
pH: 5 (ref 5.0–8.0)

## 2018-01-01 LAB — CBC WITH DIFFERENTIAL/PLATELET
BASOS PCT: 0 %
Basophils Absolute: 0 10*3/uL (ref 0.0–0.1)
EOS ABS: 0 10*3/uL (ref 0.0–0.7)
EOS PCT: 0 %
HCT: 35.8 % — ABNORMAL LOW (ref 36.0–46.0)
Hemoglobin: 11.6 g/dL — ABNORMAL LOW (ref 12.0–15.0)
LYMPHS ABS: 1.1 10*3/uL (ref 0.7–4.0)
Lymphocytes Relative: 8 %
MCH: 29.7 pg (ref 26.0–34.0)
MCHC: 32.4 g/dL (ref 30.0–36.0)
MCV: 91.8 fL (ref 78.0–100.0)
MONO ABS: 0.6 10*3/uL (ref 0.1–1.0)
Monocytes Relative: 5 %
NEUTROS PCT: 87 %
Neutro Abs: 12.3 10*3/uL (ref 1.7–7.7)
PLATELETS: 202 10*3/uL (ref 150–400)
RBC: 3.9 MIL/uL (ref 3.87–5.11)
RDW: 15 % (ref 11.5–15.5)
WBC: 14.1 10*3/uL — ABNORMAL HIGH (ref 4.0–10.5)

## 2018-01-01 LAB — COMPREHENSIVE METABOLIC PANEL
ALK PHOS: 69 U/L (ref 38–126)
ALT: 44 U/L (ref 14–54)
AST: 56 U/L — AB (ref 15–41)
Albumin: 2.9 g/dL — ABNORMAL LOW (ref 3.5–5.0)
Anion gap: 15 (ref 5–15)
BUN: 76 mg/dL — AB (ref 6–20)
CALCIUM: 8.4 mg/dL — AB (ref 8.9–10.3)
CHLORIDE: 100 mmol/L — AB (ref 101–111)
CO2: 18 mmol/L — AB (ref 22–32)
CREATININE: 6.33 mg/dL — AB (ref 0.44–1.00)
GFR calc non Af Amer: 6 mL/min — ABNORMAL LOW (ref >60)
GFR, EST AFRICAN AMERICAN: 7 mL/min — AB (ref >60)
GLUCOSE: 94 mg/dL (ref 65–99)
Potassium: 4.2 mmol/L (ref 3.5–5.1)
SODIUM: 133 mmol/L — AB (ref 135–145)
Total Bilirubin: 2.2 mg/dL — ABNORMAL HIGH (ref 0.3–1.2)
Total Protein: 6.4 g/dL — ABNORMAL LOW (ref 6.5–8.1)

## 2018-01-01 LAB — PROTIME-INR
INR: 1.19
PROTHROMBIN TIME: 15 s (ref 11.4–15.2)

## 2018-01-01 LAB — I-STAT TROPONIN, ED: Troponin i, poc: 0.02 ng/mL (ref 0.00–0.08)

## 2018-01-01 LAB — I-STAT CHEM 8, ED
BUN: 63 mg/dL — ABNORMAL HIGH (ref 6–20)
CALCIUM ION: 0.99 mmol/L — AB (ref 1.15–1.40)
CREATININE: 6.4 mg/dL — AB (ref 0.44–1.00)
Chloride: 106 mmol/L (ref 101–111)
Glucose, Bld: 89 mg/dL (ref 65–99)
HCT: 33 % — ABNORMAL LOW (ref 36.0–46.0)
HEMOGLOBIN: 11.2 g/dL — AB (ref 12.0–15.0)
Potassium: 4.2 mmol/L (ref 3.5–5.1)
SODIUM: 134 mmol/L — AB (ref 135–145)
TCO2: 17 mmol/L — AB (ref 22–32)

## 2018-01-01 LAB — CK: Total CK: 1934 U/L — ABNORMAL HIGH (ref 38–234)

## 2018-01-01 LAB — BRAIN NATRIURETIC PEPTIDE: B NATRIURETIC PEPTIDE 5: 235.8 pg/mL — AB (ref 0.0–100.0)

## 2018-01-01 LAB — LIPASE, BLOOD: LIPASE: 23 U/L (ref 11–51)

## 2018-01-01 LAB — I-STAT CG4 LACTIC ACID, ED: Lactic Acid, Venous: 1.96 mmol/L — ABNORMAL HIGH (ref 0.5–1.9)

## 2018-01-01 LAB — CBG MONITORING, ED: Glucose-Capillary: 78 mg/dL (ref 65–99)

## 2018-01-01 MED ORDER — SODIUM CHLORIDE 0.9 % IV BOLUS (SEPSIS)
1000.0000 mL | Freq: Once | INTRAVENOUS | Status: AC
  Administered 2018-01-01: 1000 mL via INTRAVENOUS

## 2018-01-01 MED ORDER — PIPERACILLIN-TAZOBACTAM 3.375 G IVPB 30 MIN
3.3750 g | Freq: Once | INTRAVENOUS | Status: AC
  Administered 2018-01-01: 3.375 g via INTRAVENOUS
  Filled 2018-01-01: qty 50

## 2018-01-01 MED ORDER — SERTRALINE HCL 100 MG PO TABS
100.0000 mg | ORAL_TABLET | Freq: Every day | ORAL | Status: DC
  Administered 2018-01-02 – 2018-01-05 (×4): 100 mg via ORAL
  Filled 2018-01-01 (×4): qty 1

## 2018-01-01 MED ORDER — ONDANSETRON HCL 4 MG/2ML IJ SOLN
4.0000 mg | Freq: Once | INTRAMUSCULAR | Status: AC
  Administered 2018-01-01: 4 mg via INTRAVENOUS
  Filled 2018-01-01: qty 2

## 2018-01-01 MED ORDER — SODIUM CHLORIDE 0.9 % IV BOLUS
1000.0000 mL | Freq: Once | INTRAVENOUS | Status: AC
  Administered 2018-01-01: 1000 mL via INTRAVENOUS

## 2018-01-01 MED ORDER — SODIUM CHLORIDE 0.9 % IV SOLN
500.0000 mg | INTRAVENOUS | Status: DC
  Administered 2018-01-01 – 2018-01-04 (×4): 500 mg via INTRAVENOUS
  Filled 2018-01-01 (×5): qty 500

## 2018-01-01 MED ORDER — VANCOMYCIN HCL IN DEXTROSE 1-5 GM/200ML-% IV SOLN: 1000.0000 mg | Freq: Once | INTRAVENOUS | Status: DC

## 2018-01-01 MED ORDER — ZOLPIDEM TARTRATE 5 MG PO TABS
5.0000 mg | ORAL_TABLET | Freq: Every evening | ORAL | Status: DC | PRN
Start: 2018-01-01 — End: 2018-01-05

## 2018-01-01 MED ORDER — KCL IN DEXTROSE-NACL 20-5-0.45 MEQ/L-%-% IV SOLN
INTRAVENOUS | Status: DC
  Administered 2018-01-01 – 2018-01-02 (×2): via INTRAVENOUS
  Administered 2018-01-02: 1000 mL via INTRAVENOUS
  Administered 2018-01-03: 03:00:00 via INTRAVENOUS
  Filled 2018-01-01 (×4): qty 1000

## 2018-01-01 MED ORDER — SODIUM CHLORIDE 0.9 % IV BOLUS (SEPSIS)
500.0000 mL | Freq: Once | INTRAVENOUS | Status: AC
  Administered 2018-01-01: 500 mL via INTRAVENOUS

## 2018-01-01 MED ORDER — HEPARIN SODIUM (PORCINE) 5000 UNIT/ML IJ SOLN
5000.0000 [IU] | Freq: Three times a day (TID) | INTRAMUSCULAR | Status: DC
  Administered 2018-01-01 – 2018-01-05 (×11): 5000 [IU] via SUBCUTANEOUS
  Filled 2018-01-01 (×11): qty 1

## 2018-01-01 MED ORDER — SODIUM CHLORIDE 0.9 % IV SOLN
1500.0000 mg | Freq: Once | INTRAVENOUS | Status: AC
  Administered 2018-01-01: 1500 mg via INTRAVENOUS
  Filled 2018-01-01: qty 1500

## 2018-01-01 MED ORDER — SODIUM CHLORIDE 0.9 % IV SOLN
1.0000 g | INTRAVENOUS | Status: DC
  Administered 2018-01-01 – 2018-01-04 (×4): 1 g via INTRAVENOUS
  Filled 2018-01-01 (×4): qty 1
  Filled 2018-01-01: qty 10

## 2018-01-01 NOTE — ED Triage Notes (Addendum)
Per EMS, pt from home.  Pt was found by her cleaning group at her home to be more fatigued than normal.  Pt appears dry and has been weaker.  Pt c/o generalized pain.  States she has history of pancreatic flare ups.  Pt states she has steadily gotten worse for past 4 weeks.  Passed stroke screen by EMS.  12 lead WNL by EMS.  No n/v.  Pt found to have hives everywhere by EMS.  IV 18g LAC.  Started IVF with 250cc in route.  VS 96/60, hr 90, resp 20, 90% ra, 95% on 2l per [place].  cbg 107.

## 2018-01-01 NOTE — ED Notes (Signed)
Pt has been placed on purewick. °

## 2018-01-01 NOTE — Progress Notes (Signed)
Pharmacy Antibiotic Note  Linda Blackburn is a 67 y.o. female admitted on 01/01/2018 with pneumonia and sepsis.  PMH significant for recurrent acute pancreatitis.  Chest Xray concerning for pneumonia.  In the ED, patient received Vancomycin 1500mg  IV.  Pharmacy has been consulted for Vancomycin dosing.  Pt's SCr = 6.4.  H&P states normal SCr = 0.6-0.8  Plan:  Due to current renal function status, will not order a standing Vancomycin regimen at this time.  Will follow renal function and plan to check random Vancomycin level with AM labs on 5/2 (unless renal function significantly improved on 5/1 and then will order sooner)  Height: 5\' 2"  (157.5 cm) Weight: 225 lb (102.1 kg) IBW/kg (Calculated) : 50.1  Temp (24hrs), Avg:99.1 F (37.3 C), Min:99.1 F (37.3 C), Max:99.1 F (37.3 C)  Recent Labs  Lab 01/01/18 1207 01/01/18 1335  WBC 14.1*  --   CREATININE 6.33* 6.40*  LATICACIDVEN  --  1.96*    Estimated Creatinine Clearance: 9.3 mL/min (A) (by C-G formula based on SCr of 6.4 mg/dL (H)).    Allergies  Allergen Reactions  . Other Hives and Swelling    AQUASONIC [place] GEL  . Buprenorphine Hcl     "In a scarry movie" weird thoughts  . Codeine     GI upset  . Morphine And Related     "In a scarry movie" weird thoughts    Antimicrobials this admission: 4/30 Zosyn x 1  4/30 Vanc >>   4/30 Ceftriaxone >> 4/30 Azithromycin >>  Dose adjustments this admission:    Microbiology results: 4/30 BCx: sent  Thank you for allowing pharmacy to be a part of this patient's care.  Everette Rank, PharmD 01/01/2018 7:15 PM

## 2018-01-01 NOTE — H&P (Signed)
History and Physical    Linda Blackburn VHQ:[id number] DOB: Sep 05, 1950 DOA: 01/01/2018  PCP: [name], MD  Patient coming from: home  Chief Complaint: Generalized weakness, abdominal discomfort, nausea and emesis  HPI: Linda Blackburn is a 67 y.o. female with medical history significant of 67 year old with history of recurrent acute pancreatitis of unknown etiology.  Presenting with several days complaint of nausea vomiting and emesis.  Patient decided to wait and as a result was not eating or drinking all that well.  Presents with generalized weakness and debility as well as fatigue.  Nothing she is aware of makes it better.  Eating makes it worse.  The problem has been persistent since onset and has gradually been getting worse.  ED Course: Patient was found to have elevated white blood cell count, x-ray suspicious for pneumonia, and sepsis.  Also had imaging study reporting acute pancreatitis we are consulted for further evaluation recommendations.  Review of Systems: As per HPI otherwise 10 point review of systems negative.    Past Medical History:  Diagnosis Date  . Abnormal uterine bleeding   . Amenorrhea   . Anxiety   . Depression   . Gallstones   . Hay fever   . HTN (hypertension)   . Pancreatitis   . Urine incontinence   . UTI (urinary tract infection)    reoccuring     Past Surgical History:  Procedure Laterality Date  . ABDOMINAL HYSTERECTOMY  1984   fibroids  . APPENDECTOMY  1984  . CHOLECYSTECTOMY  09/08/1999  . ERCP W/ SPHINCTEROTOMY AND BALLOON DILATION  08/2008  . OOPHORECTOMY     Only 1 was taken  . TONSILLECTOMY    . TUBAL LIGATION       reports that she has never smoked. She has never used smokeless tobacco. She reports that she drinks alcohol. She reports that she does not use drugs.  Allergies  Allergen Reactions  . Other Hives and Swelling    AQUASONIC [place] GEL  . Buprenorphine Hcl     "In a scarry movie" weird thoughts  . Codeine     GI  upset  . Morphine And Related     "In a scarry movie" weird thoughts    Family History  Problem Relation Age of Onset  . Alcohol abuse Mother   . Prostate cancer Father   . Stroke Maternal Aunt   . Hiatal hernia Maternal Aunt   . Prostate cancer Paternal Grandfather   . Breast cancer Neg Hx     Prior to Admission medications   Medication Sig Start Date End Date Taking? Authorizing Provider  cetirizine (ZYRTEC) 10 MG tablet Take 10 mg by mouth daily as needed for allergies.    Yes [provider]  famotidine (PEPCID) 20 MG tablet Take 20 mg by mouth daily.    Yes [provider]  losartan (COZAAR) 100 MG tablet Take 1 tablet (100 mg total) by mouth daily. 08/10/17  Yes Burchette, Alinda Sierras, MD  ondansetron (ZOFRAN-ODT) 4 MG disintegrating tablet Take 1 tablet (4 mg total) by mouth every 8 (eight) hours as needed. 08/10/17  Yes Burchette, Alinda Sierras, MD  oxyCODONE (OXY IR/ROXICODONE) 5 MG immediate release tablet Take 1 tablet (5 mg total) by mouth every 4 (four) hours as needed for severe pain. 08/10/17  Yes Burchette, Alinda Sierras, MD  sertraline (ZOLOFT) 100 MG tablet Take 1 tablet (100 mg total) by mouth daily. 08/10/17  Yes Burchette, Alinda Sierras, MD  triamcinolone cream (KENALOG)  0.1 % Apply 1 application topically 2 (two) times daily. 08/10/17  Yes Burchette, Alinda Sierras, MD  zolpidem (AMBIEN) 5 MG tablet Take 1 tablet (5 mg total) by mouth at bedtime as needed for sleep. 08/07/16  Yes Burchette, Alinda Sierras, MD  fluconazole (DIFLUCAN) 100 MG tablet Take 1 tablet (100 mg total) by mouth daily. Patient not taking: Reported on 01/01/2018 08/10/17   [name], MD  fluticasone [facility]) 50 MCG/ACT nasal spray Place 1 spray into both nostrils as needed for allergies or rhinitis. Patient not taking: Reported on 08/10/2017 08/07/16   [name], MD    Physical Exam: Vitals:   01/01/18 1457 01/01/18 1545 01/01/18 1619 01/01/18 1731  BP: (!) 94/50 (!) 90/55 94/61 (!) 98/58  Pulse:  (!) 109 94 (!) 107 99  Resp: 15 (!) 28 (!) 28 (!) 31  Temp:      TempSrc:      SpO2: 93% 94% 94% 97%  Weight:      Height:        Constitutional: NAD, calm, comfortable Vitals:   01/01/18 1457 01/01/18 1545 01/01/18 1619 01/01/18 1731  BP: (!) 94/50 (!) 90/55 94/61 (!) 98/58  Pulse: (!) 109 94 (!) 107 99  Resp: 15 (!) 28 (!) 28 (!) 31  Temp:      TempSrc:      SpO2: 93% 94% 94% 97%  Weight:      Height:       Eyes: PERRL, lids and conjunctivae normal ENMT: Mucous membranes are moist. Posterior pharynx clear of any exudate or lesions.Normal dentition.  Neck: normal, supple, no masses, no thyromegaly Respiratory: clear to auscultation bilaterally, no wheezing, rhales.  No accessory muscle use.  Cardiovascular: Regular rate and rhythm, no murmurs / rubs / gallops. No extremity edema. 2+ pedal pulses. No carotid bruits.  Abdomen: no tenderness, no masses palpated. No hepatosplenomegaly. Bowel sounds positive.  Musculoskeletal: no clubbing / cyanosis. No joint deformity upper and lower extremities. Good ROM, no contractures. Normal muscle tone.  Skin: no rashes, lesions, ulcers. No induration Neurologic: Sensation light touch intact, moves extremities equally, answers questions appropriately Psychiatric: Normal judgment and insight. Normal mood.    Labs on Admission: I have personally reviewed following labs and imaging studies  CBC: Recent Labs  Lab 01/01/18 1207 01/01/18 1335  WBC 14.1*  --   NEUTROABS 12.3  --   HGB 11.6* 11.2*  HCT 35.8* 33.0*  MCV 91.8  --   PLT 202  --    Basic Metabolic Panel: Recent Labs  Lab 01/01/18 1207 01/01/18 1335  NA 133* 134*  K 4.2 4.2  CL 100* 106  CO2 18*  --   GLUCOSE 94 89  BUN 76* 63*  CREATININE 6.33* 6.40*  CALCIUM 8.4*  --    GFR: Estimated Creatinine Clearance: 9.3 mL/min (A) (by C-G formula based on SCr of 6.4 mg/dL (H)). Liver Function Tests: Recent Labs  Lab 01/01/18 1207  AST 56*  ALT 44  ALKPHOS 69    BILITOT 2.2*  PROT 6.4*  ALBUMIN 2.9*   Recent Labs  Lab 01/01/18 1207  LIPASE 23   No results for input(s): AMMONIA in the last 168 hours. Coagulation Profile: Recent Labs  Lab 01/01/18 1207  INR 1.19   Cardiac Enzymes: Recent Labs  Lab 01/01/18 1207  CKTOTAL 1,934*   BNP (last 3 results) No results for input(s): PROBNP in the last 8760 hours. HbA1C: No results for input(s): HGBA1C in the last 72  hours. CBG: Recent Labs  Lab 01/01/18 1136  GLUCAP 78   Lipid Profile: No results for input(s): CHOL, HDL, LDLCALC, TRIG, CHOLHDL, LDLDIRECT in the last 72 hours. Thyroid Function Tests: No results for input(s): TSH, T4TOTAL, FREET4, T3FREE, THYROIDAB in the last 72 hours. Anemia Panel: No results for input(s): VITAMINB12, FOLATE, FERRITIN, TIBC, IRON, RETICCTPCT in the last 72 hours. Urine analysis:    Component Value Date/Time   COLORURINE YELLOW 03/21/2016 0031   APPEARANCEUR CLEAR 03/21/2016 0031   LABSPEC 1.018 03/21/2016 0031   PHURINE 5.0 03/21/2016 0031   GLUCOSEU NEGATIVE 03/21/2016 0031   HGBUR NEGATIVE 03/21/2016 0031   BILIRUBINUR neg 09/14/2016 1158   KETONESUR NEGATIVE 03/21/2016 0031   PROTEINUR neg 09/14/2016 1158   PROTEINUR NEGATIVE 03/21/2016 0031   UROBILINOGEN negative 09/14/2016 1158   UROBILINOGEN 0.2 06/07/2014 0005   NITRITE neg 09/14/2016 1158   NITRITE NEGATIVE 03/21/2016 0031   LEUKOCYTESUR Negative 09/14/2016 1158    Radiological Exams on Admission: Ct Abdomen Pelvis Wo Contrast  Result Date: 01/01/2018 CLINICAL DATA:  Abdominal distention EXAM: CT ABDOMEN AND PELVIS WITHOUT CONTRAST TECHNIQUE: Multidetector CT imaging of the abdomen and pelvis was performed following the standard protocol without IV contrast. COMPARISON:  03/21/2016 FINDINGS: Lower chest:  Lower lobe atelectasis, multi segment on the left. Hepatobiliary: Possible hepatic steatosis.Cholecystectomy. No visible choledocholithiasis. Pancreas: Diffuse expansion and  peripancreatic edema. Gas at the level of the head is stable and attributed to a duodenal diverticulum. No gross fluid collection. Spleen: Unremarkable. Adrenals/Urinary Tract: Negative adrenals. No hydronephrosis or stone. Unremarkable bladder. Stomach/Bowel: No obstruction. Appendectomy. Sigmoid diverticulosis. Vascular/Lymphatic: Mild atherosclerotic calcification. No mass or adenopathy. Reproductive:Hysterectomy Other: No ascites or pneumoperitoneum. Musculoskeletal: Degenerative changes without acute or aggressive finding. IMPRESSION: 1. Acute pancreatitis. 2. Multi segment atelectasis. 3. Cholecystectomy Electronically Signed   By: Monte Fantasia M.D.   On: 01/01/2018 15:31   Dg Chest Port 1 View  Result Date: 01/01/2018 CLINICAL DATA:  Chest pain EXAM: PORTABLE CHEST 1 VIEW COMPARISON:  March 21, 2016 FINDINGS: There is airspace consolidation in the left lower lobe with small left pleural effusion. The lungs elsewhere are clear. Heart is mildly enlarged with pulmonary vascularity within normal limits. No adenopathy. There is aortic atherosclerosis. No evident bone lesions. IMPRESSION: Left lower lobe airspace consolidation with small left pleural effusion. Lungs elsewhere clear. Heart mildly enlarged, stable. There is aortic atherosclerosis. Aortic Atherosclerosis (ICD10-I70.0). Electronically Signed   By: Lowella Grip III M.D.   On: 01/01/2018 13:02    EKG: Independently reviewed.  Sinus rhythm with no ST elevations or depressions  Assessment/Plan Active Problems:   Acute recurrent pancreatitis -Plan will be to place on bowel rest.  Continue n.p.o. status and placed on maintenance IV fluids.  Etiology uncertain has been this way for the last 20 years.  Lipase within normal limits but imaging study reports inflammation surrounding pancreas suspicious for acute pancreatitis    Sepsis ([place]) - Secondary to pneumonia.  Continue antibiotic regimen - Follow-up with culture  Lactic  acidosis -Most likely secondary to sepsis and/or dehydration from principal problems  Acute renal injury -Serum creatinine is usually around 0.6-0.8.  Most likely prerenal etiology and/or ATN from sepsis.  For now we will try IV fluid rehydration and reassess next a.m.  Hypotension -Resolved with IV fluid    Depression, recurrent (HCC) - stable continue antidepressant  DVT prophylaxis: heparin Code Status: full Family Communication: d/c patient and suspected family member at bedside. Disposition Plan: stepdown Consults called: none Admission status:  inpatient   Velvet Bathe MD [facility] Pager [phone]  If 7PM-7AM, please contact night-coverage [email or web address] Password [email or web address]  01/01/2018, 6:01 PM

## 2018-01-01 NOTE — Progress Notes (Signed)
Pharmacy Note   A consult was received from an ED physician for vancomycin and Zosyn per pharmacy dosing.    The patient's profile has been reviewed for ht/wt/allergies/indication/available labs.     A one time order has been placed for vancomycin 1500 mg IV x1 and Zosyn 3.375 gr IV x 1.  Further antibiotics/pharmacy consults should be ordered by admitting physician if indicated.                       Thank you,   Royetta Asal, PharmD, BCPS Pager [phone] 01/01/2018 11:52 AM

## 2018-01-01 NOTE — ED Notes (Signed)
ED TO INPATIENT HANDOFF REPORT  Name/Age/Gender Linda Blackburn 67 y.o. female  Code Status Code Status History    Date Active Date Inactive Code Status Order ID Comments User Context   06/06/2014 0250 06/12/2014 4132 Full Code [id number]  Etta Quill, DO ED    Advance Directive Documentation     Most Recent Value  Type of Advance Directive  Living will  Pre-existing out of facility DNR order (yellow form or pink MOST form)  -  "MOST" Form in Place?  -      Home/SNF/Other Home  Chief Complaint dehydration  Level of Care/Admitting Diagnosis ED Disposition    ED Disposition Condition [place]: [place] [[id number]]  Level of Care: Stepdown [14]  Admit to SDU based on following criteria: Hemodynamic compromise or significant risk of instability:  Patient requiring short term acute titration and management of vasoactive drips, and invasive monitoring (i.e., CVP and Arterial line).  Diagnosis: Sepsis [facility]) [[id number]]  Admitting Physician: Velvet Bathe [[id number]]  Attending Physician: Velvet Bathe [phone]  Estimated length of stay: 3 - 4 days  Certification:: I certify this patient will need inpatient services for at least 2 midnights  PT Class (Do Not Modify): Inpatient [101]  PT Acc Code (Do Not Modify): Private [1]       Medical History Past Medical History:  Diagnosis Date  . Abnormal uterine bleeding   . Amenorrhea   . Anxiety   . Depression   . Gallstones   . Hay fever   . HTN (hypertension)   . Pancreatitis   . Urine incontinence   . UTI (urinary tract infection)    reoccuring     Allergies Allergies  Allergen Reactions  . Other Hives and Swelling    AQUASONIC [place] GEL  . Buprenorphine Hcl     "In a scarry movie" weird thoughts  . Codeine     GI upset  . Morphine And Related     "In a scarry movie" weird thoughts    IV Location/Drains/Wounds Patient Lines/Drains/Airways Status   Active Line/Drains/Airways     Name:   Placement date:   Placement time:   Site:   Days:   Peripheral IV 01/01/18 Left Antecubital   01/01/18    -    Antecubital   less than 1   Peripheral IV 01/01/18 Left Hand   01/01/18    1200    Hand   less than 1          Labs/Imaging Results for orders placed or performed during the hospital encounter of 01/01/18 (from the past 48 hour(s))  CBG monitoring, ED     Status: None   Collection Time: 01/01/18 11:36 AM  Result Value Ref Range   Glucose-Capillary 78 65 - 99 mg/dL  Comprehensive metabolic panel     Status: Abnormal   Collection Time: 01/01/18 12:07 PM  Result Value Ref Range   Sodium 133 (L) 135 - 145 mmol/L   Potassium 4.2 3.5 - 5.1 mmol/L   Chloride 100 (L) 101 - 111 mmol/L   CO2 18 (L) 22 - 32 mmol/L   Glucose, Bld 94 65 - 99 mg/dL   BUN 76 (H) 6 - 20 mg/dL   Creatinine, Ser 6.33 (H) 0.44 - 1.00 mg/dL   Calcium 8.4 (L) 8.9 - 10.3 mg/dL   Total Protein 6.4 (L) 6.5 - 8.1 g/dL   Albumin 2.9 (L) 3.5 - 5.0 g/dL   AST 56 (  H) 15 - 41 U/L   ALT 44 14 - 54 U/L   Alkaline Phosphatase 69 38 - 126 U/L   Total Bilirubin 2.2 (H) 0.3 - 1.2 mg/dL   GFR calc non Af Amer 6 (L) >60 mL/min   GFR calc Af Amer 7 (L) >60 mL/min    Comment: (NOTE) The eGFR has been calculated using the CKD EPI equation. This calculation has not been validated in all clinical situations. eGFR's persistently <60 mL/min signify possible Chronic Kidney Disease.    Anion gap 15 5 - 15    Comment: Performed at [facility], [place] [address] [place], [place] [address]  CBC WITH DIFFERENTIAL     Status: Abnormal   Collection Time: 01/01/18 12:07 PM  Result Value Ref Range   WBC 14.1 (H) 4.0 - 10.5 K/uL    Comment: WHITE COUNT CONFIRMED ON SMEAR   RBC 3.90 3.87 - 5.11 MIL/uL   Hemoglobin 11.6 (L) 12.0 - 15.0 g/dL   HCT 35.8 (L) 36.0 - 46.0 %   MCV 91.8 78.0 - 100.0 fL   MCH 29.7 26.0 - 34.0 pg   MCHC 32.4 30.0 - 36.0 g/dL   RDW 15.0 11.5 - 15.5 %   Platelets 202 150 - 400  K/uL    Comment: SPECIMEN CHECKED FOR CLOTS PLATELET COUNT CONFIRMED BY SMEAR    Neutrophils Relative % 87 %   Neutro Abs 12.3 1.7 - 7.7 K/uL   Lymphocytes Relative 8 %   Lymphs Abs 1.1 0.7 - 4.0 K/uL   Monocytes Relative 5 %   Monocytes Absolute 0.6 0.1 - 1.0 K/uL   Eosinophils Relative 0 %   Eosinophils Absolute 0.0 0.0 - 0.7 K/uL   Basophils Relative 0 %   Basophils Absolute 0.0 0.0 - 0.1 K/uL   WBC Morphology VACUOLATED NEUTROPHILS     Comment: Performed at [facility], [place] [address] [place], [place] [address]  Lipase, blood     Status: None   Collection Time: 01/01/18 12:07 PM  Result Value Ref Range   Lipase 23 11 - 51 U/L    Comment: Performed at [facility], [place] [address] [place], [place] [address]  Protime-INR     Status: None   Collection Time: 01/01/18 12:07 PM  Result Value Ref Range   Prothrombin Time 15.0 11.4 - 15.2 seconds   INR 1.19     Comment: Performed at [facility], [place] [address], [place], [place] [address]  CK     Status: Abnormal   Collection Time: 01/01/18 12:07 PM  Result Value Ref Range   Total CK 1,934 (H) 38 - 234 U/L    Comment: Performed at [facility], [place] [address], [place], [place] [address]  Brain natriuretic peptide     Status: Abnormal   Collection Time: 01/01/18 12:08 PM  Result Value Ref Range   B Natriuretic Peptide 235.8 (H) 0.0 - 100.0 pg/mL    Comment: Performed at [facility], [place] [address], [place],  [address]  I-stat troponin, ED (not at [facility], [facility])     Status: None   Collection Time: 01/01/18 12:12 PM  Result Value Ref Range   Troponin i, poc 0.02 0.00 - 0.08 ng/mL   Comment 3            Comment: Due to the release kinetics of cTnI, a negative result within the first hours of the onset of symptoms does  not rule out myocardial infarction with certainty. If myocardial infarction is still suspected, repeat the test  at appropriate intervals.   I-Stat CG4 Lactic Acid, ED  (not at  [facility])     Status: Abnormal   Collection Time: 01/01/18  1:35 PM  Result Value Ref Range   Lactic Acid, Venous 1.96 (H) 0.5 - 1.9 mmol/L  I-stat Chem 8, ED     Status: Abnormal   Collection Time: 01/01/18  1:35 PM  Result Value Ref Range   Sodium 134 (L) 135 - 145 mmol/L   Potassium 4.2 3.5 - 5.1 mmol/L   Chloride 106 101 - 111 mmol/L   BUN 63 (H) 6 - 20 mg/dL   Creatinine, Ser 6.40 (H) 0.44 - 1.00 mg/dL   Glucose, Bld 89 65 - 99 mg/dL   Calcium, Ion 0.99 (L) 1.15 - 1.40 mmol/L   TCO2 17 (L) 22 - 32 mmol/L   Hemoglobin 11.2 (L) 12.0 - 15.0 g/dL   HCT 33.0 (L) 36.0 - 46.0 %  Urinalysis, Routine w reflex microscopic     Status: Abnormal   Collection Time: 01/01/18  6:14 PM  Result Value Ref Range   Color, Urine YELLOW YELLOW   APPearance HAZY (A) CLEAR   Specific Gravity, Urine 1.008 1.005 - 1.030   pH 5.0 5.0 - 8.0   Glucose, UA NEGATIVE NEGATIVE mg/dL   Hgb urine dipstick LARGE (A) NEGATIVE   Bilirubin Urine NEGATIVE NEGATIVE   Ketones, ur NEGATIVE NEGATIVE mg/dL   Protein, ur NEGATIVE NEGATIVE mg/dL   Nitrite NEGATIVE NEGATIVE   Leukocytes, UA SMALL (A) NEGATIVE   RBC / HPF 0-5 0 - 5 RBC/hpf   WBC, UA 11-20 0 - 5 WBC/hpf   Bacteria, UA RARE (A) NONE SEEN   Squamous Epithelial / LPF 0-5 0 - 5    Comment: Please note change in reference range. Performed at [facility], [place] [address], [place], [place] [address]    Ct Abdomen Pelvis Wo Contrast  Result Date: 01/01/2018 CLINICAL DATA:  Abdominal distention EXAM: CT ABDOMEN AND PELVIS WITHOUT CONTRAST TECHNIQUE: Multidetector CT imaging of the abdomen and pelvis was performed following the standard protocol without IV contrast. COMPARISON:  03/21/2016 FINDINGS: Lower chest:  Lower lobe atelectasis, multi segment on the left. Hepatobiliary: Possible hepatic steatosis.Cholecystectomy. No visible choledocholithiasis. Pancreas: Diffuse expansion and  peripancreatic edema. Gas at the level of the head is stable and attributed to a duodenal diverticulum. No gross fluid collection. Spleen: Unremarkable. Adrenals/Urinary Tract: Negative adrenals. No hydronephrosis or stone. Unremarkable bladder. Stomach/Bowel: No obstruction. Appendectomy. Sigmoid diverticulosis. Vascular/Lymphatic: Mild atherosclerotic calcification. No mass or adenopathy. Reproductive:Hysterectomy Other: No ascites or pneumoperitoneum. Musculoskeletal: Degenerative changes without acute or aggressive finding. IMPRESSION: 1. Acute pancreatitis. 2. Multi segment atelectasis. 3. Cholecystectomy Electronically Signed   By: Monte Fantasia M.D.   On: 01/01/2018 15:31   Dg Chest Port 1 View  Result Date: 01/01/2018 CLINICAL DATA:  Chest pain EXAM: PORTABLE CHEST 1 VIEW COMPARISON:  March 21, 2016 FINDINGS: There is airspace consolidation in the left lower lobe with small left pleural effusion. The lungs elsewhere are clear. Heart is mildly enlarged with pulmonary vascularity within normal limits. No adenopathy. There is aortic atherosclerosis. No evident bone lesions. IMPRESSION: Left lower lobe airspace consolidation with small left pleural effusion. Lungs elsewhere clear. Heart mildly enlarged, stable. There is aortic atherosclerosis. Aortic Atherosclerosis (ICD10-I70.0). Electronically Signed   By: Lowella Grip III M.D.   On: 01/01/2018 13:02    Pending  Labs [employer] (From admission, onward)   Start     Ordered   01/03/18 0500  Vancomycin, random  Once,   R     01/01/18 1952   01/01/18 1132  Blood Culture (routine x 2)  BLOOD CULTURE X 2,   STAT     01/01/18 1135   Signed and Held  Culture, blood (routine x 2) Call MD if unable to obtain prior to antibiotics being given  BLOOD CULTURE X 2,   R    Comments:  If blood cultures drawn in Emergency Department - Do not draw and cancel order   Question:  Patient immune status  Answer:  Immunocompromised   Signed and Held    Signed and Held  Culture, sputum-assessment  Once,   R    Question:  Patient immune status  Answer:  Immunocompromised   Signed and Held   Signed and Held  Gram stain  Once,   R    Question:  Patient immune status  Answer:  Immunocompromised   Signed and Held   Signed and Held  HIV antibody (Routine Screening)  Once,   R     Signed and Held   Signed and Held  Strep pneumoniae urinary antigen  Once,   R     Signed and Held   Visual merchandiser and Held  Basic metabolic panel  Tomorrow morning,   R     Signed and Held   Signed and Held  CBC  Tomorrow morning,   R     Signed and Held      Vitals/Pain Today's Vitals   01/01/18 1840 01/01/18 1845 01/01/18 1900 01/01/18 1915  BP: (!) 87/55 (!) 96/56 95/61 99/60   Pulse: 99 97 97 96  Resp: 20 (!) 29 (!) 22 (!) 24  Temp:      TempSrc:      SpO2: 96% 96% 97% 96%  Weight:      Height:      PainSc:        Isolation Precautions No active isolations  Medications Medications  cefTRIAXone (ROCEPHIN) 1 g in sodium chloride 0.9 % 100 mL IVPB (has no administration in time range)  azithromycin (ZITHROMAX) 500 mg in sodium chloride 0.9 % 250 mL IVPB (has no administration in time range)  sodium chloride 0.9 % bolus 1,000 mL (0 mLs Intravenous Stopped 01/01/18 1342)    And  sodium chloride 0.9 % bolus 1,000 mL (0 mLs Intravenous Stopped 01/01/18 1343)    And  sodium chloride 0.9 % bolus 1,000 mL (0 mLs Intravenous Stopped 01/01/18 1539)    And  sodium chloride 0.9 % bolus 500 mL (0 mLs Intravenous Stopped 01/01/18 1532)  piperacillin-tazobactam (ZOSYN) IVPB 3.375 g (0 g Intravenous Stopped 01/01/18 1242)  vancomycin (VANCOCIN) 1,500 mg in sodium chloride 0.9 % 500 mL IVPB (0 mg Intravenous Stopped 01/01/18 1630)  sodium chloride 0.9 % bolus 1,000 mL (0 mLs Intravenous Stopped 01/01/18 1814)  ondansetron (ZOFRAN) injection 4 mg (4 mg Intravenous Given 01/01/18 1536)    Mobility non-ambulatory currently due weakness, but walks typically '

## 2018-01-01 NOTE — ED Notes (Signed)
RN was notified about Oxygen dropping to 88. Placed on nasal can.

## 2018-01-01 NOTE — ED Notes (Signed)
Bed: XB26 Expected date:  Expected time:  Means of arrival:  Comments: EMS-dehydrated

## 2018-01-01 NOTE — ED Notes (Signed)
Gave report to Hammond, RN for room 1239 or 1241 depending on if 1239 is clean.

## 2018-01-01 NOTE — ED Provider Notes (Signed)
[facility] DEPT Provider Note   CSN: [id number] Arrival date & time: 01/01/18  1110     History   Chief Complaint Chief Complaint  Patient presents with  . Fatigue    HPI Linda Blackburn is a 67 y.o. female.  HPI Patient reports she has a history of pancreatitis.  She reports every year or so she gets a fairly severe attack.  She in the past has tried to wait it out.  She reports she has been having problems with pain for about a month now.  Reports she was not having recurrent vomiting but periodically had episodes of vomiting.  3 days ago she reports she vomited all day and then into the subsequent day.  Yesterday she was unable to eat anything.  She reports she has been taking in fluids.  Ports she think she is very dehydrated.  Reports the abdominal pain has abated now but she hurts all over at this point.  She is very achy.  Ports she is extremely weak and could not get up off of her couch.  Patient housekeeper had come to the door and tried to text her and recognize that she was in the house but unable to answer the door.  EMS reports patient was extremely weak with low blood pressure they did initiate fluids but no other medications.  Notably on arrival patient had a urticarial rash on her upper extremities, under her breasts and on her knees.  Patient reports she started to notice that earlier today or yesterday.  She reports the only medications she takes are losartan, Zoloft and sometimes Zyrtec.  She denies any recent or new medications.  It does not seem to be associated with any treatment to this point.  Patient has had multiple episodes of medical treatment without latex allergy.  She cannot think of anything that may be causing it. Past Medical History:  Diagnosis Date  . Abnormal uterine bleeding   . Amenorrhea   . Anxiety   . Depression   . Gallstones   . Hay fever   . HTN (hypertension)   . Pancreatitis   . Urine incontinence   . UTI  (urinary tract infection)    reoccuring     Patient Active Problem List   Diagnosis Date Noted  . Alternating constipation and diarrhea 09/26/2016  . Obesity (BMI 30-39.9) 07/20/2014  . Chronic insomnia 06/16/2014  . Hypokalemia 06/10/2014  . Volume overload 06/10/2014  . HCAP (healthcare-associated pneumonia) 06/10/2014  . UTI (urinary tract infection) 06/10/2014  . Leukocytosis 06/10/2014  . Acute respiratory failure with hypoxia ([facility]) 06/10/2014  . Acute recurrent pancreatitis 06/06/2014  . Pancreatitis 06/06/2014  . Hypertension 09/11/2012  . Adjustment disorder with mixed anxiety and depressed mood 09/11/2012  . Pancreas divisum 01/18/2011  . URI 09/02/2010  . Depression, recurrent ([place]) 04/01/2010  . PANCREATITIS, HX OF 04/01/2010    Past Surgical History:  Procedure Laterality Date  . ABDOMINAL HYSTERECTOMY  1984   fibroids  . APPENDECTOMY  1984  . CHOLECYSTECTOMY  09/08/1999  . ERCP W/ SPHINCTEROTOMY AND BALLOON DILATION  08/2008  . OOPHORECTOMY     Only 1 was taken  . TONSILLECTOMY    . TUBAL LIGATION       OB History    Gravida  4   Para  2   Term  2   Preterm      AB  2   Living  2     SAB  TAB      Ectopic      Multiple      Live Births  2            Home Medications    Prior to Admission medications   Medication Sig Start Date End Date Taking? Authorizing Provider  cetirizine (ZYRTEC) 10 MG tablet Take 10 mg by mouth daily as needed for allergies.    Yes [provider]  famotidine (PEPCID) 20 MG tablet Take 20 mg by mouth daily.    Yes [provider]  losartan (COZAAR) 100 MG tablet Take 1 tablet (100 mg total) by mouth daily. 08/10/17  Yes Burchette, Alinda Sierras, MD  ondansetron (ZOFRAN-ODT) 4 MG disintegrating tablet Take 1 tablet (4 mg total) by mouth every 8 (eight) hours as needed. 08/10/17  Yes Burchette, Alinda Sierras, MD  oxyCODONE (OXY IR/ROXICODONE) 5 MG immediate release tablet Take 1 tablet (5 mg total) by  mouth every 4 (four) hours as needed for severe pain. 08/10/17  Yes Burchette, Alinda Sierras, MD  sertraline (ZOLOFT) 100 MG tablet Take 1 tablet (100 mg total) by mouth daily. 08/10/17  Yes Burchette, Alinda Sierras, MD  triamcinolone cream (KENALOG) 0.1 % Apply 1 application topically 2 (two) times daily. 08/10/17  Yes Burchette, Alinda Sierras, MD  zolpidem (AMBIEN) 5 MG tablet Take 1 tablet (5 mg total) by mouth at bedtime as needed for sleep. 08/07/16  Yes Burchette, Alinda Sierras, MD  fluconazole (DIFLUCAN) 100 MG tablet Take 1 tablet (100 mg total) by mouth daily. Patient not taking: Reported on 01/01/2018 08/10/17   [name], MD  fluticasone [facility]) 50 MCG/ACT nasal spray Place 1 spray into both nostrils as needed for allergies or rhinitis. Patient not taking: Reported on 08/10/2017 08/07/16   [name], MD    Family History Family History  Problem Relation Age of Onset  . Alcohol abuse Mother   . Prostate cancer Father   . Stroke Maternal Aunt   . Hiatal hernia Maternal Aunt   . Prostate cancer Paternal Grandfather   . Breast cancer Neg Hx     Social History Social History   Tobacco Use  . Smoking status: Never Smoker  . Smokeless tobacco: Never Used  Substance Use Topics  . Alcohol use: Yes    Alcohol/week: 0.0 oz    Comment: occ - hardly every   . Drug use: No     Allergies   Other; Buprenorphine hcl; Codeine; and Morphine and related   Review of Systems Review of Systems 10 Systems reviewed and are negative for acute change except as noted in the HPI.   Physical Exam Updated Vital Signs BP 94/61   Pulse (!) 107   Temp 99.1 F (37.3 C) (Rectal)   Resp (!) 28   Ht 5\' 2"  (1.575 m)   Wt 102.1 kg (225 lb)   SpO2 94%   BMI 41.15 kg/m   Physical Exam  Constitutional: She is oriented to person, place, and time.  Patient is ill in appearance. She is alert and appropriate.  No respiratory distress.  HENT:  Head: Normocephalic and atraumatic.  Very dry MM  Eyes:  Pupils are equal, round, and reactive to light. EOM are normal.  Eyes somewhat sunken.  Will symmetric and responsive.  Neck: Neck supple.  Cardiovascular: Normal rate, regular rhythm, normal heart sounds and intact distal pulses.  Patient does have palpable pedal pulses.  1+  Pulmonary/Chest: Effort normal and breath sounds normal.  Abdominal:  Abdomen seems distended.  Patient is denying she has significant pain to palpation.  Musculoskeletal:  No peripheral edema.  Patient is generally weak  Neurological: She is alert and oriented to person, place, and time. She exhibits normal muscle tone. Coordination normal.  Skin:  Patient is pale.  Urticarial rash beneath her breasts on her arms and on her knees.  See attached images  Psychiatric: She has a normal mood and affect.             ED Treatments / Results  Labs (all labs ordered are listed, but only abnormal results are displayed) Labs Reviewed  COMPREHENSIVE METABOLIC PANEL - Abnormal; Notable for the following components:      Result Value   Sodium 133 (*)    Chloride 100 (*)    CO2 18 (*)    BUN 76 (*)    Creatinine, Ser 6.33 (*)    Calcium 8.4 (*)    Total Protein 6.4 (*)    Albumin 2.9 (*)    AST 56 (*)    Total Bilirubin 2.2 (*)    GFR calc non Af Amer 6 (*)    GFR calc Af Amer 7 (*)    All other components within normal limits  CBC WITH DIFFERENTIAL/PLATELET - Abnormal; Notable for the following components:   WBC 14.1 (*)    Hemoglobin 11.6 (*)    HCT 35.8 (*)    All other components within normal limits  BRAIN NATRIURETIC PEPTIDE - Abnormal; Notable for the following components:   B Natriuretic Peptide 235.8 (*)    All other components within normal limits  CK - Abnormal; Notable for the following components:   Total CK 1,934 (*)    All other components within normal limits  I-STAT CG4 LACTIC ACID, ED - Abnormal; Notable for the following components:   Lactic Acid, Venous 1.96 (*)    All other  components within normal limits  I-STAT CHEM 8, ED - Abnormal; Notable for the following components:   Sodium 134 (*)    BUN 63 (*)    Creatinine, Ser 6.40 (*)    Calcium, Ion 0.99 (*)    TCO2 17 (*)    Hemoglobin 11.2 (*)    HCT 33.0 (*)    All other components within normal limits  CULTURE, BLOOD (ROUTINE X 2)  CULTURE, BLOOD (ROUTINE X 2)  LIPASE, BLOOD  PROTIME-INR  URINALYSIS, ROUTINE W REFLEX MICROSCOPIC  I-STAT TROPONIN, ED  CBG MONITORING, ED  I-STAT CG4 LACTIC ACID, ED    EKG EKG Interpretation  Date/Time:  Tuesday January 01 2018 11:24:47 EDT Ventricular Rate:  95 PR Interval:    QRS Duration: 95 QT Interval:  345 QTC Calculation: 434 R Axis:   8 Text Interpretation:  Sinus rhythm Low voltage, precordial leads Abnormal R-wave progression, early transition no change from previous Confirmed by Charlesetta Shanks [phone]) on 01/01/2018 11:56:56 AM   Radiology Ct Abdomen Pelvis Wo Contrast  Result Date: 01/01/2018 CLINICAL DATA:  Abdominal distention EXAM: CT ABDOMEN AND PELVIS WITHOUT CONTRAST TECHNIQUE: Multidetector CT imaging of the abdomen and pelvis was performed following the standard protocol without IV contrast. COMPARISON:  03/21/2016 FINDINGS: Lower chest:  Lower lobe atelectasis, multi segment on the left. Hepatobiliary: Possible hepatic steatosis.Cholecystectomy. No visible choledocholithiasis. Pancreas: Diffuse expansion and peripancreatic edema. Gas at the level of the head is stable and attributed to a duodenal diverticulum. No gross fluid collection. Spleen: Unremarkable. Adrenals/Urinary Tract: Negative adrenals. No hydronephrosis or stone. Unremarkable bladder.  Stomach/Bowel: No obstruction. Appendectomy. Sigmoid diverticulosis. Vascular/Lymphatic: Mild atherosclerotic calcification. No mass or adenopathy. Reproductive:Hysterectomy Other: No ascites or pneumoperitoneum. Musculoskeletal: Degenerative changes without acute or aggressive finding. IMPRESSION: 1.  Acute pancreatitis. 2. Multi segment atelectasis. 3. Cholecystectomy Electronically Signed   By: Monte Fantasia M.D.   On: 01/01/2018 15:31   Dg Chest Port 1 View  Result Date: 01/01/2018 CLINICAL DATA:  Chest pain EXAM: PORTABLE CHEST 1 VIEW COMPARISON:  March 21, 2016 FINDINGS: There is airspace consolidation in the left lower lobe with small left pleural effusion. The lungs elsewhere are clear. Heart is mildly enlarged with pulmonary vascularity within normal limits. No adenopathy. There is aortic atherosclerosis. No evident bone lesions. IMPRESSION: Left lower lobe airspace consolidation with small left pleural effusion. Lungs elsewhere clear. Heart mildly enlarged, stable. There is aortic atherosclerosis. Aortic Atherosclerosis (ICD10-I70.0). Electronically Signed   By: Lowella Grip III M.D.   On: 01/01/2018 13:02    Procedures Procedures (including critical care time) CRITICAL CARE Performed by: Si Gaul   Total critical care time: 45 minutes  Critical care time was exclusive of separately billable procedures and treating other patients.  Critical care was necessary to treat or prevent imminent or life-threatening deterioration.  Critical care was time spent personally by me on the following activities: development of treatment plan with patient and/or surrogate as well as nursing, discussions with consultants, evaluation of patient's response to treatment, examination of patient, obtaining history from patient or surrogate, ordering and performing treatments and interventions, ordering and review of laboratory studies, ordering and review of radiographic studies, pulse oximetry and re-evaluation of patient's condition. Medications Ordered in ED Medications  sodium chloride 0.9 % bolus 1,000 mL (0 mLs Intravenous Stopped 01/01/18 1342)    And  sodium chloride 0.9 % bolus 1,000 mL (0 mLs Intravenous Stopped 01/01/18 1343)    And  sodium chloride 0.9 % bolus 1,000 mL (0 mLs  Intravenous Stopped 01/01/18 1539)    And  sodium chloride 0.9 % bolus 500 mL (0 mLs Intravenous Stopped 01/01/18 1532)  piperacillin-tazobactam (ZOSYN) IVPB 3.375 g (0 g Intravenous Stopped 01/01/18 1242)  vancomycin (VANCOCIN) 1,500 mg in sodium chloride 0.9 % 500 mL IVPB (1,500 mg Intravenous New Bag/Given 01/01/18 1306)  sodium chloride 0.9 % bolus 1,000 mL (1,000 mLs Intravenous New Bag/Given 01/01/18 1540)  ondansetron (ZOFRAN) injection 4 mg (4 mg Intravenous Given 01/01/18 1536)     Initial Impression / Assessment and Plan / ED Course  I have reviewed the triage vital signs and the nursing notes.  Pertinent labs & imaging results that were available during my care of the patient were reviewed by me and considered in my medical decision making (see chart for details).      Consult: Tried hospitalist Dr. Wendee Beavers for admission. Final Clinical Impressions(s) / ED Diagnoses   Final diagnoses:  Sepsis, due to unspecified organism ([place])  Acute pancreatitis, unspecified complication status, unspecified pancreatitis type  Acute renal failure, unspecified acute renal failure type ([place])  Dehydration  Patient presents acutely ill as outlined above.  Patient predominately identifies illness is 3 days duration.  She however qualifies that she is likely had at least a months worth of epigastric pain that she attributes to pancreatitis.  She reports after a couple of hospitalizations in the past, she is started to try to treated at home by recommended measures.  Patient presented hypotensive and severely dehydrated in appearance with very dry mouth.  Concern for possible sepsis on arrival.  This is  protocol initiated.  Patient does have a left lower consolidation identified on chest x-ray.  Possible pneumonia as inciting cause for sepsis.  Other possibility is chronic pancreatitis with incrementally developing dehydration and renal failure that became acute over the past several days or more.  Patient  presents with a rash of unknown etiology.  The only medication she describes taking are Zyrtec, Zoloft and losartan.  Upon arrival, rash had quite urticarial appearance symmetric distribution on her body (beneath both breasts, bilateral arms, groin and pannus fold and bilateral knees and medial legs).  Urticaria were fairly small and over a period of time appeared more confluent.  It has since improved and in a number of areas resolved.  Appears most consistent with an urticarial rash of unknown etiology.  Not suggestive of Stevens-Johnson's.  Patient has shown improvement over the course of treatment.  She will be admitted for definitive management.  ED Discharge Orders    None       Charlesetta Shanks, MD 01/01/18 [id number]

## 2018-01-01 NOTE — ED Notes (Signed)
Mali Settles Son [phone], Basilio Cairo Daughter in law [phone]

## 2018-01-02 ENCOUNTER — Inpatient Hospital Stay (HOSPITAL_COMMUNITY): Payer: Medicare Other

## 2018-01-02 ENCOUNTER — Encounter (HOSPITAL_COMMUNITY): Payer: Self-pay | Admitting: Radiology

## 2018-01-02 DIAGNOSIS — K859 Acute pancreatitis without necrosis or infection, unspecified: Secondary | ICD-10-CM

## 2018-01-02 DIAGNOSIS — J13 Pneumonia due to Streptococcus pneumoniae: Secondary | ICD-10-CM

## 2018-01-02 DIAGNOSIS — N179 Acute kidney failure, unspecified: Secondary | ICD-10-CM

## 2018-01-02 DIAGNOSIS — A419 Sepsis, unspecified organism: Principal | ICD-10-CM

## 2018-01-02 LAB — BASIC METABOLIC PANEL
Anion gap: 10 (ref 5–15)
BUN: 67 mg/dL — ABNORMAL HIGH (ref 6–20)
CALCIUM: 7.5 mg/dL — AB (ref 8.9–10.3)
CO2: 18 mmol/L — ABNORMAL LOW (ref 22–32)
CREATININE: 4.24 mg/dL — AB (ref 0.44–1.00)
Chloride: 114 mmol/L — ABNORMAL HIGH (ref 101–111)
GFR calc non Af Amer: 10 mL/min — ABNORMAL LOW (ref >60)
GFR, EST AFRICAN AMERICAN: 11 mL/min — AB (ref >60)
Glucose, Bld: 95 mg/dL (ref 65–99)
Potassium: 4.3 mmol/L (ref 3.5–5.1)
SODIUM: 142 mmol/L (ref 135–145)

## 2018-01-02 LAB — CBC
HCT: 38.2 % (ref 36.0–46.0)
Hemoglobin: 11.9 g/dL — ABNORMAL LOW (ref 12.0–15.0)
MCH: 29 pg (ref 26.0–34.0)
MCHC: 31.2 g/dL (ref 30.0–36.0)
MCV: 92.9 fL (ref 78.0–100.0)
PLATELETS: 240 10*3/uL (ref 150–400)
RBC: 4.11 MIL/uL (ref 3.87–5.11)
RDW: 15.3 % (ref 11.5–15.5)
WBC: 11.6 10*3/uL — AB (ref 4.0–10.5)

## 2018-01-02 LAB — HIV ANTIBODY (ROUTINE TESTING W REFLEX): HIV Screen 4th Generation wRfx: NONREACTIVE

## 2018-01-02 LAB — NA AND K (SODIUM & POTASSIUM), RAND UR
POTASSIUM UR: 12 mmol/L
SODIUM UR: 98 mmol/L

## 2018-01-02 LAB — PROCALCITONIN: PROCALCITONIN: 28.34 ng/mL

## 2018-01-02 LAB — CREATININE, URINE, RANDOM: Creatinine, Urine: 29.15 mg/dL

## 2018-01-02 LAB — MRSA PCR SCREENING: MRSA BY PCR: NEGATIVE

## 2018-01-02 LAB — STREP PNEUMONIAE URINARY ANTIGEN: Strep Pneumo Urinary Antigen: NEGATIVE

## 2018-01-02 MED ORDER — SODIUM CHLORIDE 0.9 % IV BOLUS
1000.0000 mL | Freq: Once | INTRAVENOUS | Status: AC
  Administered 2018-01-02: 1000 mL via INTRAVENOUS

## 2018-01-02 MED ORDER — LIP MEDEX EX OINT
TOPICAL_OINTMENT | CUTANEOUS | Status: DC | PRN
  Filled 2018-01-02: qty 7

## 2018-01-02 MED ORDER — SODIUM CHLORIDE 0.9 % IV BOLUS
500.0000 mL | Freq: Once | INTRAVENOUS | Status: AC
  Administered 2018-01-02: 500 mL via INTRAVENOUS

## 2018-01-02 MED ORDER — OXYCODONE HCL 5 MG PO TABS
5.0000 mg | ORAL_TABLET | ORAL | Status: DC | PRN
  Administered 2018-01-02 – 2018-01-03 (×3): 5 mg via ORAL
  Filled 2018-01-02 (×5): qty 1

## 2018-01-02 MED ORDER — ORAL CARE MOUTH RINSE
15.0000 mL | Freq: Two times a day (BID) | OROMUCOSAL | Status: DC
  Administered 2018-01-02 – 2018-01-05 (×7): 15 mL via OROMUCOSAL

## 2018-01-02 NOTE — Progress Notes (Signed)
PROGRESS NOTE [facility]   Linda Blackburn   VFI:[id number] DOB: 1951-01-08  DOA: 01/01/2018 PCP: [name], MD   Brief Narrative:  Linda Blackburn is a 67 year old female with medical history significant of recurrent pancreatitis, hypertension, and anxiety/depression.  Patient presented to the emergency department complaining of generalized weakness, abdominal discomfort, nausea and vomiting.  Upon ED evaluation patient was found to have elevated WBC, chest x-ray suspicious for pneumonia and abdominal CT scan concerning for acute pancreatitis.  Patient was admitted with working diagnosis of sepsis due to pneumonia and possible acute exacerbations of chronic pancreatitis.  Subjective: Patient seen and examined, she reports that she fell yesterday prior to coming to the hospital.  Report generalized weakness in the upper and lower extremity for which she can't bear weight.  She reported that her weakness has slightly improved from yesterday but never been "like this".  Poor historian.  Patient afebrile, feeling some appetite and denies abdominal pain.  Assessment & Plan: Sepsis secondary to pneumonia  Sepsis generally improved, initially treated with vancomycin and Zosyn, transitioned to Rocephin, azithromycin.  Continue current regimen for now.  Procalcitonin elevated.  Trend WBC and follow up blood cultures.  MRSA PCR negative.  Acute recurrent pancreatitis Unclear etiology, having recurrent pancreatitis for last 20 years.  Lipase within normal limits but imaging study report inflammation surrounding the pancreas suspicious for acute pancreatitis. Advance diet as tolerated, pain med as needed.  S/p cholecystectomy.   AKI Felt to be related to dehydration from sepsis, however FENa 10% consistent with intrinsic issue. Creatinine improving after hydration, will obtain renal ultrasound. Continue IV fluids for now.  No signs of fluid overload, good urine output.  Monitor renal  function in a.m.  Hypotension Improving with IV fluid Hold BP medications and continue aggressive hydration.  Depression/anxiety Stable continue antidepressants  Generalized weakness Unclear etiology at this time, suspected to be related to sepsis and renal failure, however patient with severe upper and lower extremity weakness.  Will obtain CT head and CT C-spine. PT   DVT prophylaxis: Heparin SQ Code Status: Full code Family Communication: None at bedside  Disposition Plan: To be determined   Consultants:   None  Procedures:   None  Antimicrobials: Anti-infectives (From admission, onward)   Start     Dose/Rate Route Frequency Ordered Stop   01/01/18 2000  cefTRIAXone (ROCEPHIN) 1 g in sodium chloride 0.9 % 100 mL IVPB     1 g 200 mL/hr over 30 Minutes Intravenous Every 24 hours 01/01/18 1928 01/08/18 1959   01/01/18 2000  azithromycin (ZITHROMAX) 500 mg in sodium chloride 0.9 % 250 mL IVPB     500 mg 250 mL/hr over 60 Minutes Intravenous Every 24 hours 01/01/18 1928 01/08/18 1959   01/01/18 1200  vancomycin (VANCOCIN) 1,500 mg in sodium chloride 0.9 % 500 mL IVPB     1,500 mg 250 mL/hr over 120 Minutes Intravenous  Once 01/01/18 1151 01/01/18 1630   01/01/18 1145  piperacillin-tazobactam (ZOSYN) IVPB 3.375 g     3.375 g 100 mL/hr over 30 Minutes Intravenous  Once 01/01/18 1135 01/01/18 1242   01/01/18 1145  vancomycin (VANCOCIN) IVPB 1000 mg/200 mL premix  Status:  Discontinued     1,000 mg 200 mL/hr over 60 Minutes Intravenous  Once 01/01/18 1135 01/01/18 1152         Objective: Vitals:   01/02/18 0400 01/02/18 0600 01/02/18 0630 01/02/18 0744  BP: (!) 90/47 (!) 99/57 (!) 101/55   Pulse: 95 93  90   Resp: (!) 23 (!) 25 18   Temp: 97.8 F (36.6 C)   98.4 F (36.9 C)  TempSrc: Oral   Oral  SpO2: 99% 98% 98%   Weight:      Height:        Intake/Output Summary (Last 24 hours) at 01/02/2018 0907 Last data filed at 01/02/2018 0600 Gross per 24 hour  Intake  5462.5 ml  Output 850 ml  Net 4612.5 ml   Filed Weights   01/01/18 1218 01/01/18 2055  Weight: 102.1 kg (225 lb) 101.6 kg (223 lb 15.8 oz)    Examination:  General exam: NAD  HEENT: OP moist and clear Respiratory system: Clear to auscultation. No wheezes,crackle or rhonchi Cardiovascular system: S1 & S2 heard, RRR. No JVD, murmurs, rubs or gallops Gastrointestinal system: Abdomen soft, mild epigastric tenderness, no rebound. Central nervous system: Alert and oriented. No focal neurological deficits. Extremities: Decrease ROM in all 4 extremities, strength 3/5, multiple points tender to palpation.  Normal sensation Skin: No rashes Psychiatry: Mood & affect flat and guarded   Data Reviewed: I have personally reviewed following labs and imaging studies  CBC: Recent Labs  Lab 01/01/18 1207 01/01/18 1335 01/02/18 0258  WBC 14.1*  --  11.6*  NEUTROABS 12.3  --   --   HGB 11.6* 11.2* 11.9*  HCT 35.8* 33.0* 38.2  MCV 91.8  --  92.9  PLT 202  --  332   Basic Metabolic Panel: Recent Labs  Lab 01/01/18 1207 01/01/18 1335 01/02/18 0258  NA 133* 134* 142  K 4.2 4.2 4.3  CL 100* 106 114*  CO2 18*  --  18*  GLUCOSE 94 89 95  BUN 76* 63* 67*  CREATININE 6.33* 6.40* 4.24*  CALCIUM 8.4*  --  7.5*   GFR: Estimated Creatinine Clearance: 13.7 mL/min (A) (by C-G formula based on SCr of 4.24 mg/dL (H)). Liver Function Tests: Recent Labs  Lab 01/01/18 1207  AST 56*  ALT 44  ALKPHOS 69  BILITOT 2.2*  PROT 6.4*  ALBUMIN 2.9*   Recent Labs  Lab 01/01/18 1207  LIPASE 23   No results for input(s): AMMONIA in the last 168 hours. Coagulation Profile: Recent Labs  Lab 01/01/18 1207  INR 1.19   Cardiac Enzymes: Recent Labs  Lab 01/01/18 1207  CKTOTAL 1,934*   BNP (last 3 results) No results for input(s): PROBNP in the last 8760 hours. HbA1C: No results for input(s): HGBA1C in the last 72 hours. CBG: Recent Labs  Lab 01/01/18 1136  GLUCAP 78   Lipid  Profile: No results for input(s): CHOL, HDL, LDLCALC, TRIG, CHOLHDL, LDLDIRECT in the last 72 hours. Thyroid Function Tests: No results for input(s): TSH, T4TOTAL, FREET4, T3FREE, THYROIDAB in the last 72 hours. Anemia Panel: No results for input(s): VITAMINB12, FOLATE, FERRITIN, TIBC, IRON, RETICCTPCT in the last 72 hours. Sepsis Labs: Recent Labs  Lab 01/01/18 1335  LATICACIDVEN 1.96*    Recent Results (from the past 240 hour(s))  MRSA PCR Screening     Status: None   Collection Time: 01/01/18 10:04 PM  Result Value Ref Range Status   MRSA by PCR NEGATIVE NEGATIVE Final    Comment:        The GeneXpert MRSA Assay (FDA approved for NASAL specimens only), is one component of a comprehensive MRSA colonization surveillance program. It is not intended to diagnose MRSA infection nor to guide or monitor treatment for MRSA infections. Performed at [facility], [place]  [address] [place], [place] [address]       Radiology Studies: Ct Abdomen Pelvis Wo Contrast  Result Date: 01/01/2018 CLINICAL DATA:  Abdominal distention EXAM: CT ABDOMEN AND PELVIS WITHOUT CONTRAST TECHNIQUE: Multidetector CT imaging of the abdomen and pelvis was performed following the standard protocol without IV contrast. COMPARISON:  03/21/2016 FINDINGS: Lower chest:  Lower lobe atelectasis, multi segment on the left. Hepatobiliary: Possible hepatic steatosis.Cholecystectomy. No visible choledocholithiasis. Pancreas: Diffuse expansion and peripancreatic edema. Gas at the level of the head is stable and attributed to a duodenal diverticulum. No gross fluid collection. Spleen: Unremarkable. Adrenals/Urinary Tract: Negative adrenals. No hydronephrosis or stone. Unremarkable bladder. Stomach/Bowel: No obstruction. Appendectomy. Sigmoid diverticulosis. Vascular/Lymphatic: Mild atherosclerotic calcification. No mass or adenopathy. Reproductive:Hysterectomy Other: No ascites or pneumoperitoneum.  Musculoskeletal: Degenerative changes without acute or aggressive finding. IMPRESSION: 1. Acute pancreatitis. 2. Multi segment atelectasis. 3. Cholecystectomy Electronically Signed   By: Monte Fantasia M.D.   On: 01/01/2018 15:31   Dg Chest Port 1 View  Result Date: 01/01/2018 CLINICAL DATA:  Chest pain EXAM: PORTABLE CHEST 1 VIEW COMPARISON:  March 21, 2016 FINDINGS: There is airspace consolidation in the left lower lobe with small left pleural effusion. The lungs elsewhere are clear. Heart is mildly enlarged with pulmonary vascularity within normal limits. No adenopathy. There is aortic atherosclerosis. No evident bone lesions. IMPRESSION: Left lower lobe airspace consolidation with small left pleural effusion. Lungs elsewhere clear. Heart mildly enlarged, stable. There is aortic atherosclerosis. Aortic Atherosclerosis (ICD10-I70.0). Electronically Signed   By: Lowella Grip III M.D.   On: 01/01/2018 13:02      Scheduled Meds: . heparin  5,000 Units Subcutaneous Q8H  . mouth rinse  15 mL Mouth Rinse BID  . sertraline  100 mg Oral Daily   Continuous Infusions: . azithromycin Stopped (01/01/18 2237)  . cefTRIAXone (ROCEPHIN)  IV Stopped (01/01/18 2311)  . dextrose 5 % and 0.45 % NaCl with KCl 20 mEq/L 125 mL/hr at 01/02/18 0646     LOS: 1 day    Time spent: Total of 35 minutes spent with pt, greater than 50% of which was spent in discussion of  treatment, counseling and coordination of care   Chipper Oman, MD Pager: Text Page via [email or web address]   If 7PM-7AM, please contact night-coverage [email or web address] 01/02/2018, 9:07 AM   Note - This record has been created using [employer]. Chart creation errors have been sought, but may not always have been located. Such creation errors do not reflect on the standard of medical care.

## 2018-01-03 ENCOUNTER — Inpatient Hospital Stay (HOSPITAL_COMMUNITY): Payer: Medicare Other

## 2018-01-03 ENCOUNTER — Encounter (HOSPITAL_COMMUNITY): Payer: Self-pay

## 2018-01-03 LAB — CBC
HEMATOCRIT: 34.2 % — AB (ref 36.0–46.0)
HEMOGLOBIN: 11.1 g/dL — AB (ref 12.0–15.0)
MCH: 29.6 pg (ref 26.0–34.0)
MCHC: 32.5 g/dL (ref 30.0–36.0)
MCV: 91.2 fL (ref 78.0–100.0)
Platelets: 234 10*3/uL (ref 150–400)
RBC: 3.75 MIL/uL — ABNORMAL LOW (ref 3.87–5.11)
RDW: 15.6 % — AB (ref 11.5–15.5)
WBC: 9.6 10*3/uL (ref 4.0–10.5)

## 2018-01-03 LAB — BASIC METABOLIC PANEL
ANION GAP: 9 (ref 5–15)
BUN: 51 mg/dL — AB (ref 6–20)
CO2: 19 mmol/L — AB (ref 22–32)
Calcium: 8.4 mg/dL — ABNORMAL LOW (ref 8.9–10.3)
Chloride: 114 mmol/L — ABNORMAL HIGH (ref 101–111)
Creatinine, Ser: 2.26 mg/dL — ABNORMAL HIGH (ref 0.44–1.00)
GFR calc Af Amer: 24 mL/min — ABNORMAL LOW (ref >60)
GFR, EST NON AFRICAN AMERICAN: 21 mL/min — AB (ref >60)
GLUCOSE: 96 mg/dL (ref 65–99)
POTASSIUM: 3.7 mmol/L (ref 3.5–5.1)
Sodium: 142 mmol/L (ref 135–145)

## 2018-01-03 MED ORDER — GABAPENTIN 300 MG PO CAPS
300.0000 mg | ORAL_CAPSULE | Freq: Three times a day (TID) | ORAL | Status: DC
  Administered 2018-01-03 – 2018-01-05 (×7): 300 mg via ORAL
  Filled 2018-01-03 (×7): qty 1

## 2018-01-03 MED ORDER — ONDANSETRON HCL 4 MG/2ML IJ SOLN
4.0000 mg | Freq: Four times a day (QID) | INTRAMUSCULAR | Status: DC | PRN
  Administered 2018-01-03 – 2018-01-04 (×2): 4 mg via INTRAVENOUS
  Filled 2018-01-03 (×2): qty 2

## 2018-01-03 NOTE — Progress Notes (Signed)
PROGRESS NOTE [facility]   Victorine Mcnee   WEX:[id number] DOB: 08/17/1951  DOA: 01/01/2018 PCP: [name], MD   Brief Narrative:  Linda Blackburn is a 67 year old female with medical history significant of recurrent pancreatitis, hypertension, and anxiety/depression.  Patient presented to the emergency department complaining of generalized weakness, abdominal discomfort, nausea and vomiting.  Upon ED evaluation patient was found to have elevated WBC, chest x-ray suspicious for pneumonia and abdominal CT scan concerning for acute pancreatitis.  Patient was admitted with working diagnosis of sepsis due to pneumonia and possible acute exacerbations of chronic pancreatitis.  Subjective: Patient seen and examined, feeling significantly better.  No acute events overnight.  Patient is able to move all extremities with less difficulty this morning.  Still feels weak, pain has improved.  Assessment & Plan: Sepsis secondary to pneumonia  Sepsis generally improved, initially treated with vancomycin and Zosyn, transitioned to Rocephin, azithromycin.  Continue current regimen for now.  Procalcitonin elevated, repeat in a.m. to monitor therapeutic treatment.  WBC improving, blood cultures negative so far.  Acute recurrent pancreatitis Unclear etiology, having recurrent pancreatitis for last 20 years.  Lipase within normal limits but imaging study report inflammation surrounding the pancreas suspicious for acute pancreatitis. Advance diet as tolerated, pain med as needed.  S/p cholecystectomy many years ago  AKI Felt to be related to dehydration from sepsis, however FENa 10% consistent with intrinsic issue. Creatinine continues to improve, renal ultrasound negative.  Will hold IV fluids as patient have some lower and upper extremity depending edema. Encourage oral hydration, good urine output.  Monitor renal function in a.m.  Hypotension Improved after IV hydration BP medications on hold,  will slowly if blood pressure start to increase.  Depression/anxiety Stable continue antidepressants  Generalized weakness CT head negative for stroke, multi-level degenerative changes of the cervical spine with significant foraminal stenosis.  Case was discussed with neurology recommending MRI of C-spine.  Will start gabapentin as well.  Patient symptoms are currently improving this morning.  PT evaluation  DVT prophylaxis: Heparin SQ Code Status: Full code Family Communication: None at bedside  Disposition Plan: Transferred to [facility]   Consultants:   None  Procedures:   None  Antimicrobials: Anti-infectives (From admission, onward)   Start     Dose/Rate Route Frequency Ordered Stop   01/01/18 2000  cefTRIAXone (ROCEPHIN) 1 g in sodium chloride 0.9 % 100 mL IVPB     1 g 200 mL/hr over 30 Minutes Intravenous Every 24 hours 01/01/18 1928 01/08/18 1959   01/01/18 2000  azithromycin (ZITHROMAX) 500 mg in sodium chloride 0.9 % 250 mL IVPB     500 mg 250 mL/hr over 60 Minutes Intravenous Every 24 hours 01/01/18 1928 01/08/18 1959   01/01/18 1200  vancomycin (VANCOCIN) 1,500 mg in sodium chloride 0.9 % 500 mL IVPB     1,500 mg 250 mL/hr over 120 Minutes Intravenous  Once 01/01/18 1151 01/01/18 1630   01/01/18 1145  piperacillin-tazobactam (ZOSYN) IVPB 3.375 g     3.375 g 100 mL/hr over 30 Minutes Intravenous  Once 01/01/18 1135 01/01/18 1242   01/01/18 1145  vancomycin (VANCOCIN) IVPB 1000 mg/200 mL premix  Status:  Discontinued     1,000 mg 200 mL/hr over 60 Minutes Intravenous  Once 01/01/18 1135 01/01/18 1152         Objective: Vitals:   01/03/18 0400 01/03/18 0500 01/03/18 0600 01/03/18 0749  BP: 107/62 (!) 119/59 (!) 130/59   Pulse: 82 82 96   Resp:  15 18 (!) 22   Temp:    98 F (36.7 C)  TempSrc:    Oral  SpO2: 92% 92% 91%   Weight:      Height:        Intake/Output Summary (Last 24 hours) at 01/03/2018 0853 Last data filed at 01/03/2018 0600 Gross per 24  hour  Intake 2989.58 ml  Output 1850 ml  Net 1139.58 ml   Filed Weights   01/01/18 1218 01/01/18 2055  Weight: 102.1 kg (225 lb) 101.6 kg (223 lb 15.8 oz)    Examination:   General: Pt is alert, awake, not in acute distress Cardiovascular: RRR, S1/S2 +, no rubs, no gallops Respiratory: CTA bilaterally, no wheezing, no rhonchi Abdominal: Soft, NT, ND, bowel sounds +  Extremities: Strength 4/5, normal sensation.  Range of movement improving all extremities.  Less tender. Trace upper and lower extremity edema Neurology: Alert and oriented, no focal deficit, bilateral hand tremor Skin: No rashes Psychiatry: Mood and affect appropriate.   Data Reviewed: I have personally reviewed following labs and imaging studies  CBC: Recent Labs  Lab 01/01/18 1207 01/01/18 1335 01/02/18 0258 01/03/18 0621  WBC 14.1*  --  11.6* 9.6  NEUTROABS 12.3  --   --   --   HGB 11.6* 11.2* 11.9* 11.1*  HCT 35.8* 33.0* 38.2 34.2*  MCV 91.8  --  92.9 91.2  PLT 202  --  240 948   Basic Metabolic Panel: Recent Labs  Lab 01/01/18 1207 01/01/18 1335 01/02/18 0258 01/03/18 0621  NA 133* 134* 142 142  K 4.2 4.2 4.3 3.7  CL 100* 106 114* 114*  CO2 18*  --  18* 19*  GLUCOSE 94 89 95 96  BUN 76* 63* 67* 51*  CREATININE 6.33* 6.40* 4.24* 2.26*  CALCIUM 8.4*  --  7.5* 8.4*   GFR: Estimated Creatinine Clearance: 25.7 mL/min (A) (by C-G formula based on SCr of 2.26 mg/dL (H)). Liver Function Tests: Recent Labs  Lab 01/01/18 1207  AST 56*  ALT 44  ALKPHOS 69  BILITOT 2.2*  PROT 6.4*  ALBUMIN 2.9*   Recent Labs  Lab 01/01/18 1207  LIPASE 23   No results for input(s): AMMONIA in the last 168 hours. Coagulation Profile: Recent Labs  Lab 01/01/18 1207  INR 1.19   Cardiac Enzymes: Recent Labs  Lab 01/01/18 1207  CKTOTAL 1,934*   BNP (last 3 results) No results for input(s): PROBNP in the last 8760 hours. HbA1C: No results for input(s): HGBA1C in the last 72 hours. CBG: Recent  Labs  Lab 01/01/18 1136  GLUCAP 78   Lipid Profile: No results for input(s): CHOL, HDL, LDLCALC, TRIG, CHOLHDL, LDLDIRECT in the last 72 hours. Thyroid Function Tests: No results for input(s): TSH, T4TOTAL, FREET4, T3FREE, THYROIDAB in the last 72 hours. Anemia Panel: No results for input(s): VITAMINB12, FOLATE, FERRITIN, TIBC, IRON, RETICCTPCT in the last 72 hours. Sepsis Labs: Recent Labs  Lab 01/01/18 1335 01/02/18 0258  PROCALCITON  --  28.34  LATICACIDVEN 1.96*  --     Recent Results (from the past 240 hour(s))  Blood Culture (routine x 2)     Status: None (Preliminary result)   Collection Time: 01/01/18 11:49 AM  Result Value Ref Range Status   Specimen Description   Final    BLOOD LEFT HAND Performed at [facility] [address], [place], [place] [address]    Special Requests   Final    AEROBIC BOTTLE ONLY Blood Culture  adequate volume Performed at [facility] [address] [place], [place] [address]    Culture   Final    NO GROWTH < 24 HOURS Performed at [facility] [address], [place], [place] [address]    Report Status PENDING  Incomplete  Blood Culture (routine x 2)     Status: None (Preliminary result)   Collection Time: 01/01/18 12:08 PM  Result Value Ref Range Status   Specimen Description   Final    BLOOD RIGHT ANTECUBITAL Performed at [facility] [address] [place], [place] [address]    Special Requests   Final    BOTTLES DRAWN AEROBIC AND ANAEROBIC Blood Culture adequate volume Performed at [facility] [address], [place], [place] [address]    Culture   Final    NO GROWTH < 24 HOURS Performed at [facility] [address] [place], [place] [address]    Report Status PENDING  Incomplete  MRSA PCR Screening     Status: None   Collection Time: 01/01/18 10:04 PM  Result Value Ref Range Status   MRSA by PCR NEGATIVE NEGATIVE Final     Comment:        The GeneXpert MRSA Assay (FDA approved for NASAL specimens only), is one component of a comprehensive MRSA colonization surveillance program. It is not intended to diagnose MRSA infection nor to guide or monitor treatment for MRSA infections. Performed at [facility], [place] [address] [place], [place] [address]       Radiology Studies: Ct Abdomen Pelvis Wo Contrast  Result Date: 01/01/2018 CLINICAL DATA:  Abdominal distention EXAM: CT ABDOMEN AND PELVIS WITHOUT CONTRAST TECHNIQUE: Multidetector CT imaging of the abdomen and pelvis was performed following the standard protocol without IV contrast. COMPARISON:  03/21/2016 FINDINGS: Lower chest:  Lower lobe atelectasis, multi segment on the left. Hepatobiliary: Possible hepatic steatosis.Cholecystectomy. No visible choledocholithiasis. Pancreas: Diffuse expansion and peripancreatic edema. Gas at the level of the head is stable and attributed to a duodenal diverticulum. No gross fluid collection. Spleen: Unremarkable. Adrenals/Urinary Tract: Negative adrenals. No hydronephrosis or stone. Unremarkable bladder. Stomach/Bowel: No obstruction. Appendectomy. Sigmoid diverticulosis. Vascular/Lymphatic: Mild atherosclerotic calcification. No mass or adenopathy. Reproductive:Hysterectomy Other: No ascites or pneumoperitoneum. Musculoskeletal: Degenerative changes without acute or aggressive finding. IMPRESSION: 1. Acute pancreatitis. 2. Multi segment atelectasis. 3. Cholecystectomy Electronically Signed   By: Monte Fantasia M.D.   On: 01/01/2018 15:31   Ct Head Wo Contrast  Result Date: 01/02/2018 CLINICAL DATA:  Fall 12/28/2017. Radiculopathy. Unable to move head or neck. Trauma to head with loss of consciousness. Weakness in the upper and lower extremities. Pain with movement. EXAM: CT HEAD WITHOUT CONTRAST CT CERVICAL SPINE WITHOUT CONTRAST TECHNIQUE: Multidetector CT imaging of the head and cervical spine was  performed following the standard protocol without intravenous contrast. Multiplanar CT image reconstructions of the cervical spine were also generated. COMPARISON:  None. FINDINGS: CT HEAD FINDINGS Brain: No acute infarct, hemorrhage, or mass lesion is present. The ventricles are of normal size. No significant extraaxial fluid collection is present. No significant white matter disease is present. The brainstem and cerebellum are normal. Vascular: Atherosclerotic calcifications present within the cavernous internal carotid arteries bilaterally without a hyperdense vessel. Skull: Calvarium is intact. No focal lytic or blastic lesions are present. No significant extracranial soft tissue injury is present. Sinuses/Orbits: The paranasal sinuses and mastoid air cells are clear. Globes and orbits are within normal limits. CT  CERVICAL SPINE FINDINGS Alignment: AP alignment is anatomic. There straightening of the normal cervical lordosis. Skull base and vertebrae: The craniocervical junction is normal. No acute or healing fracture is present in the cervical spine. Vertebral body heights are maintained. Soft tissues and spinal canal: Soft tissues the neck are unremarkable. Disc levels: C2-3: Asymmetric right-sided uncovertebral spurring contributes to mild right foraminal narrowing. C3-4: Facet hypertrophy is worse on the left with mild left foraminal narrowing. C4-5: Severe right and moderate left foraminal stenosis is secondary to uncovertebral and facet disease. C5-6: Severe foraminal narrowing is worse right than left due to uncovertebral disease. C6-7: Severe left and mild right foraminal narrowing is secondary to uncovertebral disease. C7-T1: No focal stenosis. Upper chest: The lung apices are clear. The thoracic inlet is within normal limits. IMPRESSION: 1. No acute trauma to the head or cervical spine. 2. Atherosclerotic changes within the cavernous internal carotid arteries bilaterally without a hyperdense vessel. 3.  Multilevel degenerative changes of the cervical spine with significant foraminal stenosis bilaterally which could contribute to radicular symptoms. Electronically Signed   By: San Morelle M.D.   On: 01/02/2018 13:34   Ct Cervical Spine Wo Contrast  Result Date: 01/02/2018 CLINICAL DATA:  Fall 12/28/2017. Radiculopathy. Unable to move head or neck. Trauma to head with loss of consciousness. Weakness in the upper and lower extremities. Pain with movement. EXAM: CT HEAD WITHOUT CONTRAST CT CERVICAL SPINE WITHOUT CONTRAST TECHNIQUE: Multidetector CT imaging of the head and cervical spine was performed following the standard protocol without intravenous contrast. Multiplanar CT image reconstructions of the cervical spine were also generated. COMPARISON:  None. FINDINGS: CT HEAD FINDINGS Brain: No acute infarct, hemorrhage, or mass lesion is present. The ventricles are of normal size. No significant extraaxial fluid collection is present. No significant white matter disease is present. The brainstem and cerebellum are normal. Vascular: Atherosclerotic calcifications present within the cavernous internal carotid arteries bilaterally without a hyperdense vessel. Skull: Calvarium is intact. No focal lytic or blastic lesions are present. No significant extracranial soft tissue injury is present. Sinuses/Orbits: The paranasal sinuses and mastoid air cells are clear. Globes and orbits are within normal limits. CT CERVICAL SPINE FINDINGS Alignment: AP alignment is anatomic. There straightening of the normal cervical lordosis. Skull base and vertebrae: The craniocervical junction is normal. No acute or healing fracture is present in the cervical spine. Vertebral body heights are maintained. Soft tissues and spinal canal: Soft tissues the neck are unremarkable. Disc levels: C2-3: Asymmetric right-sided uncovertebral spurring contributes to mild right foraminal narrowing. C3-4: Facet hypertrophy is worse on the left  with mild left foraminal narrowing. C4-5: Severe right and moderate left foraminal stenosis is secondary to uncovertebral and facet disease. C5-6: Severe foraminal narrowing is worse right than left due to uncovertebral disease. C6-7: Severe left and mild right foraminal narrowing is secondary to uncovertebral disease. C7-T1: No focal stenosis. Upper chest: The lung apices are clear. The thoracic inlet is within normal limits. IMPRESSION: 1. No acute trauma to the head or cervical spine. 2. Atherosclerotic changes within the cavernous internal carotid arteries bilaterally without a hyperdense vessel. 3. Multilevel degenerative changes of the cervical spine with significant foraminal stenosis bilaterally which could contribute to radicular symptoms. Electronically Signed   By: San Morelle M.D.   On: 01/02/2018 13:34   US Renal  Result Date: 01/02/2018 CLINICAL DATA:  67 year old female with acute kidney injury. Dehydration. EXAM: RENAL / URINARY TRACT ULTRASOUND COMPLETE COMPARISON:  Noncontrast CT Abdomen and Pelvis 01/01/2018 and  earlier. FINDINGS: Right Kidney: Length: 12.2 centimeters. Echogenicity within normal limits. No mass or hydronephrosis visualized. Left Kidney: Length: 11.9 centimeters. Echogenicity within normal limits. No mass or hydronephrosis visualized. Bladder: Appears normal for degree of bladder distention. Other findings: Increased liver echogenicity raising the possibility of steatosis (image 4). IMPRESSION: Normal ultrasound appearance of both kidneys and the urinary bladder. Electronically Signed   By: Genevie Ann M.D.   On: 01/02/2018 14:14   Dg Chest Port 1 View  Result Date: 01/01/2018 CLINICAL DATA:  Chest pain EXAM: PORTABLE CHEST 1 VIEW COMPARISON:  March 21, 2016 FINDINGS: There is airspace consolidation in the left lower lobe with small left pleural effusion. The lungs elsewhere are clear. Heart is mildly enlarged with pulmonary vascularity within normal limits. No  adenopathy. There is aortic atherosclerosis. No evident bone lesions. IMPRESSION: Left lower lobe airspace consolidation with small left pleural effusion. Lungs elsewhere clear. Heart mildly enlarged, stable. There is aortic atherosclerosis. Aortic Atherosclerosis (ICD10-I70.0). Electronically Signed   By: Lowella Grip III M.D.   On: 01/01/2018 13:02    Scheduled Meds: . gabapentin  300 mg Oral TID  . heparin  5,000 Units Subcutaneous Q8H  . mouth rinse  15 mL Mouth Rinse BID  . sertraline  100 mg Oral Daily   Continuous Infusions: . azithromycin Stopped (01/02/18 2050)  . cefTRIAXone (ROCEPHIN)  IV Stopped (01/02/18 2019)  . dextrose 5 % and 0.45 % NaCl with KCl 20 mEq/L 125 mL/hr at 01/03/18 0307     LOS: 2 days    Time spent: Total of 35 minutes spent with pt, greater than 50% of which was spent in discussion of  treatment, counseling and coordination of care   Chipper Oman, MD Pager: Text Page via [email or web address]   If 7PM-7AM, please contact night-coverage [email or web address] 01/03/2018, 8:53 AM   Note - This record has been created using [employer]. Chart creation errors have been sought, but may not always have been located. Such creation errors do not reflect on the standard of medical care.

## 2018-01-03 NOTE — Evaluation (Signed)
Physical Therapy Evaluation Patient Details Name: Linda Blackburn MRN: [id number] DOB: 10-27-1950 Today's Date: 01/03/2018   History of Present Illness   67 year old female with medical history significant of recurrent pancreatitis, hypertension, and anxiety/depression.  Patient presented to the emergency department complaining of generalized weakness, abdominal discomfort, nausea and vomiting.  Dx of sepsis, pneumonia,  acute pancreatitis  Clinical Impression  Pt admitted with above diagnosis. Pt currently with functional limitations due to the deficits listed below (see PT Problem List). Mod assist bed mobility, min assist transfers. Pt ambulated 20' with RW, distance limited by fatigue, HR 107, SaO2 88% on room air. Pt lives alone and requires assist for mobility, ST-SNF recommended, pt agreeable.  Pt will benefit from skilled PT to increase their independence and safety with mobility to allow discharge to the venue listed below.       Follow Up Recommendations SNF;Supervision for mobility/OOB    Equipment Recommendations  Rolling walker with 5" wheels    Recommendations for Other Services       Precautions / Restrictions Precautions Precautions: Fall Precaution Comments: pt reports 2 falls in past 1 year, including 1 just PTA Restrictions Weight Bearing Restrictions: No      Mobility  Bed Mobility Overal bed mobility: Needs Assistance Bed Mobility: Supine to Sit     Supine to sit: Mod assist;HOB elevated     General bed mobility comments: assist to pivot hips to edge of bed and to raise trunk  Transfers Overall transfer level: Needs assistance Equipment used: Rolling walker (2 wheeled) Transfers: Sit to/from Stand Sit to Stand: Min assist         General transfer comment: VCs hand placement  Ambulation/Gait Ambulation/Gait assistance: Min guard Ambulation Distance (Feet): 20 Feet Assistive device: Rolling walker (2 wheeled) Gait Pattern/deviations: Decreased  stride length Gait velocity: decr   General Gait Details: slow but steady, decr step length, VCs for positioning in RW and to lift head; SaO2 88% on room air walking ,HR 107, distance limited by fatigue  Stairs            Wheelchair Mobility    Modified Rankin (Stroke Patients Only)       Balance Overall balance assessment: History of Falls;Modified Independent                                           Pertinent Vitals/Pain Pain Assessment: 0-10 Pain Score: 4  Pain Location: R thigh with knee extension Pain Descriptors / Indicators: Sore Pain Intervention(s): Limited activity within patient's tolerance;Monitored during session    Home Living Family/patient expects to be discharged to:: Private residence Living Arrangements: Alone Available Help at Discharge: Skilled Nursing Facility(sons are in [facility] and [place], no local family) Type of Home: Apartment Home Access: Stairs to enter Entrance Stairs-Rails: None Entrance Stairs-Number of Steps: 1 Home Layout: One level Home Equipment: None      Prior Function Level of Independence: Independent               Hand Dominance        Extremity/Trunk Assessment   Upper Extremity Assessment Upper Extremity Assessment: Generalized weakness    Lower Extremity Assessment Lower Extremity Assessment: Overall WFL for tasks assessed(B knee ext 4/5)    Cervical / Trunk Assessment Cervical / Trunk Assessment: Normal  Communication   Communication: No difficulties  Cognition Arousal/Alertness: Awake/alert Behavior During Therapy: WFL for  tasks assessed/performed Overall Cognitive Status: Within Functional Limits for tasks assessed                                        General Comments      Exercises     Assessment/Plan    PT Assessment Patient needs continued PT services  PT Problem List Decreased activity tolerance;Decreased mobility;Pain       PT Treatment Interventions  DME instruction;Gait training;Therapeutic activities;Functional mobility training;Patient/family education    PT Goals (Current goals can be found in the Care Plan section)  Acute Rehab PT Goals Patient Stated Goal: to get strong enough to go home PT Goal Formulation: With patient Time For Goal Achievement: 01/17/18 Potential to Achieve Goals: Good    Frequency Min 3X/week   Barriers to discharge        Co-evaluation               AM-PAC PT "6 Clicks" Daily Activity  Outcome Measure Difficulty turning over in bed (including adjusting bedclothes, sheets and blankets)?: A Little Difficulty moving from lying on back to sitting on the side of the bed? : Unable Difficulty sitting down on and standing up from a chair with arms (e.g., wheelchair, bedside commode, etc,.)?: Unable Help needed moving to and from a bed to chair (including a wheelchair)?: A Little Help needed walking in hospital room?: A Little Help needed climbing 3-5 steps with a railing? : A Lot 6 Click Score: 13    End of Session Equipment Utilized During Treatment: Gait belt Activity Tolerance: Patient tolerated treatment well Patient left: in chair;with call bell/phone within reach Nurse Communication: Mobility status PT Visit Diagnosis: Difficulty in walking, not elsewhere classified (R26.2);Pain Pain - Right/Left: Right Pain - part of body: Leg    Time: 3845-3646 PT Time Calculation (min) (ACUTE ONLY): 31 min   Charges:   PT Evaluation $PT Eval Low Complexity: 1 Low PT Treatments $Gait Training: 8-22 mins   PT G Codes:          Philomena Doheny 01/03/2018, 10:17 AM [phone]

## 2018-01-04 LAB — COMPREHENSIVE METABOLIC PANEL
ALBUMIN: 2.2 g/dL — AB (ref 3.5–5.0)
ALK PHOS: 87 U/L (ref 38–126)
ALT: 53 U/L (ref 14–54)
AST: 83 U/L — AB (ref 15–41)
Anion gap: 9 (ref 5–15)
BILIRUBIN TOTAL: 0.8 mg/dL (ref 0.3–1.2)
BUN: 39 mg/dL — AB (ref 6–20)
CALCIUM: 8.6 mg/dL — AB (ref 8.9–10.3)
CO2: 21 mmol/L — ABNORMAL LOW (ref 22–32)
Chloride: 112 mmol/L — ABNORMAL HIGH (ref 101–111)
Creatinine, Ser: 1.5 mg/dL — ABNORMAL HIGH (ref 0.44–1.00)
GFR calc Af Amer: 40 mL/min — ABNORMAL LOW (ref >60)
GFR calc non Af Amer: 34 mL/min — ABNORMAL LOW (ref >60)
GLUCOSE: 81 mg/dL (ref 65–99)
Potassium: 3.5 mmol/L (ref 3.5–5.1)
Sodium: 142 mmol/L (ref 135–145)
TOTAL PROTEIN: 5.8 g/dL — AB (ref 6.5–8.1)

## 2018-01-04 LAB — CK: Total CK: 1149 U/L — ABNORMAL HIGH (ref 38–234)

## 2018-01-04 LAB — PROCALCITONIN: Procalcitonin: 7.5 ng/mL

## 2018-01-04 LAB — MAGNESIUM: Magnesium: 1.5 mg/dL — ABNORMAL LOW (ref 1.7–2.4)

## 2018-01-04 MED ORDER — POTASSIUM CHLORIDE CRYS ER 20 MEQ PO TBCR
40.0000 meq | EXTENDED_RELEASE_TABLET | Freq: Once | ORAL | Status: AC
  Administered 2018-01-04: 40 meq via ORAL
  Filled 2018-01-04: qty 2

## 2018-01-04 MED ORDER — MAGNESIUM SULFATE 2 GM/50ML IV SOLN
2.0000 g | Freq: Once | INTRAVENOUS | Status: AC
  Administered 2018-01-04: 2 g via INTRAVENOUS
  Filled 2018-01-04: qty 50

## 2018-01-04 NOTE — Plan of Care (Signed)
  Problem: Activity: Goal: Risk for activity intolerance will decrease Outcome: Progressing   Problem: Nutrition: Goal: Adequate nutrition will be maintained Outcome: Progressing   Problem: Coping: Goal: Level of anxiety will decrease Outcome: Progressing   Problem: Elimination: Goal: Will not experience complications related to bowel motility Outcome: Progressing Goal: Will not experience complications related to urinary retention Outcome: Progressing   Problem: Pain Managment: Goal: General experience of comfort will improve Outcome: Progressing   Problem: Skin Integrity: Goal: Risk for impaired skin integrity will decrease Outcome: Progressing

## 2018-01-04 NOTE — [place] FL2 (Signed)
[place] MEDICAID FL2 LEVEL OF CARE SCREENING TOOL     IDENTIFICATION  Patient Name: Linda Blackburn Birthdate: 01-26-1951 Sex: female Admission Date (Current Location): 01/01/2018  [facility] and [place] Number:  Producer, television/film/video and Address:  [facility],  [address] [place], [place] [address]      Provider Number: [id number]  Attending Physician Name and Address:  Randel Pigg, Dorma Russell, MD  Relative Name and Phone Number:       Current Level of Care: Hospital Recommended Level of Care: Skilled Nursing Facility Prior Approval Number:    Date Approved/Denied: 01/04/18 PASRR Number: [id number]  Discharge Plan: SNF    Current Diagnoses: Patient Active Problem List   Diagnosis Date Noted  . Sepsis (HCC) 01/01/2018  . Alternating constipation and diarrhea 09/26/2016  . Obesity (BMI 30-39.9) 07/20/2014  . Chronic insomnia 06/16/2014  . Hypokalemia 06/10/2014  . Volume overload 06/10/2014  . HCAP (healthcare-associated pneumonia) 06/10/2014  . UTI (urinary tract infection) 06/10/2014  . Leukocytosis 06/10/2014  . Acute respiratory failure with hypoxia (HCC) 06/10/2014  . Acute recurrent pancreatitis 06/06/2014  . Pancreatitis 06/06/2014  . Hypertension 09/11/2012  . Adjustment disorder with mixed anxiety and depressed mood 09/11/2012  . Pancreas divisum 01/18/2011  . URI 09/02/2010  . Depression, recurrent (HCC) 04/01/2010  . PANCREATITIS, HX OF 04/01/2010    Orientation RESPIRATION BLADDER Height & Weight     Self, Time, Situation, Place  Normal Continent, External catheter Weight: 226 lb 10.1 oz (102.8 kg) Height:  5\' 2"  (157.5 cm)  BEHAVIORAL SYMPTOMS/MOOD NEUROLOGICAL BOWEL NUTRITION STATUS      Continent Diet(see dc summary )  AMBULATORY STATUS COMMUNICATION OF NEEDS Skin   Extensive Assist Verbally Normal                       Personal Care Assistance Level of Assistance  Bathing, Feeding, Dressing Bathing Assistance: Limited  assistance Feeding assistance: Independent Dressing Assistance: Limited assistance     Functional Limitations Info  Sight, Hearing, Speech Sight Info: Adequate Hearing Info: Adequate Speech Info: Adequate    SPECIAL CARE FACTORS FREQUENCY  PT (By licensed PT), OT (By licensed OT)     PT Frequency: 5x/week OT Frequency: 5x/week            Contractures Contractures Info: Not present    Additional Factors Info  Code Status, Allergies Code Status Info: Full Allergies Info: Other, Buprenorphine Hcl, Codeine, Morphine And Related           Current Medications (01/04/2018):  This is the current hospital active medication list Current Facility-Administered Medications  Medication Dose Route Frequency Provider Last Rate Last Dose  . azithromycin (ZITHROMAX) 500 mg in sodium chloride 0.9 % 250 mL IVPB  500 mg Intravenous Q24H Randel Pigg, Dorma Russell, MD   Stopped at 01/03/18 2301  . cefTRIAXone (ROCEPHIN) 1 g in sodium chloride 0.9 % 100 mL IVPB  1 g Intravenous Q24H Randel Pigg, Dorma Russell, MD   Stopped at 01/03/18 2113  . gabapentin (NEURONTIN) capsule 300 mg  300 mg Oral TID Randel Pigg, Dorma Russell, MD   300 mg at 01/04/18 0825  . heparin injection 5,000 Units  5,000 Units Subcutaneous Q8H Randel Pigg, Dorma Russell, MD   5,000 Units at 01/04/18 [phone]  . lip balm (CARMEX) ointment   Topical PRN Randel Pigg, Dorma Russell, MD      . MEDLINE mouth rinse  15 mL Mouth Rinse BID Randel Pigg, Dorma Russell, MD   15 mL at 01/04/18 [phone]  .  ondansetron (ZOFRAN) injection 4 mg  4 mg Intravenous Q6H PRN Bodenheimer, Charles A, NP   4 mg at 01/04/18 0825  . oxyCODONE (Oxy IR/ROXICODONE) immediate release tablet 5 mg  5 mg Oral Q4H PRN Randel Pigg, Dorma Russell, MD   5 mg at 01/03/18 0306  . sertraline (ZOLOFT) tablet 100 mg  100 mg Oral Daily Randel Pigg, Dorma Russell, MD   100 mg at 01/04/18 0825  . zolpidem (AMBIEN) tablet 5 mg  5 mg Oral QHS PRN Lenox Ponds, MD         Discharge Medications: Please see discharge summary  for a list of discharge medications.  Relevant Imaging Results:  Relevant Lab Results:   Additional Information ssn: [id number]  Coralyn Helling, LCSW

## 2018-01-04 NOTE — Progress Notes (Signed)
Physical Therapy Treatment Patient Details Name: Linda Blackburn MRN: [id number] DOB: 02-Nov-1950 Today's Date: 01/04/2018    History of Present Illness  67 year old female with medical history significant of recurrent pancreatitis, hypertension, and anxiety/depression.  Patient presented to the emergency department complaining of generalized weakness, abdominal discomfort, nausea and vomiting.  Dx of sepsis, pneumonia,  acute pancreatitis    PT Comments    Progressing with mobility. Pt demonstrated improved activity tolerance on today. Will continue to follow. Plan is for ST rehab prior to pt returning home.    Follow Up Recommendations  SNF     Equipment Recommendations  Rolling walker with 5" wheels    Recommendations for Other Services       Precautions / Restrictions Precautions Precautions: Fall Precaution Comments: pt reports 2 falls in past 1 year, including 1 just PTA Restrictions Weight Bearing Restrictions: No    Mobility  Bed Mobility Overal bed mobility: Needs Assistance Bed Mobility: Supine to Sit;Sit to Supine     Supine to sit: Supervision Sit to supine: Supervision   General bed mobility comments: for safety  Transfers Overall transfer level: Needs assistance Equipment used: None Transfers: Sit to/from Stand Sit to Stand: Min assist         General transfer comment: Assist to steady.   Ambulation/Gait Ambulation/Gait assistance: Min guard Ambulation Distance (Feet): 200 Feet Assistive device: Rolling walker (2 wheeled) Gait Pattern/deviations: Step-through pattern;Decreased stride length     General Gait Details: O2 sats 92% on RA, dyspnea 2/4 with activity. Close guard for safety. Pt tolerated distance well.    Stairs             Wheelchair Mobility    Modified Rankin (Stroke Patients Only)       Balance                                            Cognition Arousal/Alertness: Awake/alert Behavior During  Therapy: WFL for tasks assessed/performed Overall Cognitive Status: Within Functional Limits for tasks assessed                                        Exercises      General Comments        Pertinent Vitals/Pain Pain Assessment: No/denies pain    Home Living                      Prior Function            PT Goals (current goals can now be found in the care plan section) Progress towards PT goals: Progressing toward goals    Frequency    Min 3X/week      PT Plan Current plan remains appropriate    Co-evaluation              AM-PAC PT "6 Clicks" Daily Activity  Outcome Measure  Difficulty turning over in bed (including adjusting bedclothes, sheets and blankets)?: None Difficulty moving from lying on back to sitting on the side of the bed? : None Difficulty sitting down on and standing up from a chair with arms (e.g., wheelchair, bedside commode, etc,.)?: A Little Help needed moving to and from a bed to chair (including a wheelchair)?: A Little Help needed walking in hospital room?: A  Little Help needed climbing 3-5 steps with a railing? : A Little 6 Click Score: 20    End of Session Equipment Utilized During Treatment: Gait belt Activity Tolerance: Patient tolerated treatment well Patient left: in bed;with call bell/phone within reach   PT Visit Diagnosis: Muscle weakness (generalized) (M62.81);Difficulty in walking, not elsewhere classified (R26.2)     Time: 4492-0100 PT Time Calculation (min) (ACUTE ONLY): 19 min  Charges:  $Gait Training: 8-22 mins                    G Codes:          Weston Anna, MPT Pager: [phone]

## 2018-01-04 NOTE — Clinical Social Work Note (Signed)
Clinical Social Work Assessment  Patient Details  Name: Linda Blackburn MRN: [id number] Date of Birth: 01/17/1951  Date of referral:  01/04/18               Reason for consult:  Facility Placement                Permission sought to share information with:  Facility Art therapist granted to share information::  Yes, Verbal Permission Granted  Name::        Agency::     Relationship::     Contact Information:     Housing/Transportation Living arrangements for the past 2 months:  Apartment Source of Information:  Patient Patient Interpreter Needed:  None Criminal Activity/Legal Involvement Pertinent to Current Situation/Hospitalization:  No - Comment as needed Significant Relationships:  Adult Children, Friend Lives with:  Self Do you feel safe going back to the place where you live?  (PT recommending SNF) Need for family participation in patient care:  No (Coment)  Care giving concerns:  Patient from home alone. Patient reported that prior to hospitalization patient was independent with ambulation and ADLs. PT recommending SNF for ST rehab.    Social Worker assessment / plan:  CSW spoke with patient at bedside at regarding PT recommendation for SNF. CSW explained SNF placement process and medicare coverage. Patient reported that she is agreeable to SNF. CSW agreed to complete FL2 and follow up with bed offers.  CSW completed patient's FL2. CSW will follow up with bed offers.   CSW will continue to follow and assist with discharge planning.  Employment status:  Retired Forensic scientist:  Medicare PT Recommendations:  [place] / Referral to community resources:  [place]  Patient/Family's Response to care:  Patient appreciative of CSW assistance with discharge planning.  Patient/Family's Understanding of and Emotional Response to Diagnosis, Current Treatment, and Prognosis:  Patient presented calm and verbalized  understanding of current treatment plan. Patient verbalized plan to dc to SNF for ST rehab prior to returning home. Patient hopeful to regain strength in ST rehab and return to prior level of functioning.   Emotional Assessment Appearance:  Appears stated age Attitude/Demeanor/Rapport:  Other(Cooperative) Affect (typically observed):  Appropriate, Calm Orientation:  Oriented to Self, Oriented to Place, Oriented to  Time, Oriented to Situation Alcohol / Substance use:  Not Applicable Psych involvement (Current and /or in the community):  No (Comment)  Discharge Needs  Concerns to be addressed:  Care Coordination Readmission within the last 30 days:  No Current discharge risk:  Lives alone, Physical Impairment Barriers to Discharge:  Continued Medical Work up   [employer], LCSW 01/04/2018, 2:52 PM

## 2018-01-04 NOTE — Progress Notes (Signed)
PROGRESS NOTE [facility]   Linda Blackburn   WUJ:[id number] DOB: 03-19-1951  DOA: 01/01/2018 PCP: Linda Post, MD   Brief Narrative:  Linda Blackburn is a 67 year old female with medical history significant of recurrent pancreatitis, hypertension, and anxiety/depression.  Patient presented to the emergency department complaining of generalized weakness, abdominal discomfort, nausea and vomiting.  Upon ED evaluation patient was found to have elevated WBC, chest x-ray suspicious for pneumonia and abdominal CT scan concerning for acute pancreatitis.  Patient was admitted with working diagnosis of sepsis due to pneumonia and possible acute exacerbations of chronic pancreatitis.  Subjective: Patient seen and examined, continues to do well.  Feeling stronger daily.  Continues to demonstrate improvement with activity.  Denies abdominal pain and tolerating diet well.  Remains afebrile.  Assessment & Plan: Sepsis secondary to pneumonia  Sepsis generally improved, initially treated with vancomycin and Zosyn, transitioned to Rocephin, azithromycin.  Will continue IV antibiotics for today and can switch to oral regimen in a.m.  Procalcitonin trending down. WBC normalized, blood cultures negative so far.  Acute recurrent pancreatitis - resolved Unclear etiology, having recurrent pancreatitis for last 20 years.  Lipase within normal limits but imaging study report inflammation surrounding the pancreas suspicious for acute pancreatitis. S/p cholecystectomy many years ago.  Advancing diet as tolerated  AKI Felt to be related to dehydration from sepsis, however FENa 10% consistent with intrinsic issue. Renal ultrasound negative.  Creatinine continues to improve.  Encourage oral hydration.  Monitor renal function in a.m.  Avoid nephrotoxic agent and hypotension  Hypertension On admission hypotension - resolved Improved after IV hydration BP slightly trending up, will resume losartan in a.m. if  renal function remains stable. Continue to monitor BP closely  Depression/anxiety Stable continue antidepressants  Generalized weakness CT head negative for stroke, multi-level degenerative changes of the cervical spine with significant foraminal stenosis.  MRI does not show any spinal stenosis.  Continue gabapentin.  Patient symptoms continue to improve.  PT recommending SNF  DVT prophylaxis: Heparin SQ Code Status: Full code Family Communication: None at bedside  Disposition Plan: SNF in a.m. if remains stable   Consultants:   None  Procedures:   None  Antimicrobials: Anti-infectives (From admission, onward)   Start     Dose/Rate Route Frequency Ordered Stop   01/01/18 2000  cefTRIAXone (ROCEPHIN) 1 g in sodium chloride 0.9 % 100 mL IVPB     1 g 200 mL/hr over 30 Minutes Intravenous Every 24 hours 01/01/18 1928 01/08/18 1959   01/01/18 2000  azithromycin (ZITHROMAX) 500 mg in sodium chloride 0.9 % 250 mL IVPB     500 mg 250 mL/hr over 60 Minutes Intravenous Every 24 hours 01/01/18 1928 01/08/18 1959   01/01/18 1200  vancomycin (VANCOCIN) 1,500 mg in sodium chloride 0.9 % 500 mL IVPB     1,500 mg 250 mL/hr over 120 Minutes Intravenous  Once 01/01/18 1151 01/01/18 1630   01/01/18 1145  piperacillin-tazobactam (ZOSYN) IVPB 3.375 g     3.375 g 100 mL/hr over 30 Minutes Intravenous  Once 01/01/18 1135 01/01/18 1242   01/01/18 1145  vancomycin (VANCOCIN) IVPB 1000 mg/200 mL premix  Status:  Discontinued     1,000 mg 200 mL/hr over 60 Minutes Intravenous  Once 01/01/18 1135 01/01/18 1152         Objective: Vitals:   01/03/18 1142 01/03/18 2053 01/04/18 0403 01/04/18 1400  BP: 95/61 139/62 106/64 (!) 144/70  Pulse: 79 76 76 79  Resp: 18 20 18  16  Temp: 98.2 F (36.8 C) 97.6 F (36.4 C) 98.4 F (36.9 C) 98.5 F (36.9 C)  TempSrc: Oral Oral Oral Oral  SpO2: 93% 96% 94% 95%  Weight: 102.9 kg (226 lb 13.7 oz)  102.8 kg (226 lb 10.1 oz)   Height: 5\' 2"  (1.575 m)        Intake/Output Summary (Last 24 hours) at 01/04/2018 1622 Last data filed at 01/04/2018 1000 Gross per 24 hour  Intake 1360 ml  Output -  Net 1360 ml   Filed Weights   01/01/18 2055 01/03/18 1142 01/04/18 0403  Weight: 101.6 kg (223 lb 15.8 oz) 102.9 kg (226 lb 13.7 oz) 102.8 kg (226 lb 10.1 oz)    Examination:   General: NAD Cardiovascular: RRR, S1/S2 +, no rubs, no gallops Respiratory: CTA bilaterally, no wheezing, no rhonchi Abdominal: Soft, NT, ND, bowel sounds + Extremities: Trace bilateral lower extremity edema, strength 5/5  Data Reviewed: I have personally reviewed following labs and imaging studies  CBC: Recent Labs  Lab 01/01/18 1207 01/01/18 1335 01/02/18 0258 01/03/18 0621  WBC 14.1*  --  11.6* 9.6  NEUTROABS 12.3  --   --   --   HGB 11.6* 11.2* 11.9* 11.1*  HCT 35.8* 33.0* 38.2 34.2*  MCV 91.8  --  92.9 91.2  PLT 202  --  240 161   Basic Metabolic Panel: Recent Labs  Lab 01/01/18 1207 01/01/18 1335 01/02/18 0258 01/03/18 0621 01/04/18 0441  NA 133* 134* 142 142 142  K 4.2 4.2 4.3 3.7 3.5  CL 100* 106 114* 114* 112*  CO2 18*  --  18* 19* 21*  GLUCOSE 94 89 95 96 81  BUN 76* 63* 67* 51* 39*  CREATININE 6.33* 6.40* 4.24* 2.26* 1.50*  CALCIUM 8.4*  --  7.5* 8.4* 8.6*  MG  --   --   --   --  1.5*   GFR: Estimated Creatinine Clearance: 39.8 mL/min (A) (by C-G formula based on SCr of 1.5 mg/dL (H)). Liver Function Tests: Recent Labs  Lab 01/01/18 1207 01/04/18 0441  AST 56* 83*  ALT 44 53  ALKPHOS 69 87  BILITOT 2.2* 0.8  PROT 6.4* 5.8*  ALBUMIN 2.9* 2.2*   Recent Labs  Lab 01/01/18 1207  LIPASE 23   No results for input(s): AMMONIA in the last 168 hours. Coagulation Profile: Recent Labs  Lab 01/01/18 1207  INR 1.19   Cardiac Enzymes: Recent Labs  Lab 01/01/18 1207 01/04/18 0441  CKTOTAL 1,934* 1,149*   BNP (last 3 results) No results for input(s): PROBNP in the last 8760 hours. HbA1C: No results for input(s): HGBA1C in  the last 72 hours. CBG: Recent Labs  Lab 01/01/18 1136  GLUCAP 78   Lipid Profile: No results for input(s): CHOL, HDL, LDLCALC, TRIG, CHOLHDL, LDLDIRECT in the last 72 hours. Thyroid Function Tests: No results for input(s): TSH, T4TOTAL, FREET4, T3FREE, THYROIDAB in the last 72 hours. Anemia Panel: No results for input(s): VITAMINB12, FOLATE, FERRITIN, TIBC, IRON, RETICCTPCT in the last 72 hours. Sepsis Labs: Recent Labs  Lab 01/01/18 1335 01/02/18 0258 01/04/18 0441  PROCALCITON  --  28.34 7.50  LATICACIDVEN 1.96*  --   --     Recent Results (from the past 240 hour(s))  Blood Culture (routine x 2)     Status: None (Preliminary result)   Collection Time: 01/01/18 11:49 AM  Result Value Ref Range Status   Specimen Description   Final    BLOOD LEFT HAND Performed at  [facility], [place] [address], [place], [place] [address]    Special Requests   Final    AEROBIC BOTTLE ONLY Blood Culture adequate volume Performed at [facility] [address], [place], [place] [address]    Culture   Final    NO GROWTH 3 DAYS Performed at [facility], [place] [address] [place], [place] [address]    Report Status PENDING  Incomplete  Blood Culture (routine x 2)     Status: None (Preliminary result)   Collection Time: 01/01/18 12:08 PM  Result Value Ref Range Status   Specimen Description   Final    BLOOD RIGHT ANTECUBITAL Performed at [facility] [address] [place], [place] [address]    Special Requests   Final    BOTTLES DRAWN AEROBIC AND ANAEROBIC Blood Culture adequate volume Performed at [facility] [address] [place], [place] [address]    Culture   Final    NO GROWTH 3 DAYS Performed at [facility], [place] [address], [place], [place] [address]    Report Status PENDING  Incomplete  MRSA PCR Screening     Status: None   Collection Time: 01/01/18 10:04 PM  Result Value Ref Range  Status   MRSA by PCR NEGATIVE NEGATIVE Final    Comment:        The GeneXpert MRSA Assay (FDA approved for NASAL specimens only), is one component of a comprehensive MRSA colonization surveillance program. It is not intended to diagnose MRSA infection nor to guide or monitor treatment for MRSA infections. Performed at [facility], [place] [address], [place],  [address]       Radiology Studies: Mr Cervical Spine Wo Contrast  Result Date: 01/03/2018 CLINICAL DATA:  67 y/o  F; neck pain and stiffness. EXAM: MRI CERVICAL SPINE WITHOUT CONTRAST TECHNIQUE: Multiplanar, multisequence MR imaging of the cervical spine was performed. No intravenous contrast was administered. COMPARISON:  01/02/2018 CT cervical spine. FINDINGS: Alignment: Physiologic. Vertebrae: No fracture, evidence of discitis, or bone lesion. Cord: Normal signal and morphology. Posterior Fossa, vertebral arteries, paraspinal tissues: Negative. Disc levels: C2-3: No significant disc displacement, foraminal stenosis, or canal stenosis. Mild facet hypertrophy. C3-4: Mild disc bulge and facet hypertrophy. No significant foraminal or canal stenosis. C4-5: Disc osteophyte complex with right greater than left uncovertebral and facet hypertrophy. Moderate bilateral foraminal stenosis. No significant canal stenosis. C5-6: Disc osteophyte complex with right greater than left uncovertebral and facet hypertrophy. Moderate right and mild left foraminal stenosis. No significant canal stenosis. C6-7: Disc osteophyte complex with bilateral uncovertebral and facet hypertrophy. Mild foraminal and canal stenosis. C7-T1: No significant disc displacement, foraminal stenosis, or canal stenosis. IMPRESSION: 1. No acute osseous or cord signal abnormality. 2. Cervical spondylosis greatest at the C4-5 and C5-6 levels. 3. Moderate bilateral C4-5 and right C5-6 foraminal stenosis. Multilevel mild foraminal stenosis. No significant canal  stenosis. Electronically Signed   By: Kristine Garbe M.D.   On: 01/03/2018 14:52    Scheduled Meds: . gabapentin  300 mg Oral TID  . heparin  5,000 Units Subcutaneous Q8H  . mouth rinse  15 mL Mouth Rinse BID  . sertraline  100 mg Oral Daily   Continuous Infusions: . azithromycin Stopped (01/03/18 2301)  . cefTRIAXone (ROCEPHIN)  IV Stopped (01/03/18 2113)     LOS: 3 days    Time spent: Total of 25 minutes spent with pt, greater than 50% of which  was spent in discussion of  treatment, counseling and coordination of care   Chipper Oman, MD Pager: Text Page via [email or web address]   If 7PM-7AM, please contact night-coverage [email or web address] 01/04/2018, 4:22 PM   Note - This record has been created using [employer]. Chart creation errors have been sought, but may not always have been located. Such creation errors do not reflect on the standard of medical care.

## 2018-01-05 DIAGNOSIS — B379 Candidiasis, unspecified: Secondary | ICD-10-CM | POA: Diagnosis not present

## 2018-01-05 DIAGNOSIS — B029 Zoster without complications: Secondary | ICD-10-CM | POA: Diagnosis not present

## 2018-01-05 DIAGNOSIS — F419 Anxiety disorder, unspecified: Secondary | ICD-10-CM | POA: Diagnosis not present

## 2018-01-05 DIAGNOSIS — F329 Major depressive disorder, single episode, unspecified: Secondary | ICD-10-CM | POA: Diagnosis not present

## 2018-01-05 DIAGNOSIS — L304 Erythema intertrigo: Secondary | ICD-10-CM | POA: Diagnosis not present

## 2018-01-05 DIAGNOSIS — J302 Other seasonal allergic rhinitis: Secondary | ICD-10-CM | POA: Diagnosis not present

## 2018-01-05 DIAGNOSIS — R52 Pain, unspecified: Secondary | ICD-10-CM | POA: Diagnosis not present

## 2018-01-05 DIAGNOSIS — J13 Pneumonia due to Streptococcus pneumoniae: Secondary | ICD-10-CM | POA: Diagnosis not present

## 2018-01-05 DIAGNOSIS — N179 Acute kidney failure, unspecified: Secondary | ICD-10-CM | POA: Diagnosis not present

## 2018-01-05 DIAGNOSIS — R279 Unspecified lack of coordination: Secondary | ICD-10-CM | POA: Diagnosis not present

## 2018-01-05 DIAGNOSIS — J189 Pneumonia, unspecified organism: Secondary | ICD-10-CM | POA: Diagnosis not present

## 2018-01-05 DIAGNOSIS — M5412 Radiculopathy, cervical region: Secondary | ICD-10-CM | POA: Diagnosis not present

## 2018-01-05 DIAGNOSIS — Z743 Need for continuous supervision: Secondary | ICD-10-CM | POA: Diagnosis not present

## 2018-01-05 DIAGNOSIS — K859 Acute pancreatitis without necrosis or infection, unspecified: Secondary | ICD-10-CM | POA: Diagnosis not present

## 2018-01-05 DIAGNOSIS — A419 Sepsis, unspecified organism: Secondary | ICD-10-CM | POA: Diagnosis not present

## 2018-01-05 DIAGNOSIS — R262 Difficulty in walking, not elsewhere classified: Secondary | ICD-10-CM | POA: Diagnosis not present

## 2018-01-05 DIAGNOSIS — I10 Essential (primary) hypertension: Secondary | ICD-10-CM | POA: Diagnosis not present

## 2018-01-05 DIAGNOSIS — K219 Gastro-esophageal reflux disease without esophagitis: Secondary | ICD-10-CM | POA: Diagnosis not present

## 2018-01-05 DIAGNOSIS — R110 Nausea: Secondary | ICD-10-CM | POA: Diagnosis not present

## 2018-01-05 DIAGNOSIS — F5101 Primary insomnia: Secondary | ICD-10-CM | POA: Diagnosis not present

## 2018-01-05 DIAGNOSIS — M79622 Pain in left upper arm: Secondary | ICD-10-CM | POA: Diagnosis not present

## 2018-01-05 DIAGNOSIS — M6281 Muscle weakness (generalized): Secondary | ICD-10-CM | POA: Diagnosis not present

## 2018-01-05 LAB — BASIC METABOLIC PANEL
ANION GAP: 9 (ref 5–15)
BUN: 28 mg/dL — ABNORMAL HIGH (ref 6–20)
CALCIUM: 8.8 mg/dL — AB (ref 8.9–10.3)
CO2: 19 mmol/L — AB (ref 22–32)
Chloride: 113 mmol/L — ABNORMAL HIGH (ref 101–111)
Creatinine, Ser: 1.09 mg/dL — ABNORMAL HIGH (ref 0.44–1.00)
GFR calc Af Amer: 59 mL/min — ABNORMAL LOW (ref >60)
GFR calc non Af Amer: 51 mL/min — ABNORMAL LOW (ref >60)
Glucose, Bld: 90 mg/dL (ref 65–99)
POTASSIUM: 3.4 mmol/L — AB (ref 3.5–5.1)
Sodium: 141 mmol/L (ref 135–145)

## 2018-01-05 LAB — MAGNESIUM: Magnesium: 1.8 mg/dL (ref 1.7–2.4)

## 2018-01-05 MED ORDER — NYSTATIN 100000 UNIT/GM EX POWD
Freq: Two times a day (BID) | CUTANEOUS | Status: DC
  Administered 2018-01-05: 11:00:00 via TOPICAL
  Filled 2018-01-05: qty 15

## 2018-01-05 MED ORDER — OXYCODONE HCL 5 MG PO TABS: 5.0000 mg | ORAL_TABLET | ORAL | 0 refills | Status: AC | PRN

## 2018-01-05 MED ORDER — GABAPENTIN 300 MG PO CAPS: 300.0000 mg | ORAL_CAPSULE | Freq: Three times a day (TID) | ORAL | Status: DC

## 2018-01-05 MED ORDER — CEFPODOXIME PROXETIL 200 MG PO TABS: 200.0000 mg | ORAL_TABLET | Freq: Two times a day (BID) | ORAL | 0 refills | Status: AC

## 2018-01-05 MED ORDER — NYSTATIN 100000 UNIT/GM EX POWD: Freq: Two times a day (BID) | CUTANEOUS | 0 refills | Status: DC

## 2018-01-05 NOTE — Discharge Summary (Signed)
Physician Discharge Summary  Linda Blackburn  IRJ:[id number]  DOB: [birth year]  DOA: 01/01/2018 PCP: [name], MD  Admit date: 01/01/2018 Discharge date: 01/05/2018  Admitted From: Home Disposition: SNF  Recommendations for Outpatient Follow-up:  1. Follow up with SNF provider at earliest convenience 2. Please obtain BMP/CBC in one week to monitor renal function and hemoglobin 3. Please follow up on the following pending results: Final blood cultures so far no growth  Discharge Condition: Stable CODE STATUS: Full code Diet recommendation: Heart Healthy  Brief/Interim Summary: For full details see H&P/Progress note, but in brief, Linda Blackburn is a 67 year old female with medical history significant of recurrent pancreatitis, hypertension, and anxiety/depression.  Patient presented to the emergency department complaining of generalized weakness, abdominal discomfort, nausea and vomiting.  Upon ED evaluation patient was found to have elevated WBC, chest x-ray suspicious for pneumonia and abdominal CT scan concerning for acute pancreatitis.  Patient was admitted with working diagnosis of sepsis due to pneumonia and possible acute exacerbations of chronic pancreatitis.  Subjective: Patient seen and examined, she continues to improve daily.  Sitting up in chair.  Tolerating diet well.  Remains afebrile.  Denies abdominal pain, chest pain, shortness of breath and palpitation.  Getting stronger every day.  Discharge Diagnoses/Hospital Course:  Sepsis secondary to pneumonia  Sepsis physiology resolved, initially treated with vancomycin and Zosyn, transitioned to Rocephin, azithromycin. Procalcitonin trending down. WBC normalized, blood cultures negative so far.  Completed treatment with azithromycin.  Will discharge on Vantin for 5 more days to complete total of 10 days of antibiotic.  Acute recurrent pancreatitis - resolved Unclear etiology, having recurrent pancreatitis for last 20 years.   Lipase within normal limits but imaging study reported inflammation surrounding the pancreas suspicious for acute pancreatitis. S/p cholecystectomy many years ago.    Tolerating diet well.  AKI Felt to be related to dehydration from sepsis, however FENa 10%. Renal ultrasound negative.    Creatinine back to baseline, encourage oral hydration, monitor renal function in 1 week.   Hypertension On admission hypotension - resolved Improved after IV hydration BP slight trend up, will resume home dose losartan  Monitor BP intermittently.  Depression/anxiety Stable continue antidepressants  Generalized weakness Felt to be related to sepsis and renal failure process.CT head negative for stroke, multi-level degenerative changes of the cervical spine with significant foraminal stenosis.  MRI does not show any spinal stenosis.Started on gabapentin for radiculopathy.  Patient symptoms continue to improve.   Intertrigo of the R breast  Nystatin powder   All other chronic medical condition were stable during the hospitalization.  Patient was seen by physical therapy, recommending SNF On the day of the discharge the patient's vitals were stable, and no other acute medical condition were reported by patient. the patient was felt safe to be discharge to SNF  Discharge Instructions  You were cared for by a hospitalist during your hospital stay. If you have any questions about your discharge medications or the care you received while you were in the hospital after you are discharged, you can call the unit and asked to speak with the hospitalist on call if the hospitalist that took care of you is not available. Once you are discharged, your primary care physician will handle any further medical issues. Please note that NO REFILLS for any discharge medications will be authorized once you are discharged, as it is imperative that you return to your primary care physician (or establish a relationship with a  primary care physician if  you do not have one) for your aftercare needs so that they can reassess your need for medications and monitor your lab values.  Discharge Instructions    Call MD for:  difficulty breathing, headache or visual disturbances   Complete by:  As directed    Call MD for:  extreme fatigue   Complete by:  As directed    Call MD for:  hives   Complete by:  As directed    Call MD for:  persistant dizziness or light-headedness   Complete by:  As directed    Call MD for:  persistant nausea and vomiting   Complete by:  As directed    Call MD for:  redness, tenderness, or signs of infection (pain, swelling, redness, odor or green/yellow discharge around incision site)   Complete by:  As directed    Call MD for:  severe uncontrolled pain   Complete by:  As directed    Call MD for:  temperature >100.4   Complete by:  As directed    Diet - low sodium heart healthy   Complete by:  As directed    Increase activity slowly   Complete by:  As directed      Allergies as of 01/05/2018      Reactions   Other Hives, Swelling   AQUASONIC [place] GEL   Buprenorphine Hcl    "In a scarry movie" weird thoughts   Codeine    GI upset   Morphine And Related    "In a scarry movie" weird thoughts      Medication List    STOP taking these medications   fluconazole 100 MG tablet Commonly known as:  DIFLUCAN   fluticasone 50 MCG/ACT nasal spray Commonly known as:  FLONASE   triamcinolone cream 0.1 % Commonly known as:  KENALOG     TAKE these medications   cefpodoxime 200 MG tablet Commonly known as:  VANTIN Take 1 tablet (200 mg total) by mouth 2 (two) times daily for 5 days.   cetirizine 10 MG tablet Commonly known as:  ZYRTEC Take 10 mg by mouth daily as needed for allergies.   famotidine 20 MG tablet Commonly known as:  PEPCID Take 20 mg by mouth daily.   gabapentin 300 MG capsule Commonly known as:  NEURONTIN Take 1 capsule (300 mg total) by mouth 3 (three) times  daily.   losartan 100 MG tablet Commonly known as:  COZAAR Take 1 tablet (100 mg total) by mouth daily.   nystatin powder Commonly known as:  MYCOSTATIN/NYSTOP Apply topically 2 (two) times daily.   ondansetron 4 MG disintegrating tablet Commonly known as:  ZOFRAN-ODT Take 1 tablet (4 mg total) by mouth every 8 (eight) hours as needed.   oxyCODONE 5 MG immediate release tablet Commonly known as:  Oxy IR/ROXICODONE Take 1 tablet (5 mg total) by mouth every 4 (four) hours as needed for up to 3 days for severe pain.   sertraline 100 MG tablet Commonly known as:  ZOLOFT Take 1 tablet (100 mg total) by mouth daily.   zolpidem 5 MG tablet Commonly known as:  AMBIEN Take 1 tablet (5 mg total) by mouth at bedtime as needed for sleep.      Contact information for after-discharge care    Destination    [facility] .   Service:  Skilled Nursing Contact information: [address] [place] [phone]             Allergies  Allergen Reactions  . Other Hives and Swelling    AQUASONIC [place] GEL  . Buprenorphine Hcl     "In a scarry movie" weird thoughts  . Codeine     GI upset  . Morphine And Related     "In a scarry movie" weird thoughts    Consultations:  None   Procedures/Studies: Ct Abdomen Pelvis Wo Contrast  Result Date: 01/01/2018 CLINICAL DATA:  Abdominal distention EXAM: CT ABDOMEN AND PELVIS WITHOUT CONTRAST TECHNIQUE: Multidetector CT imaging of the abdomen and pelvis was performed following the standard protocol without IV contrast. COMPARISON:  03/21/2016 FINDINGS: Lower chest:  Lower lobe atelectasis, multi segment on the left. Hepatobiliary: Possible hepatic steatosis.Cholecystectomy. No visible choledocholithiasis. Pancreas: Diffuse expansion and peripancreatic edema. Gas at the level of the head is stable and attributed to a duodenal diverticulum. No gross fluid collection. Spleen: Unremarkable. Adrenals/Urinary Tract:  Negative adrenals. No hydronephrosis or stone. Unremarkable bladder. Stomach/Bowel: No obstruction. Appendectomy. Sigmoid diverticulosis. Vascular/Lymphatic: Mild atherosclerotic calcification. No mass or adenopathy. Reproductive:Hysterectomy Other: No ascites or pneumoperitoneum. Musculoskeletal: Degenerative changes without acute or aggressive finding. IMPRESSION: 1. Acute pancreatitis. 2. Multi segment atelectasis. 3. Cholecystectomy Electronically Signed   By: Monte Fantasia M.D.   On: 01/01/2018 15:31   Ct Head Wo Contrast  Result Date: 01/02/2018 CLINICAL DATA:  Fall 12/28/2017. Radiculopathy. Unable to move head or neck. Trauma to head with loss of consciousness. Weakness in the upper and lower extremities. Pain with movement. EXAM: CT HEAD WITHOUT CONTRAST CT CERVICAL SPINE WITHOUT CONTRAST TECHNIQUE: Multidetector CT imaging of the head and cervical spine was performed following the standard protocol without intravenous contrast. Multiplanar CT image reconstructions of the cervical spine were also generated. COMPARISON:  None. FINDINGS: CT HEAD FINDINGS Brain: No acute infarct, hemorrhage, or mass lesion is present. The ventricles are of normal size. No significant extraaxial fluid collection is present. No significant white matter disease is present. The brainstem and cerebellum are normal. Vascular: Atherosclerotic calcifications present within the cavernous internal carotid arteries bilaterally without a hyperdense vessel. Skull: Calvarium is intact. No focal lytic or blastic lesions are present. No significant extracranial soft tissue injury is present. Sinuses/Orbits: The paranasal sinuses and mastoid air cells are clear. Globes and orbits are within normal limits. CT CERVICAL SPINE FINDINGS Alignment: AP alignment is anatomic. There straightening of the normal cervical lordosis. Skull base and vertebrae: The craniocervical junction is normal. No acute or healing fracture is present in the cervical  spine. Vertebral body heights are maintained. Soft tissues and spinal canal: Soft tissues the neck are unremarkable. Disc levels: C2-3: Asymmetric right-sided uncovertebral spurring contributes to mild right foraminal narrowing. C3-4: Facet hypertrophy is worse on the left with mild left foraminal narrowing. C4-5: Severe right and moderate left foraminal stenosis is secondary to uncovertebral and facet disease. C5-6: Severe foraminal narrowing is worse right than left due to uncovertebral disease. C6-7: Severe left and mild right foraminal narrowing is secondary to uncovertebral disease. C7-T1: No focal stenosis. Upper chest: The lung apices are clear. The thoracic inlet is within normal limits. IMPRESSION: 1. No acute trauma to the head or cervical spine. 2. Atherosclerotic changes within the cavernous internal carotid arteries bilaterally without a hyperdense vessel. 3. Multilevel degenerative changes of the cervical spine with significant foraminal stenosis bilaterally which could contribute to radicular symptoms. Electronically Signed   By: San Morelle M.D.   On: 01/02/2018 13:34   Ct Cervical Spine Wo Contrast  Result Date: 01/02/2018 CLINICAL DATA:  Fall 12/28/2017. Radiculopathy. Unable  to move head or neck. Trauma to head with loss of consciousness. Weakness in the upper and lower extremities. Pain with movement. EXAM: CT HEAD WITHOUT CONTRAST CT CERVICAL SPINE WITHOUT CONTRAST TECHNIQUE: Multidetector CT imaging of the head and cervical spine was performed following the standard protocol without intravenous contrast. Multiplanar CT image reconstructions of the cervical spine were also generated. COMPARISON:  None. FINDINGS: CT HEAD FINDINGS Brain: No acute infarct, hemorrhage, or mass lesion is present. The ventricles are of normal size. No significant extraaxial fluid collection is present. No significant white matter disease is present. The brainstem and cerebellum are normal. Vascular:  Atherosclerotic calcifications present within the cavernous internal carotid arteries bilaterally without a hyperdense vessel. Skull: Calvarium is intact. No focal lytic or blastic lesions are present. No significant extracranial soft tissue injury is present. Sinuses/Orbits: The paranasal sinuses and mastoid air cells are clear. Globes and orbits are within normal limits. CT CERVICAL SPINE FINDINGS Alignment: AP alignment is anatomic. There straightening of the normal cervical lordosis. Skull base and vertebrae: The craniocervical junction is normal. No acute or healing fracture is present in the cervical spine. Vertebral body heights are maintained. Soft tissues and spinal canal: Soft tissues the neck are unremarkable. Disc levels: C2-3: Asymmetric right-sided uncovertebral spurring contributes to mild right foraminal narrowing. C3-4: Facet hypertrophy is worse on the left with mild left foraminal narrowing. C4-5: Severe right and moderate left foraminal stenosis is secondary to uncovertebral and facet disease. C5-6: Severe foraminal narrowing is worse right than left due to uncovertebral disease. C6-7: Severe left and mild right foraminal narrowing is secondary to uncovertebral disease. C7-T1: No focal stenosis. Upper chest: The lung apices are clear. The thoracic inlet is within normal limits. IMPRESSION: 1. No acute trauma to the head or cervical spine. 2. Atherosclerotic changes within the cavernous internal carotid arteries bilaterally without a hyperdense vessel. 3. Multilevel degenerative changes of the cervical spine with significant foraminal stenosis bilaterally which could contribute to radicular symptoms. Electronically Signed   By: San Morelle M.D.   On: 01/02/2018 13:34   Mr Cervical Spine Wo Contrast  Result Date: 01/03/2018 CLINICAL DATA:  67 y/o  F; neck pain and stiffness. EXAM: MRI CERVICAL SPINE WITHOUT CONTRAST TECHNIQUE: Multiplanar, multisequence MR imaging of the cervical spine  was performed. No intravenous contrast was administered. COMPARISON:  01/02/2018 CT cervical spine. FINDINGS: Alignment: Physiologic. Vertebrae: No fracture, evidence of discitis, or bone lesion. Cord: Normal signal and morphology. Posterior Fossa, vertebral arteries, paraspinal tissues: Negative. Disc levels: C2-3: No significant disc displacement, foraminal stenosis, or canal stenosis. Mild facet hypertrophy. C3-4: Mild disc bulge and facet hypertrophy. No significant foraminal or canal stenosis. C4-5: Disc osteophyte complex with right greater than left uncovertebral and facet hypertrophy. Moderate bilateral foraminal stenosis. No significant canal stenosis. C5-6: Disc osteophyte complex with right greater than left uncovertebral and facet hypertrophy. Moderate right and mild left foraminal stenosis. No significant canal stenosis. C6-7: Disc osteophyte complex with bilateral uncovertebral and facet hypertrophy. Mild foraminal and canal stenosis. C7-T1: No significant disc displacement, foraminal stenosis, or canal stenosis. IMPRESSION: 1. No acute osseous or cord signal abnormality. 2. Cervical spondylosis greatest at the C4-5 and C5-6 levels. 3. Moderate bilateral C4-5 and right C5-6 foraminal stenosis. Multilevel mild foraminal stenosis. No significant canal stenosis. Electronically Signed   By: Kristine Garbe M.D.   On: 01/03/2018 14:52   US Renal  Result Date: 01/02/2018 CLINICAL DATA:  67 year old female with acute kidney injury. Dehydration. EXAM: RENAL / URINARY TRACT ULTRASOUND  COMPLETE COMPARISON:  Noncontrast CT Abdomen and Pelvis 01/01/2018 and earlier. FINDINGS: Right Kidney: Length: 12.2 centimeters. Echogenicity within normal limits. No mass or hydronephrosis visualized. Left Kidney: Length: 11.9 centimeters. Echogenicity within normal limits. No mass or hydronephrosis visualized. Bladder: Appears normal for degree of bladder distention. Other findings: Increased liver echogenicity  raising the possibility of steatosis (image 4). IMPRESSION: Normal ultrasound appearance of both kidneys and the urinary bladder. Electronically Signed   By: Genevie Ann M.D.   On: 01/02/2018 14:14   Dg Chest Port 1 View  Result Date: 01/01/2018 CLINICAL DATA:  Chest pain EXAM: PORTABLE CHEST 1 VIEW COMPARISON:  March 21, 2016 FINDINGS: There is airspace consolidation in the left lower lobe with small left pleural effusion. The lungs elsewhere are clear. Heart is mildly enlarged with pulmonary vascularity within normal limits. No adenopathy. There is aortic atherosclerosis. No evident bone lesions. IMPRESSION: Left lower lobe airspace consolidation with small left pleural effusion. Lungs elsewhere clear. Heart mildly enlarged, stable. There is aortic atherosclerosis. Aortic Atherosclerosis (ICD10-I70.0). Electronically Signed   By: Lowella Grip III M.D.   On: 01/01/2018 13:02    Discharge Exam: Vitals:   01/04/18 2045 01/05/18 0404  BP: (!) 144/78 (!) 151/79  Pulse: 79 76  Resp: 18 18  Temp: 98.3 F (36.8 C) 98.8 F (37.1 C)  SpO2: 95% 94%   Vitals:   01/04/18 1400 01/04/18 2045 01/05/18 0404 01/05/18 0500  BP: (!) 144/70 (!) 144/78 (!) 151/79   Pulse: 79 79 76   Resp: 16 18 18    Temp: 98.5 F (36.9 C) 98.3 F (36.8 C) 98.8 F (37.1 C)   TempSrc: Oral Oral Oral   SpO2: 95% 95% 94%   Weight:    102.8 kg (226 lb 10.1 oz)  Height:        General: NAD  Cardiovascular: RRR, S1/S2 +, no rubs, no gallops Respiratory: CTA bilaterally, no wheezing, no rhonchi Abdominal: Soft, NT, ND, bowel sounds + Extremities: no edema, no cyanosis Skin: Erythematous rash under r breast with satellite lesions   The results of significant diagnostics from this hospitalization (including imaging, microbiology, ancillary and laboratory) are listed below for reference.     Microbiology: Recent Results (from the past 240 hour(s))  Blood Culture (routine x 2)     Status: None (Preliminary result)    Collection Time: 01/01/18 11:49 AM  Result Value Ref Range Status   Specimen Description   Final    BLOOD LEFT HAND Performed at [facility] [address] [place], [place] [address]    Special Requests   Final    AEROBIC BOTTLE ONLY Blood Culture adequate volume Performed at [facility] [address], [place], [place] [address]    Culture   Final    NO GROWTH 3 DAYS Performed at [facility], [place] [address], [place], [place] [address]    Report Status PENDING  Incomplete  Blood Culture (routine x 2)     Status: None (Preliminary result)   Collection Time: 01/01/18 12:08 PM  Result Value Ref Range Status   Specimen Description   Final    BLOOD RIGHT ANTECUBITAL Performed at [facility] [address], [place], [place] [address]    Special Requests   Final    BOTTLES DRAWN AEROBIC AND ANAEROBIC Blood Culture adequate volume Performed at [facility] [address] [place], [place] [address]    Culture   Final  NO GROWTH 3 DAYS Performed at [facility], [place] [address], [place], [place] [address]    Report Status PENDING  Incomplete  MRSA PCR Screening     Status: None   Collection Time: 01/01/18 10:04 PM  Result Value Ref Range Status   MRSA by PCR NEGATIVE NEGATIVE Final    Comment:        The GeneXpert MRSA Assay (FDA approved for NASAL specimens only), is one component of a comprehensive MRSA colonization surveillance program. It is not intended to diagnose MRSA infection nor to guide or monitor treatment for MRSA infections. Performed at [facility], [place] [address], [place], [place] [address]      Labs: BNP (last 3 results) Recent Labs    01/01/18 1208  BNP 462.7*   Basic Metabolic Panel: Recent Labs  Lab 01/01/18 1207 01/01/18 1335 01/02/18 0258 01/03/18 0621 01/04/18 0441 01/05/18 0439  NA 133* 134* 142 142 142 141  K 4.2 4.2  4.3 3.7 3.5 3.4*  CL 100* 106 114* 114* 112* 113*  CO2 18*  --  18* 19* 21* 19*  GLUCOSE 94 89 95 96 81 90  BUN 76* 63* 67* 51* 39* 28*  CREATININE 6.33* 6.40* 4.24* 2.26* 1.50* 1.09*  CALCIUM 8.4*  --  7.5* 8.4* 8.6* 8.8*  MG  --   --   --   --  1.5* 1.8   Liver Function Tests: Recent Labs  Lab 01/01/18 1207 01/04/18 0441  AST 56* 83*  ALT 44 53  ALKPHOS 69 87  BILITOT 2.2* 0.8  PROT 6.4* 5.8*  ALBUMIN 2.9* 2.2*   Recent Labs  Lab 01/01/18 1207  LIPASE 23   No results for input(s): AMMONIA in the last 168 hours. CBC: Recent Labs  Lab 01/01/18 1207 01/01/18 1335 01/02/18 0258 01/03/18 0621  WBC 14.1*  --  11.6* 9.6  NEUTROABS 12.3  --   --   --   HGB 11.6* 11.2* 11.9* 11.1*  HCT 35.8* 33.0* 38.2 34.2*  MCV 91.8  --  92.9 91.2  PLT 202  --  240 234   Cardiac Enzymes: Recent Labs  Lab 01/01/18 1207 01/04/18 0441  CKTOTAL 1,934* 1,149*   BNP: Invalid input(s): POCBNP CBG: Recent Labs  Lab 01/01/18 1136  GLUCAP 78   D-Dimer No results for input(s): DDIMER in the last 72 hours. Hgb A1c No results for input(s): HGBA1C in the last 72 hours. Lipid Profile No results for input(s): CHOL, HDL, LDLCALC, TRIG, CHOLHDL, LDLDIRECT in the last 72 hours. Thyroid function studies No results for input(s): TSH, T4TOTAL, T3FREE, THYROIDAB in the last 72 hours.  Invalid input(s): FREET3 Anemia work up No results for input(s): VITAMINB12, FOLATE, FERRITIN, TIBC, IRON, RETICCTPCT in the last 72 hours. Urinalysis    Component Value Date/Time   COLORURINE YELLOW 01/01/2018 1814   APPEARANCEUR HAZY (A) 01/01/2018 1814   LABSPEC 1.008 01/01/2018 1814   PHURINE 5.0 01/01/2018 1814   GLUCOSEU NEGATIVE 01/01/2018 1814   HGBUR LARGE (A) 01/01/2018 1814   BILIRUBINUR NEGATIVE 01/01/2018 1814   BILIRUBINUR neg 09/14/2016 1158   KETONESUR NEGATIVE 01/01/2018 1814   PROTEINUR NEGATIVE 01/01/2018 1814   UROBILINOGEN negative 09/14/2016 1158   UROBILINOGEN 0.2 06/07/2014  0005   NITRITE NEGATIVE 01/01/2018 1814   LEUKOCYTESUR SMALL (A) 01/01/2018 1814   Sepsis Labs Invalid input(s): PROCALCITONIN,  WBC,  LACTICIDVEN Microbiology Recent Results (from the past 240 hour(s))  Blood Culture (routine x 2)  Status: None (Preliminary result)   Collection Time: 01/01/18 11:49 AM  Result Value Ref Range Status   Specimen Description   Final    BLOOD LEFT HAND Performed at [facility] [address] [place], [place] [address]    Special Requests   Final    AEROBIC BOTTLE ONLY Blood Culture adequate volume Performed at [facility] [address] [place], [place] [address]    Culture   Final    NO GROWTH 3 DAYS Performed at [facility], [place] [address], [place], [place] [address]    Report Status PENDING  Incomplete  Blood Culture (routine x 2)     Status: None (Preliminary result)   Collection Time: 01/01/18 12:08 PM  Result Value Ref Range Status   Specimen Description   Final    BLOOD RIGHT ANTECUBITAL Performed at [facility] [address] [place], [place] [address]    Special Requests   Final    BOTTLES DRAWN AEROBIC AND ANAEROBIC Blood Culture adequate volume Performed at [facility] [address], [place], [place] [address]    Culture   Final    NO GROWTH 3 DAYS Performed at [facility], [place] [address], [place], [place] [address]    Report Status PENDING  Incomplete  MRSA PCR Screening     Status: None   Collection Time: 01/01/18 10:04 PM  Result Value Ref Range Status   MRSA by PCR NEGATIVE NEGATIVE Final    Comment:        The GeneXpert MRSA Assay (FDA approved for NASAL specimens only), is one component of a comprehensive MRSA colonization surveillance program. It is not intended to diagnose MRSA infection nor to guide or monitor treatment for MRSA infections. Performed at [facility], [place] [address],  [place], [place] [address]      Time coordinating discharge: 32 minutes  SIGNED:  Chipper Oman, MD  [facility] 01/05/2018, 9:45 AM  Pager please text page via  [email or web address]  Note - This record has been created using [employer]. Chart creation errors have been sought, but may not always have been located. Such creation errors do not reflect on the standard of medical care.

## 2018-01-05 NOTE — Progress Notes (Signed)
Report called to nurse supervisor Lattie Haw at [facility] prior to D/C. Pt discharged via [facility], all belongings sent with pt. AVS and printed Oxy prescription sent in packet. Per pt, no family members are to be notified- her sons and friend are aware.

## 2018-01-05 NOTE — Clinical Social Work Placement (Addendum)
Nurse call report to : [phone] Room: 609.A PTAR to transport.   CLINICAL SOCIAL WORK PLACEMENT  NOTE  Date:  01/05/2018  Patient Details  Name: Linda Blackburn MRN: [id number] Date of Birth: [birth year]  Clinical Social Work is seeking post-discharge placement for this patient at the [place] level of care (*CSW will initial, date and re-position this form in  chart as items are completed):  Yes   Patient/family provided with [facility] Work Department's list of facilities offering this level of care within the geographic area requested by the patient (or if unable, by the patient's family).  Yes   Patient/family informed of their freedom to choose among providers that offer the needed level of care, that participate in Medicare, Medicaid or managed care program needed by the patient, have an available bed and are willing to accept the patient.  Yes   Patient/family informed of [employer]'s ownership interest in [facility] and [facility], as well as of the fact that they are under no obligation to receive care at these facilities.  PASRR submitted to EDS on 01/04/18     PASRR number received on 01/04/18     Existing PASRR number confirmed on       FL2 transmitted to all facilities in geographic area requested by pt/family on 01/04/18     FL2 transmitted to all facilities within larger geographic area on       Patient informed that his/her managed care company has contracts with or will negotiate with certain facilities, including the following:        Yes   Patient/family informed of bed offers received.  Patient chooses bed at [facility]     Physician recommends and patient chooses bed at      Patient to be transferred to [facility] on 01/04/18.  Patient to be transferred to facility by PTAR     Patient family notified on   of transfer. Patient to notify her own family.   Name of family member notified:      Patient to notify  her own family.   PHYSICIAN Please prepare priority discharge summary, including medications     Additional Comment:    _______________________________________________ Lia Hopping, LCSW 01/05/2018, 8:50 AM

## 2018-01-06 LAB — CULTURE, BLOOD (ROUTINE X 2)
CULTURE: NO GROWTH
Culture: NO GROWTH
SPECIAL REQUESTS: ADEQUATE
SPECIAL REQUESTS: ADEQUATE

## 2018-01-07 DIAGNOSIS — J189 Pneumonia, unspecified organism: Secondary | ICD-10-CM | POA: Diagnosis not present

## 2018-01-07 DIAGNOSIS — J302 Other seasonal allergic rhinitis: Secondary | ICD-10-CM | POA: Diagnosis not present

## 2018-01-07 DIAGNOSIS — M6281 Muscle weakness (generalized): Secondary | ICD-10-CM | POA: Diagnosis not present

## 2018-01-07 DIAGNOSIS — K219 Gastro-esophageal reflux disease without esophagitis: Secondary | ICD-10-CM | POA: Diagnosis not present

## 2018-01-07 DIAGNOSIS — A419 Sepsis, unspecified organism: Secondary | ICD-10-CM | POA: Diagnosis not present

## 2018-01-07 DIAGNOSIS — L304 Erythema intertrigo: Secondary | ICD-10-CM | POA: Diagnosis not present

## 2018-01-07 DIAGNOSIS — K859 Acute pancreatitis without necrosis or infection, unspecified: Secondary | ICD-10-CM | POA: Diagnosis not present

## 2018-01-07 DIAGNOSIS — B029 Zoster without complications: Secondary | ICD-10-CM | POA: Diagnosis not present

## 2018-01-07 DIAGNOSIS — N179 Acute kidney failure, unspecified: Secondary | ICD-10-CM | POA: Diagnosis not present

## 2018-01-07 DIAGNOSIS — M79622 Pain in left upper arm: Secondary | ICD-10-CM | POA: Diagnosis not present

## 2018-01-07 DIAGNOSIS — I10 Essential (primary) hypertension: Secondary | ICD-10-CM | POA: Diagnosis not present

## 2018-01-07 DIAGNOSIS — M5412 Radiculopathy, cervical region: Secondary | ICD-10-CM | POA: Diagnosis not present

## 2018-01-09 DIAGNOSIS — K859 Acute pancreatitis without necrosis or infection, unspecified: Secondary | ICD-10-CM | POA: Diagnosis not present

## 2018-01-09 DIAGNOSIS — I10 Essential (primary) hypertension: Secondary | ICD-10-CM | POA: Diagnosis not present

## 2018-01-09 DIAGNOSIS — M5412 Radiculopathy, cervical region: Secondary | ICD-10-CM | POA: Diagnosis not present

## 2018-01-09 DIAGNOSIS — K219 Gastro-esophageal reflux disease without esophagitis: Secondary | ICD-10-CM | POA: Diagnosis not present

## 2018-01-09 DIAGNOSIS — A419 Sepsis, unspecified organism: Secondary | ICD-10-CM | POA: Diagnosis not present

## 2018-01-09 DIAGNOSIS — B029 Zoster without complications: Secondary | ICD-10-CM | POA: Diagnosis not present

## 2018-01-09 DIAGNOSIS — F419 Anxiety disorder, unspecified: Secondary | ICD-10-CM | POA: Diagnosis not present

## 2018-01-09 DIAGNOSIS — N179 Acute kidney failure, unspecified: Secondary | ICD-10-CM | POA: Diagnosis not present

## 2018-01-09 DIAGNOSIS — J302 Other seasonal allergic rhinitis: Secondary | ICD-10-CM | POA: Diagnosis not present

## 2018-01-09 DIAGNOSIS — M6281 Muscle weakness (generalized): Secondary | ICD-10-CM | POA: Diagnosis not present

## 2018-01-09 DIAGNOSIS — J189 Pneumonia, unspecified organism: Secondary | ICD-10-CM | POA: Diagnosis not present

## 2018-01-09 DIAGNOSIS — L304 Erythema intertrigo: Secondary | ICD-10-CM | POA: Diagnosis not present

## 2018-01-11 DIAGNOSIS — N179 Acute kidney failure, unspecified: Secondary | ICD-10-CM | POA: Diagnosis not present

## 2018-01-11 DIAGNOSIS — J302 Other seasonal allergic rhinitis: Secondary | ICD-10-CM | POA: Diagnosis not present

## 2018-01-11 DIAGNOSIS — K219 Gastro-esophageal reflux disease without esophagitis: Secondary | ICD-10-CM | POA: Diagnosis not present

## 2018-01-11 DIAGNOSIS — M79622 Pain in left upper arm: Secondary | ICD-10-CM | POA: Diagnosis not present

## 2018-01-11 DIAGNOSIS — A419 Sepsis, unspecified organism: Secondary | ICD-10-CM | POA: Diagnosis not present

## 2018-01-11 DIAGNOSIS — B029 Zoster without complications: Secondary | ICD-10-CM | POA: Diagnosis not present

## 2018-01-11 DIAGNOSIS — L304 Erythema intertrigo: Secondary | ICD-10-CM | POA: Diagnosis not present

## 2018-01-11 DIAGNOSIS — K859 Acute pancreatitis without necrosis or infection, unspecified: Secondary | ICD-10-CM | POA: Diagnosis not present

## 2018-01-11 DIAGNOSIS — J189 Pneumonia, unspecified organism: Secondary | ICD-10-CM | POA: Diagnosis not present

## 2018-01-11 DIAGNOSIS — M5412 Radiculopathy, cervical region: Secondary | ICD-10-CM | POA: Diagnosis not present

## 2018-01-11 DIAGNOSIS — M6281 Muscle weakness (generalized): Secondary | ICD-10-CM | POA: Diagnosis not present

## 2018-01-11 DIAGNOSIS — I10 Essential (primary) hypertension: Secondary | ICD-10-CM | POA: Diagnosis not present

## 2018-01-16 ENCOUNTER — Encounter: Payer: Self-pay | Admitting: Family Medicine

## 2018-01-16 ENCOUNTER — Ambulatory Visit (INDEPENDENT_AMBULATORY_CARE_PROVIDER_SITE_OTHER): Payer: Medicare Other | Admitting: Family Medicine

## 2018-01-16 VITALS — BP 124/74 | HR 84 | Temp 98.2°F | Wt 211.4 lb

## 2018-01-16 DIAGNOSIS — R531 Weakness: Secondary | ICD-10-CM

## 2018-01-16 DIAGNOSIS — N179 Acute kidney failure, unspecified: Secondary | ICD-10-CM | POA: Diagnosis not present

## 2018-01-16 DIAGNOSIS — R748 Abnormal levels of other serum enzymes: Secondary | ICD-10-CM

## 2018-01-16 DIAGNOSIS — D649 Anemia, unspecified: Secondary | ICD-10-CM

## 2018-01-16 DIAGNOSIS — R300 Dysuria: Secondary | ICD-10-CM

## 2018-01-16 DIAGNOSIS — R7401 Elevation of levels of liver transaminase levels: Secondary | ICD-10-CM

## 2018-01-16 DIAGNOSIS — M791 Myalgia, unspecified site: Secondary | ICD-10-CM | POA: Diagnosis not present

## 2018-01-16 DIAGNOSIS — R740 Nonspecific elevation of levels of transaminase and lactic acid dehydrogenase [LDH]: Secondary | ICD-10-CM | POA: Diagnosis not present

## 2018-01-16 DIAGNOSIS — I10 Essential (primary) hypertension: Secondary | ICD-10-CM | POA: Diagnosis not present

## 2018-01-16 DIAGNOSIS — Q453 Other congenital malformations of pancreas and pancreatic duct: Secondary | ICD-10-CM

## 2018-01-16 DIAGNOSIS — K859 Acute pancreatitis without necrosis or infection, unspecified: Secondary | ICD-10-CM | POA: Diagnosis not present

## 2018-01-16 LAB — COMPREHENSIVE METABOLIC PANEL
ALBUMIN: 4 g/dL (ref 3.5–5.2)
ALK PHOS: 75 U/L (ref 39–117)
ALT: 22 U/L (ref 0–35)
AST: 20 U/L (ref 0–37)
BUN: 17 mg/dL (ref 6–23)
CALCIUM: 9.7 mg/dL (ref 8.4–10.5)
CO2: 29 mEq/L (ref 19–32)
CREATININE: 0.94 mg/dL (ref 0.40–1.20)
Chloride: 103 mEq/L (ref 96–112)
GFR: 62.75 mL/min (ref >60.00)
Glucose, Bld: 86 mg/dL (ref 70–99)
Potassium: 4.3 mEq/L (ref 3.5–5.1)
SODIUM: 140 meq/L (ref 135–145)
Total Bilirubin: 0.5 mg/dL (ref 0.2–1.2)
Total Protein: 7.1 g/dL (ref 6.0–8.3)

## 2018-01-16 LAB — CBC WITH DIFFERENTIAL/PLATELET
BASOS PCT: 2.1 % (ref 0.0–3.0)
Basophils Absolute: 0.2 10*3/uL — ABNORMAL HIGH (ref 0.0–0.1)
EOS ABS: 0.2 10*3/uL (ref 0.0–0.7)
EOS PCT: 2.8 % (ref 0.0–5.0)
HEMATOCRIT: 41.6 % (ref 36.0–46.0)
HEMOGLOBIN: 13.8 g/dL (ref 12.0–15.0)
LYMPHS PCT: 40.8 % (ref 12.0–46.0)
Lymphs Abs: 3 10*3/uL (ref 0.7–4.0)
MCHC: 33.1 g/dL (ref 30.0–36.0)
MCV: 89.2 fl (ref 78.0–100.0)
MONO ABS: 0.7 10*3/uL (ref 0.1–1.0)
Monocytes Relative: 9.1 % (ref 3.0–12.0)
Neutro Abs: 3.4 10*3/uL (ref 1.4–7.7)
Neutrophils Relative %: 45.2 % (ref 43.0–77.0)
Platelets: 473 10*3/uL — ABNORMAL HIGH (ref 150.0–400.0)
RBC: 4.67 Mil/uL (ref 3.87–5.11)
RDW: 15.3 % (ref 11.5–15.5)
WBC: 7.4 10*3/uL (ref 4.0–10.5)

## 2018-01-16 LAB — TSH: TSH: 1.47 u[IU]/mL (ref 0.35–4.50)

## 2018-01-16 LAB — MAGNESIUM: MAGNESIUM: 2.1 mg/dL (ref 1.5–2.5)

## 2018-01-16 LAB — CK: Total CK: 38 U/L (ref 7–177)

## 2018-01-16 MED ORDER — ONDANSETRON 4 MG PO TBDP: 4.0000 mg | ORAL_TABLET | Freq: Three times a day (TID) | ORAL | 1 refills | Status: DC | PRN

## 2018-01-16 NOTE — Progress Notes (Signed)
Subjective:     Patient ID: Linda Blackburn, female   DOB: 1951-01-01, 67 y.o.   MRN: [id number]  HPI Patient seen following recent hospitalization. She has history of recurrent pancreatitis with diagnosis of pancreas devisum. She was admitted on 01/01/18. She apparently a few days prior to admission developed some generalized weakness along with some epigastric abdominal discomfort, nausea, and vomiting. She was extremely weak and had difficulty getting off the couch prior to admission and apparently ambulated very little for a few days prior to admission. On arrival to ED she had elevated white count and chest x-ray suspicious for possible pneumonia. Abdominal CT concerning for acute pancreatitis though her lipase was normal. She had working diagnosis sepsis due to pneumonia with acute exacerbation of chronic pancreatitis.  Patient's blood cultures came back negative. Urine strep screen negative. She was covered with broad-spectrum antibiotics with vancomycin and Zosyn and then transitioned to Rocephin and Zithromax and discharged with 5 more days of Vantin. Her white blood count normalized.  She is eating and drinking fluids now still had some epigastric discomfort though improving slowly. She's had previous cholecystectomy. She had acute kidney injury related to dehydration with possible component of sepsis. Renal US unremarkable. Creatinine was coming back to baseline at discharge. She also was noted to have low magnesium of 1.5 and slightly low potassium at discharge 3.4.  No cough or dyspnea but is still fatigued.    She had initial hypotension and blood pressure medications held on admission and then resumed at discharge. Patient had elevated liver transaminases with AST elevation and also had elevated creatinine kinase. She does not use any alcohol whatsoever.  She had difficulty initially moving upper extremities and had both CT of the head (negative for stroke) and CT the spine which showed some  degenerative changes.   MRI revealed no spinal stenosis or acute abnormalities. She was started on gabapentin for some radiculitis type symptoms but not taking currently. Still has occasional pains down left upper extremity. No chest pain.  Patient apparently broke out in shingles rash day she was discharged from hospital. She was admitted to rehabilitation facility for some physical therapy. She has essentially no pain right now in her right buttock and rash is drying up very well.  2 day history of very mild burning with urination. No vaginal discharge. No fever. No cough.  Past Medical History:  Diagnosis Date  . Abnormal uterine bleeding   . Amenorrhea   . Anxiety   . Depression   . Gallstones   . Hay fever   . HTN (hypertension)   . Pancreatitis   . Urine incontinence   . UTI (urinary tract infection)    reoccuring    Past Surgical History:  Procedure Laterality Date  . ABDOMINAL HYSTERECTOMY  1984   fibroids  . APPENDECTOMY  1984  . CHOLECYSTECTOMY  09/08/1999  . ERCP W/ SPHINCTEROTOMY AND BALLOON DILATION  08/2008  . OOPHORECTOMY     Only 1 was taken  . TONSILLECTOMY    . TUBAL LIGATION      reports that she has never smoked. She has never used smokeless tobacco. She reports that she drinks alcohol. She reports that she does not use drugs. family history includes Alcohol abuse in her mother; Hiatal hernia in her maternal aunt; Prostate cancer in her father and paternal grandfather; Stroke in her maternal aunt. Allergies  Allergen Reactions  . Other Hives and Swelling    AQUASONIC [place] GEL  . Buprenorphine Hcl     "  In a scarry movie" weird thoughts  . Codeine     GI upset  . Morphine And Related     "In a scarry movie" weird thoughts     Review of Systems  Constitutional: Positive for fatigue. Negative for chills and fever.  HENT: Negative for congestion and sore throat.   Respiratory: Negative for cough, shortness of breath and wheezing.   Cardiovascular:  Negative for chest pain, palpitations and leg swelling.  Gastrointestinal: Positive for abdominal pain. Negative for abdominal distention, blood in stool, constipation, diarrhea and vomiting. Nausea: occasional intermittent nausea.  Genitourinary: Positive for dysuria. Negative for difficulty urinating and hematuria.  Neurological: Positive for weakness. Negative for syncope and headaches.  Psychiatric/Behavioral: Negative for confusion.       Objective:   Physical Exam  Constitutional: She appears well-developed and well-nourished.  Eyes: Pupils are equal, round, and reactive to light.  Neck: Neck supple. No JVD present. No thyromegaly present.  Cardiovascular: Normal rate and regular rhythm. Exam reveals no gallop.  Pulmonary/Chest: Effort normal and breath sounds normal. No respiratory distress. She has no wheezes. She has no rales.  Abdominal: Soft.  She has some mild tenderness epigastric region to palpation. No guarding. No hepatomegaly or splenomegaly  Musculoskeletal: She exhibits no edema.  Neurological: She is alert.  Skin: Rash noted.  Patient has couple areas of small eschar right buttock which are healing very well from her recent shingles. No other acute rash       Assessment:     #1 recent episode of generalized weakness question multifactorial with acute pancreatitis, possible pneumonia, dehydration, acute kidney injury, electrolyte disturbance with low magnesium  #2 possible recent pneumonia with working diagnosis of sepsis but blood cultures subsequently negative. Chest x-ray somewhat equivocal for pneumonia but patient improved with broad-spectrum antibiotics and hydration  #3 acute pancreatitis clinically though lipase remained normal  #4 recent acute kidney injury felt to be related to dehydration possibly from sepsis and decreased intake. Improved following IV hydration  #5 hypomagnesemia  #6 elevate creatinine kinase. Suspect she had some mild rhabdomyolysis  from several days of inactivity. She is complaining of some more general fatigue and even some mild persistent myalgias and need to check TSH  #7 mild dysuria of 2 days duration. Rule out UTI  #8 recent shingles involving right buttock healing well with minimal pain    Plan:     -Obtain further labs with comprehensive metabolic panel, CBC, TSH, magnesium level, creatinine kinase -Continue to progress diet as tolerated -Refill Zofran for as needed use -Follow-up immediately for any recurrent vomiting, fever, or progressive abdominal pain -over 40 minutes spent with patient of which > 50% involved in direct counseling and discussion regarding recent hospital admission, lab work, current medications, diet, indications for more urgent follow up.  [name] MD [facility] at [facility]

## 2018-01-16 NOTE — Patient Instructions (Signed)
We will call you regarding multiple labs done today  Stay well-hydrated  Follow-up immediately for any fever, progressive abdominal pain, or any recurrent vomiting

## 2018-03-07 ENCOUNTER — Emergency Department (HOSPITAL_COMMUNITY): Payer: Medicare Other

## 2018-03-07 ENCOUNTER — Encounter (HOSPITAL_COMMUNITY): Payer: Self-pay | Admitting: Emergency Medicine

## 2018-03-07 ENCOUNTER — Inpatient Hospital Stay (HOSPITAL_COMMUNITY)
Admission: EM | Admit: 2018-03-07 | Discharge: 2018-03-11 | DRG: 440 | Disposition: A | Discharge status: Home / Self Care | Payer: Medicare Other | Attending: Internal Medicine | Admitting: Internal Medicine

## 2018-03-07 DIAGNOSIS — F329 Major depressive disorder, single episode, unspecified: Secondary | ICD-10-CM | POA: Diagnosis present

## 2018-03-07 DIAGNOSIS — R1084 Generalized abdominal pain: Secondary | ICD-10-CM | POA: Diagnosis not present

## 2018-03-07 DIAGNOSIS — R1013 Epigastric pain: Secondary | ICD-10-CM | POA: Diagnosis not present

## 2018-03-07 DIAGNOSIS — K8681 Exocrine pancreatic insufficiency: Secondary | ICD-10-CM | POA: Diagnosis present

## 2018-03-07 DIAGNOSIS — K861 Other chronic pancreatitis: Secondary | ICD-10-CM | POA: Diagnosis present

## 2018-03-07 DIAGNOSIS — R112 Nausea with vomiting, unspecified: Secondary | ICD-10-CM | POA: Diagnosis present

## 2018-03-07 DIAGNOSIS — Z888 Allergy status to other drugs, medicaments and biological substances status: Secondary | ICD-10-CM | POA: Diagnosis not present

## 2018-03-07 DIAGNOSIS — Z79899 Other long term (current) drug therapy: Secondary | ICD-10-CM | POA: Diagnosis not present

## 2018-03-07 DIAGNOSIS — Z885 Allergy status to narcotic agent status: Secondary | ICD-10-CM | POA: Diagnosis not present

## 2018-03-07 DIAGNOSIS — B029 Zoster without complications: Secondary | ICD-10-CM | POA: Diagnosis present

## 2018-03-07 DIAGNOSIS — K859 Acute pancreatitis without necrosis or infection, unspecified: Secondary | ICD-10-CM | POA: Diagnosis not present

## 2018-03-07 DIAGNOSIS — K8500 Idiopathic acute pancreatitis without necrosis or infection: Secondary | ICD-10-CM | POA: Diagnosis not present

## 2018-03-07 DIAGNOSIS — I10 Essential (primary) hypertension: Secondary | ICD-10-CM | POA: Diagnosis not present

## 2018-03-07 DIAGNOSIS — R109 Unspecified abdominal pain: Secondary | ICD-10-CM | POA: Diagnosis not present

## 2018-03-07 DIAGNOSIS — R1110 Vomiting, unspecified: Secondary | ICD-10-CM | POA: Diagnosis not present

## 2018-03-07 LAB — COMPREHENSIVE METABOLIC PANEL
ALT: 34 U/L (ref 0–44)
AST: 34 U/L (ref 15–41)
Albumin: 3.9 g/dL (ref 3.5–5.0)
Alkaline Phosphatase: 68 U/L (ref 38–126)
Anion gap: 7 (ref 5–15)
BUN: 16 mg/dL (ref 8–23)
CO2: 24 mmol/L (ref 22–32)
CREATININE: 0.83 mg/dL (ref 0.44–1.00)
Calcium: 8.7 mg/dL — ABNORMAL LOW (ref 8.9–10.3)
Chloride: 111 mmol/L (ref 98–111)
GFR calc Af Amer: >60 mL/min (ref >60)
GFR calc non Af Amer: >60 mL/min (ref >60)
GLUCOSE: 123 mg/dL — AB (ref 70–99)
Potassium: 4 mmol/L (ref 3.5–5.1)
SODIUM: 142 mmol/L (ref 135–145)
Total Bilirubin: 0.9 mg/dL (ref 0.3–1.2)
Total Protein: 6.9 g/dL (ref 6.5–8.1)

## 2018-03-07 LAB — CBC WITH DIFFERENTIAL/PLATELET
BASOS ABS: 0 10*3/uL (ref 0.0–0.1)
Basophils Relative: 0 %
EOS ABS: 0.1 10*3/uL (ref 0.0–0.7)
EOS PCT: 0 %
HCT: 46.7 % — ABNORMAL HIGH (ref 36.0–46.0)
Hemoglobin: 15.2 g/dL — ABNORMAL HIGH (ref 12.0–15.0)
LYMPHS ABS: 1.4 10*3/uL (ref 0.7–4.0)
Lymphocytes Relative: 12 %
MCH: 29.7 pg (ref 26.0–34.0)
MCHC: 32.5 g/dL (ref 30.0–36.0)
MCV: 91.4 fL (ref 78.0–100.0)
MONO ABS: 0.4 10*3/uL (ref 0.1–1.0)
MONOS PCT: 4 %
NEUTROS ABS: 10.2 10*3/uL — AB (ref 1.7–7.7)
NEUTROS PCT: 84 %
PLATELETS: 245 10*3/uL (ref 150–400)
RBC: 5.11 MIL/uL (ref 3.87–5.11)
RDW: 14.2 % (ref 11.5–15.5)
WBC: 12.1 10*3/uL — ABNORMAL HIGH (ref 4.0–10.5)

## 2018-03-07 LAB — LACTATE DEHYDROGENASE: LDH: 283 U/L — ABNORMAL HIGH (ref 98–192)

## 2018-03-07 LAB — LIPASE, BLOOD: Lipase: 6422 U/L — ABNORMAL HIGH (ref 11–51)

## 2018-03-07 MED ORDER — DIPHENHYDRAMINE HCL 50 MG/ML IJ SOLN
25.0000 mg | Freq: Four times a day (QID) | INTRAMUSCULAR | Status: DC | PRN
  Administered 2018-03-07 – 2018-03-08 (×3): 25 mg via INTRAVENOUS
  Filled 2018-03-07 (×3): qty 1

## 2018-03-07 MED ORDER — ACETAMINOPHEN 325 MG PO TABS: 650.0000 mg | ORAL_TABLET | Freq: Four times a day (QID) | ORAL | Status: DC | PRN

## 2018-03-07 MED ORDER — FENTANYL CITRATE (PF) 100 MCG/2ML IJ SOLN
50.0000 ug | Freq: Once | INTRAMUSCULAR | Status: AC
  Administered 2018-03-07: 50 ug via INTRAVENOUS
  Filled 2018-03-07: qty 2

## 2018-03-07 MED ORDER — ZOLPIDEM TARTRATE 5 MG PO TABS: 5.0000 mg | ORAL_TABLET | Freq: Every evening | ORAL | Status: DC | PRN

## 2018-03-07 MED ORDER — ACETAMINOPHEN 650 MG RE SUPP: 650.0000 mg | Freq: Four times a day (QID) | RECTAL | Status: DC | PRN

## 2018-03-07 MED ORDER — ENOXAPARIN SODIUM 40 MG/0.4ML ~~LOC~~ SOLN
40.0000 mg | SUBCUTANEOUS | Status: DC
  Administered 2018-03-07 – 2018-03-10 (×4): 40 mg via SUBCUTANEOUS
  Filled 2018-03-07 (×4): qty 0.4

## 2018-03-07 MED ORDER — SERTRALINE HCL 100 MG PO TABS
100.0000 mg | ORAL_TABLET | Freq: Every day | ORAL | Status: DC
  Administered 2018-03-08 – 2018-03-11 (×4): 100 mg via ORAL
  Filled 2018-03-07 (×4): qty 1

## 2018-03-07 MED ORDER — SODIUM CHLORIDE 0.9 % IV BOLUS
1000.0000 mL | Freq: Once | INTRAVENOUS | Status: AC
Start: 2018-03-07 — End: 2018-03-07
  Administered 2018-03-07: 1000 mL via INTRAVENOUS

## 2018-03-07 MED ORDER — FAMOTIDINE 20 MG PO TABS
20.0000 mg | ORAL_TABLET | Freq: Every day | ORAL | Status: DC
  Administered 2018-03-08 – 2018-03-11 (×4): 20 mg via ORAL
  Filled 2018-03-07 (×4): qty 1

## 2018-03-07 MED ORDER — SODIUM CHLORIDE 0.9 % IV SOLN
INTRAVENOUS | Status: DC
  Administered 2018-03-07: 18:00:00 via INTRAVENOUS

## 2018-03-07 MED ORDER — LORATADINE 10 MG PO TABS
10.0000 mg | ORAL_TABLET | Freq: Every day | ORAL | Status: DC
  Administered 2018-03-08 – 2018-03-11 (×4): 10 mg via ORAL
  Filled 2018-03-07 (×4): qty 1

## 2018-03-07 MED ORDER — LOSARTAN POTASSIUM 50 MG PO TABS
100.0000 mg | ORAL_TABLET | Freq: Every day | ORAL | Status: DC
  Administered 2018-03-08: 100 mg via ORAL
  Filled 2018-03-07: qty 2

## 2018-03-07 MED ORDER — SODIUM CHLORIDE 0.9 % IV SOLN
INTRAVENOUS | Status: DC
  Administered 2018-03-07 – 2018-03-08 (×2): via INTRAVENOUS

## 2018-03-07 MED ORDER — ONDANSETRON HCL 4 MG/2ML IJ SOLN
4.0000 mg | Freq: Four times a day (QID) | INTRAMUSCULAR | Status: DC | PRN
  Administered 2018-03-07 – 2018-03-09 (×4): 4 mg via INTRAVENOUS
  Filled 2018-03-07 (×5): qty 2

## 2018-03-07 MED ORDER — HYDROMORPHONE HCL 1 MG/ML IJ SOLN
0.5000 mg | INTRAMUSCULAR | Status: DC | PRN
  Administered 2018-03-07 – 2018-03-08 (×4): 0.5 mg via INTRAVENOUS
  Filled 2018-03-07 (×4): qty 0.5

## 2018-03-07 MED ORDER — METOCLOPRAMIDE HCL 5 MG/ML IJ SOLN
5.0000 mg | Freq: Once | INTRAMUSCULAR | Status: AC
  Administered 2018-03-07: 5 mg via INTRAVENOUS
  Filled 2018-03-07: qty 2

## 2018-03-07 MED ORDER — DIPHENHYDRAMINE HCL 50 MG/ML IJ SOLN
12.5000 mg | Freq: Once | INTRAMUSCULAR | Status: AC
Start: 2018-03-07 — End: 2018-03-07
  Administered 2018-03-07: 12.5 mg via INTRAVENOUS
  Filled 2018-03-07: qty 1

## 2018-03-07 NOTE — ED Notes (Signed)
Pt says she doesn't have to urinate at this time

## 2018-03-07 NOTE — ED Notes (Signed)
ED TO INPATIENT HANDOFF REPORT  Name/Age/Gender [name] 67 y.o. female  Code Status    Code Status Orders  (From admission, onward)        Start     Ordered   03/07/18 1856  Full code  Continuous     03/07/18 1857    Code Status History    Date Active Date Inactive Code Status Order ID Comments User Context   01/01/2018 2050 01/05/2018 1449 Full Code [id number]  Velvet Bathe, MD Inpatient   06/06/2014 0250 06/12/2014 1459 Full Code [id number]  Etta Quill, DO ED      Home/SNF/Other Home  Chief Complaint abd pain   Level of Care/Admitting Diagnosis ED Disposition    ED Disposition Condition [facility] Area: [facility] [[id number]]  Level of Care: Telemetry [5]  Admit to tele based on following criteria: Other see comments  Comments: monitor for ischemia  Diagnosis: Pancreatitis [443154]  Admitting Physician: Jani Gravel [[id number]]  Attending Physician: Jani Gravel [id number]  Estimated length of stay: past midnight tomorrow  Certification:: I certify this patient will need inpatient services for at least 2 midnights  PT Class (Do Not Modify): Inpatient [101]  PT Acc Code (Do Not Modify): Private [1]       Medical History Past Medical History:  Diagnosis Date  . Abnormal uterine bleeding   . Amenorrhea   . Anxiety   . Depression   . Gallstones   . Hay fever   . HTN (hypertension)   . Pancreatitis   . Urine incontinence   . UTI (urinary tract infection)    reoccuring     Allergies Allergies  Allergen Reactions  . Other Hives and Swelling    AQUASONIC [place] GEL  . Buprenorphine Hcl     "In a scarry movie" weird thoughts  . Codeine     GI upset  . Morphine And Related     "In a scarry movie" weird thoughts    IV Location/Drains/Wounds Patient Lines/Drains/Airways Status   Active Line/Drains/Airways    Name:   Placement date:   Placement time:   Site:   Days:   External Urinary Catheter   01/01/18    2100    -   65           Labs/Imaging Results for orders placed or performed during the hospital encounter of 03/07/18 (from the past 48 hour(s))  CBC with Differential/Platelet     Status: Abnormal   Collection Time: 03/07/18  4:56 PM  Result Value Ref Range   WBC 12.1 (H) 4.0 - 10.5 K/uL   RBC 5.11 3.87 - 5.11 MIL/uL   Hemoglobin 15.2 (H) 12.0 - 15.0 g/dL   HCT 46.7 (H) 36.0 - 46.0 %   MCV 91.4 78.0 - 100.0 fL   MCH 29.7 26.0 - 34.0 pg   MCHC 32.5 30.0 - 36.0 g/dL   RDW 14.2 11.5 - 15.5 %   Platelets 245 150 - 400 K/uL   Neutrophils Relative % 84 %   Neutro Abs 10.2 (H) 1.7 - 7.7 K/uL   Lymphocytes Relative 12 %   Lymphs Abs 1.4 0.7 - 4.0 K/uL   Monocytes Relative 4 %   Monocytes Absolute 0.4 0.1 - 1.0 K/uL   Eosinophils Relative 0 %   Eosinophils Absolute 0.1 0.0 - 0.7 K/uL   Basophils Relative 0 %   Basophils Absolute 0.0 0.0 - 0.1 K/uL    Comment: Performed at  [facility], [place] [address], [place], [place] [address]  Comprehensive metabolic panel     Status: Abnormal   Collection Time: 03/07/18  4:56 PM  Result Value Ref Range   Sodium 142 135 - 145 mmol/L   Potassium 4.0 3.5 - 5.1 mmol/L   Chloride 111 98 - 111 mmol/L    Comment: Please note change in reference range.   CO2 24 22 - 32 mmol/L   Glucose, Bld 123 (H) 70 - 99 mg/dL    Comment: Please note change in reference range.   BUN 16 8 - 23 mg/dL    Comment: Please note change in reference range.   Creatinine, Ser 0.83 0.44 - 1.00 mg/dL   Calcium 8.7 (L) 8.9 - 10.3 mg/dL   Total Protein 6.9 6.5 - 8.1 g/dL   Albumin 3.9 3.5 - 5.0 g/dL   AST 34 15 - 41 U/L   ALT 34 0 - 44 U/L    Comment: Please note change in reference range.   Alkaline Phosphatase 68 38 - 126 U/L   Total Bilirubin 0.9 0.3 - 1.2 mg/dL   GFR calc non Af Amer >60 >60 mL/min   GFR calc Af Amer >60 >60 mL/min    Comment: (NOTE) The eGFR has been calculated using the CKD EPI equation. This calculation has not been validated in all clinical  situations. eGFR's persistently <60 mL/min signify possible Chronic Kidney Disease.    Anion gap 7 5 - 15    Comment: Performed at [facility], [place] [address] [place], [place] [address]  Lipase, blood     Status: Abnormal   Collection Time: 03/07/18  4:56 PM  Result Value Ref Range   Lipase 6,422 (H) 11 - 51 U/L    Comment: RESULTS CONFIRMED BY MANUAL DILUTION Performed at [facility], [place] [address], [place], [place] [address]    Dg Abd Acute W/chest  Result Date: 03/07/2018 CLINICAL DATA:  Abdominal pain, nausea and vomiting today. EXAM: DG ABDOMEN ACUTE W/ 1V CHEST COMPARISON:  Chest x-ray 01/01/2018 and CT abdomen/pelvis 01/01/2018 FINDINGS: Lungs are hypoinflated without airspace consolidation or effusion. Cardiomediastinal silhouette and remainder of the chest is unchanged. Bowel gas pattern is nonobstructive. Air-filled nondilated small bowel loops over the left upper quadrant. No free peritoneal air. No mass or mass effect. Surgical clips over the right upper quadrant. Minimal degenerate change of the spine and hips. IMPRESSION: Nonobstructive bowel gas pattern. No acute cardiopulmonary disease. Electronically Signed   By: Marin Olp M.D.   On: 03/07/2018 16:45    Pending Labs Unresulted Labs (From admission, onward)   Start     Ordered   03/14/18 0500  Creatinine, serum  (enoxaparin (LOVENOX)    CrCl >/= 30 ml/min)  Weekly,   R    Comments:  while on enoxaparin therapy    03/07/18 1857   03/08/18 0500  Comprehensive metabolic panel  Tomorrow morning,   R     03/07/18 1857   03/08/18 0500  CBC  Tomorrow morning,   R     03/07/18 1857   03/08/18 0500  Lipid panel  Tomorrow morning,   R     03/07/18 1857   03/07/18 1857  Lactate dehydrogenase  Add-on,   R     03/07/18 1857   03/07/18 1603  Urinalysis, Routine w reflex microscopic  Once,   R     03/07/18 1603      Vitals/Pain Today's Vitals  03/07/18 1645 03/07/18 1800 03/07/18  1807 03/07/18 1900  BP: (!) 121/58 134/75 134/75 (!) 114/56  Pulse: (!) 59 61 (!) 58 67  Resp:   18 17  Temp:      TempSrc:      SpO2: 100% 99% 100% 100%  PainSc:        Isolation Precautions No active isolations  Medications Medications  0.9 %  sodium chloride infusion ( Intravenous New Bag/Given 03/07/18 1812)  enoxaparin (LOVENOX) injection 40 mg (has no administration in time range)  0.9 %  sodium chloride infusion (has no administration in time range)  acetaminophen (TYLENOL) tablet 650 mg (has no administration in time range)    Or  acetaminophen (TYLENOL) suppository 650 mg (has no administration in time range)  HYDROmorphone (DILAUDID) injection 0.5 mg (has no administration in time range)  diphenhydrAMINE (BENADRYL) injection 25 mg (has no administration in time range)  ondansetron (ZOFRAN) injection 4 mg (has no administration in time range)  sodium chloride 0.9 % bolus 1,000 mL (0 mLs Intravenous Stopped 03/07/18 1807)  metoCLOPramide (REGLAN) injection 5 mg (5 mg Intravenous Given 03/07/18 1643)  diphenhydrAMINE (BENADRYL) injection 12.5 mg (12.5 mg Intravenous Given 03/07/18 1643)  fentaNYL (SUBLIMAZE) injection 50 mcg (50 mcg Intravenous Given 03/07/18 1643)  fentaNYL (SUBLIMAZE) injection 50 mcg (50 mcg Intravenous Given 03/07/18 1812)    Mobility walks

## 2018-03-07 NOTE — ED Triage Notes (Signed)
Patient here from home with complaints of abdominal pain, nausea, vomiting. Hx of pancreatitis.

## 2018-03-07 NOTE — ED Provider Notes (Signed)
[facility] DEPT Provider Note   CSN: [id number] Arrival date & time: 03/07/18  1540     History   Chief Complaint Chief Complaint  Patient presents with  . Abdominal Pain    HPI Linda Blackburn is a 67 y.o. female.  67 year old female with history of recurrent pancreatitis from unknown etiology presents with several days of back pain which is now progressed to epigastric pain with associated bilious emesis.  No fever or chills.  No dark or bloody stools.  Pain is been progressive and unresponsive to her home medications.  Pain is similar to her prior episodes of pancreatitis.  Denies any urinary symptoms.  Nothing makes her symptoms better.     Past Medical History:  Diagnosis Date  . Abnormal uterine bleeding   . Amenorrhea   . Anxiety   . Depression   . Gallstones   . Hay fever   . HTN (hypertension)   . Pancreatitis   . Urine incontinence   . UTI (urinary tract infection)    reoccuring     Patient Active Problem List   Diagnosis Date Noted  . Sepsis ([place]) 01/01/2018  . Alternating constipation and diarrhea 09/26/2016  . Obesity (BMI 30-39.9) 07/20/2014  . Chronic insomnia 06/16/2014  . Hypokalemia 06/10/2014  . Volume overload 06/10/2014  . HCAP (healthcare-associated pneumonia) 06/10/2014  . UTI (urinary tract infection) 06/10/2014  . Leukocytosis 06/10/2014  . Acute respiratory failure with hypoxia ([place]) 06/10/2014  . Acute recurrent pancreatitis 06/06/2014  . Pancreatitis 06/06/2014  . Hypertension 09/11/2012  . Adjustment disorder with mixed anxiety and depressed mood 09/11/2012  . Pancreas divisum 01/18/2011  . URI 09/02/2010  . Depression, recurrent ([place]) 04/01/2010  . PANCREATITIS, HX OF 04/01/2010    Past Surgical History:  Procedure Laterality Date  . ABDOMINAL HYSTERECTOMY  1984   fibroids  . APPENDECTOMY  1984  . CHOLECYSTECTOMY  09/08/1999  . ERCP W/ SPHINCTEROTOMY AND BALLOON DILATION  08/2008  . OOPHORECTOMY      Only 1 was taken  . TONSILLECTOMY    . TUBAL LIGATION       OB History    Gravida  4   Para  2   Term  2   Preterm      AB  2   Living  2     SAB      TAB      Ectopic      Multiple      Live Births  2            Home Medications    Prior to Admission medications   Medication Sig Start Date End Date Taking? Authorizing Provider  cetirizine (ZYRTEC) 10 MG tablet Take 10 mg by mouth daily as needed for allergies.     [provider]  famotidine (PEPCID) 20 MG tablet Take 20 mg by mouth daily.     [provider]  losartan (COZAAR) 100 MG tablet Take 1 tablet (100 mg total) by mouth daily. 08/10/17   Burchette, Alinda Sierras, MD  ondansetron (ZOFRAN-ODT) 4 MG disintegrating tablet Take 1 tablet (4 mg total) by mouth every 8 (eight) hours as needed. 01/16/18   Burchette, Alinda Sierras, MD  sertraline (ZOLOFT) 100 MG tablet Take 1 tablet (100 mg total) by mouth daily. 08/10/17   Burchette, Alinda Sierras, MD  zolpidem (AMBIEN) 5 MG tablet Take 1 tablet (5 mg total) by mouth at bedtime as needed for sleep. 08/07/16   Carolann Littler  W, MD    Family History Family History  Problem Relation Age of Onset  . Alcohol abuse Mother   . Prostate cancer Father   . Stroke Maternal Aunt   . Hiatal hernia Maternal Aunt   . Prostate cancer Paternal Grandfather   . Breast cancer Neg Hx     Social History Social History   Tobacco Use  . Smoking status: Never Smoker  . Smokeless tobacco: Never Used  Substance Use Topics  . Alcohol use: Yes    Alcohol/week: 0.0 oz    Comment: occ - hardly every   . Drug use: No     Allergies   Other; Buprenorphine hcl; Codeine; and Morphine and related   Review of Systems Review of Systems  All other systems reviewed and are negative.    Physical Exam Updated Vital Signs BP (!) 146/56 (BP Location: Left Arm)   Pulse 67   Temp 97.7 F (36.5 C) (Oral)   Resp 18   SpO2 100%   Physical Exam  Constitutional: She is  oriented to person, place, and time. She appears well-developed and well-nourished.  Non-toxic appearance. No distress.  HENT:  Head: Normocephalic and atraumatic.  Eyes: Pupils are equal, round, and reactive to light. Conjunctivae, EOM and lids are normal.  Neck: Normal range of motion. Neck supple. No tracheal deviation present. No thyroid mass present.  Cardiovascular: Normal rate, regular rhythm and normal heart sounds. Exam reveals no gallop.  No murmur heard. Pulmonary/Chest: Effort normal and breath sounds normal. No stridor. No respiratory distress. She has no decreased breath sounds. She has no wheezes. She has no rhonchi. She has no rales.  Abdominal: Soft. Normal appearance and bowel sounds are normal. She exhibits no distension. There is tenderness in the epigastric area. There is guarding. There is no rigidity, no rebound and no CVA tenderness.    Musculoskeletal: Normal range of motion. She exhibits no edema or tenderness.  Neurological: She is alert and oriented to person, place, and time. She has normal strength. No cranial nerve deficit or sensory deficit. GCS eye subscore is 4. GCS verbal subscore is 5. GCS motor subscore is 6.  Skin: Skin is warm and dry. No abrasion and no rash noted.  Psychiatric: She has a normal mood and affect. Her speech is normal and behavior is normal.  Nursing note and vitals reviewed.    ED Treatments / Results  Labs (all labs ordered are listed, but only abnormal results are displayed) Labs Reviewed  CBC WITH DIFFERENTIAL/PLATELET  COMPREHENSIVE METABOLIC PANEL  LIPASE, BLOOD  URINALYSIS, ROUTINE W REFLEX MICROSCOPIC    EKG None  Radiology No results found.  Procedures Procedures (including critical care time)  Medications Ordered in ED Medications  sodium chloride 0.9 % bolus 1,000 mL (has no administration in time range)  0.9 %  sodium chloride infusion (has no administration in time range)  metoCLOPramide (REGLAN) injection 5  mg (has no administration in time range)  diphenhydrAMINE (BENADRYL) injection 12.5 mg (has no administration in time range)     Initial Impression / Assessment and Plan / ED Course  I have reviewed the triage vital signs and the nursing notes.  Pertinent labs & imaging results that were available during my care of the patient were reviewed by me and considered in my medical decision making (see chart for details).     Patient given IV fluids and pain medication.  Has lipase over 6000.  Will be admitted to the medicine service Final  Clinical Impressions(s) / ED Diagnoses   Final diagnoses:  None    ED Discharge Orders    None       Lacretia Leigh, MD 03/07/18 1900

## 2018-03-07 NOTE — H&P (Addendum)
TRH H&P   Patient Demographics:    Linda Blackburn, is a 67 y.o. female  MRN: [id number]   DOB - [birth year]  Admit Date - 03/07/2018  Outpatient Primary MD for the patient is [name], MD  Referring MD/NP/PA: Vivi Martens  Outpatient Specialists:     Patient coming from:  home  Chief Complaint  Patient presents with  . Abdominal Pain      HPI:    Linda Blackburn  is a 67 y.o. female, w recurrent pancreatitis,(unclear etiology), h/o ccy,  Hypertension apparently c/o abdominal discomfort after eating this AM. Pt states consistent with prior episodes of pancreatitis.  Had n/v x1 and presented to ED for evaluation. No recent changes in medication.  No OTC medication ingested  In ED Abd xray IMPRESSION: Nonobstructive bowel gas pattern.  No acute cardiopulmonary disease.  Wbc 12.1, hgb 15.2, Plt 245 Na 142, K 4.0, Bun 16, Creatinine 0.83 Ast 34, Alt 34, alk phos 68, T. Bili 0.9  Lipase 6,422  Urinalysis pending  Pt will be admitted for acute pancreatitis , recurrent.      Review of systems:    In addition to the HPI above, No Fever-chills, No Headache, No changes with Vision or hearing, No problems swallowing food or Liquids, No Chest pain, Cough or Shortness of Breath,  No Blood in stool or Urine, No dysuria, No new skin rashes or bruises, No new joints pains-aches,  No new weakness, tingling, numbness in any extremity, No recent weight gain or loss, No polyuria, polydypsia or polyphagia, No significant Mental Stressors.  A full 10 point Review of Systems was done, except as stated above, all other Review of Systems were negative.   With Past History of the following :    Past Medical History:  Diagnosis Date  . Abnormal uterine bleeding   . Amenorrhea   . Anxiety   . Depression   . Gallstones   . Hay fever   . HTN (hypertension)   .  Pancreatitis   . Urine incontinence   . UTI (urinary tract infection)    reoccuring       Past Surgical History:  Procedure Laterality Date  . ABDOMINAL HYSTERECTOMY  1984   fibroids  . APPENDECTOMY  1984  . CHOLECYSTECTOMY  09/08/1999  . ERCP W/ SPHINCTEROTOMY AND BALLOON DILATION  08/2008  . OOPHORECTOMY     Only 1 was taken  . TONSILLECTOMY    . TUBAL LIGATION        Social History:     Social History   Tobacco Use  . Smoking status: Never Smoker  . Smokeless tobacco: Never Used  Substance Use Topics  . Alcohol use: Yes    Alcohol/week: 0.0 oz    Comment: occ - hardly every      Lives - at home  Mobility - walks by self   Family  History :     Family History  Problem Relation Age of Onset  . Alcohol abuse Mother   . Prostate cancer Father   . Stroke Maternal Aunt   . Hiatal hernia Maternal Aunt   . Prostate cancer Paternal Grandfather   . Breast cancer Neg Hx        Home Medications:   Prior to Admission medications   Medication Sig Start Date End Date Taking? Authorizing Provider  cetirizine (ZYRTEC) 10 MG tablet Take 10 mg by mouth daily as needed for allergies.    Yes [provider]  famotidine (PEPCID) 20 MG tablet Take 20 mg by mouth daily.    Yes [provider]  losartan (COZAAR) 100 MG tablet Take 1 tablet (100 mg total) by mouth daily. 08/10/17  Yes Burchette, Alinda Sierras, MD  ondansetron (ZOFRAN-ODT) 4 MG disintegrating tablet Take 1 tablet (4 mg total) by mouth every 8 (eight) hours as needed. 01/16/18  Yes Burchette, Alinda Sierras, MD  sertraline (ZOLOFT) 100 MG tablet Take 1 tablet (100 mg total) by mouth daily. 08/10/17  Yes Burchette, Alinda Sierras, MD  zolpidem (AMBIEN) 5 MG tablet Take 1 tablet (5 mg total) by mouth at bedtime as needed for sleep. 08/07/16  Yes Burchette, Alinda Sierras, MD     Allergies:     Allergies  Allergen Reactions  . Other Hives and Swelling    AQUASONIC [place] GEL  . Buprenorphine Hcl     "In a scarry movie" weird  thoughts  . Codeine     GI upset  . Morphine And Related     "In a scarry movie" weird thoughts     Physical Exam:   Vitals  Blood pressure (!) 114/56, pulse 67, temperature 97.7 F (36.5 C), temperature source Oral, resp. rate 17, SpO2 100 %.   1. General  lying in bed in NAD,   2. Normal affect and insight, Not Suicidal or Homicidal, Awake Alert, Oriented X 3.  3. No F.N deficits, ALL C.Nerves Intact, Strength 5/5 all 4 extremities, Sensation intact all 4 extremities, Plantars down going.  4. Ears and Eyes appear Normal, Conjunctivae clear, PERRLA. Moist Oral Mucosa.  5. Supple Neck, No JVD, No cervical lymphadenopathy appriciated, No Carotid Bruits.  6. Symmetrical Chest wall movement, Good air movement bilaterally, CTAB.  7. RRR, No Gallops, Rubs or Murmurs, No Parasternal Heave.  8. Positive Bowel Sounds, Abdomen Soft, No tenderness, No organomegaly appriciated,No rebound -guarding or rigidity.  9.  No Cyanosis, Normal Skin Turgor, No Skin Rash or Bruise.  10. Good muscle tone,  joints appear normal , no effusions, Normal ROM.  11. No Palpable Lymph Nodes in Neck or Axillae      Data Review:    CBC Recent Labs  Lab 03/07/18 1656  WBC 12.1*  HGB 15.2*  HCT 46.7*  PLT 245  MCV 91.4  MCH 29.7  MCHC 32.5  RDW 14.2  LYMPHSABS 1.4  MONOABS 0.4  EOSABS 0.1  BASOSABS 0.0   ------------------------------------------------------------------------------------------------------------------  Chemistries  Recent Labs  Lab 03/07/18 1656  NA 142  K 4.0  CL 111  CO2 24  GLUCOSE 123*  BUN 16  CREATININE 0.83  CALCIUM 8.7*  AST 34  ALT 34  ALKPHOS 68  BILITOT 0.9   ------------------------------------------------------------------------------------------------------------------ CrCl cannot be calculated (Unknown ideal weight.). ------------------------------------------------------------------------------------------------------------------ No results  for input(s): TSH, T4TOTAL, T3FREE, THYROIDAB in the last 72 hours.  Invalid input(s): FREET3  Coagulation profile No results for input(s): INR, PROTIME  in the last 168 hours. ------------------------------------------------------------------------------------------------------------------- No results for input(s): DDIMER in the last 72 hours. -------------------------------------------------------------------------------------------------------------------  Cardiac Enzymes No results for input(s): CKMB, TROPONINI, MYOGLOBIN in the last 168 hours.  Invalid input(s): CK ------------------------------------------------------------------------------------------------------------------    Component Value Date/Time   BNP 235.8 (H) 01/01/2018 1208     ---------------------------------------------------------------------------------------------------------------  Urinalysis    Component Value Date/Time   COLORURINE YELLOW 01/01/2018 1814   APPEARANCEUR HAZY (A) 01/01/2018 1814   LABSPEC 1.008 01/01/2018 1814   PHURINE 5.0 01/01/2018 1814   GLUCOSEU NEGATIVE 01/01/2018 1814   HGBUR LARGE (A) 01/01/2018 1814   BILIRUBINUR NEGATIVE 01/01/2018 1814   BILIRUBINUR neg 09/14/2016 1158   KETONESUR NEGATIVE 01/01/2018 1814   PROTEINUR NEGATIVE 01/01/2018 1814   UROBILINOGEN negative 09/14/2016 1158   UROBILINOGEN 0.2 06/07/2014 0005   NITRITE NEGATIVE 01/01/2018 1814   LEUKOCYTESUR SMALL (A) 01/01/2018 1814    ----------------------------------------------------------------------------------------------------------------   Imaging Results:    Dg Abd Acute W/chest  Result Date: 03/07/2018 CLINICAL DATA:  Abdominal pain, nausea and vomiting today. EXAM: DG ABDOMEN ACUTE W/ 1V CHEST COMPARISON:  Chest x-ray 01/01/2018 and CT abdomen/pelvis 01/01/2018 FINDINGS: Lungs are hypoinflated without airspace consolidation or effusion. Cardiomediastinal silhouette and remainder of the chest is  unchanged. Bowel gas pattern is nonobstructive. Air-filled nondilated small bowel loops over the left upper quadrant. No free peritoneal air. No mass or mass effect. Surgical clips over the right upper quadrant. Minimal degenerate change of the spine and hips. IMPRESSION: Nonobstructive bowel gas pattern. No acute cardiopulmonary disease. Electronically Signed   By: Marin Olp M.D.   On: 03/07/2018 16:45       Assessment & Plan:    Principal Problem:   Pancreatitis Active Problems:   Hypertension   Acute recurrent pancreatitis   Nausea & vomiting    Acute recurrent pancreatitis NPO Ns iv Check LDH Check Lipid Check lipase in am Dilaudid 0.41m iv q4h prn pain  N/v zofran 499miv q6h prn   Hypertension Cont Losartan     DVT Prophylaxis Lovenox - SCDs   AM Labs Ordered, also please review Full Orders  Family Communication: Admission, patients condition and plan of care including tests being ordered have been discussed with the patient who indicate understanding and agree with the plan and Code Status.  Code Status FULL CODE  Likely DC to  home  Condition GUARDED    Consults called: none  Admission status: inpatient   Time spent in minutes : 60   JaJani Gravel.D on 03/07/2018 at 8:04 PM  Between 7am to 7pm - Pager - [phone] After 7pm go to [email or web address] - password [email or web address] - Office  [phone]

## 2018-03-07 NOTE — ED Notes (Signed)
Bed: WA08 Expected date:  Expected time:  Means of arrival:  Comments: 69F abd pain, hx pancreatitis

## 2018-03-08 ENCOUNTER — Encounter (HOSPITAL_COMMUNITY): Payer: Self-pay

## 2018-03-08 ENCOUNTER — Other Ambulatory Visit: Payer: Self-pay

## 2018-03-08 DIAGNOSIS — K859 Acute pancreatitis without necrosis or infection, unspecified: Principal | ICD-10-CM

## 2018-03-08 LAB — CBC
HEMATOCRIT: 41.8 % (ref 36.0–46.0)
Hemoglobin: 13.4 g/dL (ref 12.0–15.0)
MCH: 29.6 pg (ref 26.0–34.0)
MCHC: 32.1 g/dL (ref 30.0–36.0)
MCV: 92.5 fL (ref 78.0–100.0)
Platelets: 210 10*3/uL (ref 150–400)
RBC: 4.52 MIL/uL (ref 3.87–5.11)
RDW: 14.5 % (ref 11.5–15.5)
WBC: 9.1 10*3/uL (ref 4.0–10.5)

## 2018-03-08 LAB — COMPREHENSIVE METABOLIC PANEL
ALBUMIN: 3.4 g/dL — AB (ref 3.5–5.0)
ALT: 28 U/L (ref 0–44)
ANION GAP: 4 — AB (ref 5–15)
AST: 27 U/L (ref 15–41)
Alkaline Phosphatase: 60 U/L (ref 38–126)
BUN: 16 mg/dL (ref 8–23)
CO2: 26 mmol/L (ref 22–32)
Calcium: 7.9 mg/dL — ABNORMAL LOW (ref 8.9–10.3)
Chloride: 114 mmol/L — ABNORMAL HIGH (ref 98–111)
Creatinine, Ser: 0.79 mg/dL (ref 0.44–1.00)
GFR calc Af Amer: >60 mL/min (ref >60)
GFR calc non Af Amer: >60 mL/min (ref >60)
GLUCOSE: 111 mg/dL — AB (ref 70–99)
POTASSIUM: 4.1 mmol/L (ref 3.5–5.1)
SODIUM: 144 mmol/L (ref 135–145)
Total Bilirubin: 0.6 mg/dL (ref 0.3–1.2)
Total Protein: 6.1 g/dL — ABNORMAL LOW (ref 6.5–8.1)

## 2018-03-08 LAB — URINALYSIS, ROUTINE W REFLEX MICROSCOPIC
BILIRUBIN URINE: NEGATIVE
GLUCOSE, UA: NEGATIVE mg/dL
Hgb urine dipstick: NEGATIVE
KETONES UR: NEGATIVE mg/dL
Leukocytes, UA: NEGATIVE
NITRITE: NEGATIVE
PH: 5 (ref 5.0–8.0)
Protein, ur: NEGATIVE mg/dL
SPECIFIC GRAVITY, URINE: 1.019 (ref 1.005–1.030)

## 2018-03-08 LAB — LIPID PANEL
Cholesterol: 175 mg/dL (ref 0–200)
HDL: 30 mg/dL — ABNORMAL LOW (ref >40)
LDL CALC: 115 mg/dL — AB (ref 0–99)
Total CHOL/HDL Ratio: 5.8 RATIO
Triglycerides: 148 mg/dL (ref <150)
VLDL: 30 mg/dL (ref 0–40)

## 2018-03-08 LAB — LIPASE, BLOOD: LIPASE: 2874 U/L — AB (ref 11–51)

## 2018-03-08 MED ORDER — ONDANSETRON HCL 4 MG/2ML IJ SOLN
4.0000 mg | Freq: Once | INTRAMUSCULAR | Status: AC
  Administered 2018-03-08: 4 mg via INTRAVENOUS
  Filled 2018-03-08: qty 2

## 2018-03-08 MED ORDER — HYDROMORPHONE HCL 1 MG/ML IJ SOLN
0.5000 mg | INTRAMUSCULAR | Status: DC | PRN
  Administered 2018-03-08 – 2018-03-10 (×13): 0.5 mg via INTRAVENOUS
  Filled 2018-03-08 (×13): qty 0.5

## 2018-03-08 MED ORDER — SODIUM CHLORIDE 0.9 % IV SOLN
INTRAVENOUS | Status: DC
  Administered 2018-03-08 – 2018-03-11 (×9): via INTRAVENOUS

## 2018-03-08 NOTE — Consult Note (Addendum)
Consultation  Referring Provider: [facility] hospitalist/james Kim MD Primary Care Physician:  [name], MD Primary Gastroenterologist:  Dr.Pansy Ostrovsky  Reason for Consultation:  Acute pancreatitis /recurrent  HPI: Linda Blackburn is a 67 y.o. female, known to Dr. Ardis Hughs, however not seen in our office since 2016.  Patient has a long history dating back over the past 20 years of multiple recurrences of acute pancreatitis of varying severity.  Patient is undergone prior cholecystectomy, and had remote ERCP with major papilla sphincterotomy.  After she was seen by Dr. Ardis Hughs in 2016 she was referred to Dr. Zenia Resides at [facility] and underwent EUS which did show hyperechoic strands throughout the entire pancreas, and then also had ERCP with goal to do minor papilla sphincterotomy for pancreas divisum which had been documented on prior imaging. At the time of ERCP the minor papilla was not found and it was felt she may not have pancreas disease on.  The ventral pancreatic head appeared normal, she did have a 5 mm ventral pancreatic sphincterotomy done. He has had no change in [place] or severity of episodes of pancreatitis since and actually says over the past year she has had an increase in frequency of pancreatitis with 4 episodes over the past year.  She was hospitalized in April 2019 with acute pancreatitis and at that time had CT which showed diffuse peripancreatic edema and gas at the level of the head felt to be stable no gross fluid collection, possible hepatic steatosis, status post cholecystectomy. He was debilitated after that last episode and actually went to rehab for a few weeks to regain her strength. She had been doing well, has had no changes in diet supplements or medications does not drink any alcohol.  She had onset of recurrent abdominal pain yesterday morning after eating breakfast and says that became progressively severe and associated with nausea vomiting and sweats.  She says she has  been having some pain in her mid back for a couple of days prior to that but did not think it was her pancreas.  She had been taking some Advil and Aleve for that. She was admitted through the ER last night, she did not have any abdominal imaging done. She has been hemodynamically stable, with no criteria concerning for sepsis or Sirs. Labs last night showed lipase of 6422, lipase today 2874. On admit WBC 12.1 hemoglobin 15 hematocrit of 46, BUN and creatinine within normal limits, LFTs within normal limits.  Pain is being controlled currently gets up to a 6 or 7  When pain medication is wearing off.      Past Medical History:  Diagnosis Date  . Abnormal uterine bleeding   . Amenorrhea   . Anxiety   . Depression   . Gallstones   . Hay fever   . HTN (hypertension)   . Pancreatitis   . Urine incontinence   . UTI (urinary tract infection)    reoccuring     Past Surgical History:  Procedure Laterality Date  . ABDOMINAL HYSTERECTOMY  1984   fibroids  . APPENDECTOMY  1984  . CHOLECYSTECTOMY  09/08/1999  . ERCP W/ SPHINCTEROTOMY AND BALLOON DILATION  08/2008  . OOPHORECTOMY     Only 1 was taken  . TONSILLECTOMY    . TUBAL LIGATION      Prior to Admission medications   Medication Sig Start Date End Date Taking? Authorizing Provider  cetirizine (ZYRTEC) 10 MG tablet Take 10 mg by mouth daily as needed for  allergies.    Yes [provider]  famotidine (PEPCID) 20 MG tablet Take 20 mg by mouth daily.    Yes [provider]  losartan (COZAAR) 100 MG tablet Take 1 tablet (100 mg total) by mouth daily. 08/10/17  Yes Burchette, Alinda Sierras, MD  ondansetron (ZOFRAN-ODT) 4 MG disintegrating tablet Take 1 tablet (4 mg total) by mouth every 8 (eight) hours as needed. 01/16/18  Yes Burchette, Alinda Sierras, MD  sertraline (ZOLOFT) 100 MG tablet Take 1 tablet (100 mg total) by mouth daily. 08/10/17  Yes Burchette, Alinda Sierras, MD  zolpidem (AMBIEN) 5 MG tablet Take 1 tablet (5 mg total) by  mouth at bedtime as needed for sleep. 08/07/16  Yes Burchette, Alinda Sierras, MD    Current Facility-Administered Medications  Medication Dose Route Frequency Provider Last Rate Last Dose  . 0.9 %  sodium chloride infusion   Intravenous Continuous Lacretia Leigh, MD   Stopped at 03/07/18 2037  . 0.9 %  sodium chloride infusion   Intravenous Continuous Jani Gravel, MD 175 mL/hr at 03/08/18 0220    . acetaminophen (TYLENOL) tablet 650 mg  650 mg Oral Q6H PRN Jani Gravel, MD       Or  . acetaminophen (TYLENOL) suppository 650 mg  650 mg Rectal Q6H PRN Jani Gravel, MD      . diphenhydrAMINE (BENADRYL) injection 25 mg  25 mg Intravenous Q6H PRN Jani Gravel, MD   25 mg at 03/08/18 8101  . enoxaparin (LOVENOX) injection 40 mg  40 mg Subcutaneous Q24H Jani Gravel, MD   40 mg at 03/07/18 2256  . famotidine (PEPCID) tablet 20 mg  20 mg Oral Daily Jani Gravel, MD   20 mg at 03/08/18 7510  . HYDROmorphone (DILAUDID) injection 0.5 mg  0.5 mg Intravenous Q4H PRN Jani Gravel, MD   0.5 mg at 03/08/18 1014  . loratadine (CLARITIN) tablet 10 mg  10 mg Oral Daily Jani Gravel, MD   10 mg at 03/08/18 2585  . losartan (COZAAR) tablet 100 mg  100 mg Oral Daily Jani Gravel, MD   100 mg at 03/08/18 2778  . ondansetron (ZOFRAN) injection 4 mg  4 mg Intravenous Q6H PRN Jani Gravel, MD   4 mg at 03/08/18 0940  . sertraline (ZOLOFT) tablet 100 mg  100 mg Oral Daily Jani Gravel, MD   100 mg at 03/08/18 2423  . zolpidem (AMBIEN) tablet 5 mg  5 mg Oral QHS PRN Jani Gravel, MD        Allergies as of 03/07/2018 - Review Complete 03/07/2018  Allergen Reaction Noted  . Other Hives and Swelling 10/04/2016  . Buprenorphine hcl  03/20/2016  . Codeine  12/06/2010  . Morphine and related  12/06/2010    Family History  Problem Relation Age of Onset  . Alcohol abuse Mother   . Prostate cancer Father   . Stroke Maternal Aunt   . Hiatal hernia Maternal Aunt   . Prostate cancer Paternal Grandfather   . Breast cancer Neg Hx     Social History    Socioeconomic History  . Marital status: Widowed    Spouse name: Not on file  . Number of children: 2  . Years of education: Not on file  . Highest education level: Not on file  Occupational History  . Occupation: retired  Scientific laboratory technician  . Financial resource strain: Not on file  . Food insecurity:    Worry: Not on file    Inability: Not on file  .  Transportation needs:    Medical: Not on file    Non-medical: Not on file  Tobacco Use  . Smoking status: Never Smoker  . Smokeless tobacco: Never Used  Substance and Sexual Activity  . Alcohol use: Yes    Alcohol/week: 0.0 oz    Comment: occ - hardly every   . Drug use: No  . Sexual activity: Never    Partners: Male    Birth control/protection: Surgical  Lifestyle  . Physical activity:    Days per week: Not on file    Minutes per session: Not on file  . Stress: Not on file  Relationships  . Social connections:    Talks on phone: Not on file    Gets together: Not on file    Attends religious service: Not on file    Active member of club or organization: Not on file    Attends meetings of clubs or organizations: Not on file    Relationship status: Not on file  . Intimate partner violence:    Fear of current or ex partner: Not on file    Emotionally abused: Not on file    Physically abused: Not on file    Forced sexual activity: Not on file  Other Topics Concern  . Not on file  Social History Narrative  . Not on file    Review of Systems: Pertinent positive and negative review of systems were noted in the above HPI section.  All other review of systems was otherwise negative.Marland Kitchen  Physical Exam: Vital signs in last 24 hours: Temp:  [97.7 F (36.5 C)-98.2 F (36.8 C)] 98 F (36.7 C) (07/05 0532) Pulse Rate:  [58-70] 70 (07/05 0532) Resp:  [12-18] 12 (07/05 0532) BP: (114-147)/(56-75) 115/58 (07/05 0532) SpO2:  [99 %-100 %] 99 % (07/05 0532) Weight:  [215 lb (97.5 kg)-217 lb 8 oz (98.7 kg)] 217 lb 8 oz (98.7 kg)  (07/05 0532) Last BM Date: 03/07/18 General:   Alert,  Well-developed, well-nourished, older white female pleasant and cooperative in NAD Head:  Normocephalic and atraumatic. Eyes:  Sclera clear, no icterus.   Conjunctiva pink. Ears:  Normal auditory acuity. Nose:  No deformity, discharge,  or lesions. Mouth:  No deformity or lesions.   Neck:  Supple; no masses or thyromegaly. Lungs:  Clear throughout to auscultation.   No wheezes, crackles, or rhonchi. Heart:  Regular rate and rhythm; no murmurs, clicks, rubs,  or gallops. Abdomen:  Soft, tender across the upper and mid abdomen, bowel sounds are quiet, some mild distention, there is guarding, no rebound, no palpable mass or hepatosplenomegaly Rectal:  Deferred  Msk:  Symmetrical without gross deformities. . Pulses:  Normal pulses noted. Extremities:  Without clubbing or edema. Neurologic:  Alert and  oriented x4;  grossly normal neurologically. Skin:  Intact without significant lesions or rashes.. Psych:  Alert and cooperative. Normal mood and affect.  Intake/Output from previous day: 07/04 0701 - 07/05 0700 In: 1944.2 [I.V.:1944.2] Out: 300 [Urine:300] Intake/Output this shift: No intake/output data recorded.  Lab Results: Recent Labs    03/07/18 1656 03/08/18 0521  WBC 12.1* 9.1  HGB 15.2* 13.4  HCT 46.7* 41.8  PLT 245 210   BMET Recent Labs    03/07/18 1656 03/08/18 0521  NA 142 144  K 4.0 4.1  CL 111 114*  CO2 24 26  GLUCOSE 123* 111*  BUN 16 16  CREATININE 0.83 0.79  CALCIUM 8.7* 7.9*   LFT Recent Labs    03/08/18  0521  PROT 6.1*  ALBUMIN 3.4*  AST 27  ALT 28  ALKPHOS 60  BILITOT 0.6   PT/INR No results for input(s): LABPROT, INR in the last 72 hours. Hepatitis Panel No results for input(s): HEPBSAG, HCVAB, HEPAIGM, HEPBIGM in the last 72 hours.    IMPRESSION:  #48 67 year old white female with history of numerous episodes of recurrent pancreatitis over the past 20 years.  Etiology is not  clear.  She is status post remote cholecystectomy, has had previous major papilla sphincterotomy, and underwent ERCP with pancreatic sphincterotomy at [facility] July 2016.  She was thought to have pancreas divisum, but at the time of that ERCP no definite pancreatic divisum noted him a sphincterotomy was done prophylactically.  Unfortunately she has continued to have recurrent episodes. No definite chronic pancreatitis by prior imaging  Readmitted last night after cute onset of progressively severe pain yesterday followed by nausea and vomiting.  Labs are consistent with recurrent acute pancreatitis. Fortunately she does not meet criteria for SIRS or sepsis and does not appear to have severe pancreatitis at present  #2 history of hypertension #3 depression  PLAN: #1 keep n.p.o. except ice chips #2 aggressive fluid replacement-we will increase to 175 cc/h over the next 24 hours #3 antiemetics and analgesics as doing #4 IV Pepcid twice daily #5 serial labs #6 General supportive management, she has had fairly extensive GI work-up including prior cholecystectomy, major papilla sphincterotomy and ventral pancreatic sphincterotomy.  Meds have been reviewed and no known offenders. Consider addition of pancreatic enzymes when she is taking solid food.  Unfortunately expect she will continue to have periodic episodes requiring hospitalization. Thank  you we will follow with you   Amy Esterwood  03/08/2018, 12:00 PM  ________________________________________________________________________  Velora Heckler GI MD note:  I personally examined the patient, reviewed the data and agree with the assessment and plan described above.  Recurrent acute pancreatitis dating back about 20 years, evaluated by multiple different GI in [place], [place], [place] and here. Gallbladder removed a long time ago.  Pancreatic divisum by MRI, but not confirmed by ERCP [facility] Dr. Lysle Rubens (he did pancreatic sphincterotomy). IgG 4 level 3 years  ago was normal. She does not drink etoh.  She asked about [place] referral. She has family there and will be in town in September. Dr. Arlee Muslim runs the [facility] and I think that consultation there, if she is able, is a very good idea. We we will work towards that goal after this hospital stay; for now I recommend usual treatment for acute pancreatitis: IVfluids, IV/oral pain meds, bowel rest. Will follow along.   Owens Loffler, MD [facility] Pager [phone]

## 2018-03-08 NOTE — Progress Notes (Signed)
Patient transferred via bed to room 1439, negative pressure room, for isolation precautions. Report called to [place], Therapist, sports.  Pt in no distress at time of transfer. Carnella [place] I

## 2018-03-08 NOTE — Progress Notes (Signed)
[facility] Progress Note  Patient: Linda Blackburn [id number]:[id number]   PCP: [name], MD DOB: 06-Sep-1950   DOA: 03/07/2018   DOS: 03/08/2018   Date of Service: the patient was seen and examined on 03/08/2018  Subjective: feeling better, but still has some abdominal pain, no fever or chills, passing gas, no BM  Brief hospital course: Pt. with PMH of recurrent pancreatitis, idiopathic, HTN, cholecystectomy; admitted on 03/07/2018, presented with complaint of abdominal pain, was found to have acute pancreatitis. Currently further plan is supportive measures.  Assessment and Plan: 1.  Acute recurrent pancreatitis. Idiopathic etiology. Patient has had extensive work-up in the past. There was work-up also for possible suspected pancreatic divisum which was also negative. IgG 3 years ago was negative. GI consulted which he recommends continuing current care and outpatient referral to [facility] pancreatic center. Continue with IV fluids, continue PRN Zofran and Dilaudid.  2.  Essential hypertension. We will hold off on the blood pressure medication right now.  3.  Mood disorder. Patient is on Zoloft which is currently continued.  Diet: NPO DVT Prophylaxis: subcutaneous Heparin  Advance goals of care discussion: full code  Family Communication: no family was present at bedside, at the time of interview.    Disposition:  Discharge to home.  Consultants: gastroenterology  Procedures: none  Antibiotics: Anti-infectives (From admission, onward)   None       Objective: Physical Exam: Vitals:   03/07/18 2032 03/07/18 2039 03/08/18 0532 03/08/18 1604  BP:  (!) 147/74 (!) 115/58 121/66  Pulse:  66 70 78  Resp:  18 12 14   Temp:  98.2 F (36.8 C) 98 F (36.7 C) 98.5 F (36.9 C)  TempSrc:  Oral Oral Oral  SpO2:  99% 99% 98%  Weight: 97.5 kg (215 lb)  98.7 kg (217 lb 8 oz)   Height: 5' 1.5" (1.562 m)       Intake/Output Summary (Last 24 hours) at 03/08/2018 1841 Last data filed  at 03/08/2018 1400 Gross per 24 hour  Intake 2209.58 ml  Output 750 ml  Net 1459.58 ml   Filed Weights   03/07/18 2032 03/08/18 0532  Weight: 97.5 kg (215 lb) 98.7 kg (217 lb 8 oz)   General: Alert, Awake and Oriented to Time, Place and Person. Appear in moderate distress, affect appropriate Eyes: PERRL, Conjunctiva normal ENT: Oral Mucosa clear moist. Neck: no JVD, no Abnormal Mass Or lumps Cardiovascular: S1 and S2 Present, no Murmur, Peripheral Pulses Present Respiratory: normal respiratory effort, Bilateral Air entry equal and Decreased, no use of accessory muscle, Clear to Auscultation, no Crackles, no wheezes Abdomen: Bowel Sound present, Soft and no tenderness, no hernia Skin: no redness, no Rash, no induration Extremities: no Pedal edema, no calf tenderness Neurologic: Grossly no focal neuro deficit. Bilaterally Equal motor strength  Data Reviewed: CBC: Recent Labs  Lab 03/07/18 1656 03/08/18 0521  WBC 12.1* 9.1  NEUTROABS 10.2*  --   HGB 15.2* 13.4  HCT 46.7* 41.8  MCV 91.4 92.5  PLT 245 626   Basic Metabolic Panel: Recent Labs  Lab 03/07/18 1656 03/08/18 0521  NA 142 144  K 4.0 4.1  CL 111 114*  CO2 24 26  GLUCOSE 123* 111*  BUN 16 16  CREATININE 0.83 0.79  CALCIUM 8.7* 7.9*    Liver Function Tests: Recent Labs  Lab 03/07/18 1656 03/08/18 0521  AST 34 27  ALT 34 28  ALKPHOS 68 60  BILITOT 0.9 0.6  PROT 6.9 6.1*  ALBUMIN  3.9 3.4*   Recent Labs  Lab 03/07/18 1656 03/08/18 0521  LIPASE 6,422* 2,874*   No results for input(s): AMMONIA in the last 168 hours. Coagulation Profile: No results for input(s): INR, PROTIME in the last 168 hours. Cardiac Enzymes: No results for input(s): CKTOTAL, CKMB, CKMBINDEX, TROPONINI in the last 168 hours. BNP (last 3 results) No results for input(s): PROBNP in the last 8760 hours. CBG: No results for input(s): GLUCAP in the last 168 hours. Studies: No results found.  Scheduled Meds: . enoxaparin  (LOVENOX) injection  40 mg Subcutaneous Q24H  . famotidine  20 mg Oral Daily  . loratadine  10 mg Oral Daily  . losartan  100 mg Oral Daily  . sertraline  100 mg Oral Daily   Continuous Infusions: . sodium chloride 175 mL/hr at 03/08/18 1348   PRN Meds: acetaminophen **OR** acetaminophen, diphenhydrAMINE, HYDROmorphone (DILAUDID) injection, ondansetron (ZOFRAN) IV, zolpidem  Time spent: 35 minutes  Author: Berle Mull, MD [facility] Pager: [phone] 03/08/2018 6:41 PM  If 7PM-7AM, please contact night-coverage at [email or web address], password [facility]

## 2018-03-08 NOTE — Progress Notes (Signed)
Pt reports hx of shingles and thinks she may be having an outbreak on site of right buttocks. RN assessed site and paged Bodenheimer NP to come assess and/or advise. New orders for pt to be placed on airborne and contact precautions, will carry out orders.

## 2018-03-09 LAB — BASIC METABOLIC PANEL
Anion gap: 5 (ref 5–15)
BUN: 11 mg/dL (ref 8–23)
CALCIUM: 7.5 mg/dL — AB (ref 8.9–10.3)
CO2: 22 mmol/L (ref 22–32)
Chloride: 116 mmol/L — ABNORMAL HIGH (ref 98–111)
Creatinine, Ser: 0.75 mg/dL (ref 0.44–1.00)
GFR calc Af Amer: >60 mL/min (ref >60)
GLUCOSE: 80 mg/dL (ref 70–99)
Potassium: 3.6 mmol/L (ref 3.5–5.1)
Sodium: 143 mmol/L (ref 135–145)

## 2018-03-09 LAB — CBC
HCT: 38 % (ref 36.0–46.0)
Hemoglobin: 11.9 g/dL — ABNORMAL LOW (ref 12.0–15.0)
MCH: 29.8 pg (ref 26.0–34.0)
MCHC: 31.3 g/dL (ref 30.0–36.0)
MCV: 95 fL (ref 78.0–100.0)
PLATELETS: 196 10*3/uL (ref 150–400)
RBC: 4 MIL/uL (ref 3.87–5.11)
RDW: 14.9 % (ref 11.5–15.5)
WBC: 9.1 10*3/uL (ref 4.0–10.5)

## 2018-03-09 LAB — LIPASE, BLOOD: Lipase: 793 U/L — ABNORMAL HIGH (ref 11–51)

## 2018-03-09 LAB — IGG 4: IGG 4: 48 mg/dL (ref 2–96)

## 2018-03-09 NOTE — Progress Notes (Addendum)
Patient ID: Linda Blackburn, female   DOB: [birth year], 67 y.o.   MRN: [id number]    Progress Note   Subjective   Feeling better , decrease in pain, no vomiting, thirsty On airborne precautions - has a single lesion on buttock -hx of shingles - lesion not painful, no additional lesions overnight  Lipase 793  Objective   Vital signs in last 24 hours: Temp:  [98.5 F (36.9 C)-99.5 F (37.5 C)] 99 F (37.2 C) (07/06 0538) Pulse Rate:  [78-92] 92 (07/06 0538) Resp:  [14-18] 18 (07/06 0538) BP: (114-129)/(60-66) 114/60 (07/06 0538) SpO2:  [94 %-98 %] 96 % (07/06 0538) Weight:  [216 lb 6.4 oz (98.2 kg)] 216 lb 6.4 oz (98.2 kg) (07/06 0538) Last BM Date: 03/07/18 General:    white female in NAD Heart:  Regular rate and rhythm; no murmurs Lungs: Respirations even and unlabored, lungs CTA bilaterally Abdomen:  Soft, tender diffusely across upper abdomen and nondistended.  BS present  Extremities:  Without edema. Neurologic:  Alert and oriented,  grossly normal neurologically. Psych:  Cooperative. Normal mood and affect.   Lab Results: Recent Labs    03/07/18 1656 03/08/18 0521 03/09/18 0416  WBC 12.1* 9.1 9.1  HGB 15.2* 13.4 11.9*  HCT 46.7* 41.8 38.0  PLT 245 210 196   BMET Recent Labs    03/07/18 1656 03/08/18 0521 03/09/18 0416  NA 142 144 143  K 4.0 4.1 3.6  CL 111 114* 116*  CO2 24 26 22   GLUCOSE 123* 111* 80  BUN 16 16 11   CREATININE 0.83 0.79 0.75  CALCIUM 8.7* 7.9* 7.5*   LFT Recent Labs    03/08/18 0521  PROT 6.1*  ALBUMIN 3.4*  AST 27  ALT 28  ALKPHOS 60  BILITOT 0.6   PT/INR No results for input(s): LABPROT, INR in the last 72 hours.  Studies/Results: Dg Abd Acute W/chest  Result Date: 03/07/2018 CLINICAL DATA:  Abdominal pain, nausea and vomiting today. EXAM: DG ABDOMEN ACUTE W/ 1V CHEST COMPARISON:  Chest x-ray 01/01/2018 and CT abdomen/pelvis 01/01/2018 FINDINGS: Lungs are hypoinflated without airspace consolidation or effusion.  Cardiomediastinal silhouette and remainder of the chest is unchanged. Bowel gas pattern is nonobstructive. Air-filled nondilated small bowel loops over the left upper quadrant. No free peritoneal air. No mass or mass effect. Surgical clips over the right upper quadrant. Minimal degenerate change of the spine and hips. IMPRESSION: Nonobstructive bowel gas pattern. No acute cardiopulmonary disease. Electronically Signed   By: Marin Olp M.D.   On: 03/07/2018 16:45       Assessment / Plan:    #1 67 yo WF with recurrent pancreatitis, idiopathic  With multiple episodes over past 20 years - increased frequency of episodes past year. S/P GB, s/p CBD sphincterotomy, and pancreatic duct sphincterotomy 2016.  Fortunately does not have severe pancreatitis , no worrisome parameters  Improved overnight  #2 single skin lesion at top of buttocks ?? zoster   Plan; start sips clears, ambulate  Continue aggressive volume support Hope to get her in to Pancreatic center  At [facility]  For consult  In September     LOS: 2 days   Amy Esterwood  03/09/2018, 10:36 AM   ________________________________________________________________________  Velora Heckler GI MD note:  I personally examined the patient, reviewed the data and agree with the assessment and plan described above.  Clinically improving, tolerating clears so far. She is ambulating.  Should continue IV fluids, may increase diet tomorrow if she continues feeling  better.  Will work on referral to [place] for mid to late September when she will be up there visiting family.   Owens Loffler, MD [facility] Pager [phone]

## 2018-03-09 NOTE — Progress Notes (Signed)
[facility] Progress Note  Patient: Linda Blackburn WUJ:[id number]   PCP: [name], MD DOB: [birth year]   DOA: 03/07/2018   DOS: 03/09/2018   Date of Service: the patient was seen and examined on 03/09/2018  Subjective: Pain is about the same.  Passing gas.  No bowel movement.  No nausea no vomiting.  No fever no chills. Transfer to another room last night with concern for possible shingles as she had some itching and rash on her right buttock.  Brief hospital course: Pt. with PMH of recurrent pancreatitis, idiopathic, HTN, cholecystectomy; admitted on 03/07/2018, presented with complaint of abdominal pain, was found to have acute pancreatitis. Currently further plan is supportive measures.  Assessment and Plan: 1.  Acute recurrent pancreatitis. Idiopathic etiology. Patient has had extensive work-up in the past. There was work-up also for possible suspected pancreatic divisum which was also negative. IgG4 3 years ago was negative. GI consulted which he recommends continuing current care and outpatient referral to [facility] pancreatic center. Continue with IV fluids, continue PRN Zofran and Dilaudid.  2.  Essential hypertension. We will hold off on the blood pressure medication right now.  3.  Mood disorder. Patient is on Zoloft which is currently continued.  4.  Erythematous patch on right buttock. Does not appear typical for shingles although not crossing the midline right now. No vesicular lesions are no ulcerations no raised areas. For now we will continue to closely monitor, if no development in the next 24 hours discontinue airborne precautions.  Diet: Clear liquid diet DVT Prophylaxis: subcutaneous Heparin  Advance goals of care discussion: full code  Family Communication: no family was present at bedside, at the time of interview.    Disposition:  Discharge to home.  Consultants: gastroenterology  Procedures: none  Antibiotics: Anti-infectives (From admission,  onward)   None       Objective: Physical Exam: Vitals:   03/08/18 1604 03/08/18 2028 03/09/18 0538 03/09/18 1332  BP: 121/66 129/63 114/60   Pulse: 78 81 92 90  Resp: 14 18 18  (!) 22  Temp: 98.5 F (36.9 C) 99.5 F (37.5 C) 99 F (37.2 C) 99.1 F (37.3 C)  TempSrc: Oral Oral Oral Oral  SpO2: 98% 94% 96% 93%  Weight:   98.2 kg (216 lb 6.4 oz)   Height:        Intake/Output Summary (Last 24 hours) at 03/09/2018 1553 Last data filed at 03/09/2018 1528 Gross per 24 hour  Intake 3215 ml  Output 1400 ml  Net 1815 ml   Filed Weights   03/07/18 2032 03/08/18 0532 03/09/18 0538  Weight: 97.5 kg (215 lb) 98.7 kg (217 lb 8 oz) 98.2 kg (216 lb 6.4 oz)   General: Alert, Awake and Oriented to Time, Place and Person. Appear in moderate distress, affect appropriate Eyes: PERRL, Conjunctiva normal ENT: Oral Mucosa clear moist. Neck: no JVD, no Abnormal Mass Or lumps Cardiovascular: S1 and S2 Present, no Murmur, Peripheral Pulses Present Respiratory: normal respiratory effort, Bilateral Air entry equal and Decreased, no use of accessory muscle, Clear to Auscultation, no Crackles, no wheezes Abdomen: Bowel Sound present, Soft and no tenderness, no hernia Skin: Small erythematous patch on the right buttock, no Rash, no induration Extremities: no Pedal edema, no calf tenderness Neurologic: Grossly no focal neuro deficit. Bilaterally Equal motor strength  Data Reviewed: CBC: Recent Labs  Lab 03/07/18 1656 03/08/18 0521 03/09/18 0416  WBC 12.1* 9.1 9.1  NEUTROABS 10.2*  --   --   HGB 15.2*  13.4 11.9*  HCT 46.7* 41.8 38.0  MCV 91.4 92.5 95.0  PLT 245 210 161   Basic Metabolic Panel: Recent Labs  Lab 03/07/18 1656 03/08/18 0521 03/09/18 0416  NA 142 144 143  K 4.0 4.1 3.6  CL 111 114* 116*  CO2 24 26 22   GLUCOSE 123* 111* 80  BUN 16 16 11   CREATININE 0.83 0.79 0.75  CALCIUM 8.7* 7.9* 7.5*    Liver Function Tests: Recent Labs  Lab 03/07/18 1656 03/08/18 0521  AST 34  27  ALT 34 28  ALKPHOS 68 60  BILITOT 0.9 0.6  PROT 6.9 6.1*  ALBUMIN 3.9 3.4*   Recent Labs  Lab 03/07/18 1656 03/08/18 0521 03/09/18 0416  LIPASE 6,422* 2,874* 793*   No results for input(s): AMMONIA in the last 168 hours. Coagulation Profile: No results for input(s): INR, PROTIME in the last 168 hours. Cardiac Enzymes: No results for input(s): CKTOTAL, CKMB, CKMBINDEX, TROPONINI in the last 168 hours. BNP (last 3 results) No results for input(s): PROBNP in the last 8760 hours. CBG: No results for input(s): GLUCAP in the last 168 hours. Studies: No results found.  Scheduled Meds: . enoxaparin (LOVENOX) injection  40 mg Subcutaneous Q24H  . famotidine  20 mg Oral Daily  . loratadine  10 mg Oral Daily  . sertraline  100 mg Oral Daily   Continuous Infusions: . sodium chloride 175 mL/hr at 03/09/18 1212   PRN Meds: acetaminophen **OR** acetaminophen, diphenhydrAMINE, HYDROmorphone (DILAUDID) injection, ondansetron (ZOFRAN) IV, zolpidem  Time spent: 35 minutes  Author: Berle Mull, MD [facility] Pager: [phone] 03/09/2018 3:53 PM  If 7PM-7AM, please contact night-coverage at [email or web address], password [facility]

## 2018-03-10 LAB — MAGNESIUM: Magnesium: 2 mg/dL (ref 1.7–2.4)

## 2018-03-10 LAB — COMPREHENSIVE METABOLIC PANEL WITH GFR
ALT: 24 U/L (ref 0–44)
AST: 23 U/L (ref 15–41)
Albumin: 3 g/dL — ABNORMAL LOW (ref 3.5–5.0)
Alkaline Phosphatase: 52 U/L (ref 38–126)
Anion gap: 5 (ref 5–15)
BUN: 9 mg/dL (ref 8–23)
CO2: 22 mmol/L (ref 22–32)
Calcium: 7.6 mg/dL — ABNORMAL LOW (ref 8.9–10.3)
Chloride: 114 mmol/L — ABNORMAL HIGH (ref 98–111)
Creatinine, Ser: 0.69 mg/dL (ref 0.44–1.00)
GFR calc Af Amer: >60 mL/min
GFR calc non Af Amer: >60 mL/min
Glucose, Bld: 73 mg/dL (ref 70–99)
Potassium: 3.1 mmol/L — ABNORMAL LOW (ref 3.5–5.1)
Sodium: 141 mmol/L (ref 135–145)
Total Bilirubin: 1.3 mg/dL — ABNORMAL HIGH (ref 0.3–1.2)
Total Protein: 5.6 g/dL — ABNORMAL LOW (ref 6.5–8.1)

## 2018-03-10 LAB — CBC WITH DIFFERENTIAL/PLATELET
Basophils Absolute: 0 K/uL (ref 0.0–0.1)
Basophils Relative: 1 %
Eosinophils Absolute: 0.4 K/uL (ref 0.0–0.7)
Eosinophils Relative: 5 %
HCT: 32.9 % — ABNORMAL LOW (ref 36.0–46.0)
Hemoglobin: 10.5 g/dL — ABNORMAL LOW (ref 12.0–15.0)
Lymphocytes Relative: 25 %
Lymphs Abs: 2.1 K/uL (ref 0.7–4.0)
MCH: 29.9 pg (ref 26.0–34.0)
MCHC: 31.9 g/dL (ref 30.0–36.0)
MCV: 93.7 fL (ref 78.0–100.0)
Monocytes Absolute: 0.8 K/uL (ref 0.1–1.0)
Monocytes Relative: 9 %
Neutro Abs: 5.1 K/uL (ref 1.7–7.7)
Neutrophils Relative %: 60 %
Platelets: 183 K/uL (ref 150–400)
RBC: 3.51 MIL/uL — ABNORMAL LOW (ref 3.87–5.11)
RDW: 14.8 % (ref 11.5–15.5)
WBC: 8.4 K/uL (ref 4.0–10.5)

## 2018-03-10 LAB — LIPASE, BLOOD: Lipase: 79 U/L — ABNORMAL HIGH (ref 11–51)

## 2018-03-10 MED ORDER — DIPHENHYDRAMINE HCL 25 MG PO CAPS: 25.0000 mg | ORAL_CAPSULE | Freq: Four times a day (QID) | ORAL | Status: DC | PRN

## 2018-03-10 MED ORDER — HYDROCODONE-ACETAMINOPHEN 5-325 MG PO TABS
1.0000 | ORAL_TABLET | ORAL | Status: DC | PRN
  Administered 2018-03-10 – 2018-03-11 (×5): 1 via ORAL
  Filled 2018-03-10 (×6): qty 1

## 2018-03-10 MED ORDER — POTASSIUM CHLORIDE CRYS ER 20 MEQ PO TBCR
40.0000 meq | EXTENDED_RELEASE_TABLET | Freq: Once | ORAL | Status: AC
  Administered 2018-03-10: 40 meq via ORAL
  Filled 2018-03-10: qty 2

## 2018-03-10 MED ORDER — HYDROMORPHONE HCL 1 MG/ML IJ SOLN
0.5000 mg | INTRAMUSCULAR | Status: DC | PRN
  Administered 2018-03-10 (×2): 0.5 mg via INTRAVENOUS
  Filled 2018-03-10 (×2): qty 0.5

## 2018-03-10 MED ORDER — VALACYCLOVIR HCL 500 MG PO TABS
1000.0000 mg | ORAL_TABLET | Freq: Three times a day (TID) | ORAL | Status: DC
  Administered 2018-03-10 – 2018-03-11 (×4): 1000 mg via ORAL
  Filled 2018-03-10 (×5): qty 2

## 2018-03-10 NOTE — Progress Notes (Signed)
[facility] Progress Note  Patient: Linda Blackburn GGY:[id number]   PCP: [name], MD DOB: Apr 26, 1951   DOA: 03/07/2018   DOS: 03/10/2018   Date of Service: the patient was seen and examined on 03/10/2018  Subjective: Pain improving.  No nausea or vomiting.  Small BM.  No fever no chills. Rashes continues to have some itching.  Brief hospital course: Pt. with PMH of recurrent pancreatitis, idiopathic, HTN, cholecystectomy; admitted on 03/07/2018, presented with complaint of abdominal pain, was found to have acute pancreatitis. Currently further plan is supportive measures.  Assessment and Plan: 1.  Acute recurrent pancreatitis. Idiopathic etiology. Patient has had extensive work-up in the past. There was work-up also for possible suspected pancreatic divisum which was also negative. IgG4 3 years ago was negative. GI consulted which he recommends continuing current care and outpatient referral to [facility] pancreatic center. Continue with IV fluids, continue PRN Zofran and Dilaudid. Add Norco for pain control.  2.  Essential hypertension. We will hold off on the blood pressure medication right now.  3.  Mood disorder. Patient is on Zoloft which is currently continued.  4.  Erythematous patch on right buttock. Most probably shingles. Does not appear typical for shingles although not crossing the midline right now. Patient does have some raised areas concerning for shingles. We will start the patient on Valtrex, complete 7-day treatment course.  Diet: full liquid diet DVT Prophylaxis: subcutaneous Heparin  Advance goals of care discussion: full code  Family Communication: no family was present at bedside, at the time of interview.    Disposition:  Discharge to home.  Consultants: gastroenterology  Procedures: none  Antibiotics: Anti-infectives (From admission, onward)   Start     Dose/Rate Route Frequency Ordered Stop   03/10/18 1000  valACYclovir (VALTREX) tablet  1,000 mg     1,000 mg Oral 3 times daily 03/10/18 0915         Objective: Physical Exam: Vitals:   03/09/18 1332 03/09/18 2222 03/10/18 0515 03/10/18 1453  BP:  (!) 112/59 112/63 115/66  Pulse: 90 83 80 89  Resp: (!) 22 20 20 20   Temp: 99.1 F (37.3 C) 99.3 F (37.4 C) 98.8 F (37.1 C) 98.1 F (36.7 C)  TempSrc: Oral Oral Oral Oral  SpO2: 93% 96% 93% 93%  Weight:      Height:        Intake/Output Summary (Last 24 hours) at 03/10/2018 1549 Last data filed at 03/10/2018 1455 Gross per 24 hour  Intake 2409.17 ml  Output 400 ml  Net 2009.17 ml   Filed Weights   03/07/18 2032 03/08/18 0532 03/09/18 0538  Weight: 97.5 kg (215 lb) 98.7 kg (217 lb 8 oz) 98.2 kg (216 lb 6.4 oz)   General: Alert, Awake and Oriented to Time, Place and Person. Appear in moderate distress, affect appropriate Eyes: PERRL, Conjunctiva normal ENT: Oral Mucosa clear moist. Neck: no JVD, no Abnormal Mass Or lumps Cardiovascular: S1 and S2 Present, no Murmur, Peripheral Pulses Present Respiratory: normal respiratory effort, Bilateral Air entry equal and Decreased, no use of accessory muscle, Clear to Auscultation, no Crackles, no wheezes Abdomen: Bowel Sound present, Soft and no tenderness, no hernia Skin: Small erythematous patch on the right buttock, no Rash, no induration Extremities: no Pedal edema, no calf tenderness Neurologic: Grossly no focal neuro deficit. Bilaterally Equal motor strength  Data Reviewed: CBC: Recent Labs  Lab 03/07/18 1656 03/08/18 0521 03/09/18 0416 03/10/18 0339  WBC 12.1* 9.1 9.1 8.4  NEUTROABS 10.2*  --   --  5.1  HGB 15.2* 13.4 11.9* 10.5*  HCT 46.7* 41.8 38.0 32.9*  MCV 91.4 92.5 95.0 93.7  PLT 245 210 196 960   Basic Metabolic Panel: Recent Labs  Lab 03/07/18 1656 03/08/18 0521 03/09/18 0416 03/10/18 0339  NA 142 144 143 141  K 4.0 4.1 3.6 3.1*  CL 111 114* 116* 114*  CO2 24 26 22 22   GLUCOSE 123* 111* 80 73  BUN 16 16 11 9   CREATININE 0.83 0.79 0.75  0.69  CALCIUM 8.7* 7.9* 7.5* 7.6*  MG  --   --   --  2.0    Liver Function Tests: Recent Labs  Lab 03/07/18 1656 03/08/18 0521 03/10/18 0339  AST 34 27 23  ALT 34 28 24  ALKPHOS 68 60 52  BILITOT 0.9 0.6 1.3*  PROT 6.9 6.1* 5.6*  ALBUMIN 3.9 3.4* 3.0*   Recent Labs  Lab 03/07/18 1656 03/08/18 0521 03/09/18 0416 03/10/18 0339  LIPASE 6,422* 2,874* 793* 79*   No results for input(s): AMMONIA in the last 168 hours. Coagulation Profile: No results for input(s): INR, PROTIME in the last 168 hours. Cardiac Enzymes: No results for input(s): CKTOTAL, CKMB, CKMBINDEX, TROPONINI in the last 168 hours. BNP (last 3 results) No results for input(s): PROBNP in the last 8760 hours. CBG: No results for input(s): GLUCAP in the last 168 hours. Studies: No results found.  Scheduled Meds: . enoxaparin (LOVENOX) injection  40 mg Subcutaneous Q24H  . famotidine  20 mg Oral Daily  . loratadine  10 mg Oral Daily  . sertraline  100 mg Oral Daily  . valACYclovir  1,000 mg Oral TID   Continuous Infusions: . sodium chloride 100 mL/hr at 03/10/18 1303   PRN Meds: acetaminophen **OR** acetaminophen, diphenhydrAMINE, HYDROcodone-acetaminophen, HYDROmorphone (DILAUDID) injection, ondansetron (ZOFRAN) IV, zolpidem  Time spent: 35 minutes  Author: Berle Mull, MD [facility] Pager: [phone] 03/10/2018 3:49 PM  If 7PM-7AM, please contact night-coverage at [email or web address], password [facility]

## 2018-03-10 NOTE — Progress Notes (Addendum)
Patient ID: Linda Blackburn, female   DOB: 04/17/1951, 67 y.o.   MRN: [id number]    Progress Note   Subjective  Feeling better , no nausea or vomiting, keeping some clears down. Pain decreasing Buttocks lesions uncomfortable but not painful Lipase 79 -  Objective   Vital signs in last 24 hours: Temp:  [98.8 F (37.1 C)-99.3 F (37.4 C)] 98.8 F (37.1 C) (07/07 0515) Pulse Rate:  [80-90] 80 (07/07 0515) Resp:  [20-22] 20 (07/07 0515) BP: (112)/(59-63) 112/63 (07/07 0515) SpO2:  [93 %-96 %] 93 % (07/07 0515) Last BM Date: 03/07/18 General:   Older  white female in NAD Heart:  Regular rate and rhythm; no murmurs Lungs: Respirations even and unlabored, lungs CTA bilaterally Abdomen:  Soft, tender across upper abdomen   With some guarding and nondistended. Normal bowel sounds. Extremities:  Without edema. Neurologic:  Alert and oriented,  grossly normal neurologically. Psych:  Cooperative. Normal mood and affect.  Lab Results: Recent Labs    03/08/18 0521 03/09/18 0416 03/10/18 0339  WBC 9.1 9.1 8.4  HGB 13.4 11.9* 10.5*  HCT 41.8 38.0 32.9*  PLT 210 196 183   BMET Recent Labs    03/08/18 0521 03/09/18 0416 03/10/18 0339  NA 144 143 141  K 4.1 3.6 3.1*  CL 114* 116* 114*  CO2 26 22 22   GLUCOSE 111* 80 73  BUN 16 11 9   CREATININE 0.79 0.75 0.69  CALCIUM 7.9* 7.5* 7.6*   LFT Recent Labs    03/10/18 0339  PROT 5.6*  ALBUMIN 3.0*  AST 23  ALT 24  ALKPHOS 52  BILITOT 1.3*   PT/INR No results for input(s): LABPROT, INR in the last 72 hours.     Assessment / Plan:    #1 67 yo WF with numerous prior episodes of acute pancreatitis admitted with acute pancreatitis on 7/4  Etiology not clear - s/p GB, s/p CBD sphincterotomy, s/p Pancreatic duct sphincterotomy, prior IGg4 normal   She is improving, all parameters stable  #2 Right buttocks lesions - additional lesions this am , and similar pattern that she had with Shingles in April - to start Valtrex today    Plan; Agree with low fat full liquids, transition to oral  pain meds as tolerates Decrease IV fluids Hopefully home in next 24-48 hours Will coordinate referral to [facility]  For her as outpt.    Contact  Amy Magness, P.A.-C               [phone]   ________________________________________________________________________  Velora Heckler GI MD note:  I personally examined the patient, reviewed the data and agree with the assessment and plan described above.  Mild acute (chronically recurrent) pancreatitis improving. Advancing diet.  Probably safe for d/c tomorrow, we will check in on her in the AM.  My office ([place]) is already working on the [employer].     Owens Loffler, MD [facility] Gastroenterology Pager [phone]

## 2018-03-11 ENCOUNTER — Telehealth: Payer: Self-pay

## 2018-03-11 DIAGNOSIS — K8500 Idiopathic acute pancreatitis without necrosis or infection: Secondary | ICD-10-CM

## 2018-03-11 LAB — COMPREHENSIVE METABOLIC PANEL
ALT: 33 U/L (ref 0–44)
AST: 32 U/L (ref 15–41)
Albumin: 2.8 g/dL — ABNORMAL LOW (ref 3.5–5.0)
Alkaline Phosphatase: 50 U/L (ref 38–126)
Anion gap: 3 — ABNORMAL LOW (ref 5–15)
BUN: 5 mg/dL — AB (ref 8–23)
CHLORIDE: 115 mmol/L — AB (ref 98–111)
CO2: 22 mmol/L (ref 22–32)
CREATININE: 0.59 mg/dL (ref 0.44–1.00)
Calcium: 7.8 mg/dL — ABNORMAL LOW (ref 8.9–10.3)
GFR calc Af Amer: >60 mL/min (ref >60)
Glucose, Bld: 90 mg/dL (ref 70–99)
POTASSIUM: 3.3 mmol/L — AB (ref 3.5–5.1)
SODIUM: 140 mmol/L (ref 135–145)
Total Bilirubin: 0.8 mg/dL (ref 0.3–1.2)
Total Protein: 5.6 g/dL — ABNORMAL LOW (ref 6.5–8.1)

## 2018-03-11 MED ORDER — HYDROCODONE-ACETAMINOPHEN 5-325 MG PO TABS: 1.0000 | ORAL_TABLET | ORAL | 0 refills | Status: DC | PRN

## 2018-03-11 MED ORDER — VALACYCLOVIR HCL 1 G PO TABS: 1000.0000 mg | ORAL_TABLET | Freq: Three times a day (TID) | ORAL | 0 refills | Status: AC

## 2018-03-11 MED ORDER — POTASSIUM CHLORIDE CRYS ER 20 MEQ PO TBCR
40.0000 meq | EXTENDED_RELEASE_TABLET | Freq: Once | ORAL | Status: AC
  Administered 2018-03-11: 40 meq via ORAL
  Filled 2018-03-11: qty 2

## 2018-03-11 NOTE — Progress Notes (Signed)
Pt to be discharged to home this afternoon. Discharge Instructions reviewed including Medications and schedules with Pt. Pt verbalizes understanding of all discharge teaching and instructions. Discharge Packet with Pt at time of discharge.

## 2018-03-11 NOTE — Discharge Instructions (Signed)
Shingles Shingles, which is also known as herpes zoster, is an infection that causes a painful skin rash and fluid-filled blisters. Shingles is not related to genital herpes, which is a sexually transmitted infection. Shingles only develops in people who:  Have had chickenpox.  Have received the chickenpox vaccine. (This is rare.)  What are the causes? Shingles is caused by varicella-zoster virus (VZV). This is the same virus that causes chickenpox. After exposure to VZV, the virus stays in the body in an inactive (dormant) state. Shingles develops if the virus reactivates. This can happen many years after the initial exposure to VZV. It is not known what causes this virus to reactivate. What increases the risk? People who have had chickenpox or received the chickenpox vaccine are at risk for shingles. Infection is more common in people who:  Are older than age 50.  Have a weakened defense (immune) system, such as those with HIV, AIDS, or cancer.  Are taking medicines that weaken the immune system, such as transplant medicines.  Are under great stress.  What are the signs or symptoms? Early symptoms of this condition include itching, tingling, and pain in an area on your skin. Pain may be described as burning, stabbing, or throbbing. A few days or weeks after symptoms start, a painful red rash appears, usually on one side of the body in a bandlike or beltlike pattern. The rash eventually turns into fluid-filled blisters that break open, scab over, and dry up in about 2-3 weeks. At any time during the infection, you may also develop:  A fever.  Chills.  A headache.  An upset stomach.  How is this diagnosed? This condition is diagnosed with a skin exam. Sometimes, skin or fluid samples are taken from the blisters before a diagnosis is made. These samples are examined under a microscope or sent to a lab for testing. How is this treated? There is no specific cure for this condition.  Your health care provider will probably prescribe medicines to help you manage pain, recover more quickly, and avoid long-term problems. Medicines may include:  Antiviral drugs.  Anti-inflammatory drugs.  Pain medicines.  If the area involved is on your face, you may be referred to a specialist, such as an eye doctor (ophthalmologist) or an ear, nose, and throat (ENT) doctor to help you avoid eye problems, chronic pain, or disability. Follow these instructions at home: Medicines  Take medicines only as directed by your health care provider.  Apply an anti-itch or numbing cream to the affected area as directed by your health care provider. Blister and Rash Care  Take a cool bath or apply cool compresses to the area of the rash or blisters as directed by your health care provider. This may help with pain and itching.  Keep your rash covered with a loose bandage (dressing). Wear loose-fitting clothing to help ease the pain of material rubbing against the rash.  Keep your rash and blisters clean with mild soap and cool water or as directed by your health care provider.  Check your rash every day for signs of infection. These include redness, swelling, and pain that lasts or increases.  Do not pick your blisters.  Do not scratch your rash. General instructions  Rest as directed by your health care provider.  Keep all follow-up visits as directed by your health care provider. This is important.  Until your blisters scab over, your infection can cause chickenpox in people who have never had it or been vaccinated   against it. To prevent this from happening, avoid contact with other people, especially: ? Babies. ? Pregnant women. ? Children who have eczema. ? Elderly people who have transplants. ? People who have chronic illnesses, such as leukemia or AIDS. Contact a health care provider if:  Your pain is not relieved with prescribed medicines.  Your pain does not get better after  the rash heals.  Your rash looks infected. Signs of infection include redness, swelling, and pain that lasts or increases. Get help right away if:  The rash is on your face or nose.  You have facial pain, pain around your eye area, or loss of feeling on one side of your face.  You have ear pain or you have ringing in your ear.  You have loss of taste.  Your condition gets worse. This information is not intended to replace advice given to you by your health care provider. Make sure you discuss any questions you have with your health care provider. Document Released: 08/21/2005 Document Revised: 04/16/2016 Document Reviewed: 07/02/2014 Elsevier Interactive Patient Education  2018 Elsevier Inc.  

## 2018-03-11 NOTE — Telephone Encounter (Signed)
-----   Message from Milus Banister, MD sent at 03/09/2018  1:08 PM EDT ----- Can you see about referral to Dr. Arlee Muslim at [facility] for 20 years of recurrent acute pancreatitis. Send our records (OV 2016, imaging. Also records from [facility] 2016 or 17).  She is going to be up there in mid to late September visiting family and would like a consultation.   Currently admitted to [facility], should be discharged in 1-2 days or so.  Thanks

## 2018-03-11 NOTE — Care Management Important Message (Signed)
Important Message  Patient Details  Name: Channelle Bottger MRN: [id number] Date of Birth: Nov 02, 1950   Medicare Important Message Given:  Yes    Kerin Salen 03/11/2018, 1:56 PMImportant Message  Patient Details  Name: Talah Cookston MRN: [id number] Date of Birth: [birth year]   Medicare Important Message Given:  Yes    Kerin Salen 03/11/2018, 1:56 PM

## 2018-03-11 NOTE — Progress Notes (Addendum)
    Progress Note   Subjective  Chief Complaint: Acute pancreatitis  Patient continues to feel better this morning, does continue with some nausea if she tries to eat too much.  Also admits to an increased amount of urinating but believes this is due to high amount of IV fluids which were stopped this morning by the hospitalist.  Tells me she is happy to go home as she feels like she could do just as well there.  Is aware of plans to see specialist in [place].   Objective   Vital signs in last 24 hours: Temp:  [98 F (36.7 C)-98.9 F (37.2 C)] 98 F (36.7 C) (07/08 0541) Pulse Rate:  [80-89] 80 (07/08 0541) Resp:  [18-20] 20 (07/08 0541) BP: (115-154)/(66-81) 154/81 (07/08 0541) SpO2:  [93 %-94 %] 94 % (07/08 0541) Weight:  [233 lb 0.4 oz (105.7 kg)] 233 lb 0.4 oz (105.7 kg) (07/08 0541) Last BM Date: 03/11/18 General:    white female in NAD Heart:  Regular rate and rhythm; no murmurs Lungs: Respirations even and unlabored, lungs CTA bilaterally Abdomen:  Soft, mild epigastric ttp and nondistended. Normal bowel sounds. Extremities:  Without edema. Neurologic:  Alert and oriented,  grossly normal neurologically. Psych:  Cooperative. Normal mood and affect.  Intake/Output from previous day: 07/07 0701 - 07/08 0700 In: 3267.5 [P.O.:800; I.V.:2467.5] Out: 900 [Urine:900]  Lab Results: Recent Labs    03/09/18 0416 03/10/18 0339  WBC 9.1 8.4  HGB 11.9* 10.5*  HCT 38.0 32.9*  PLT 196 183   BMET Recent Labs    03/09/18 0416 03/10/18 0339 03/11/18 0414  NA 143 141 140  K 3.6 3.1* 3.3*  CL 116* 114* 115*  CO2 22 22 22   GLUCOSE 80 73 90  BUN 11 9 5*  CREATININE 0.75 0.69 0.59  CALCIUM 7.5* 7.6* 7.8*   LFT Recent Labs    03/11/18 0414  PROT 5.6*  ALBUMIN 2.8*  AST 32  ALT 33  ALKPHOS 50  BILITOT 0.8    Assessment / Plan:   Assessment: 1.  Acute pancreatitis: Numerous prior episodes of acute pancreatitis admitted with acute pancreatitis again on 7/4,  etiology not clear-status post CABG, status post CBD sphincterotomy, status post pancreatic duct sphincterotomy, prior IgG4 normal, now improving 2.  Right buttocks lesion: Thought to be shingles, started on Valtrex 03/10/2018  Plan: 1.  Okay with patient's discharge home today.  This is what patient is requesting. 2.  Continue oral pain meds at home and low-fat diet with increase as tolerated 3.  We will contact patient in regards to appointment with the [facility] 4.  We will sign off.  Discharge Planning Diet: Continue low-fat, increase as tolerated at home Discharge Medications: Continue pain medication Follow up: With [facility] specialist.  We will contact patient in regards to appointment.   LOS: 4 days   Levin Erp  03/11/2018, 9:54 AM  Pager # [phone]   Attending physician's note   I have taken an interval history, reviewed the chart and examined the patient. I agree with the Advanced Practitioner's note, impression and recommendations.   Damaris Hippo , MD [phone]

## 2018-03-11 NOTE — Telephone Encounter (Signed)
I called the number below and was transferred to another number that was not in service.  I called back to [phone] and was told that Dr Lynnell Chad is no longer seeing patients in the office.

## 2018-03-11 NOTE — Telephone Encounter (Signed)
Contact [facility] for referrals Address  [facility], [place] [address] [place] [address] [phone]  [phone] Fax: [phone] E-mail: [email or web address]

## 2018-03-12 ENCOUNTER — Telehealth: Payer: Self-pay | Admitting: *Deleted

## 2018-03-12 NOTE — Telephone Encounter (Signed)
Transition Care Management Follow-up Telephone Call   Date discharged? 03/11/18   How have you been since you were released from the hospital? "Last night wasn't too bad. About every 4 hours I have to go to the bathroom because I had so much fluid. Today I'm really nauseous so I'm kind of just laying around."   Do you understand why you were in the hospital? yes   Do you understand the discharge instructions? yes   Where were you discharged to? Home   Items Reviewed:  Medications reviewed: yes  Allergies reviewed: yes  Dietary changes reviewed: yes  Referrals reviewed: yes, Linda Blackburn, gastroenterology. They are also trying to get her in with pancreatic specialist at [facility].   Functional Questionnaire:   Activities of Daily Living (ADLs):   She states they are independent in the following: ambulation, bathing and hygiene, feeding, continence, grooming, toileting and dressing States they require assistance with the following: none   Any transportation issues/concerns?: no   Any patient concerns? yes, patient dx w/ shingles in the hospital and given Valtrex. States this happened another time when she was in the hospital. She would like to discuss if/when to get Shingrix vaccine.   Confirmed importance and date/time of follow-up visits scheduled yes  Provider Appointment booked with Dr. Carolann Blackburn 03/19/18 @ 10:30 am   Confirmed with patient if condition begins to worsen call PCP or go to the ER.  Patient was given the office number and encouraged to call back with question or concerns.  : yes

## 2018-03-12 NOTE — Telephone Encounter (Signed)
Ok, can you see if [facility] are able to see her then?  thanks

## 2018-03-14 NOTE — Telephone Encounter (Signed)
I have left several messages to try and get pt referred.  Message again left on [phone]

## 2018-03-15 NOTE — Telephone Encounter (Signed)
I received a call from [place] at the [facility] and she provided a fax number to send records for review.  [phone] her direct phone number is [phone].

## 2018-03-15 NOTE — Discharge Summary (Signed)
[facility] Discharge Summary   Patient: Linda Blackburn TZG:[id number]   PCP: [name], MD DOB: 04/27/1951   Date of admission: 03/07/2018   Date of discharge: 03/11/2018    Discharge Diagnoses:  Principal Problem:   Pancreatitis Active Problems:   Hypertension   Acute recurrent pancreatitis   Nausea & vomiting   Admitted From: home Disposition:  home  Recommendations for Outpatient Follow-up:  1. Please follow-up with PCP in 1 week.  Follow-up Information    Burchette, Alinda Sierras, MD. Schedule an appointment as soon as possible for a visit in 1 week(s).   Specialty:  Family Medicine Contact information: [place] [address] [phone]          Diet recommendation: Soft diet, low-fat  Activity: The patient is advised to gradually reintroduce usual activities.  Discharge Condition: good  Code Status: FULL CODE  History of present illness: As per the H and P dictated on admission, " Linda Blackburn  is a 67 y.o. female, w recurrent pancreatitis,(unclear etiology), h/o ccy,  Hypertension apparently c/o abdominal discomfort after eating this AM. Pt states consistent with prior episodes of pancreatitis.  Had n/v x1 and presented to ED for evaluation. No recent changes in medication.  No OTC medication ingested "  Hospital Course:  Summary of her active problems in the hospital is as following. 1.  Acute recurrent pancreatitis. Idiopathic etiology. Patient has had extensive work-up in the past. There was work-up also for possible suspected pancreatic divisum which was also negative. IgG4 3 years ago was negative. GI consulted which he recommends continuing current care and outpatient referral to [facility].  2.  Essential hypertension. We will hold off on the blood pressure medication right now.  3.  Mood disorder. Patient is on Zoloft which is currently continued.  4.  Erythematous patch on right buttock. Most  probably shingles. Does not appear typical for shingles although not crossing the midline right now. Patient does have some raised areas concerning for shingles. It is unusual to have 2 episodes of shingles cannot rule out the possibility and therefore We will start the patient on Valtrex, complete 7-day treatment course.   All other chronic medical condition were stable during the hospitalization.  Patient was ambulatory without any assistance. On the day of the discharge the patient's vitals were stable , and no other acute medical condition were reported by patient. the patient was felt safe to be discharge at home with family.  Consultants: gastroenterology  Procedures: none  DISCHARGE MEDICATION: Allergies as of 03/11/2018      Reactions   Other Hives, Swelling   AQUASONIC [place] GEL   Buprenorphine Hcl    "In a scarry movie" weird thoughts   Codeine    GI upset   Morphine And Related    "In a scarry movie" weird thoughts      Medication List    TAKE these medications   cetirizine 10 MG tablet Commonly known as:  ZYRTEC Take 10 mg by mouth daily as needed for allergies.   famotidine 20 MG tablet Commonly known as:  PEPCID Take 20 mg by mouth daily. Notes to patient:  Last Dose of this medication was taken on 03/11/2018 Next dose due 03/12/2018 in the morning   HYDROcodone-acetaminophen 5-325 MG tablet Commonly known as:  NORCO/VICODIN Take 1 tablet by mouth every 4 (four) hours as needed for moderate pain or severe pain. Notes to patient:  Last Dose of this Medication was  taken on 03/11/2018 at 8:09 am. This medication is every 4 hours as needed for pain.    losartan 100 MG tablet Commonly known as:  COZAAR Take 1 tablet (100 mg total) by mouth daily.   ondansetron 4 MG disintegrating tablet Commonly known as:  ZOFRAN-ODT Take 1 tablet (4 mg total) by mouth every 8 (eight) hours as needed.   sertraline 100 MG tablet Commonly known as:  ZOLOFT Take 1 tablet (100 mg  total) by mouth daily. Notes to patient:  Last Dose of this medication was taken on 03/11/2018 at 9:11 am Next Dose is due on 03/12/2018 in the morning   valACYclovir 1000 MG tablet Commonly known as:  VALTREX Take 1 tablet (1,000 mg total) by mouth 3 (three) times daily for 5 days. Notes to patient:  Last Dose of this Medication was taken on 03/11/2018 at 9:11 am and this medication is to be taken 3 times daily for 5 days   zolpidem 5 MG tablet Commonly known as:  AMBIEN Take 1 tablet (5 mg total) by mouth at bedtime as needed for sleep.      Allergies  Allergen Reactions  . Other Hives and Swelling    AQUASONIC [place] GEL  . Buprenorphine Hcl     "In a scarry movie" weird thoughts  . Codeine     GI upset  . Morphine And Related     "In a scarry movie" weird thoughts   Discharge Instructions    Diet - low sodium heart healthy   Complete by:  As directed    Discharge instructions   Complete by:  As directed    It is important that you read following instructions as well as go over your medication list with RN to help you understand your care after this hospitalization.  Discharge Instructions: Please follow-up with PCP in one week  Please request your primary care physician to go over all Hospital Tests and Procedure/Radiological results at the follow up,  Please get all Hospital records sent to your PCP by signing hospital release before you go home.   Do not drive, operating heavy machinery, perform activities at heights, swimming or participation in water activities or provide baby sitting services while you are on Pain, Sleep and Anxiety Medications; until you have been seen by Primary Care Physician or a Neurologist and advised to do so again. Do not take more than prescribed Pain, Sleep and Anxiety Medications. You were cared for by a hospitalist during your hospital stay. If you have any questions about your discharge medications or the care you received while you were in the  hospital after you are discharged, you can call the unit and ask to speak with the hospitalist on call if the hospitalist that took care of you is not available.  Once you are discharged, your primary care physician will handle any further medical issues. Please note that NO REFILLS for any discharge medications will be authorized once you are discharged, as it is imperative that you return to your primary care physician (or establish a relationship with a primary care physician if you do not have one) for your aftercare needs so that they can reassess your need for medications and monitor your lab values. You Must read complete instructions/literature along with all the possible adverse reactions/side effects for all the Medicines you take and that have been prescribed to you. Take any new Medicines after you have completely understood and accept all the possible adverse reactions/side effects. Wear  Seat belts while driving. If you have smoked or chewed Tobacco in the last 2 yrs please stop smoking and/or stop any Recreational drug use.   Increase activity slowly   Complete by:  As directed      Discharge Exam: Filed Weights   03/08/18 0532 03/09/18 0538 03/11/18 0541  Weight: 98.7 kg (217 lb 8 oz) 98.2 kg (216 lb 6.4 oz) 105.7 kg (233 lb 0.4 oz)   Vitals:   03/11/18 0541 03/11/18 1344  BP: (!) 154/81 (!) 175/85  Pulse: 80 84  Resp: 20 20  Temp: 98 F (36.7 C) 98.8 F (37.1 C)  SpO2: 94% 96%   General: Appear in no distress, no Rash; Oral Mucosa moist. Cardiovascular: S1 and S2 Present, no Murmur, no JVD Respiratory: Bilateral Air entry present and Clear to Auscultation, no Crackles, n wheezes Abdomen: Bowel Sound present, Soft and mild tenderness Extremities: no Pedal edema, no calf tenderness Neurology: Grossly no focal neuro deficit.  The results of significant diagnostics from this hospitalization (including imaging, microbiology, ancillary and laboratory) are listed below for  reference.    Significant Diagnostic Studies: Dg Abd Acute W/chest  Result Date: 03/07/2018 CLINICAL DATA:  Abdominal pain, nausea and vomiting today. EXAM: DG ABDOMEN ACUTE W/ 1V CHEST COMPARISON:  Chest x-ray 01/01/2018 and CT abdomen/pelvis 01/01/2018 FINDINGS: Lungs are hypoinflated without airspace consolidation or effusion. Cardiomediastinal silhouette and remainder of the chest is unchanged. Bowel gas pattern is nonobstructive. Air-filled nondilated small bowel loops over the left upper quadrant. No free peritoneal air. No mass or mass effect. Surgical clips over the right upper quadrant. Minimal degenerate change of the spine and hips. IMPRESSION: Nonobstructive bowel gas pattern. No acute cardiopulmonary disease. Electronically Signed   By: Marin Olp M.D.   On: 03/07/2018 16:45    Microbiology: No results found for this or any previous visit (from the past 240 hour(s)).   Labs: CBC: Recent Labs  Lab 03/09/18 0416 03/10/18 0339  WBC 9.1 8.4  NEUTROABS  --  5.1  HGB 11.9* 10.5*  HCT 38.0 32.9*  MCV 95.0 93.7  PLT 196 595   Basic Metabolic Panel: Recent Labs  Lab 03/09/18 0416 03/10/18 0339 03/11/18 0414  NA 143 141 140  K 3.6 3.1* 3.3*  CL 116* 114* 115*  CO2 22 22 22   GLUCOSE 80 73 90  BUN 11 9 5*  CREATININE 0.75 0.69 0.59  CALCIUM 7.5* 7.6* 7.8*  MG  --  2.0  --    Liver Function Tests: Recent Labs  Lab 03/10/18 0339 03/11/18 0414  AST 23 32  ALT 24 33  ALKPHOS 52 50  BILITOT 1.3* 0.8  PROT 5.6* 5.6*  ALBUMIN 3.0* 2.8*   Recent Labs  Lab 03/09/18 0416 03/10/18 0339  LIPASE 793* 79*   BNP (last 3 results) Recent Labs    01/01/18 1208  BNP 235.8*   CBG: No results for input(s): GLUCAP in the last 168 hours. Time spent: 35 minutes  Signed:  Berle Mull  [facility] 03/11/2018 , 6:42 PM

## 2018-03-19 ENCOUNTER — Ambulatory Visit (INDEPENDENT_AMBULATORY_CARE_PROVIDER_SITE_OTHER): Payer: Medicare Other | Admitting: Family Medicine

## 2018-03-19 ENCOUNTER — Encounter: Payer: Self-pay | Admitting: Family Medicine

## 2018-03-19 VITALS — BP 114/74 | HR 103 | Temp 98.3°F | Wt 210.2 lb

## 2018-03-19 DIAGNOSIS — L304 Erythema intertrigo: Secondary | ICD-10-CM

## 2018-03-19 DIAGNOSIS — I10 Essential (primary) hypertension: Secondary | ICD-10-CM

## 2018-03-19 DIAGNOSIS — K859 Acute pancreatitis without necrosis or infection, unspecified: Secondary | ICD-10-CM | POA: Diagnosis not present

## 2018-03-19 DIAGNOSIS — H6122 Impacted cerumen, left ear: Secondary | ICD-10-CM | POA: Diagnosis not present

## 2018-03-19 LAB — CBC WITH DIFFERENTIAL/PLATELET
Basophils Absolute: 0.1 10*3/uL (ref 0.0–0.1)
Basophils Relative: 1.8 % (ref 0.0–3.0)
EOS PCT: 6.2 % — AB (ref 0.0–5.0)
Eosinophils Absolute: 0.4 10*3/uL (ref 0.0–0.7)
HEMATOCRIT: 43 % (ref 36.0–46.0)
HEMOGLOBIN: 14.6 g/dL (ref 12.0–15.0)
LYMPHS ABS: 2.6 10*3/uL (ref 0.7–4.0)
LYMPHS PCT: 42.8 % (ref 12.0–46.0)
MCHC: 33.9 g/dL (ref 30.0–36.0)
MCV: 88.2 fl (ref 78.0–100.0)
MONOS PCT: 10.9 % (ref 3.0–12.0)
Monocytes Absolute: 0.7 10*3/uL (ref 0.1–1.0)
NEUTROS PCT: 38.3 % — AB (ref 43.0–77.0)
Neutro Abs: 2.4 10*3/uL (ref 1.4–7.7)
Platelets: 381 10*3/uL (ref 150.0–400.0)
RBC: 4.87 Mil/uL (ref 3.87–5.11)
RDW: 14.2 % (ref 11.5–15.5)
WBC: 6.2 10*3/uL (ref 4.0–10.5)

## 2018-03-19 LAB — COMPREHENSIVE METABOLIC PANEL
ALBUMIN: 4.3 g/dL (ref 3.5–5.2)
ALT: 33 U/L (ref 0–35)
AST: 34 U/L (ref 0–37)
Alkaline Phosphatase: 72 U/L (ref 39–117)
BILIRUBIN TOTAL: 0.4 mg/dL (ref 0.2–1.2)
BUN: 18 mg/dL (ref 6–23)
CALCIUM: 9.6 mg/dL (ref 8.4–10.5)
CO2: 22 mEq/L (ref 19–32)
Chloride: 106 mEq/L (ref 96–112)
Creatinine, Ser: 0.72 mg/dL (ref 0.40–1.20)
GFR: 85.31 mL/min (ref >60.00)
Glucose, Bld: 79 mg/dL (ref 70–99)
POTASSIUM: 4.3 meq/L (ref 3.5–5.1)
Sodium: 138 mEq/L (ref 135–145)
Total Protein: 7.5 g/dL (ref 6.0–8.3)

## 2018-03-19 MED ORDER — FLUCONAZOLE 100 MG PO TABS: 100.0000 mg | ORAL_TABLET | Freq: Every day | ORAL | 0 refills | Status: DC

## 2018-03-19 MED ORDER — NYSTATIN 100000 UNIT/GM EX OINT: 1.0000 "application " | TOPICAL_OINTMENT | Freq: Two times a day (BID) | CUTANEOUS | 0 refills | Status: DC

## 2018-03-19 NOTE — Patient Instructions (Signed)
Acute Pancreatitis  Acute pancreatitis is a condition in which the pancreas suddenly becomes irritated and swollen (has inflammation). The pancreas is a gland that is located behind the stomach. It produces enzymes that help to digest food. The pancreas also releases the hormones glucagon and insulin, which help to regulate blood sugar. Damage to the pancreas occurs when the digestive enzymes from the pancreas are activated before they are released into the intestine.  Most acute attacks last a couple of days and can cause serious problems. Some people become dehydrated and develop low blood pressure. In severe cases, bleeding into the pancreas can lead to shock and can be life-threatening. The lungs, heart, and kidneys may fail.  What are the causes?  The most common causes of this condition are:  · Alcohol abuse.  · Gallstones.    Other causes include:  · Certain medicines.  · Exposure to certain chemicals.  · Infection.  · Damage caused by an accident (trauma).  · Abdominal surgery.    In some cases, the cause may not be known.  What are the signs or symptoms?  Symptoms of this condition include:  · Pain in the upper abdomen that may radiate to the back.  · Tenderness and swelling of the abdomen.  · Nausea and vomiting.    How is this diagnosed?  This condition may be diagnosed based on:  · A physical exam.  · Blood tests.  · Imaging tests, such as X-rays, CT scans, or an ultrasound of the abdomen.    How is this treated?  Treatment for this condition usually requires a stay in the hospital. Treatment may include:  · Pain medicine.  · Fluid replacement through an IV tube.  · Placing a tube in the stomach to remove stomach contents and to control vomiting (NG tube, or nasogastric tube).  · Not eating for 3-4 days. This gives the pancreas a rest, because enzymes are not being produced that can cause further damage.  · Antibiotic medicines, if your condition is caused by an infection.  · Surgery on the pancreas or  gallbladder.    Follow these instructions at home:  Eating and drinking  · Follow instructions from your health care provider about diet. This may involve avoiding alcohol and decreasing the amount of fat in your diet.  · Eat smaller, more frequent meals. This reduces the amount of digestive fluids that the pancreas produces.  · Drink enough fluid to keep your urine clear or pale yellow.  · Do not drink alcohol if it caused your condition.  General instructions  · Take over-the-counter and prescription medicines only as told by your health care provider.  · Do not use any tobacco products, such as cigarettes, chewing tobacco, and e-cigarettes. If you need help quitting, ask your health care provider.  · Get plenty of rest.  · If directed, check your blood sugar at home as told by your health care provider.  · Keep all follow-up visits as told by your health care provider. This is important.  Contact a health care provider if:  · You do not recover as quickly as expected.  · You develop new or worsening symptoms.  · You have persistent pain, weakness, or nausea.  · You recover and then have another episode of pain.  · You have a fever.  Get help right away if:  · You cannot eat or keep fluids down.  · Your pain becomes severe.  · Your skin or the   white part of your eyes turns yellow (jaundice).  · You vomit.  · You feel dizzy or you faint.  · Your blood sugar is high (over 300 mg/dL).  This information is not intended to replace advice given to you by your health care provider. Make sure you discuss any questions you have with your health care provider.  Document Released: 08/21/2005 Document Revised: 12/29/2015 Document Reviewed: 05/25/2015  Elsevier Interactive Patient Education © 2018 [employer]

## 2018-03-19 NOTE — Progress Notes (Addendum)
Subjective:     Patient ID: Linda Blackburn, female   DOB: [birth year], 67 y.o.   MRN: [id number]  HPI Patient seen for hospital follow-up. She has a long-standing history of recurrent idiopathic pancreatitis. She was admitted on July 4 with recurrent nausea and vomiting and epigastric pain consistent with prior episodes of pancreatitis. Her lipase was over 700. He's had extensive workup in the past. She has pending appointment with pancreatitis specialists and [facility] in September. She's had a couple of previous ERCP procedures. 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    On recent admission initially blood pressure was down and her blood pressure medication was held but then subsequently started after blood pressure started to rise again. She had skin rash right buttock consistent with probable shingles and was started on Valtrex.   Her hemoglobin dropped from 15.2 on admission to 10.5. Suspect this was largely secondary to dilutional factors she was probably dehydrated on admission and she received IV fluids. Her white count remained normal. Albumin was low at 2.8. Calcium 7.8. Potassium 3.3. She's had past history of low potassium. Does not take any diuretics.  Requesting refills of fluconazole and nystatin ointment. She's had recurrent intertrigo type rash in the past. This has worked well for her. Also complains of some left ear fullness. She's had cerumen impactions in the past  Past Medical History:  Diagnosis Date  . Abnormal uterine bleeding   . Amenorrhea   . Anxiety   . Depression   . Gallstones   . Hay fever   . HTN (hypertension)   . Pancreatitis   . Urine incontinence   . UTI (urinary tract infection)    reoccuring    Past Surgical History:  Procedure Laterality Date  . ABDOMINAL HYSTERECTOMY  1984   fibroids  . APPENDECTOMY  1984  . CHOLECYSTECTOMY  09/08/1999  . ERCP W/ SPHINCTEROTOMY AND BALLOON DILATION  08/2008  . OOPHORECTOMY     Only 1 was taken   . TONSILLECTOMY    . TUBAL LIGATION      reports that she has never smoked. She has never used smokeless tobacco. She reports that she drinks alcohol. She reports that she does not use drugs. family history includes Alcohol abuse in her mother; Hiatal hernia in her maternal aunt; Prostate cancer in her father and paternal grandfather; Stroke in her maternal aunt. Allergies  Allergen Reactions  . Other Hives and Swelling    AQUASONIC [place] GEL  . Buprenorphine Hcl     "In a scarry movie" weird thoughts  . Codeine     GI upset  . Morphine And Related     "In a scarry movie" weird thoughts     Review of Systems  Constitutional: Negative for fatigue.  Eyes: Negative for visual disturbance.  Respiratory: Negative for cough, chest tightness, shortness of breath and wheezing.   Cardiovascular: Negative for chest pain, palpitations and leg swelling.  Gastrointestinal: Negative for abdominal pain, blood in stool, diarrhea, nausea and vomiting.  Genitourinary: Negative for dysuria.  Neurological: Negative for dizziness, seizures, syncope, weakness, light-headedness and headaches.       Objective:   Physical Exam  Constitutional: She is oriented to person, place, and time. She appears well-developed and well-nourished.  HENT:  Left ear canal cerumen impaction- removed with irrigation.  Neck: Neck supple.  Cardiovascular: Normal rate and regular rhythm.  Pulmonary/Chest: Breath sounds normal.  Abdominal: Soft. Bowel sounds are normal. She exhibits no distension.  Mild tenderness epigastric region.  No guarding or rebound. No masses.  Musculoskeletal: She exhibits no edema.  Neurological: She is alert and oriented to person, place, and time.       Assessment:     #1 history of recurrent idiopathic pancreatitis currently stable improved following recent admission as above  #2 hypertension stable and at goal  #3 left cerumen impaction removed with irrigation  #4 history of  intertrigo.    Plan:     -Refilled fluconazole and nystatin ointment -Recheck CBC and comprehensive medical panel -Follow-up immediately for any recurrent abdominal pain or other concerns -She has pending follow-up with GI specialists with [facility] in September -left ear irrigation per nurse with removal of cerumen.    [name] MD [facility] at [facility]

## 2018-04-09 ENCOUNTER — Other Ambulatory Visit: Payer: Self-pay | Admitting: Family Medicine

## 2018-04-09 DIAGNOSIS — Z1231 Encounter for screening mammogram for malignant neoplasm of breast: Secondary | ICD-10-CM

## 2018-04-15 DIAGNOSIS — H524 Presbyopia: Secondary | ICD-10-CM | POA: Diagnosis not present

## 2018-04-15 DIAGNOSIS — H2513 Age-related nuclear cataract, bilateral: Secondary | ICD-10-CM | POA: Diagnosis not present

## 2018-04-15 DIAGNOSIS — H5203 Hypermetropia, bilateral: Secondary | ICD-10-CM | POA: Diagnosis not present

## 2018-05-22 DIAGNOSIS — K859 Acute pancreatitis without necrosis or infection, unspecified: Secondary | ICD-10-CM | POA: Diagnosis not present

## 2018-06-08 DIAGNOSIS — K859 Acute pancreatitis without necrosis or infection, unspecified: Secondary | ICD-10-CM | POA: Diagnosis not present

## 2018-06-08 DIAGNOSIS — K5790 Diverticulosis of intestine, part unspecified, without perforation or abscess without bleeding: Secondary | ICD-10-CM | POA: Diagnosis not present

## 2018-06-12 DIAGNOSIS — Z09 Encounter for follow-up examination after completed treatment for conditions other than malignant neoplasm: Secondary | ICD-10-CM | POA: Diagnosis not present

## 2018-06-12 DIAGNOSIS — K859 Acute pancreatitis without necrosis or infection, unspecified: Secondary | ICD-10-CM | POA: Diagnosis not present

## 2018-06-19 ENCOUNTER — Ambulatory Visit
Admission: RE | Admit: 2018-06-19 | Discharge: 2018-06-19 | Disposition: A | Discharge status: Home / Self Care | Payer: Medicare Other | Source: Ambulatory Visit | Attending: Family Medicine | Admitting: Family Medicine

## 2018-06-19 DIAGNOSIS — Z1231 Encounter for screening mammogram for malignant neoplasm of breast: Secondary | ICD-10-CM

## 2018-06-25 ENCOUNTER — Ambulatory Visit (INDEPENDENT_AMBULATORY_CARE_PROVIDER_SITE_OTHER): Payer: Medicare Other | Admitting: *Deleted

## 2018-06-25 DIAGNOSIS — Z23 Encounter for immunization: Secondary | ICD-10-CM | POA: Diagnosis not present

## 2018-08-08 ENCOUNTER — Inpatient Hospital Stay (HOSPITAL_COMMUNITY)
Admission: EM | Admit: 2018-08-08 | Discharge: 2018-08-12 | DRG: 439 | Disposition: A | Discharge status: Home / Self Care | Payer: Medicare Other | Attending: Internal Medicine | Admitting: Internal Medicine

## 2018-08-08 ENCOUNTER — Emergency Department (HOSPITAL_COMMUNITY): Payer: Medicare Other

## 2018-08-08 ENCOUNTER — Encounter (HOSPITAL_COMMUNITY): Payer: Self-pay | Admitting: Emergency Medicine

## 2018-08-08 DIAGNOSIS — E876 Hypokalemia: Secondary | ICD-10-CM | POA: Diagnosis present

## 2018-08-08 DIAGNOSIS — Z823 Family history of stroke: Secondary | ICD-10-CM | POA: Diagnosis not present

## 2018-08-08 DIAGNOSIS — K859 Acute pancreatitis without necrosis or infection, unspecified: Secondary | ICD-10-CM | POA: Diagnosis not present

## 2018-08-08 DIAGNOSIS — E861 Hypovolemia: Secondary | ICD-10-CM | POA: Diagnosis not present

## 2018-08-08 DIAGNOSIS — R109 Unspecified abdominal pain: Secondary | ICD-10-CM | POA: Diagnosis not present

## 2018-08-08 DIAGNOSIS — R001 Bradycardia, unspecified: Secondary | ICD-10-CM | POA: Diagnosis present

## 2018-08-08 DIAGNOSIS — F4323 Adjustment disorder with mixed anxiety and depressed mood: Secondary | ICD-10-CM | POA: Diagnosis present

## 2018-08-08 DIAGNOSIS — I9589 Other hypotension: Secondary | ICD-10-CM | POA: Diagnosis not present

## 2018-08-08 DIAGNOSIS — Z8042 Family history of malignant neoplasm of prostate: Secondary | ICD-10-CM | POA: Diagnosis not present

## 2018-08-08 DIAGNOSIS — K861 Other chronic pancreatitis: Secondary | ICD-10-CM | POA: Diagnosis present

## 2018-08-08 DIAGNOSIS — Z811 Family history of alcohol abuse and dependence: Secondary | ICD-10-CM | POA: Diagnosis not present

## 2018-08-08 DIAGNOSIS — Z9049 Acquired absence of other specified parts of digestive tract: Secondary | ICD-10-CM

## 2018-08-08 DIAGNOSIS — I10 Essential (primary) hypertension: Secondary | ICD-10-CM | POA: Diagnosis present

## 2018-08-08 DIAGNOSIS — I959 Hypotension, unspecified: Secondary | ICD-10-CM | POA: Diagnosis present

## 2018-08-08 DIAGNOSIS — Z885 Allergy status to narcotic agent status: Secondary | ICD-10-CM | POA: Diagnosis not present

## 2018-08-08 DIAGNOSIS — Z90710 Acquired absence of both cervix and uterus: Secondary | ICD-10-CM

## 2018-08-08 DIAGNOSIS — K8500 Idiopathic acute pancreatitis without necrosis or infection: Principal | ICD-10-CM | POA: Diagnosis present

## 2018-08-08 DIAGNOSIS — Z79899 Other long term (current) drug therapy: Secondary | ICD-10-CM

## 2018-08-08 DIAGNOSIS — R1111 Vomiting without nausea: Secondary | ICD-10-CM | POA: Diagnosis not present

## 2018-08-08 DIAGNOSIS — N179 Acute kidney failure, unspecified: Secondary | ICD-10-CM | POA: Diagnosis present

## 2018-08-08 DIAGNOSIS — K429 Umbilical hernia without obstruction or gangrene: Secondary | ICD-10-CM | POA: Diagnosis not present

## 2018-08-08 DIAGNOSIS — I213 ST elevation (STEMI) myocardial infarction of unspecified site: Secondary | ICD-10-CM | POA: Diagnosis not present

## 2018-08-08 DIAGNOSIS — E860 Dehydration: Secondary | ICD-10-CM | POA: Diagnosis present

## 2018-08-08 DIAGNOSIS — K219 Gastro-esophageal reflux disease without esophagitis: Secondary | ICD-10-CM | POA: Diagnosis present

## 2018-08-08 DIAGNOSIS — Z888 Allergy status to other drugs, medicaments and biological substances status: Secondary | ICD-10-CM | POA: Diagnosis not present

## 2018-08-08 DIAGNOSIS — R1010 Upper abdominal pain, unspecified: Secondary | ICD-10-CM | POA: Diagnosis not present

## 2018-08-08 LAB — COMPREHENSIVE METABOLIC PANEL
ALT: 25 U/L (ref 0–44)
ANION GAP: 11 (ref 5–15)
AST: 27 U/L (ref 15–41)
Albumin: 4.1 g/dL (ref 3.5–5.0)
Alkaline Phosphatase: 75 U/L (ref 38–126)
BUN: 14 mg/dL (ref 8–23)
CHLORIDE: 110 mmol/L (ref 98–111)
CO2: 19 mmol/L — ABNORMAL LOW (ref 22–32)
Calcium: 9.4 mg/dL (ref 8.9–10.3)
Creatinine, Ser: 1.16 mg/dL — ABNORMAL HIGH (ref 0.44–1.00)
GFR, EST AFRICAN AMERICAN: 56 mL/min — AB (ref >60)
GFR, EST NON AFRICAN AMERICAN: 48 mL/min — AB (ref >60)
Glucose, Bld: 113 mg/dL — ABNORMAL HIGH (ref 70–99)
POTASSIUM: 3.6 mmol/L (ref 3.5–5.1)
Sodium: 140 mmol/L (ref 135–145)
Total Bilirubin: 0.9 mg/dL (ref 0.3–1.2)
Total Protein: 6.8 g/dL (ref 6.5–8.1)

## 2018-08-08 LAB — CBC
HCT: 46.9 % — ABNORMAL HIGH (ref 36.0–46.0)
Hemoglobin: 15 g/dL (ref 12.0–15.0)
MCH: 29 pg (ref 26.0–34.0)
MCHC: 32 g/dL (ref 30.0–36.0)
MCV: 90.5 fL (ref 80.0–100.0)
NRBC: 0 % (ref 0.0–0.2)
PLATELETS: 253 10*3/uL (ref 150–400)
RBC: 5.18 MIL/uL — ABNORMAL HIGH (ref 3.87–5.11)
RDW: 13.2 % (ref 11.5–15.5)
WBC: 16.8 10*3/uL — ABNORMAL HIGH (ref 4.0–10.5)

## 2018-08-08 LAB — I-STAT CG4 LACTIC ACID, ED: LACTIC ACID, VENOUS: 1.46 mmol/L (ref 0.5–1.9)

## 2018-08-08 LAB — TROPONIN I: Troponin I: <0.03 ng/mL (ref <0.03)

## 2018-08-08 LAB — LIPASE, BLOOD: Lipase: >10000 U/L — ABNORMAL HIGH (ref 11–51)

## 2018-08-08 MED ORDER — FENTANYL CITRATE (PF) 100 MCG/2ML IJ SOLN
100.0000 ug | Freq: Once | INTRAMUSCULAR | Status: AC
  Administered 2018-08-08: 100 ug via INTRAVENOUS
  Filled 2018-08-08: qty 2

## 2018-08-08 MED ORDER — FAMOTIDINE IN NACL 20-0.9 MG/50ML-% IV SOLN
20.0000 mg | Freq: Two times a day (BID) | INTRAVENOUS | Status: DC
  Administered 2018-08-08 – 2018-08-12 (×8): 20 mg via INTRAVENOUS
  Filled 2018-08-08 (×8): qty 50

## 2018-08-08 MED ORDER — NOREPINEPHRINE 4 MG/250ML-% IV SOLN
0.0000 ug/min | INTRAVENOUS | Status: DC
  Administered 2018-08-08: 2 ug/min via INTRAVENOUS

## 2018-08-08 MED ORDER — HYDROMORPHONE HCL 1 MG/ML IJ SOLN
0.5000 mg | INTRAMUSCULAR | Status: DC | PRN
  Administered 2018-08-09: 1 mg via INTRAVENOUS
  Filled 2018-08-08: qty 1

## 2018-08-08 MED ORDER — MORPHINE SULFATE (PF) 4 MG/ML IV SOLN
4.0000 mg | Freq: Once | INTRAVENOUS | Status: DC
  Filled 2018-08-08: qty 1

## 2018-08-08 MED ORDER — IOPAMIDOL (ISOVUE-370) INJECTION 76%
100.0000 mL | Freq: Once | INTRAVENOUS | Status: AC | PRN
  Administered 2018-08-08: 100 mL via INTRAVENOUS

## 2018-08-08 MED ORDER — ONDANSETRON HCL 4 MG/2ML IJ SOLN: 4.0000 mg | Freq: Once | INTRAMUSCULAR | Status: DC

## 2018-08-08 MED ORDER — SODIUM CHLORIDE 0.9 % IV SOLN
INTRAVENOUS | Status: DC
  Administered 2018-08-08: 22:00:00 via INTRAVENOUS

## 2018-08-08 MED ORDER — SODIUM CHLORIDE 0.9 % IV BOLUS
1000.0000 mL | Freq: Once | INTRAVENOUS | Status: AC
  Administered 2018-08-08: 1000 mL via INTRAVENOUS

## 2018-08-08 MED ORDER — NOREPINEPHRINE 4 MG/250ML-% IV SOLN
0.0000 ug/min | INTRAVENOUS | Status: DC
  Administered 2018-08-08: 8 ug/min via INTRAVENOUS

## 2018-08-08 MED ORDER — ONDANSETRON HCL 4 MG/2ML IJ SOLN
4.0000 mg | Freq: Four times a day (QID) | INTRAMUSCULAR | Status: DC | PRN
  Administered 2018-08-09 – 2018-08-12 (×6): 4 mg via INTRAVENOUS
  Filled 2018-08-08 (×7): qty 2

## 2018-08-08 MED ORDER — HYDROMORPHONE HCL 1 MG/ML IJ SOLN
0.5000 mg | Freq: Once | INTRAMUSCULAR | Status: AC
  Administered 2018-08-08: 0.5 mg via INTRAVENOUS
  Filled 2018-08-08: qty 1

## 2018-08-08 MED ORDER — ONDANSETRON HCL 4 MG/2ML IJ SOLN
4.0000 mg | Freq: Once | INTRAMUSCULAR | Status: AC
  Administered 2018-08-08: 4 mg via INTRAVENOUS

## 2018-08-08 MED ORDER — NOREPINEPHRINE 4 MG/250ML-% IV SOLN
INTRAVENOUS | Status: AC
  Filled 2018-08-08: qty 250

## 2018-08-08 MED ORDER — HYDROMORPHONE HCL 1 MG/ML IJ SOLN
1.0000 mg | Freq: Once | INTRAMUSCULAR | Status: AC
  Administered 2018-08-08: 1 mg via INTRAVENOUS
  Filled 2018-08-08: qty 1

## 2018-08-08 NOTE — ED Notes (Signed)
Pt became less responsive, MD at bedside. CT available. Pt continues to have tearing pain down abd.

## 2018-08-08 NOTE — ED Triage Notes (Signed)
Pt went to store, came home complaining of mid abd tearing pain. Pt thought it was her pancreatitis. Called EMS. Pt's HR 40-50, diaphoretic, BP 60 systolic. Pain moved further down abd into pelvis.

## 2018-08-08 NOTE — ED Provider Notes (Signed)
[facility] EMERGENCY DEPARTMENT Provider Note   CSN: [id number] Arrival date & time: 08/08/18  1506     History   Chief Complaint Chief Complaint  Patient presents with  . Abdominal Pain   Level 5 caveat: Altered mental status and acuity of condition HPI Linda Blackburn is a 67 y.o. female.  HPI 67 yo female with a hx of pancreatitis presents the emergency department with sudden onset tearing pain from her abdomen through to her back and radiating down into her legs.  She denies shortness of breath.  She reports some upper chest pain.  On arrival to the emergency department she is bradycardic and hypotensive with pressures down into the 60s.  Noted to move all 4 extremities.  She reports this feels similar to her pancreatitis but that her pain came on suddenly and is the worst she is ever had at this time.  With her bradycardia and hypotension the patient is intermittently responsive.  She is breathing spontaneously.   Past Medical History:  Diagnosis Date  . Abnormal uterine bleeding   . Amenorrhea   . Anxiety   . Depression   . Gallstones   . Hay fever   . HTN (hypertension)   . Pancreatitis   . Urine incontinence   . UTI (urinary tract infection)    reoccuring     Patient Active Problem List   Diagnosis Date Noted  . Nausea & vomiting 03/07/2018  . Sepsis ([place]) 01/01/2018  . Alternating constipation and diarrhea 09/26/2016  . Obesity (BMI 30-39.9) 07/20/2014  . Chronic insomnia 06/16/2014  . Hypokalemia 06/10/2014  . Volume overload 06/10/2014  . HCAP (healthcare-associated pneumonia) 06/10/2014  . UTI (urinary tract infection) 06/10/2014  . Leukocytosis 06/10/2014  . Acute respiratory failure with hypoxia ([place]) 06/10/2014  . Acute recurrent pancreatitis 06/06/2014  . Pancreatitis 06/06/2014  . Hypertension 09/11/2012  . Adjustment disorder with mixed anxiety and depressed mood 09/11/2012  . Pancreas divisum 01/18/2011  . URI 09/02/2010  .  Depression, recurrent ([place]) 04/01/2010  . PANCREATITIS, HX OF 04/01/2010    Past Surgical History:  Procedure Laterality Date  . ABDOMINAL HYSTERECTOMY  1984   fibroids  . APPENDECTOMY  1984  . CHOLECYSTECTOMY  09/08/1999  . ERCP W/ SPHINCTEROTOMY AND BALLOON DILATION  08/2008  . OOPHORECTOMY     Only 1 was taken  . TONSILLECTOMY    . TUBAL LIGATION       OB History    Gravida  4   Para  2   Term  2   Preterm      AB  2   Living  2     SAB      TAB      Ectopic      Multiple      Live Births  2            Home Medications    Prior to Admission medications   Medication Sig Start Date End Date Taking? Authorizing Provider  cetirizine (ZYRTEC) 10 MG tablet Take 10 mg by mouth daily as needed for allergies.    Yes [provider]  famotidine (PEPCID) 20 MG tablet Take 20 mg by mouth daily.    Yes [provider]  losartan (COZAAR) 100 MG tablet Take 1 tablet (100 mg total) by mouth daily. 08/10/17  Yes Burchette, Alinda Sierras, MD  sertraline (ZOLOFT) 100 MG tablet Take 1 tablet (100 mg total) by mouth daily. 08/10/17  Yes Burchette, Bruce  W, MD  fluconazole (DIFLUCAN) 100 MG tablet Take 1 tablet (100 mg total) by mouth daily. Patient not taking: Reported on 08/08/2018 03/19/18   [name], MD  nystatin ointment (MYCOSTATIN) Apply 1 application topically 2 (two) times daily. Patient not taking: Reported on 08/08/2018 03/19/18   [name], MD  ondansetron (ZOFRAN-ODT) 4 MG disintegrating tablet Take 1 tablet (4 mg total) by mouth every 8 (eight) hours as needed. Patient not taking: Reported on 08/08/2018 01/16/18   [name], MD  zolpidem (AMBIEN) 5 MG tablet Take 1 tablet (5 mg total) by mouth at bedtime as needed for sleep. Patient not taking: Reported on 08/08/2018 08/07/16   [name], MD    Family History Family History  Problem Relation Age of Onset  . Alcohol abuse Mother   . Prostate cancer Father   . Stroke  Maternal Aunt   . Hiatal hernia Maternal Aunt   . Prostate cancer Paternal Grandfather   . Breast cancer Neg Hx     Social History Social History   Tobacco Use  . Smoking status: Never Smoker  . Smokeless tobacco: Never Used  Substance Use Topics  . Alcohol use: Yes    Alcohol/week: 0.0 standard drinks    Comment: occ - hardly every   . Drug use: No     Allergies   Other; Buprenorphine hcl; Codeine; and Morphine and related   Review of Systems Review of Systems  Unable to perform ROS: Acuity of condition     Physical Exam Updated Vital Signs BP 121/60   Pulse (!) 57   Resp 20   SpO2 100%   Physical Exam  Constitutional: She appears toxic. She appears ill. No distress.  Diaphoretic  HENT:  Head: Normocephalic and atraumatic.  Eyes: EOM are normal.  Neck: Normal range of motion.  Cardiovascular: Regular rhythm and normal heart sounds.  Bradycardia  Pulmonary/Chest: Effort normal and breath sounds normal.  Abdominal: Soft. She exhibits no distension.  Mild upper abdominal tenderness without guarding or rebound.  Musculoskeletal: Normal range of motion.  Neurological: She is alert.  Answer simple questions.  Moves all 4 extremities.  Skin: Skin is warm and dry.  Psychiatric:  Anxious.  Nursing note and vitals reviewed.    ED Treatments / Results  Labs (all labs ordered are listed, but only abnormal results are displayed) Labs Reviewed  CBC - Abnormal; Notable for the following components:      Result Value   WBC 16.8 (*)    RBC 5.18 (*)    HCT 46.9 (*)    All other components within normal limits  COMPREHENSIVE METABOLIC PANEL - Abnormal; Notable for the following components:   CO2 19 (*)    Glucose, Bld 113 (*)    Creatinine, Ser 1.16 (*)    GFR calc non Af Amer 48 (*)    GFR calc Af Amer 56 (*)    All other components within normal limits  LIPASE, BLOOD - Abnormal; Notable for the following components:   Lipase >10,000 (*)    All other  components within normal limits  TROPONIN I  I-STAT CG4 LACTIC ACID, ED    EKG None  Radiology Ct Angio Chest/abd/pel For Dissection W And/or Wo Contrast  Result Date: 08/08/2018 CLINICAL DATA:  68 year old female with tearing pain in the mid abdomen, hypotension. Evaluate for aortic dissection/aneurysm rupture. Patient has a known history of prior pancreatitis. EXAM: CT ANGIOGRAPHY CHEST, ABDOMEN AND PELVIS TECHNIQUE: Multidetector CT  imaging through the chest, abdomen and pelvis was performed using the standard protocol during bolus administration of intravenous contrast. Multiplanar reconstructed images and MIPs were obtained and reviewed to evaluate the vascular anatomy. CONTRAST:  165mL ISOVUE-370 IOPAMIDOL (ISOVUE-370) INJECTION 76% COMPARISON:  Prior CT scan of the abdomen and pelvis 01/01/2018 FINDINGS: CTA CHEST FINDINGS Cardiovascular: Initial nonenhanced CT imaging demonstrates no evidence of acute intramural hematoma. Following administration of intravenous contrast, the aorta is well opacified. There is no evidence of aneurysm, dissection or other acute abnormality. Two vessel aortic arch anatomy. The right brachiocephalic and left common carotid artery share a common origin. The main pulmonary artery is enlarged with a maximal diameter of 3 cm. Mild cardiomegaly. No pericardial effusion. Mediastinum/Nodes: Unremarkable CT appearance of the thyroid gland. No suspicious mediastinal or hilar adenopathy. No soft tissue mediastinal mass. Small hiatal hernia. Lungs/Pleura: The lungs are essentially clear save for some mild dependent atelectasis in a few scattered calcified granulomas. No edema, pneumothorax or pleural effusion. Musculoskeletal: No acute fracture or aggressive appearing lytic or blastic osseous lesion. Review of the MIP images confirms the above findings. CTA ABDOMEN AND PELVIS FINDINGS VASCULAR Aorta: Normal caliber aorta without aneurysm, dissection, vasculitis or significant  stenosis. Celiac: Patent without evidence of aneurysm, dissection, vasculitis or significant stenosis. SMA: Patent without evidence of aneurysm, dissection, vasculitis or significant stenosis. Renals: Both renal arteries are patent without evidence of aneurysm, dissection, vasculitis, fibromuscular dysplasia or significant stenosis. IMA: Patent without evidence of aneurysm, dissection, vasculitis or significant stenosis. Inflow: Patent without evidence of aneurysm, dissection, vasculitis or significant stenosis. Veins: No obvious venous abnormality within the limitations of this arterial phase study. Review of the MIP images confirms the above findings. NON-VASCULAR Hepatobiliary: Normal hepatic contour and morphology. No discrete hepatic lesion. The gallbladder is surgically absent. There is small volume pneumobilia presumably secondary to prior sphincterotomy. No intra or extrahepatic biliary ductal dilatation. Pancreas: Diffusely abnormal pancreas which is edematous and poorly defined. The peripancreatic stranding is present about the peripancreatic fat and extending into the root of the mesentery. No focal fluid collection or evidence of devascularization. Spleen: Normal in size without focal abnormality. Adrenals/Urinary Tract: Adrenal glands are unremarkable. Kidneys are normal, without renal calculi, focal lesion, or hydronephrosis. Bladder is unremarkable. Stomach/Bowel: No evidence of obstruction or focal bowel wall thickening. The appendix is surgically absent. The terminal ileum is unremarkable. Lymphatic: No suspicious lymphadenopathy. Reproductive: Surgical changes of prior hysterectomy. No adnexal masses. Other: No abdominal wall hernia or abnormality. No abdominopelvic ascites. Musculoskeletal: No acute or significant osseous findings. Review of the MIP images confirms the above findings. IMPRESSION: CTA CHEST 1. No evidence of aortic dissection or other acute arterial abnormality. 2. Mildly enlarged  main pulmonary artery as can be seen in the setting of chronic pulmonary arterial hypertension. 3. Mild cardiomegaly. 4. Small hiatal hernia. CTA ABD/PELVIS 1. No evidence of acute aortic dissection, active hemorrhage or other acute vascular abnormality. 2. Diffusely abnormal pancreatitis which is edematous, poorly defined and demonstrates extensive peripancreatic stranding. Findings are concerning for recurrent acute pancreatitis. 3. Small volume pneumobilia presumably related to prior sphincterotomy and patency of the ampulla of Vater. 4. Small fat containing umbilical hernia. Electronically Signed   By: Jacqulynn Cadet M.D.   On: 08/08/2018 16:21    Procedures .Critical Care Performed by: Jola Schmidt, MD Authorized by: Jola Schmidt, MD    CRITICAL CARE Performed by: Jola Schmidt Total critical care time: 50 minutes Critical care time was exclusive of separately billable procedures and treating  other patients. Critical care was necessary to treat or prevent imminent or life-threatening deterioration. Critical care was time spent personally by me on the following activities: development of treatment plan with patient and/or surrogate as well as nursing, discussions with consultants, evaluation of patient's response to treatment, examination of patient, obtaining history from patient or surrogate, ordering and performing treatments and interventions, ordering and review of laboratory studies, ordering and review of radiographic studies, pulse oximetry and re-evaluation of patient's condition.   Angiocath insertion Performed by: Jola Schmidt Consent: Verbal consent obtained. Risks and benefits: risks, benefits and alternatives were discussed Time out: Immediately prior to procedure a "time out" was called to verify the correct patient, procedure, equipment, support staff and site/side marked as required. Preparation: Patient was prepped and draped in the usual sterile fashion. Vein  Location: left external jugular Gauge: 20 Normal blood return and flush without difficulty Patient tolerance: Patient tolerated the procedure well with no immediate complications.     Medications Ordered in ED Medications  norepinephrine (LEVOPHED) 4-5 MG/250ML-% infusion SOLN (has no administration in time range)  norepinephrine (LEVOPHED) 4mg  in D5W 246mL premix infusion (0 mcg/min Intravenous Stopped 08/08/18 1903)  sodium chloride 0.9 % bolus 1,000 mL (has no administration in time range)  HYDROmorphone (DILAUDID) injection 1 mg (has no administration in time range)  sodium chloride 0.9 % bolus 1,000 mL (0 mLs Intravenous Stopped 08/08/18 1715)  iopamidol (ISOVUE-370) 76 % injection 100 mL (100 mLs Intravenous Contrast Given 08/08/18 1546)  ondansetron (ZOFRAN) injection 4 mg (4 mg Intravenous Given 08/08/18 1609)  fentaNYL (SUBLIMAZE) injection 100 mcg (100 mcg Intravenous Given 08/08/18 1636)  fentaNYL (SUBLIMAZE) injection 100 mcg (100 mcg Intravenous Given 08/08/18 1723)  HYDROmorphone (DILAUDID) injection 0.5 mg (0.5 mg Intravenous Given 08/08/18 2001)     Initial Impression / Assessment and Plan / ED Course  I have reviewed the triage vital signs and the nursing notes.  Pertinent labs & imaging results that were available during my care of the patient were reviewed by me and considered in my medical decision making (see chart for details).    Patient profoundly ill appearing on arrival.  Patient bradycardic and hypotensive with severe chest abdominal and upper back pain.  Patient sent emergently to CT angios chest abdomen pelvis to evaluate for dissection.  Given her persistent hypotension despite IV fluids she was started on Levophed acutely.  CT scan demonstrates no evidence of dissection but does report some inflammatory changes around the pancreas.  After returning from the CT scan her bradycardia and hypotension resolved.  She was able to be weaned off of the levo fed.  She  continued having pain consistent with her prior pancreatitis type pain.  Lipase came back greater than 10,000.  Patient will be admitted for pain control.  Her blood pressures remain soft.  Additional IV fluids have been given.  Patient's mental status improved with resolution of her bradycardia and hypotension.  She states previously she has had hypotension and bradycardia associated with her severe pink otitis episodes.  This is almost as though she is having a prolonged vagal episode.  Final Clinical Impressions(s) / ED Diagnoses   Final diagnoses:  Idiopathic acute pancreatitis without infection or necrosis  Hypotension, unspecified hypotension type    ED Discharge Orders    None       Jola Schmidt, MD 08/09/18 0110

## 2018-08-09 ENCOUNTER — Encounter (HOSPITAL_COMMUNITY): Payer: Self-pay | Admitting: Family Medicine

## 2018-08-09 ENCOUNTER — Other Ambulatory Visit: Payer: Self-pay

## 2018-08-09 DIAGNOSIS — N179 Acute kidney failure, unspecified: Secondary | ICD-10-CM | POA: Diagnosis present

## 2018-08-09 DIAGNOSIS — I9589 Other hypotension: Secondary | ICD-10-CM

## 2018-08-09 DIAGNOSIS — R001 Bradycardia, unspecified: Secondary | ICD-10-CM | POA: Diagnosis present

## 2018-08-09 DIAGNOSIS — E861 Hypovolemia: Secondary | ICD-10-CM

## 2018-08-09 DIAGNOSIS — F4323 Adjustment disorder with mixed anxiety and depressed mood: Secondary | ICD-10-CM

## 2018-08-09 DIAGNOSIS — I959 Hypotension, unspecified: Secondary | ICD-10-CM | POA: Diagnosis present

## 2018-08-09 DIAGNOSIS — K8500 Idiopathic acute pancreatitis without necrosis or infection: Principal | ICD-10-CM

## 2018-08-09 LAB — CBC WITH DIFFERENTIAL/PLATELET
Abs Immature Granulocytes: 0.04 10*3/uL (ref 0.00–0.07)
BASOS ABS: 0 10*3/uL (ref 0.0–0.1)
Basophils Relative: 0 %
Eosinophils Absolute: 0.1 10*3/uL (ref 0.0–0.5)
Eosinophils Relative: 1 %
HCT: 44 % (ref 36.0–46.0)
Hemoglobin: 13.5 g/dL (ref 12.0–15.0)
Immature Granulocytes: 0 %
Lymphocytes Relative: 18 %
Lymphs Abs: 1.7 10*3/uL (ref 0.7–4.0)
MCH: 29.1 pg (ref 26.0–34.0)
MCHC: 30.7 g/dL (ref 30.0–36.0)
MCV: 94.8 fL (ref 80.0–100.0)
Monocytes Absolute: 0.6 10*3/uL (ref 0.1–1.0)
Monocytes Relative: 7 %
NRBC: 0 % (ref 0.0–0.2)
Neutro Abs: 7 10*3/uL (ref 1.7–7.7)
Neutrophils Relative %: 74 %
PLATELETS: 197 10*3/uL (ref 150–400)
RBC: 4.64 MIL/uL (ref 3.87–5.11)
RDW: 13.5 % (ref 11.5–15.5)
WBC: 9.5 10*3/uL (ref 4.0–10.5)

## 2018-08-09 LAB — COMPREHENSIVE METABOLIC PANEL
ALT: 18 U/L (ref 0–44)
AST: 21 U/L (ref 15–41)
Albumin: 3.5 g/dL (ref 3.5–5.0)
Alkaline Phosphatase: 62 U/L (ref 38–126)
Anion gap: 5 (ref 5–15)
BILIRUBIN TOTAL: 0.9 mg/dL (ref 0.3–1.2)
BUN: 12 mg/dL (ref 8–23)
CO2: 22 mmol/L (ref 22–32)
Calcium: 8 mg/dL — ABNORMAL LOW (ref 8.9–10.3)
Chloride: 114 mmol/L — ABNORMAL HIGH (ref 98–111)
Creatinine, Ser: 0.95 mg/dL (ref 0.44–1.00)
GFR calc non Af Amer: >60 mL/min (ref >60)
Glucose, Bld: 102 mg/dL — ABNORMAL HIGH (ref 70–99)
Potassium: 4.2 mmol/L (ref 3.5–5.1)
Sodium: 141 mmol/L (ref 135–145)
Total Protein: 5.5 g/dL — ABNORMAL LOW (ref 6.5–8.1)

## 2018-08-09 LAB — MRSA PCR SCREENING: MRSA BY PCR: NEGATIVE

## 2018-08-09 LAB — LIPASE, BLOOD: Lipase: 1926 U/L — ABNORMAL HIGH (ref 11–51)

## 2018-08-09 LAB — LACTATE DEHYDROGENASE: LDH: 136 U/L (ref 98–192)

## 2018-08-09 LAB — CBG MONITORING, ED: Glucose-Capillary: 64 mg/dL — ABNORMAL LOW (ref 70–99)

## 2018-08-09 MED ORDER — ACETAMINOPHEN 325 MG PO TABS
650.0000 mg | ORAL_TABLET | Freq: Four times a day (QID) | ORAL | Status: DC | PRN
  Administered 2018-08-10 – 2018-08-11 (×4): 650 mg via ORAL
  Filled 2018-08-09 (×4): qty 2

## 2018-08-09 MED ORDER — ENOXAPARIN SODIUM 40 MG/0.4ML ~~LOC~~ SOLN: 40.0000 mg | SUBCUTANEOUS | Status: DC

## 2018-08-09 MED ORDER — HYDROMORPHONE HCL 1 MG/ML IJ SOLN
0.5000 mg | INTRAMUSCULAR | Status: DC | PRN
  Administered 2018-08-09 (×3): 1 mg via INTRAVENOUS
  Filled 2018-08-09 (×3): qty 1

## 2018-08-09 MED ORDER — ACETAMINOPHEN 650 MG RE SUPP: 650.0000 mg | Freq: Four times a day (QID) | RECTAL | Status: DC | PRN

## 2018-08-09 MED ORDER — HYDROXYZINE HCL 25 MG PO TABS
25.0000 mg | ORAL_TABLET | Freq: Three times a day (TID) | ORAL | Status: DC | PRN
  Administered 2018-08-09: 25 mg via ORAL
  Filled 2018-08-09: qty 1

## 2018-08-09 MED ORDER — DEXTROSE 50 % IV SOLN
1.0000 | Freq: Once | INTRAVENOUS | Status: AC
  Administered 2018-08-09: 50 mL via INTRAVENOUS
  Filled 2018-08-09: qty 50

## 2018-08-09 MED ORDER — DEXTROSE-NACL 5-0.9 % IV SOLN
INTRAVENOUS | Status: DC
  Administered 2018-08-09 – 2018-08-11 (×4): via INTRAVENOUS

## 2018-08-09 MED ORDER — HYDROMORPHONE HCL 1 MG/ML IJ SOLN
0.5000 mg | INTRAMUSCULAR | Status: DC | PRN
  Administered 2018-08-09 – 2018-08-10 (×3): 1 mg via INTRAVENOUS
  Administered 2018-08-10: 0.5 mg via INTRAVENOUS
  Administered 2018-08-10 – 2018-08-12 (×5): 1 mg via INTRAVENOUS
  Filled 2018-08-09 (×9): qty 1

## 2018-08-09 MED ORDER — ENOXAPARIN SODIUM 40 MG/0.4ML ~~LOC~~ SOLN
40.0000 mg | Freq: Every day | SUBCUTANEOUS | Status: DC
  Administered 2018-08-09 – 2018-08-12 (×4): 40 mg via SUBCUTANEOUS
  Filled 2018-08-09 (×5): qty 0.4

## 2018-08-09 MED ORDER — ACETAMINOPHEN 325 MG PO TABS: 650.0000 mg | ORAL_TABLET | Freq: Four times a day (QID) | ORAL | Status: DC | PRN

## 2018-08-09 NOTE — H&P (Signed)
History and Physical    Linda Blackburn HFW:[id number] DOB: [birth year] DOA: 08/08/2018  PCP: [name], MD   Patient coming from: Home   Chief Complaint: Abdominal pain, N/V   HPI: Linda Blackburn is a 67 y.o. female with medical history significant for depression, anxiety, and recurrent pancreatitis followed at [facility] where she is enrolled in a research study and scheduled for stenting of the pancreatic duct next month, now presenting to the emergency department for evaluation of acute onset severe abdominal pain with nausea and vomiting.  Patient reports increasingly frequent and severe episodes of acute pancreatitis over the past year, has had extensive testing done in [place], including negative genetic testing.  She developed some mild abdominal pain on Thanksgiving, has not been eating much since then, but had not been experiencing severe pain or vomiting until today when she had acute onset of severe pain in the mid and upper abdomen, radiating down to the pelvis and lower legs, associated with nausea and nonbloody vomiting.  She had been out grocery shopping and asked a bystander to call EMS.  She was found to be bradycardic in the 40s to 50s with SBP of 60 and diaphoresis.  ED Course: Upon arrival to the ED, patient is found to be saturating well on room air, bradycardic in the upper 40s to low 50s, and with blood pressure as low as 56/38.  EKG features a sinus bradycardia with rate 52.  CTA chest/abdomen/pelvis was negative for dissection or acute arterial findings, but notable for recurrent acute pancreatitis with small volume pneumobilia likely secondary to prior sphincterotomy.  Chemistry panel is notable for bicarbonate of 19 and creatinine 1.16, up from an apparent baseline of roughly 0.6.  CBC is notable for leukocytosis to 16,800.  Lipase is greater than 10,000.  Lactic acid is reassuringly normal and troponin was undetectable.  Patient was given 2 L normal saline  and was started on Levophed initially.  Blood pressure improved and she has been sustaining SBP in the low 100s for more than an hour now while off of Levophed.  She will be admitted for ongoing evaluation and management of recurrent acute pancreatitis.  Review of Systems:  All other systems reviewed and apart from HPI, are negative.  Past Medical History:  Diagnosis Date  . Abnormal uterine bleeding   . Amenorrhea   . Anxiety   . Depression   . Gallstones   . Hay fever   . HTN (hypertension)   . Pancreatitis   . Urine incontinence   . UTI (urinary tract infection)    reoccuring     Past Surgical History:  Procedure Laterality Date  . ABDOMINAL HYSTERECTOMY  1984   fibroids  . APPENDECTOMY  1984  . CHOLECYSTECTOMY  09/08/1999  . ERCP W/ SPHINCTEROTOMY AND BALLOON DILATION  08/2008  . OOPHORECTOMY     Only 1 was taken  . TONSILLECTOMY    . TUBAL LIGATION       reports that she has never smoked. She has never used smokeless tobacco. She reports that she drinks alcohol. She reports that she does not use drugs.  Allergies  Allergen Reactions  . Other Hives and Swelling    AQUASONIC [place] GEL  . Buprenorphine Hcl     "In a scarry movie" weird thoughts  . Codeine     GI upset  . Morphine And Related     "In a scarry movie" weird thoughts    Family History  Problem Relation  Age of Onset  . Alcohol abuse Mother   . Prostate cancer Father   . Stroke Maternal Aunt   . Hiatal hernia Maternal Aunt   . Prostate cancer Paternal Grandfather   . Breast cancer Neg Hx      Prior to Admission medications   Medication Sig Start Date End Date Taking? Authorizing Provider  cetirizine (ZYRTEC) 10 MG tablet Take 10 mg by mouth daily as needed for allergies.    Yes [provider]  famotidine (PEPCID) 20 MG tablet Take 20 mg by mouth daily.    Yes [provider]  losartan (COZAAR) 100 MG tablet Take 1 tablet (100 mg total) by mouth daily. 08/10/17  Yes Burchette,  Alinda Sierras, MD  sertraline (ZOLOFT) 100 MG tablet Take 1 tablet (100 mg total) by mouth daily. 08/10/17  Yes Burchette, Alinda Sierras, MD  fluconazole (DIFLUCAN) 100 MG tablet Take 1 tablet (100 mg total) by mouth daily. Patient not taking: Reported on 08/31/2018 03/19/18   [name], MD  nystatin ointment (MYCOSTATIN) Apply 1 application topically 2 (two) times daily. Patient not taking: Reported on 08-31-2018 03/19/18   [name], MD  ondansetron (ZOFRAN-ODT) 4 MG disintegrating tablet Take 1 tablet (4 mg total) by mouth every 8 (eight) hours as needed. Patient not taking: Reported on 2018/08/31 01/16/18   [name], MD  zolpidem (AMBIEN) 5 MG tablet Take 1 tablet (5 mg total) by mouth at bedtime as needed for sleep. Patient not taking: Reported on 08/31/2018 08/07/16   [name], MD    Physical Exam: Vitals:   08-31-18 2300 Aug 31, 2018 2330 08/09/18 0030 08/09/18 0130  BP: (!) 106/55 (!) 115/56 (!) 102/53 (!) 111/59  Pulse: 60 (!) 58 66 (!) 54  Resp: 16 13 15 16   SpO2: 97% 100% 92% 99%    Constitutional: NAD, appears uncomfortable Eyes: PERTLA, lids and conjunctivae normal ENMT: Mucous membranes are moist. Posterior pharynx clear of any exudate or lesions.   Neck: normal, supple, no masses, no thyromegaly Respiratory: clear to auscultation bilaterally, no wheezing, no crackles. Normal respiratory effort.   Cardiovascular: S1 & S2 heard, regular rate and rhythm. No extremity edema.  Abdomen: No distension, voluntary guarding, diffuse tenderness. Bowel sounds active.  Musculoskeletal: no clubbing / cyanosis. No joint deformity upper and lower extremities.    Skin: no significant rashes, lesions, ulcers. Warm, dry, well-perfused. Neurologic: No facial asymmetry. Sensation intact. Moving all extremities.  Psychiatric: Alert and oriented x 3. Pleasant, cooperative.    Labs on Admission: I have personally reviewed following labs and imaging studies  CBC: Recent Labs    Lab 31-Aug-2018 1542  WBC 16.8*  HGB 15.0  HCT 46.9*  MCV 90.5  PLT 810   Basic Metabolic Panel: Recent Labs  Lab 08-31-18 1542  NA 140  K 3.6  CL 110  CO2 19*  GLUCOSE 113*  BUN 14  CREATININE 1.16*  CALCIUM 9.4   GFR: CrCl cannot be calculated (Unknown ideal weight.). Liver Function Tests: Recent Labs  Lab 08/31/18 1542  AST 27  ALT 25  ALKPHOS 75  BILITOT 0.9  PROT 6.8  ALBUMIN 4.1   Recent Labs  Lab 08/31/18 1542  LIPASE >10,000*   No results for input(s): AMMONIA in the last 168 hours. Coagulation Profile: No results for input(s): INR, PROTIME in the last 168 hours. Cardiac Enzymes: Recent Labs  Lab 31-Aug-2018 1542  TROPONINI <0.03   BNP (last 3 results) No results for input(s): PROBNP in  the last 8760 hours. HbA1C: No results for input(s): HGBA1C in the last 72 hours. CBG: No results for input(s): GLUCAP in the last 168 hours. Lipid Profile: No results for input(s): CHOL, HDL, LDLCALC, TRIG, CHOLHDL, LDLDIRECT in the last 72 hours. Thyroid Function Tests: No results for input(s): TSH, T4TOTAL, FREET4, T3FREE, THYROIDAB in the last 72 hours. Anemia Panel: No results for input(s): VITAMINB12, FOLATE, FERRITIN, TIBC, IRON, RETICCTPCT in the last 72 hours. Urine analysis:    Component Value Date/Time   COLORURINE YELLOW 03/08/2018 0217   APPEARANCEUR CLEAR 03/08/2018 0217   LABSPEC 1.019 03/08/2018 0217   PHURINE 5.0 03/08/2018 0217   GLUCOSEU NEGATIVE 03/08/2018 0217   HGBUR NEGATIVE 03/08/2018 0217   BILIRUBINUR NEGATIVE 03/08/2018 0217   BILIRUBINUR neg 09/14/2016 1158   KETONESUR NEGATIVE 03/08/2018 0217   PROTEINUR NEGATIVE 03/08/2018 0217   UROBILINOGEN negative 09/14/2016 1158   UROBILINOGEN 0.2 06/07/2014 0005   NITRITE NEGATIVE 03/08/2018 0217   LEUKOCYTESUR NEGATIVE 03/08/2018 0217   Sepsis Labs: @LABRCNTIP (procalcitonin:4,lacticidven:4) )No results found for this or any previous visit (from the past 240 hour(s)).    Radiological Exams on Admission: Ct Angio Chest/abd/pel For Dissection W And/or Wo Contrast  Result Date: 08/08/2018 CLINICAL DATA:  67 year old female with tearing pain in the mid abdomen, hypotension. Evaluate for aortic dissection/aneurysm rupture. Patient has a known history of prior pancreatitis. EXAM: CT ANGIOGRAPHY CHEST, ABDOMEN AND PELVIS TECHNIQUE: Multidetector CT imaging through the chest, abdomen and pelvis was performed using the standard protocol during bolus administration of intravenous contrast. Multiplanar reconstructed images and MIPs were obtained and reviewed to evaluate the vascular anatomy. CONTRAST:  117mL ISOVUE-370 IOPAMIDOL (ISOVUE-370) INJECTION 76% COMPARISON:  Prior CT scan of the abdomen and pelvis 01/01/2018 FINDINGS: CTA CHEST FINDINGS Cardiovascular: Initial nonenhanced CT imaging demonstrates no evidence of acute intramural hematoma. Following administration of intravenous contrast, the aorta is well opacified. There is no evidence of aneurysm, dissection or other acute abnormality. Two vessel aortic arch anatomy. The right brachiocephalic and left common carotid artery share a common origin. The main pulmonary artery is enlarged with a maximal diameter of 3 cm. Mild cardiomegaly. No pericardial effusion. Mediastinum/Nodes: Unremarkable CT appearance of the thyroid gland. No suspicious mediastinal or hilar adenopathy. No soft tissue mediastinal mass. Small hiatal hernia. Lungs/Pleura: The lungs are essentially clear save for some mild dependent atelectasis in a few scattered calcified granulomas. No edema, pneumothorax or pleural effusion. Musculoskeletal: No acute fracture or aggressive appearing lytic or blastic osseous lesion. Review of the MIP images confirms the above findings. CTA ABDOMEN AND PELVIS FINDINGS VASCULAR Aorta: Normal caliber aorta without aneurysm, dissection, vasculitis or significant stenosis. Celiac: Patent without evidence of aneurysm, dissection,  vasculitis or significant stenosis. SMA: Patent without evidence of aneurysm, dissection, vasculitis or significant stenosis. Renals: Both renal arteries are patent without evidence of aneurysm, dissection, vasculitis, fibromuscular dysplasia or significant stenosis. IMA: Patent without evidence of aneurysm, dissection, vasculitis or significant stenosis. Inflow: Patent without evidence of aneurysm, dissection, vasculitis or significant stenosis. Veins: No obvious venous abnormality within the limitations of this arterial phase study. Review of the MIP images confirms the above findings. NON-VASCULAR Hepatobiliary: Normal hepatic contour and morphology. No discrete hepatic lesion. The gallbladder is surgically absent. There is small volume pneumobilia presumably secondary to prior sphincterotomy. No intra or extrahepatic biliary ductal dilatation. Pancreas: Diffusely abnormal pancreas which is edematous and poorly defined. The peripancreatic stranding is present about the peripancreatic fat and extending into the root of the mesentery. No focal fluid collection  or evidence of devascularization. Spleen: Normal in size without focal abnormality. Adrenals/Urinary Tract: Adrenal glands are unremarkable. Kidneys are normal, without renal calculi, focal lesion, or hydronephrosis. Bladder is unremarkable. Stomach/Bowel: No evidence of obstruction or focal bowel wall thickening. The appendix is surgically absent. The terminal ileum is unremarkable. Lymphatic: No suspicious lymphadenopathy. Reproductive: Surgical changes of prior hysterectomy. No adnexal masses. Other: No abdominal wall hernia or abnormality. No abdominopelvic ascites. Musculoskeletal: No acute or significant osseous findings. Review of the MIP images confirms the above findings. IMPRESSION: CTA CHEST 1. No evidence of aortic dissection or other acute arterial abnormality. 2. Mildly enlarged main pulmonary artery as can be seen in the setting of chronic  pulmonary arterial hypertension. 3. Mild cardiomegaly. 4. Small hiatal hernia. CTA ABD/PELVIS 1. No evidence of acute aortic dissection, active hemorrhage or other acute vascular abnormality. 2. Diffusely abnormal pancreatitis which is edematous, poorly defined and demonstrates extensive peripancreatic stranding. Findings are concerning for recurrent acute pancreatitis. 3. Small volume pneumobilia presumably related to prior sphincterotomy and patency of the ampulla of Vater. 4. Small fat containing umbilical hernia. Electronically Signed   By: Jacqulynn Cadet M.D.   On: 08/08/2018 16:21    EKG: Independently reviewed. Sinus bradycardia (rate 52).   Assessment/Plan   1. Recurrent acute pancreatitis  - Presents with acute severe abdominal pain with nausea and vomiting  - She has hx of recurrent acute pancreatitis followed at [facility] where she is enrolled in research study and scheduled for stenting of pancreatitic duct in January 2020  - Lipase is >10k, CT findings consistent with acute pancreatitis, no defined fluid collection - She was fluid-resuscitated in ED with 2 liters NS and treated with analgesics and antiemetics  - Continue bowel-rest, IVF, pain-control, check LDH   2. Hypotension; history of HTN  - Hypotensive in ED with SBP 50's-60's   - She was on Levophed initially, bolused with 2 liters NS and now off of pressor for >1 hr with SBP sustaining low 100's  - She had similar episode with prior episodes of pancreatitis, does not appear infected  - Continue IVF hydration, monitor is SDU initially    3. Sinus bradycardia  - HR in 40's-50's initially, sinus rhythm on EKG  - Seems to have resolved, likely vagal response in setting of N/V, continue cardiac monitoring for now    4. Acute kidney injury  - SCr is 1.16 on admission, up from apparent baseline of ~0.6  - No hydronephrosis on CT   - Likely a prerenal azotemia in setting of hypotension and N/V  -  Fluid-resuscitated in ED with 2 liters NS  - Avoid nephrotoxins, renally-dose medications, repeat chemistries in am    5. Depression, anxiety   - Zoloft on hold while NPO     DVT prophylaxis: Lovenox  Code Status: Full  Family Communication: Discussed with patient  Consults called: None Admission status: Inpatient     Vianne Bulls, MD [facility] Pager [phone]  If 7PM-7AM, please contact night-coverage [email or web address] Password [email or web address]  08/09/2018, 2:04 AM

## 2018-08-09 NOTE — Progress Notes (Addendum)
[place] TEAM 1 - Stepdown/ICU TEAM  Linda Blackburn  BMW:[id number] DOB: 03-22-1951 DOA: 08/08/2018 PCP: [name], MD    Brief Narrative:  67 y.o. female with a hx of depression, anxiety, and recurrent pancreatitis followed at [facility] where she is enrolled in a research study and scheduled for stenting of the pancreatic duct next month., She presented to the ED w/ acute onset severe abdominal pain with nausea and vomiting. EMS found her to be bradycardic in the 40s to 50s with SBP of 60 and diaphoretic.  In the ED EKG featured a sinus bradycardia with rate 52. CTA chest/abdomen/pelvis was negative for dissection or acute arterial findings, but notable for recurrent acute pancreatitis with small volume pneumobilia likely secondary to prior sphincterotomy. CBC was notable for leukocytosis to 16,800.  Lipase was greater than 10,000.   Significant Events: 12/6 admit   Subjective: Pt is seen for a f/u visit.   Pt confirms to me that she wishes to be NCB/DNR. She displayed full understanding of what this means.   Assessment & Plan:  Recurrent acute idiopathic pancreatitis  - hx of recurrent acute pancreatitis followed at [facility] where she is enrolled in research study and scheduled for stenting of pancreatitic duct in January 2020  - Lipase is >10k, CT findings consistent with acute pancreatitis, no defined fluid collection  Hypotension / history of HTN  - on Levophed initially  Sinus bradycardia  - HR in 40's-50's initially, sinus rhythm on EKG   Acute kidney injury  - SCr is 1.16 on admission - baseline of ~0.6   Depression, anxiety   - Zoloft   DVT prophylaxis: lovenox  Code Status: DNR - NO CODE Family Communication:  Disposition Plan:   Consultants:  none  Antimicrobials:  none   Objective: Blood pressure (!) 113/56, pulse 70, temperature 98.7 F (37.1 C), temperature source Oral, resp. rate 19, SpO2 97 %.  Intake/Output  Summary (Last 24 hours) at 08/09/2018 1419 Last data filed at 08/09/2018 1011 Gross per 24 hour  Intake 3045.18 ml  Output -  Net 3045.18 ml   There were no vitals filed for this visit.  Examination: Pt was seen for a f/u visit.    CBC: Recent Labs  Lab 08/08/18 1542 08/09/18 0429  WBC 16.8* 9.5  NEUTROABS  --  7.0  HGB 15.0 13.5  HCT 46.9* 44.0  MCV 90.5 94.8  PLT 253 272   Basic Metabolic Panel: Recent Labs  Lab 08/08/18 1542 08/09/18 0429  NA 140 141  K 3.6 4.2  CL 110 114*  CO2 19* 22  GLUCOSE 113* 102*  BUN 14 12  CREATININE 1.16* 0.95  CALCIUM 9.4 8.0*   GFR: CrCl cannot be calculated (Unknown ideal weight.).  Liver Function Tests: Recent Labs  Lab 08/08/18 1542 08/09/18 0429  AST 27 21  ALT 25 18  ALKPHOS 75 62  BILITOT 0.9 0.9  PROT 6.8 5.5*  ALBUMIN 4.1 3.5   Recent Labs  Lab 08/08/18 1542 08/09/18 0429  LIPASE >10,000* 1,926*    Cardiac Enzymes: Recent Labs  Lab 08/08/18 1542  TROPONINI <0.03   CBG: Recent Labs  Lab 08/09/18 0930  GLUCAP 64*    Recent Results (from the past 240 hour(s))  MRSA PCR Screening     Status: None   Collection Time: 08/09/18 12:10 PM  Result Value Ref Range Status   MRSA by PCR NEGATIVE NEGATIVE Final    Comment:  The GeneXpert MRSA Assay (FDA approved for NASAL specimens only), is one component of a comprehensive MRSA colonization surveillance program. It is not intended to diagnose MRSA infection nor to guide or monitor treatment for MRSA infections. Performed at [facility], [place] [address] [place], [place] [address]      Scheduled Meds: . enoxaparin (LOVENOX) injection  40 mg Subcutaneous Daily   Continuous Infusions: . dextrose 5 % and 0.9% NaCl 75 mL/hr at 08/09/18 1014  . famotidine (PEPCID) IV Stopped (08/09/18 1011)     LOS: 1 day   Time spent: No Charge  Cherene Altes, MD [facility] Office  [phone] Pager - Text Page per Amion as per  below:  On-Call/Text Page:      [email or web address]  If 7PM-7AM, please contact night-coverage [email or web address] 08/09/2018, 2:19 PM

## 2018-08-10 DIAGNOSIS — K859 Acute pancreatitis without necrosis or infection, unspecified: Secondary | ICD-10-CM

## 2018-08-10 DIAGNOSIS — I959 Hypotension, unspecified: Secondary | ICD-10-CM

## 2018-08-10 DIAGNOSIS — N179 Acute kidney failure, unspecified: Secondary | ICD-10-CM

## 2018-08-10 DIAGNOSIS — I10 Essential (primary) hypertension: Secondary | ICD-10-CM

## 2018-08-10 LAB — CBC
HCT: 40.1 % (ref 36.0–46.0)
Hemoglobin: 12.5 g/dL (ref 12.0–15.0)
MCH: 29.4 pg (ref 26.0–34.0)
MCHC: 31.2 g/dL (ref 30.0–36.0)
MCV: 94.4 fL (ref 80.0–100.0)
Platelets: 178 10*3/uL (ref 150–400)
RBC: 4.25 MIL/uL (ref 3.87–5.11)
RDW: 13.8 % (ref 11.5–15.5)
WBC: 8.9 10*3/uL (ref 4.0–10.5)
nRBC: 0 % (ref 0.0–0.2)

## 2018-08-10 LAB — COMPREHENSIVE METABOLIC PANEL
ALT: 17 U/L (ref 0–44)
AST: 19 U/L (ref 15–41)
Albumin: 3.2 g/dL — ABNORMAL LOW (ref 3.5–5.0)
Alkaline Phosphatase: 58 U/L (ref 38–126)
Anion gap: 6 (ref 5–15)
BUN: 7 mg/dL — AB (ref 8–23)
CHLORIDE: 111 mmol/L (ref 98–111)
CO2: 24 mmol/L (ref 22–32)
Calcium: 8.2 mg/dL — ABNORMAL LOW (ref 8.9–10.3)
Creatinine, Ser: 0.78 mg/dL (ref 0.44–1.00)
GFR calc Af Amer: >60 mL/min (ref >60)
GFR calc non Af Amer: >60 mL/min (ref >60)
Glucose, Bld: 101 mg/dL — ABNORMAL HIGH (ref 70–99)
POTASSIUM: 3.8 mmol/L (ref 3.5–5.1)
Sodium: 141 mmol/L (ref 135–145)
Total Bilirubin: 1.2 mg/dL (ref 0.3–1.2)
Total Protein: 5.8 g/dL — ABNORMAL LOW (ref 6.5–8.1)

## 2018-08-10 LAB — GLUCOSE, CAPILLARY
Glucose-Capillary: 70 mg/dL (ref 70–99)
Glucose-Capillary: 72 mg/dL (ref 70–99)
Glucose-Capillary: 81 mg/dL (ref 70–99)
Glucose-Capillary: 88 mg/dL (ref 70–99)

## 2018-08-10 LAB — LIPASE, BLOOD: Lipase: 261 U/L — ABNORMAL HIGH (ref 11–51)

## 2018-08-10 NOTE — Progress Notes (Signed)
PROGRESS NOTE        PATIENT DETAILS Name: Linda Blackburn Age: 67 y.o. Sex: female Date of Birth: [birth year] Admit Date: 08/08/2018 Admitting Physician Vianne Bulls, MD [id number], Alinda Sierras, MD  Brief Narrative: Patient is a 67 y.o. female with history of idiopathic recurrent pancreatitis (extensive work-up at [facility])-s/p remote cholecystectomy-apparently does not have pancreatic divisum as previously believed-now follwed at [facility]) presented with acute pancreatitis.  See below for further details  Subjective: Improved than on presentation-continues to have abdominal pain-.  Claims that liquid intake occasionally worsen her pain.  No nausea vomiting.  Assessment/Plan: Acute idiopathic recurrent pancreatitis: Overall improved but still with  abdominal pain-change to n.p.o. status-okay for sips/chips.  Increase IV fluids to 125 cc an hour.  Continue as needed narcotics, antiemetics and other supportive care.  Claims she is followed at [facility] is scheduled for pancreatic stenting in Jan 2020.  Transient hypotension with bradycardia: Occurred in the ED- resolved within a few hours-etiology remains unknown.  Apparently required vasopressors transiently.  AKI: Likely hemodynamically mediated-resolved.  Hypertension: Controlled-continue to hold losartan at this time.  GERD: Continue Pepcid  Depressions with anxiety: Stable-continue Zoloft  DVT Prophylaxis: Prophylactic Lovenox   Code Status: Full code   Family Communication: None at bedside  Disposition Plan: Remain inpatient-requires several days of hospitalization before discharge.  Antimicrobial agents: Anti-infectives (From admission, onward)   None      Procedures: None  CONSULTS:  None  Time spent: 25- minutes-Greater than 50% of this time was spent in counseling, explanation of diagnosis, planning of further management, and coordination of  care.  MEDICATIONS: Scheduled Meds: . enoxaparin (LOVENOX) injection  40 mg Subcutaneous Daily   Continuous Infusions: . dextrose 5 % and 0.9% NaCl 125 mL/hr at 08/10/18 0857  . famotidine (PEPCID) IV 20 mg (08/10/18 0902)   PRN Meds:.acetaminophen, HYDROmorphone (DILAUDID) injection, hydrOXYzine, ondansetron (ZOFRAN) IV   PHYSICAL EXAM: Vital signs: Vitals:   08/09/18 1343 08/09/18 1606 08/09/18 2104 08/10/18 0516  BP: (!) 109/58 (!) 102/57 (!) 117/52 (!) 124/54  Pulse: 82 71 81 72  Resp: 17 15 18 16   Temp:  98.8 F (37.1 C) 99.1 F (37.3 C) 98.6 F (37 C)  TempSrc:  Oral Oral Oral  SpO2: (!) 89% 92% 93% 94%   There were no vitals filed for this visit. There is no height or weight on file to calculate BMI.   General appearance :Awake, alert, not in any distress. Speech Clear. Not toxic Looking Eyes:, pupils equally reactive to light and accomodation,no scleral icterus.Pink conjunctiva HEENT: Atraumatic and Normocephalic Neck: supple, no JVD. No cervical lymphadenopathy. No thyromegaly Resp:Good air entry bilaterally, no added sounds  CVS: S1 S2 regular, no murmurs.  GI: Bowel sounds present, moderately tender in the epigastric area but abdomen is soft.   Extremities: B/L Lower Ext shows no edema, both legs are warm to touch Neurology:  speech clear,Non focal, sensation is grossly intact. Psychiatric: Normal judgment and insight. Alert and oriented x 3. Normal mood. Musculoskeletal:No digital cyanosis Skin:No Rash, warm and dry Wounds:N/A  I have personally reviewed following labs and imaging studies  LABORATORY DATA: CBC: Recent Labs  Lab 08/08/18 1542 08/09/18 0429 08/10/18 0503  WBC 16.8* 9.5 8.9  NEUTROABS  --  7.0  --   HGB 15.0 13.5 12.5  HCT 46.9* 44.0 40.1  MCV 90.5 94.8 94.4  PLT 253 197 338    Basic Metabolic Panel: Recent Labs  Lab 08/08/18 1542 08/09/18 0429 08/10/18 0503  NA 140 141 141  K 3.6 4.2 3.8  CL 110 114* 111  CO2 19* 22 24   GLUCOSE 113* 102* 101*  BUN 14 12 7*  CREATININE 1.16* 0.95 0.78  CALCIUM 9.4 8.0* 8.2*    GFR: CrCl cannot be calculated (Unknown ideal weight.).  Liver Function Tests: Recent Labs  Lab 08/08/18 1542 08/09/18 0429 08/10/18 0503  AST 27 21 19   ALT 25 18 17   ALKPHOS 75 62 58  BILITOT 0.9 0.9 1.2  PROT 6.8 5.5* 5.8*  ALBUMIN 4.1 3.5 3.2*   Recent Labs  Lab 08/08/18 1542 08/09/18 0429 08/10/18 0503  LIPASE >10,000* 1,926* 261*   No results for input(s): AMMONIA in the last 168 hours.  Coagulation Profile: No results for input(s): INR, PROTIME in the last 168 hours.  Cardiac Enzymes: Recent Labs  Lab 08/08/18 1542  TROPONINI <0.03    BNP (last 3 results) No results for input(s): PROBNP in the last 8760 hours.  HbA1C: No results for input(s): HGBA1C in the last 72 hours.  CBG: Recent Labs  Lab 08/09/18 0930 08/10/18 0017 08/10/18 0514  GLUCAP 64* 70 72    Lipid Profile: No results for input(s): CHOL, HDL, LDLCALC, TRIG, CHOLHDL, LDLDIRECT in the last 72 hours.  Thyroid Function Tests: No results for input(s): TSH, T4TOTAL, FREET4, T3FREE, THYROIDAB in the last 72 hours.  Anemia Panel: No results for input(s): VITAMINB12, FOLATE, FERRITIN, TIBC, IRON, RETICCTPCT in the last 72 hours.  Urine analysis:    Component Value Date/Time   COLORURINE YELLOW 03/08/2018 0217   APPEARANCEUR CLEAR 03/08/2018 0217   LABSPEC 1.019 03/08/2018 0217   PHURINE 5.0 03/08/2018 0217   GLUCOSEU NEGATIVE 03/08/2018 0217   HGBUR NEGATIVE 03/08/2018 0217   BILIRUBINUR NEGATIVE 03/08/2018 0217   BILIRUBINUR neg 09/14/2016 1158   KETONESUR NEGATIVE 03/08/2018 0217   PROTEINUR NEGATIVE 03/08/2018 0217   UROBILINOGEN negative 09/14/2016 1158   UROBILINOGEN 0.2 06/07/2014 0005   NITRITE NEGATIVE 03/08/2018 0217   LEUKOCYTESUR NEGATIVE 03/08/2018 0217    Sepsis Labs: Lactic Acid, Venous    Component Value Date/Time   LATICACIDVEN 1.46 08/08/2018 1634     MICROBIOLOGY: Recent Results (from the past 240 hour(s))  MRSA PCR Screening     Status: None   Collection Time: 08/09/18 12:10 PM  Result Value Ref Range Status   MRSA by PCR NEGATIVE NEGATIVE Final    Comment:        The GeneXpert MRSA Assay (FDA approved for NASAL specimens only), is one component of a comprehensive MRSA colonization surveillance program. It is not intended to diagnose MRSA infection nor to guide or monitor treatment for MRSA infections. Performed at [facility], [place] [address] [place], [place] [address]     RADIOLOGY STUDIES/RESULTS: Ct Angio Chest/abd/pel For Dissection W And/or Wo Contrast  Result Date: 08/08/2018 CLINICAL DATA:  67 year old female with tearing pain in the mid abdomen, hypotension. Evaluate for aortic dissection/aneurysm rupture. Patient has a known history of prior pancreatitis. EXAM: CT ANGIOGRAPHY CHEST, ABDOMEN AND PELVIS TECHNIQUE: Multidetector CT imaging through the chest, abdomen and pelvis was performed using the standard protocol during bolus administration of intravenous contrast. Multiplanar reconstructed images and MIPs were obtained and reviewed to evaluate the vascular anatomy. CONTRAST:  158mL ISOVUE-370 IOPAMIDOL (ISOVUE-370) INJECTION 76% COMPARISON:  Prior CT scan of the abdomen and  pelvis 01/01/2018 FINDINGS: CTA CHEST FINDINGS Cardiovascular: Initial nonenhanced CT imaging demonstrates no evidence of acute intramural hematoma. Following administration of intravenous contrast, the aorta is well opacified. There is no evidence of aneurysm, dissection or other acute abnormality. Two vessel aortic arch anatomy. The right brachiocephalic and left common carotid artery share a common origin. The main pulmonary artery is enlarged with a maximal diameter of 3 cm. Mild cardiomegaly. No pericardial effusion. Mediastinum/Nodes: Unremarkable CT appearance of the thyroid gland. No suspicious mediastinal or hilar adenopathy. No soft  tissue mediastinal mass. Small hiatal hernia. Lungs/Pleura: The lungs are essentially clear save for some mild dependent atelectasis in a few scattered calcified granulomas. No edema, pneumothorax or pleural effusion. Musculoskeletal: No acute fracture or aggressive appearing lytic or blastic osseous lesion. Review of the MIP images confirms the above findings. CTA ABDOMEN AND PELVIS FINDINGS VASCULAR Aorta: Normal caliber aorta without aneurysm, dissection, vasculitis or significant stenosis. Celiac: Patent without evidence of aneurysm, dissection, vasculitis or significant stenosis. SMA: Patent without evidence of aneurysm, dissection, vasculitis or significant stenosis. Renals: Both renal arteries are patent without evidence of aneurysm, dissection, vasculitis, fibromuscular dysplasia or significant stenosis. IMA: Patent without evidence of aneurysm, dissection, vasculitis or significant stenosis. Inflow: Patent without evidence of aneurysm, dissection, vasculitis or significant stenosis. Veins: No obvious venous abnormality within the limitations of this arterial phase study. Review of the MIP images confirms the above findings. NON-VASCULAR Hepatobiliary: Normal hepatic contour and morphology. No discrete hepatic lesion. The gallbladder is surgically absent. There is small volume pneumobilia presumably secondary to prior sphincterotomy. No intra or extrahepatic biliary ductal dilatation. Pancreas: Diffusely abnormal pancreas which is edematous and poorly defined. The peripancreatic stranding is present about the peripancreatic fat and extending into the root of the mesentery. No focal fluid collection or evidence of devascularization. Spleen: Normal in size without focal abnormality. Adrenals/Urinary Tract: Adrenal glands are unremarkable. Kidneys are normal, without renal calculi, focal lesion, or hydronephrosis. Bladder is unremarkable. Stomach/Bowel: No evidence of obstruction or focal bowel wall thickening.  The appendix is surgically absent. The terminal ileum is unremarkable. Lymphatic: No suspicious lymphadenopathy. Reproductive: Surgical changes of prior hysterectomy. No adnexal masses. Other: No abdominal wall hernia or abnormality. No abdominopelvic ascites. Musculoskeletal: No acute or significant osseous findings. Review of the MIP images confirms the above findings. IMPRESSION: CTA CHEST 1. No evidence of aortic dissection or other acute arterial abnormality. 2. Mildly enlarged main pulmonary artery as can be seen in the setting of chronic pulmonary arterial hypertension. 3. Mild cardiomegaly. 4. Small hiatal hernia. CTA ABD/PELVIS 1. No evidence of acute aortic dissection, active hemorrhage or other acute vascular abnormality. 2. Diffusely abnormal pancreatitis which is edematous, poorly defined and demonstrates extensive peripancreatic stranding. Findings are concerning for recurrent acute pancreatitis. 3. Small volume pneumobilia presumably related to prior sphincterotomy and patency of the ampulla of Vater. 4. Small fat containing umbilical hernia. Electronically Signed   By: Jacqulynn Cadet M.D.   On: 08/08/2018 16:21     LOS: 2 days   Oren Binet, MD  [facility]  If 7PM-7AM, please contact night-coverage  Please page via [email or web address] [email or web address]-click on MD name and type text message  08/10/2018, 10:18 AM

## 2018-08-10 NOTE — Plan of Care (Signed)
  Problem: Education: °Goal: Knowledge of General Education information will improve °Description: Including pain rating scale, medication(s)/side effects and non-pharmacologic comfort measures °Outcome: Not Met (add Reason) °  °Problem: Health Behavior/Discharge Planning: °Goal: Ability to manage health-related needs will improve °Outcome: Not Met (add Reason) °  °Problem: Clinical Measurements: °Goal: Ability to maintain clinical measurements within normal limits will improve °Outcome: Not Met (add Reason) °Goal: Will remain free from infection °Outcome: Not Met (add Reason) °Goal: Diagnostic test results will improve °Outcome: Not Met (add Reason) °Goal: Respiratory complications will improve °Outcome: Not Met (add Reason) °Goal: Cardiovascular complication will be avoided °Outcome: Not Met (add Reason) °  °Problem: Activity: °Goal: Risk for activity intolerance will decrease °Outcome: Not Met (add Reason) °  °Problem: Nutrition: °Goal: Adequate nutrition will be maintained °Outcome: Not Met (add Reason) °  °Problem: Coping: °Goal: Level of anxiety will decrease °Outcome: Not Met (add Reason) °  °Problem: Elimination: °Goal: Will not experience complications related to bowel motility °Outcome: Not Met (add Reason) °Goal: Will not experience complications related to urinary retention °Outcome: Not Met (add Reason) °  °Problem: Pain Managment: °Goal: General experience of comfort will improve °Outcome: Not Met (add Reason) °  °Problem: Safety: °Goal: Ability to remain free from injury will improve °Outcome: Not Met (add Reason) °  °Problem: Skin Integrity: °Goal: Risk for impaired skin integrity will decrease °Outcome: Not Met (add Reason) °  °

## 2018-08-10 NOTE — Plan of Care (Signed)
Patient had pain throughout the shift that was relieved by prn dilaudid.

## 2018-08-11 LAB — CBC
HCT: 35.4 % — ABNORMAL LOW (ref 36.0–46.0)
Hemoglobin: 11.1 g/dL — ABNORMAL LOW (ref 12.0–15.0)
MCH: 29.1 pg (ref 26.0–34.0)
MCHC: 31.4 g/dL (ref 30.0–36.0)
MCV: 92.9 fL (ref 80.0–100.0)
PLATELETS: 171 10*3/uL (ref 150–400)
RBC: 3.81 MIL/uL — ABNORMAL LOW (ref 3.87–5.11)
RDW: 13.5 % (ref 11.5–15.5)
WBC: 7.1 10*3/uL (ref 4.0–10.5)
nRBC: 0 % (ref 0.0–0.2)

## 2018-08-11 LAB — COMPREHENSIVE METABOLIC PANEL
ALT: 16 U/L (ref 0–44)
AST: 18 U/L (ref 15–41)
Albumin: 2.8 g/dL — ABNORMAL LOW (ref 3.5–5.0)
Alkaline Phosphatase: 57 U/L (ref 38–126)
Anion gap: 6 (ref 5–15)
BUN: 6 mg/dL — ABNORMAL LOW (ref 8–23)
CO2: 23 mmol/L (ref 22–32)
Calcium: 8 mg/dL — ABNORMAL LOW (ref 8.9–10.3)
Chloride: 110 mmol/L (ref 98–111)
Creatinine, Ser: 0.7 mg/dL (ref 0.44–1.00)
GFR calc non Af Amer: >60 mL/min (ref >60)
Glucose, Bld: 106 mg/dL — ABNORMAL HIGH (ref 70–99)
Potassium: 3.1 mmol/L — ABNORMAL LOW (ref 3.5–5.1)
Sodium: 139 mmol/L (ref 135–145)
Total Bilirubin: 1 mg/dL (ref 0.3–1.2)
Total Protein: 5.4 g/dL — ABNORMAL LOW (ref 6.5–8.1)

## 2018-08-11 LAB — GLUCOSE, CAPILLARY
GLUCOSE-CAPILLARY: 108 mg/dL — AB (ref 70–99)
Glucose-Capillary: 76 mg/dL (ref 70–99)

## 2018-08-11 LAB — LIPASE, BLOOD: Lipase: 54 U/L — ABNORMAL HIGH (ref 11–51)

## 2018-08-11 MED ORDER — POTASSIUM CHLORIDE CRYS ER 20 MEQ PO TBCR
40.0000 meq | EXTENDED_RELEASE_TABLET | Freq: Once | ORAL | Status: AC
  Administered 2018-08-11: 40 meq via ORAL
  Filled 2018-08-11: qty 2

## 2018-08-11 NOTE — Progress Notes (Signed)
PROGRESS NOTE        PATIENT DETAILS Name: Linda Blackburn Age: 67 y.o. Sex: female Date of Birth: [birth year] Admit Date: 08/08/2018 Admitting Physician Linda Bulls, MD [id number], Linda Sierras, MD  Brief Narrative: Patient is a 67 y.o. female with history of idiopathic recurrent pancreatitis (extensive work-up at [facility])-s/p remote cholecystectomy-apparently does not have pancreatic divisum as previously believed-now follwed at [facility]) presented with acute pancreatitis.  See below for further details  Subjective:  Patient in bed, appears comfortable, denies any headache, no fever, no chest pain or pressure, no shortness of breath , improving epigastric abdominal pain. No focal weakness.   Assessment/Plan:   Acute idiopathic recurrent pancreatitis: Pain has improved, she has had recurrent problem for several decades now, she is finally following with GI team at [facility] in [place].  With her permission records obtained as below.  She is due for ERCP for possible sphincterectomy mid January 2020.  For now continue supportive care, introduce liquid diet and advance activity.  " From [facility]  IMPRESSION: Linda Blackburn is a 67 year old female with PMH acute recurrent pancreatitis presenting for follow-up. Secretin stimulated MRCP showed conventional pancreatic duct anatomy and evidence of periampullary diverticulum. Overall, MRCP showed good exocrine function, no obvious obstruction. Discussed extensively risk/benefit of pursuing ERCP with pancreatic sphincterotomy, at this time patient wishes to hold off.  PLAN: -At this time, will hold off on ERCP with intervention but patient can let us know if she reconsiders -F/u in 1 year -Pt agrees to be a part of observational research study  Discussed with Dr Linda Blackburn    Electronically signed by Linda Blackburn, Linda Highland, MD at 06/12/2018 11:17 AM EDT"   Transient hypotension with bradycardia: Occurred in the  ED- resolved within a few hours-etiology remains unknown.  Apparently required vasopressors transiently.  Could have been due to sepsis dehydration along with vasovagal response due to pain.  Resolved will monitor clinically.  So far stable.  AKI: Due to hypotension and dehydration, resolved after IV fluids.  Hypokalemia.  Replaced will monitor.    Hypertension: Controlled-continue to hold losartan at this time.  GERD: Continue Pepcid  Depressions with anxiety: Stable-continue Zoloft    DVT Prophylaxis: Prophylactic Lovenox   Code Status: Full code   Family Communication: None at bedside  Disposition Plan: Advance Activity and diet likely discharge in the next 1 to 2 days.  Antimicrobial agents: Anti-infectives (From admission, onward)   None      Procedures: None  CONSULTS:  None  Time spent: 25- minutes-Greater than 50% of this time was spent in counseling, explanation of diagnosis, planning of further management, and coordination of care.  MEDICATIONS: Scheduled Meds: . enoxaparin (LOVENOX) injection  40 mg Subcutaneous Daily   Continuous Infusions: . dextrose 5 % and 0.9% NaCl 75 mL/hr at 08/11/18 0806  . famotidine (PEPCID) IV 20 mg (08/11/18 0825)   PRN Meds:.acetaminophen, HYDROmorphone (DILAUDID) injection, hydrOXYzine, ondansetron (ZOFRAN) IV   PHYSICAL EXAM: Vital signs: Vitals:   08/10/18 0516 08/10/18 1325 08/10/18 2120 08/11/18 0502  BP: (!) 124/54 114/60 (!) 122/59 110/61  Pulse: 72 63 75 66  Resp: 16 20 18 18   Temp: 98.6 F (37 C) 97.8 F (36.6 C) 98.6 F (37 C) 98.2 F (36.8 C)  TempSrc: Oral  Oral   SpO2: 94% 97% 94% 97%   There were  no vitals filed for this visit. There is no height or weight on file to calculate BMI.   Exam  Awake Alert, Oriented X 3, No new F.N deficits, Normal affect Linda Blackburn.AT,PERRAL Supple Neck,No JVD, No cervical lymphadenopathy appriciated.  Symmetrical Chest wall movement, Good air movement bilaterally,  CTAB RRR,No Gallops, Rubs or new Murmurs, No Parasternal Heave +ve B.Sounds, Abd Soft, +ve epigastric tenderness, No organomegaly appriciated, No rebound - guarding or rigidity. No Cyanosis, Clubbing or edema, No new Rash or bruise   I have personally reviewed following labs and imaging studies  LABORATORY DATA: CBC: Recent Labs  Lab 08/08/18 1542 08/09/18 0429 08/10/18 0503 08/11/18 0437  WBC 16.8* 9.5 8.9 7.1  NEUTROABS  --  7.0  --   --   HGB 15.0 13.5 12.5 11.1*  HCT 46.9* 44.0 40.1 35.4*  MCV 90.5 94.8 94.4 92.9  PLT 253 197 178 676    Basic Metabolic Panel: Recent Labs  Lab 08/08/18 1542 08/09/18 0429 08/10/18 0503 08/11/18 0437  NA 140 141 141 139  K 3.6 4.2 3.8 3.1*  CL 110 114* 111 110  CO2 19* 22 24 23   GLUCOSE 113* 102* 101* 106*  BUN 14 12 7* 6*  CREATININE 1.16* 0.95 0.78 0.70  CALCIUM 9.4 8.0* 8.2* 8.0*    GFR: CrCl cannot be calculated (Unknown ideal weight.).  Liver Function Tests: Recent Labs  Lab 08/08/18 1542 08/09/18 0429 08/10/18 0503 08/11/18 0437  AST 27 21 19 18   ALT 25 18 17 16   ALKPHOS 75 62 58 57  BILITOT 0.9 0.9 1.2 1.0  PROT 6.8 5.5* 5.8* 5.4*  ALBUMIN 4.1 3.5 3.2* 2.8*   Recent Labs  Lab 08/08/18 1542 08/09/18 0429 08/10/18 0503 08/11/18 0437  LIPASE >10,000* 1,926* 261* 54*   No results for input(s): AMMONIA in the last 168 hours.  Coagulation Profile: No results for input(s): INR, PROTIME in the last 168 hours.  Cardiac Enzymes: Recent Labs  Lab 08/08/18 1542  TROPONINI <0.03    BNP (last 3 results) No results for input(s): PROBNP in the last 8760 hours.  HbA1C: No results for input(s): HGBA1C in the last 72 hours.  CBG: Recent Labs  Lab 08/10/18 0514 08/10/18 1201 08/10/18 1832 08/11/18 0002 08/11/18 0707  GLUCAP 72 81 88 108* 76    Lipid Profile: No results for input(s): CHOL, HDL, LDLCALC, TRIG, CHOLHDL, LDLDIRECT in the last 72 hours.  Thyroid Function Tests: No results for input(s):  TSH, T4TOTAL, FREET4, T3FREE, THYROIDAB in the last 72 hours.  Anemia Panel: No results for input(s): VITAMINB12, FOLATE, FERRITIN, TIBC, IRON, RETICCTPCT in the last 72 hours.  Urine analysis:    Component Value Date/Time   COLORURINE YELLOW 03/08/2018 0217   APPEARANCEUR CLEAR 03/08/2018 0217   LABSPEC 1.019 03/08/2018 0217   PHURINE 5.0 03/08/2018 0217   GLUCOSEU NEGATIVE 03/08/2018 0217   HGBUR NEGATIVE 03/08/2018 0217   BILIRUBINUR NEGATIVE 03/08/2018 0217   BILIRUBINUR neg 09/14/2016 1158   KETONESUR NEGATIVE 03/08/2018 0217   PROTEINUR NEGATIVE 03/08/2018 0217   UROBILINOGEN negative 09/14/2016 1158   UROBILINOGEN 0.2 06/07/2014 0005   NITRITE NEGATIVE 03/08/2018 0217   LEUKOCYTESUR NEGATIVE 03/08/2018 0217    Sepsis Labs: Lactic Acid, Venous    Component Value Date/Time   LATICACIDVEN 1.46 08/08/2018 1634    MICROBIOLOGY: Recent Results (from the past 240 hour(s))  MRSA PCR Screening     Status: None   Collection Time: 08/09/18 12:10 PM  Result Value Ref Range Status  MRSA by PCR NEGATIVE NEGATIVE Final    Comment:        The GeneXpert MRSA Assay (FDA approved for NASAL specimens only), is one component of a comprehensive MRSA colonization surveillance program. It is not intended to diagnose MRSA infection nor to guide or monitor treatment for MRSA infections. Performed at [facility], [place] [address], [place], [place] [address]     RADIOLOGY STUDIES/RESULTS: Ct Angio Chest/abd/pel For Dissection W And/or Wo Contrast  Result Date: 08/08/2018 CLINICAL DATA:  67 year old female with tearing pain in the mid abdomen, hypotension. Evaluate for aortic dissection/aneurysm rupture. Patient has a known history of prior pancreatitis. EXAM: CT ANGIOGRAPHY CHEST, ABDOMEN AND PELVIS TECHNIQUE: Multidetector CT imaging through the chest, abdomen and pelvis was performed using the standard protocol during bolus administration of intravenous contrast. Multiplanar  reconstructed images and MIPs were obtained and reviewed to evaluate the vascular anatomy. CONTRAST:  142mL ISOVUE-370 IOPAMIDOL (ISOVUE-370) INJECTION 76% COMPARISON:  Prior CT scan of the abdomen and pelvis 01/01/2018 FINDINGS: CTA CHEST FINDINGS Cardiovascular: Initial nonenhanced CT imaging demonstrates no evidence of acute intramural hematoma. Following administration of intravenous contrast, the aorta is well opacified. There is no evidence of aneurysm, dissection or other acute abnormality. Two vessel aortic arch anatomy. The right brachiocephalic and left common carotid artery share a common origin. The main pulmonary artery is enlarged with a maximal diameter of 3 cm. Mild cardiomegaly. No pericardial effusion. Mediastinum/Nodes: Unremarkable CT appearance of the thyroid gland. No suspicious mediastinal or hilar adenopathy. No soft tissue mediastinal mass. Small hiatal hernia. Lungs/Pleura: The lungs are essentially clear save for some mild dependent atelectasis in a few scattered calcified granulomas. No edema, pneumothorax or pleural effusion. Musculoskeletal: No acute fracture or aggressive appearing lytic or blastic osseous lesion. Review of the MIP images confirms the above findings. CTA ABDOMEN AND PELVIS FINDINGS VASCULAR Aorta: Normal caliber aorta without aneurysm, dissection, vasculitis or significant stenosis. Celiac: Patent without evidence of aneurysm, dissection, vasculitis or significant stenosis. SMA: Patent without evidence of aneurysm, dissection, vasculitis or significant stenosis. Renals: Both renal arteries are patent without evidence of aneurysm, dissection, vasculitis, fibromuscular dysplasia or significant stenosis. IMA: Patent without evidence of aneurysm, dissection, vasculitis or significant stenosis. Inflow: Patent without evidence of aneurysm, dissection, vasculitis or significant stenosis. Veins: No obvious venous abnormality within the limitations of this arterial phase study.  Review of the MIP images confirms the above findings. NON-VASCULAR Hepatobiliary: Normal hepatic contour and morphology. No discrete hepatic lesion. The gallbladder is surgically absent. There is small volume pneumobilia presumably secondary to prior sphincterotomy. No intra or extrahepatic biliary ductal dilatation. Pancreas: Diffusely abnormal pancreas which is edematous and poorly defined. The peripancreatic stranding is present about the peripancreatic fat and extending into the root of the mesentery. No focal fluid collection or evidence of devascularization. Spleen: Normal in size without focal abnormality. Adrenals/Urinary Tract: Adrenal glands are unremarkable. Kidneys are normal, without renal calculi, focal lesion, or hydronephrosis. Bladder is unremarkable. Stomach/Bowel: No evidence of obstruction or focal bowel wall thickening. The appendix is surgically absent. The terminal ileum is unremarkable. Lymphatic: No suspicious lymphadenopathy. Reproductive: Surgical changes of prior hysterectomy. No adnexal masses. Other: No abdominal wall hernia or abnormality. No abdominopelvic ascites. Musculoskeletal: No acute or significant osseous findings. Review of the MIP images confirms the above findings. IMPRESSION: CTA CHEST 1. No evidence of aortic dissection or other acute arterial abnormality. 2. Mildly enlarged main pulmonary artery as can be seen in the setting of chronic pulmonary arterial hypertension.  3. Mild cardiomegaly. 4. Small hiatal hernia. CTA ABD/PELVIS 1. No evidence of acute aortic dissection, active hemorrhage or other acute vascular abnormality. 2. Diffusely abnormal pancreatitis which is edematous, poorly defined and demonstrates extensive peripancreatic stranding. Findings are concerning for recurrent acute pancreatitis. 3. Small volume pneumobilia presumably related to prior sphincterotomy and patency of the ampulla of Vater. 4. Small fat containing umbilical hernia. Electronically Signed    By: Jacqulynn Cadet M.D.   On: 08/08/2018 16:21     LOS: 3 days   Lala Lund, MD  [facility]  If 7PM-7AM, please contact night-coverage  Please page via [email or web address]-click on MD name and type text message  08/11/2018, 8:39 AM

## 2018-08-11 NOTE — Progress Notes (Signed)
PT Cancellation Note  Patient Details Name: Saman Giddens MRN: [id number] DOB: 08-09-1951   Cancelled Treatment:     Patient adamantly declined PT this morning second to worsening nausea and stomach pains. She would like to work with therapy in the afternoon. Therapy will follow up in the afternoon if time permits.     Carney Living PT DPT  08/11/2018, 10:29 AM

## 2018-08-12 ENCOUNTER — Ambulatory Visit: Payer: Medicare Other | Admitting: Family Medicine

## 2018-08-12 LAB — CBC
HCT: 35.5 % — ABNORMAL LOW (ref 36.0–46.0)
Hemoglobin: 11.3 g/dL — ABNORMAL LOW (ref 12.0–15.0)
MCH: 29 pg (ref 26.0–34.0)
MCHC: 31.8 g/dL (ref 30.0–36.0)
MCV: 91.3 fL (ref 80.0–100.0)
Platelets: 186 10*3/uL (ref 150–400)
RBC: 3.89 MIL/uL (ref 3.87–5.11)
RDW: 13.2 % (ref 11.5–15.5)
WBC: 6.6 10*3/uL (ref 4.0–10.5)
nRBC: 0 % (ref 0.0–0.2)

## 2018-08-12 LAB — COMPREHENSIVE METABOLIC PANEL
ALT: 17 U/L (ref 0–44)
AST: 17 U/L (ref 15–41)
Albumin: 2.9 g/dL — ABNORMAL LOW (ref 3.5–5.0)
Alkaline Phosphatase: 56 U/L (ref 38–126)
Anion gap: 8 (ref 5–15)
BUN: <5 mg/dL — ABNORMAL LOW (ref 8–23)
CO2: 23 mmol/L (ref 22–32)
Calcium: 8.3 mg/dL — ABNORMAL LOW (ref 8.9–10.3)
Chloride: 111 mmol/L (ref 98–111)
Creatinine, Ser: 0.65 mg/dL (ref 0.44–1.00)
GFR calc non Af Amer: >60 mL/min (ref >60)
Glucose, Bld: 102 mg/dL — ABNORMAL HIGH (ref 70–99)
Potassium: 3 mmol/L — ABNORMAL LOW (ref 3.5–5.1)
Sodium: 142 mmol/L (ref 135–145)
Total Bilirubin: 1 mg/dL (ref 0.3–1.2)
Total Protein: 5.6 g/dL — ABNORMAL LOW (ref 6.5–8.1)

## 2018-08-12 LAB — GLUCOSE, CAPILLARY
Glucose-Capillary: 99 mg/dL (ref 70–99)
Glucose-Capillary: 94 mg/dL (ref 70–99)

## 2018-08-12 LAB — MAGNESIUM: Magnesium: 1.8 mg/dL (ref 1.7–2.4)

## 2018-08-12 MED ORDER — HYDROCODONE-ACETAMINOPHEN 5-325 MG PO TABS: 1.0000 | ORAL_TABLET | Freq: Four times a day (QID) | ORAL | 0 refills | Status: DC | PRN

## 2018-08-12 MED ORDER — HYDROCODONE-ACETAMINOPHEN 5-325 MG PO TABS: 1.0000 | ORAL_TABLET | ORAL | Status: DC | PRN

## 2018-08-12 MED ORDER — POTASSIUM CHLORIDE CRYS ER 20 MEQ PO TBCR
40.0000 meq | EXTENDED_RELEASE_TABLET | Freq: Once | ORAL | Status: AC
  Administered 2018-08-12: 40 meq via ORAL
  Filled 2018-08-12: qty 2

## 2018-08-12 NOTE — Progress Notes (Signed)
Pt given all discharge orders by delcine RN.  All questions answered and pt verbalized understanding.  Pt discharged home with all belongings and prescriptions.

## 2018-08-12 NOTE — Discharge Summary (Signed)
Linda Blackburn MLJ:[id number] DOB: 29-Nov-1950 DOA: 08/08/2018  PCP: [name], MD  Admit date: 08/08/2018  Discharge date: 08/12/2018  Admitted From: Home   Disposition:  Home   Recommendations for Outpatient Follow-up:   Follow up with PCP in 1-2 weeks  PCP Please obtain BMP/CBC, 2 view CXR in 1week,  (see Discharge instructions)   PCP Please follow up on the following pending results:    Home Health: None   Equipment/Devices: None  Consultations: None Discharge Condition - Stable   CODE STATUS: Full   Diet Recommendation: Low-fat diet    Chief Complaint  Patient presents with  . Abdominal Pain     Brief history of present illness from the day of admission and additional interim summary    Patient is a 67 y.o. female with history of idiopathic recurrent pancreatitis (extensive work-up at [facility])-s/p remote cholecystectomy-apparently does not have pancreatic divisum as previously believed-now follwed at [facility]) presented with acute pancreatitis.  See below for further details                                                                  Hospital Course   Acute idiopathic recurrent pancreatitis: Pain has improved, she has had recurrent problem for several decades now, she is now following with GI team at [facility] in [place].  With her permission records obtained as below.  She is due for ERCP for possible sphincterectomy mid January 2020.  Her pain is improved and she is tolerating soft diet, will be discharged home with supportive care and soft low-fat diet.  Outpatient follow-up with PCP and her primary gastroenterologist at [facility].  She has an pending appointment before Christmas as well.  " From [facility]  IMPRESSION: Linda Blackburn is a 67 year old female with PMH acute recurrent  pancreatitis presenting for follow-up. Secretin stimulated MRCP showed conventional pancreatic duct anatomy and evidence of periampullary diverticulum. Overall, MRCP showed good exocrine function, no obvious obstruction. Discussed extensively risk/benefit of pursuing ERCP with pancreatic sphincterotomy, at this time patient wishes to hold off.  PLAN: -At this time, will hold off on ERCP with intervention but patient can let us know if she reconsiders -F/u in 1 year -Pt agrees to be a part of observational research study  Discussed with Dr Deirdre Pippins    Electronically signed by Melvia Heaps, Alford Highland, MD at 06/12/2018 11:17 AM EDT"  Results for Linda, Blackburn (MRN [id number]) as of 08/12/2018 08:48  Ref. Range 08/08/2018 15:42 08/09/2018 04:29 08/10/2018 05:03 08/11/2018 04:37  Lipase Latest Ref Range: 11 - 51 U/L >10,000 (H) 1,926 (H) 261 (H) 54 (H)      Transient hypotension with bradycardia: Occurred in the ED- resolved within a few hours-etiology remains unknown.  Apparently required vasopressors transiently.  Could have been due to  sepsis dehydration along with vasovagal response due to pain.  Resolved .  AKI: Due to hypotension and dehydration, resolved after IV fluids.  Hypokalemia.  Replaced request PCP to recheck next visit.  Hypertension: Controlled-continue home dose ARB post discharge.  GERD: Continue Pepcid  Depressions with anxiety: Stable-continue Zoloft     Discharge diagnosis     Principal Problem:   Recurrent acute pancreatitis Active Problems:   Hypertension   Adjustment disorder with mixed anxiety and depressed mood   Hypotension (arterial)   Sinus bradycardia on ECG   AKI (acute kidney injury) [facility])    Discharge instructions    Discharge Instructions    Discharge instructions   Complete by:  As directed    Follow with Primary MD [name], MD in 7 days   Get CBC, CMP, 2 view Chest X ray -  checked  by Primary MD in 5-7 days    Activity:  As tolerated with Full fall precautions use walker/cane & assistance as needed  Disposition Home    Diet: Soft Low-fat diet for the next 3 to 4 days then advance to regular consistency low-fat diet as tolerated.  Special Instructions: If you have smoked or chewed Tobacco  in the last 2 yrs please stop smoking, stop any regular Alcohol  and or any Recreational drug use.  On your next visit with your primary care physician please Get Medicines reviewed and adjusted.  Please request your Prim.MD to go over all Hospital Tests and Procedure/Radiological results at the follow up, please get all Hospital records sent to your Prim MD by signing hospital release before you go home.  If you experience worsening of your admission symptoms, develop shortness of breath, life threatening emergency, suicidal or homicidal thoughts you must seek medical attention immediately by calling 911 or calling your MD immediately  if symptoms less severe.  You Must read complete instructions/literature along with all the possible adverse reactions/side effects for all the Medicines you take and that have been prescribed to you. Take any new Medicines after you have completely understood and accpet all the possible adverse reactions/side effects.   Increase activity slowly   Complete by:  As directed       Discharge Medications   Allergies as of 08/12/2018      Reactions   Other Hives, Swelling   AQUASONIC [place] GEL   Buprenorphine Hcl    "In a scarry movie" weird thoughts   Codeine    GI upset   Morphine And Related    "In a scarry movie" weird thoughts      Medication List    TAKE these medications   cetirizine 10 MG tablet Commonly known as:  ZYRTEC Take 10 mg by mouth daily as needed for allergies.   famotidine 20 MG tablet Commonly known as:  PEPCID Take 20 mg by mouth daily.   fluconazole 100 MG tablet Commonly known as:  DIFLUCAN Take 1 tablet (100 mg total) by mouth daily.     HYDROcodone-acetaminophen 5-325 MG tablet Commonly known as:  NORCO/VICODIN Take 1 tablet by mouth every 6 (six) hours as needed for moderate pain.   losartan 100 MG tablet Commonly known as:  COZAAR Take 1 tablet (100 mg total) by mouth daily.   nystatin ointment Commonly known as:  MYCOSTATIN Apply 1 application topically 2 (two) times daily.   ondansetron 4 MG disintegrating tablet Commonly known as:  ZOFRAN-ODT Take 1 tablet (4 mg total) by mouth every 8 (eight) hours  as needed.   sertraline 100 MG tablet Commonly known as:  ZOLOFT Take 1 tablet (100 mg total) by mouth daily.   zolpidem 5 MG tablet Commonly known as:  AMBIEN Take 1 tablet (5 mg total) by mouth at bedtime as needed for sleep.       Follow-up Information    Burchette, Alinda Sierras, MD. Schedule an appointment as soon as possible for a visit in 1 week(s).   Specialty:  Family Medicine Contact information: [place] [address] [phone]           Major procedures and Radiology Reports - PLEASE review detailed and final reports thoroughly  -        Ct Angio Chest/abd/pel For Dissection W And/or Wo Contrast  Result Date: 08/08/2018 CLINICAL DATA:  67 year old female with tearing pain in the mid abdomen, hypotension. Evaluate for aortic dissection/aneurysm rupture. Patient has a known history of prior pancreatitis. EXAM: CT ANGIOGRAPHY CHEST, ABDOMEN AND PELVIS TECHNIQUE: Multidetector CT imaging through the chest, abdomen and pelvis was performed using the standard protocol during bolus administration of intravenous contrast. Multiplanar reconstructed images and MIPs were obtained and reviewed to evaluate the vascular anatomy. CONTRAST:  179mL ISOVUE-370 IOPAMIDOL (ISOVUE-370) INJECTION 76% COMPARISON:  Prior CT scan of the abdomen and pelvis 01/01/2018 FINDINGS: CTA CHEST FINDINGS Cardiovascular: Initial nonenhanced CT imaging demonstrates no evidence of acute intramural hematoma.  Following administration of intravenous contrast, the aorta is well opacified. There is no evidence of aneurysm, dissection or other acute abnormality. Two vessel aortic arch anatomy. The right brachiocephalic and left common carotid artery share a common origin. The main pulmonary artery is enlarged with a maximal diameter of 3 cm. Mild cardiomegaly. No pericardial effusion. Mediastinum/Nodes: Unremarkable CT appearance of the thyroid gland. No suspicious mediastinal or hilar adenopathy. No soft tissue mediastinal mass. Small hiatal hernia. Lungs/Pleura: The lungs are essentially clear save for some mild dependent atelectasis in a few scattered calcified granulomas. No edema, pneumothorax or pleural effusion. Musculoskeletal: No acute fracture or aggressive appearing lytic or blastic osseous lesion. Review of the MIP images confirms the above findings. CTA ABDOMEN AND PELVIS FINDINGS VASCULAR Aorta: Normal caliber aorta without aneurysm, dissection, vasculitis or significant stenosis. Celiac: Patent without evidence of aneurysm, dissection, vasculitis or significant stenosis. SMA: Patent without evidence of aneurysm, dissection, vasculitis or significant stenosis. Renals: Both renal arteries are patent without evidence of aneurysm, dissection, vasculitis, fibromuscular dysplasia or significant stenosis. IMA: Patent without evidence of aneurysm, dissection, vasculitis or significant stenosis. Inflow: Patent without evidence of aneurysm, dissection, vasculitis or significant stenosis. Veins: No obvious venous abnormality within the limitations of this arterial phase study. Review of the MIP images confirms the above findings. NON-VASCULAR Hepatobiliary: Normal hepatic contour and morphology. No discrete hepatic lesion. The gallbladder is surgically absent. There is small volume pneumobilia presumably secondary to prior sphincterotomy. No intra or extrahepatic biliary ductal dilatation. Pancreas: Diffusely abnormal  pancreas which is edematous and poorly defined. The peripancreatic stranding is present about the peripancreatic fat and extending into the root of the mesentery. No focal fluid collection or evidence of devascularization. Spleen: Normal in size without focal abnormality. Adrenals/Urinary Tract: Adrenal glands are unremarkable. Kidneys are normal, without renal calculi, focal lesion, or hydronephrosis. Bladder is unremarkable. Stomach/Bowel: No evidence of obstruction or focal bowel wall thickening. The appendix is surgically absent. The terminal ileum is unremarkable. Lymphatic: No suspicious lymphadenopathy. Reproductive: Surgical changes of prior hysterectomy. No adnexal masses. Other: No abdominal wall hernia or abnormality. No  abdominopelvic ascites. Musculoskeletal: No acute or significant osseous findings. Review of the MIP images confirms the above findings. IMPRESSION: CTA CHEST 1. No evidence of aortic dissection or other acute arterial abnormality. 2. Mildly enlarged main pulmonary artery as can be seen in the setting of chronic pulmonary arterial hypertension. 3. Mild cardiomegaly. 4. Small hiatal hernia. CTA ABD/PELVIS 1. No evidence of acute aortic dissection, active hemorrhage or other acute vascular abnormality. 2. Diffusely abnormal pancreatitis which is edematous, poorly defined and demonstrates extensive peripancreatic stranding. Findings are concerning for recurrent acute pancreatitis. 3. Small volume pneumobilia presumably related to prior sphincterotomy and patency of the ampulla of Vater. 4. Small fat containing umbilical hernia. Electronically Signed   By: Jacqulynn Cadet M.D.   On: 08/08/2018 16:21    Micro Results    Recent Results (from the past 240 hour(s))  MRSA PCR Screening     Status: None   Collection Time: 08/09/18 12:10 PM  Result Value Ref Range Status   MRSA by PCR NEGATIVE NEGATIVE Final    Comment:        The GeneXpert MRSA Assay (FDA approved for NASAL  specimens only), is one component of a comprehensive MRSA colonization surveillance program. It is not intended to diagnose MRSA infection nor to guide or monitor treatment for MRSA infections. Performed at [facility], [place] [address] [place], [place] [address]     Today   Subjective    Linda Blackburn today has no headache,no chest abdominal pain,no new weakness tingling or numbness, feels much better wants to go home today.    Objective   Blood pressure 120/66, pulse 63, temperature 97.9 F (36.6 C), resp. rate 16, SpO2 98 %.   Intake/Output Summary (Last 24 hours) at 08/12/2018 0848 Last data filed at 08/12/2018 0342 Gross per 24 hour  Intake 3129.69 ml  Output -  Net 3129.69 ml    Exam Awake Alert, Oriented x 3, No new F.N deficits, Normal affect [place].AT,PERRAL Supple Neck,No JVD, No cervical lymphadenopathy appriciated.  Symmetrical Chest wall movement, Good air movement bilaterally, CTAB RRR,No Gallops,Rubs or new Murmurs, No Parasternal Heave +ve B.Sounds, Abd Soft, Non tender, No organomegaly appriciated, No rebound -guarding or rigidity. No Cyanosis, Clubbing or edema, No new Rash or bruise   Data Review   CBC w Diff:  Lab Results  Component Value Date   WBC 6.6 08/12/2018   HGB 11.3 (L) 08/12/2018   HCT 35.5 (L) 08/12/2018   PLT 186 08/12/2018   LYMPHOPCT 18 08/09/2018   MONOPCT 7 08/09/2018   EOSPCT 1 08/09/2018   BASOPCT 0 08/09/2018    CMP:  Lab Results  Component Value Date   NA 142 08/12/2018   K 3.0 (L) 08/12/2018   CL 111 08/12/2018   CO2 23 08/12/2018   BUN <5 (L) 08/12/2018   CREATININE 0.65 08/12/2018   PROT 5.6 (L) 08/12/2018   ALBUMIN 2.9 (L) 08/12/2018   BILITOT 1.0 08/12/2018   ALKPHOS 56 08/12/2018   AST 17 08/12/2018   ALT 17 08/12/2018  .   Total Time in preparing paper work, data evaluation and todays exam - 53 minutes  Lala Lund M.D on 08/12/2018 at 8:48 AM  [facility]   Office  [phone]

## 2018-08-12 NOTE — Care Management Note (Signed)
Case Management Note  Patient Details  Name: Linda Blackburn MRN: [id number] Date of Birth: 06/23/1951  Subjective/Objective:       Acute pancreatitis. Hx of history of idiopathic recurrent pancreatitis ,s/p remote cholecystectomy  From home alone. Independent with ADL's., no DME needs.             Mali Settles Pandora Leiter) Basilio [place]    [phone]      PCP: Carolann Littler  Action/Plan: Transition to home.   Expected Discharge Date:  08/12/18               Expected Discharge Plan:  Home/Self Care  In-House Referral:  NA  Discharge planning Services  NA  Post Acute Care Choice:  NA Choice offered to:  NA  DME Arranged:  N/A DME Agency:  NA  HH Arranged:  NA HH Agency:     Status of Service:  Completed, signed off  If discussed at [place] of Stay Meetings, dates discussed:    Additional Comments:  Sharin Mons, RN 08/12/2018, 1:28 PM

## 2018-08-12 NOTE — Progress Notes (Signed)
Patient had a small bright red bowel movement.  Notified Jeannette Corpus, NP.  Will continue to monitor the patient.

## 2018-08-12 NOTE — Progress Notes (Signed)
[name] discharged Home per MD order.  Discharge instructions reviewed and discussed with the patient, all questions and concerns answered. Copy of instructions, care notes for diet, diagnosis & medication and script given to patient.  Allergies as of 08/12/2018      Reactions   Other Hives, Swelling   AQUASONIC [place] GEL   Buprenorphine Hcl    "In a scarry movie" weird thoughts   Codeine    GI upset   Morphine And Related    "In a scarry movie" weird thoughts      Medication List    TAKE these medications   cetirizine 10 MG tablet Commonly known as:  ZYRTEC Take 10 mg by mouth daily as needed for allergies.   famotidine 20 MG tablet Commonly known as:  PEPCID Take 20 mg by mouth daily.   fluconazole 100 MG tablet Commonly known as:  DIFLUCAN Take 1 tablet (100 mg total) by mouth daily.   HYDROcodone-acetaminophen 5-325 MG tablet Commonly known as:  NORCO/VICODIN Take 1 tablet by mouth every 6 (six) hours as needed for moderate pain.   losartan 100 MG tablet Commonly known as:  COZAAR Take 1 tablet (100 mg total) by mouth daily.   nystatin ointment Commonly known as:  MYCOSTATIN Apply 1 application topically 2 (two) times daily.   ondansetron 4 MG disintegrating tablet Commonly known as:  ZOFRAN-ODT Take 1 tablet (4 mg total) by mouth every 8 (eight) hours as needed.   sertraline 100 MG tablet Commonly known as:  ZOLOFT Take 1 tablet (100 mg total) by mouth daily.   zolpidem 5 MG tablet Commonly known as:  AMBIEN Take 1 tablet (5 mg total) by mouth at bedtime as needed for sleep.        Patient escorted to car by NT/volunteer in a wheelchair,  no distress noted upon discharge.  Adriaan Maltese C 08/12/2018 11:50 AM

## 2018-08-12 NOTE — Discharge Instructions (Signed)
Follow with Primary MD [name], MD in 7 days   Get CBC, CMP, 2 view Chest X ray -  checked  by Primary MD in 5-7 days    Activity: As tolerated with Full fall precautions use walker/cane & assistance as needed  Disposition Home    Diet: Soft Low-fat diet for the next 3 to 4 days then advance to regular consistency low-fat diet as tolerated.  Special Instructions: If you have smoked or chewed Tobacco  in the last 2 yrs please stop smoking, stop any regular Alcohol  and or any Recreational drug use.  On your next visit with your primary care physician please Get Medicines reviewed and adjusted.  Please request your Prim.MD to go over all Hospital Tests and Procedure/Radiological results at the follow up, please get all Hospital records sent to your Prim MD by signing hospital release before you go home.  If you experience worsening of your admission symptoms, develop shortness of breath, life threatening emergency, suicidal or homicidal thoughts you must seek medical attention immediately by calling 911 or calling your MD immediately  if symptoms less severe.  You Must read complete instructions/literature along with all the possible adverse reactions/side effects for all the Medicines you take and that have been prescribed to you. Take any new Medicines after you have completely understood and accpet all the possible adverse reactions/side effects.

## 2018-08-13 ENCOUNTER — Telehealth: Payer: Self-pay | Admitting: *Deleted

## 2018-08-13 NOTE — Telephone Encounter (Signed)
Transition Care Management Follow-up Telephone Call  Admit date: 08/08/2018  Discharge date: 08/12/2018  Admitted From: Home   Disposition:  Home   Recommendations for Outpatient Follow-up:   Follow up with PCP in 1-2 weeks  PCP Please obtain BMP/CBC, 2 view CXR in 1week,  (see Discharge instructions)   PCP Please follow up on the following pending results:    Home Health: None   Equipment/Devices: None  Consultations: None Discharge Condition - Stable   CODE STATUS: Full   Diet Recommendation: Low-fat diet   How have you been since you were released from the hospital? Some weakness   Do you understand why you were in the hospital? yes   Do you understand the discharge instructions? yes   Where were you discharged to? Home    Items Reviewed:  Medications reviewed: yes  Allergies reviewed: yes  Dietary changes reviewed: yes  Referrals reviewed: yes   Functional Questionnaire:   Activities of Daily Living (ADLs):   She states they are independent in the following: none States they require assistance with the following: none   Any transportation issues/concerns?: no   Any patient concerns? no   Confirmed importance and date/time of follow-up visits scheduled yes  Provider Appointment booked with Dr Elease Hashimoto 08/19/18 at 10 am.  Confirmed with patient if condition begins to worsen call PCP or go to the ER.  Patient was given the office number and encouraged to call back with question or concerns.  : yes

## 2018-08-19 ENCOUNTER — Encounter: Payer: Self-pay | Admitting: Family Medicine

## 2018-08-19 ENCOUNTER — Other Ambulatory Visit: Payer: Self-pay

## 2018-08-19 ENCOUNTER — Ambulatory Visit (INDEPENDENT_AMBULATORY_CARE_PROVIDER_SITE_OTHER): Payer: Medicare Other | Admitting: Family Medicine

## 2018-08-19 VITALS — BP 100/74 | HR 94 | Temp 98.3°F | Wt 199.8 lb

## 2018-08-19 DIAGNOSIS — E876 Hypokalemia: Secondary | ICD-10-CM

## 2018-08-19 DIAGNOSIS — Z8719 Personal history of other diseases of the digestive system: Secondary | ICD-10-CM

## 2018-08-19 DIAGNOSIS — D649 Anemia, unspecified: Secondary | ICD-10-CM | POA: Diagnosis not present

## 2018-08-19 DIAGNOSIS — F339 Major depressive disorder, recurrent, unspecified: Secondary | ICD-10-CM

## 2018-08-19 DIAGNOSIS — I10 Essential (primary) hypertension: Secondary | ICD-10-CM | POA: Diagnosis not present

## 2018-08-19 DIAGNOSIS — K859 Acute pancreatitis without necrosis or infection, unspecified: Secondary | ICD-10-CM

## 2018-08-19 LAB — CBC WITH DIFFERENTIAL/PLATELET
BASOS PCT: 1.4 % (ref 0.0–3.0)
Basophils Absolute: 0.1 10*3/uL (ref 0.0–0.1)
Eosinophils Absolute: 0.2 10*3/uL (ref 0.0–0.7)
Eosinophils Relative: 3 % (ref 0.0–5.0)
HCT: 46.3 % — ABNORMAL HIGH (ref 36.0–46.0)
Hemoglobin: 15.5 g/dL — ABNORMAL HIGH (ref 12.0–15.0)
Lymphocytes Relative: 45.7 % (ref 12.0–46.0)
Lymphs Abs: 2.6 10*3/uL (ref 0.7–4.0)
MCHC: 33.4 g/dL (ref 30.0–36.0)
MCV: 88.4 fl (ref 78.0–100.0)
Monocytes Absolute: 0.4 10*3/uL (ref 0.1–1.0)
Monocytes Relative: 7.6 % (ref 3.0–12.0)
Neutro Abs: 2.4 10*3/uL (ref 1.4–7.7)
Neutrophils Relative %: 42.3 % — ABNORMAL LOW (ref 43.0–77.0)
Platelets: 344 10*3/uL (ref 150.0–400.0)
RBC: 5.24 Mil/uL — ABNORMAL HIGH (ref 3.87–5.11)
RDW: 13.5 % (ref 11.5–15.5)
WBC: 5.8 10*3/uL (ref 4.0–10.5)

## 2018-08-19 LAB — BASIC METABOLIC PANEL
BUN: 15 mg/dL (ref 6–23)
CO2: 22 mEq/L (ref 19–32)
Calcium: 9.7 mg/dL (ref 8.4–10.5)
Chloride: 107 mEq/L (ref 96–112)
Creatinine, Ser: 0.69 mg/dL (ref 0.40–1.20)
GFR: 89.49 mL/min (ref >60.00)
Glucose, Bld: 95 mg/dL (ref 70–99)
POTASSIUM: 4.2 meq/L (ref 3.5–5.1)
Sodium: 139 mEq/L (ref 135–145)

## 2018-08-19 MED ORDER — FLUCONAZOLE 100 MG PO TABS: 100.0000 mg | ORAL_TABLET | ORAL | 0 refills | Status: DC | PRN

## 2018-08-19 MED ORDER — SERTRALINE HCL 100 MG PO TABS: 100.0000 mg | ORAL_TABLET | Freq: Every day | ORAL | 3 refills | Status: DC

## 2018-08-19 MED ORDER — ONDANSETRON 4 MG PO TBDP: 4.0000 mg | ORAL_TABLET | Freq: Three times a day (TID) | ORAL | 1 refills | Status: DC | PRN

## 2018-08-19 MED ORDER — LOSARTAN POTASSIUM 100 MG PO TABS: 100.0000 mg | ORAL_TABLET | Freq: Every day | ORAL | 3 refills | Status: DC

## 2018-08-19 NOTE — Progress Notes (Signed)
Subjective:     Patient ID: Linda Blackburn, female   DOB: [birth year], 67 y.o.   MRN: [id number]  HPI Patient is seen for hospital follow-up.  She has had longstanding history of idiopathic recurrent pancreatitis and was admitted again for similar episode December 5 and discharged on the ninth.  She has had extensive work-up both at [facility] and also up in [place] and has pending ERCP with proposed sphincterotomy January 6 at [facility].  She has never been able to figure out any specific triggers.  She is much improved at this time.  Her initial lipase was over 10,000.  When she presented to the ED she had what she described as a tearing type pain upper abdomen with some hypotension and had CT angiogram to rule out aneurysm.  No acute abnormalities were noted.  Patient had electrolyte abnormalities as usual with hypokalemia.  She had slight anemia with hemoglobin at discharge 11.3.  She has not had any fever, nausea, vomiting.  Still has some mild upper abdominal soreness which is typical following her flares.  Past Medical History:  Diagnosis Date  . Abnormal uterine bleeding   . Amenorrhea   . Anxiety   . Depression   . Gallstones   . Hay fever   . HTN (hypertension)   . Pancreatitis   . Urine incontinence   . UTI (urinary tract infection)    reoccuring    Past Surgical History:  Procedure Laterality Date  . ABDOMINAL HYSTERECTOMY  1984   fibroids  . APPENDECTOMY  1984  . CHOLECYSTECTOMY  09/08/1999  . ERCP W/ SPHINCTEROTOMY AND BALLOON DILATION  08/2008  . OOPHORECTOMY     Only 1 was taken  . TONSILLECTOMY    . TUBAL LIGATION      reports that she has never smoked. She has never used smokeless tobacco. She reports current alcohol use. She reports that she does not use drugs. family history includes Alcohol abuse in her mother; Hiatal hernia in her maternal aunt; Prostate cancer in her father and paternal grandfather; Stroke in her maternal aunt. Allergies  Allergen Reactions  .  Other Hives and Swelling    AQUASONIC [place] GEL  . Buprenorphine Hcl     "In a scarry movie" weird thoughts  . Codeine     GI upset  . Morphine And Related     "In a scarry movie" weird thoughts     Review of Systems  Constitutional: Negative for chills and fever.  Respiratory: Negative for shortness of breath and wheezing.   Cardiovascular: Negative for chest pain.  Gastrointestinal: Negative for abdominal pain, nausea and vomiting.  Genitourinary: Negative for dysuria.       Objective:   Physical Exam Constitutional:      Appearance: She is well-developed.  Eyes:     Pupils: Pupils are equal, round, and reactive to light.  Neck:     Musculoskeletal: Neck supple.     Thyroid: No thyromegaly.     Vascular: No JVD.  Cardiovascular:     Rate and Rhythm: Normal rate and regular rhythm.     Heart sounds: No gallop.   Pulmonary:     Effort: Pulmonary effort is normal. No respiratory distress.     Breath sounds: Normal breath sounds. No wheezing or rales.  Neurological:     Mental Status: She is alert.        Assessment:     #1 history of recurrent idiopathic pancreatitis  #2 hypokalemia  #3 mild anemia  #  4 hypertension controlled on losartan  #5 hx of recurrent depression - stable on Sertraline.    Plan:     -check labs with CBC and basic metabolic panel -follow up immediately for any recurrent abdominal pain, nausea, or other concerns. -refilled all of her regular medications for one year.  [name] MD [facility]

## 2018-08-19 NOTE — Patient Instructions (Signed)
Set up follow up in late January for skin lesion excision

## 2018-09-09 DIAGNOSIS — I10 Essential (primary) hypertension: Secondary | ICD-10-CM | POA: Diagnosis not present

## 2018-09-09 DIAGNOSIS — K9189 Other postprocedural complications and disorders of digestive system: Secondary | ICD-10-CM | POA: Diagnosis not present

## 2018-09-09 DIAGNOSIS — Z9851 Tubal ligation status: Secondary | ICD-10-CM | POA: Diagnosis not present

## 2018-09-09 DIAGNOSIS — F411 Generalized anxiety disorder: Secondary | ICD-10-CM | POA: Diagnosis not present

## 2018-09-09 DIAGNOSIS — F419 Anxiety disorder, unspecified: Secondary | ICD-10-CM | POA: Diagnosis not present

## 2018-09-09 DIAGNOSIS — F329 Major depressive disorder, single episode, unspecified: Secondary | ICD-10-CM | POA: Diagnosis not present

## 2018-09-09 DIAGNOSIS — Z9089 Acquired absence of other organs: Secondary | ICD-10-CM | POA: Diagnosis not present

## 2018-09-09 DIAGNOSIS — Z6836 Body mass index (BMI) 36.0-36.9, adult: Secondary | ICD-10-CM | POA: Diagnosis not present

## 2018-09-09 DIAGNOSIS — K859 Acute pancreatitis without necrosis or infection, unspecified: Secondary | ICD-10-CM | POA: Diagnosis not present

## 2018-09-09 DIAGNOSIS — K5710 Diverticulosis of small intestine without perforation or abscess without bleeding: Secondary | ICD-10-CM | POA: Diagnosis not present

## 2018-09-09 DIAGNOSIS — Z90710 Acquired absence of both cervix and uterus: Secondary | ICD-10-CM | POA: Diagnosis not present

## 2018-09-09 DIAGNOSIS — K219 Gastro-esophageal reflux disease without esophagitis: Secondary | ICD-10-CM | POA: Diagnosis not present

## 2018-09-09 DIAGNOSIS — E669 Obesity, unspecified: Secondary | ICD-10-CM | POA: Diagnosis not present

## 2018-09-09 DIAGNOSIS — Z9049 Acquired absence of other specified parts of digestive tract: Secondary | ICD-10-CM | POA: Diagnosis not present

## 2018-09-09 DIAGNOSIS — R1013 Epigastric pain: Secondary | ICD-10-CM | POA: Diagnosis not present

## 2018-09-09 DIAGNOSIS — K858 Other acute pancreatitis without necrosis or infection: Secondary | ICD-10-CM | POA: Diagnosis not present

## 2018-09-10 DIAGNOSIS — Z9049 Acquired absence of other specified parts of digestive tract: Secondary | ICD-10-CM | POA: Diagnosis not present

## 2018-09-10 DIAGNOSIS — K9189 Other postprocedural complications and disorders of digestive system: Secondary | ICD-10-CM | POA: Diagnosis not present

## 2018-09-10 DIAGNOSIS — K5710 Diverticulosis of small intestine without perforation or abscess without bleeding: Secondary | ICD-10-CM | POA: Diagnosis not present

## 2018-09-10 DIAGNOSIS — K859 Acute pancreatitis without necrosis or infection, unspecified: Secondary | ICD-10-CM | POA: Diagnosis not present

## 2018-09-10 DIAGNOSIS — K858 Other acute pancreatitis without necrosis or infection: Secondary | ICD-10-CM | POA: Diagnosis not present

## 2018-09-10 DIAGNOSIS — K219 Gastro-esophageal reflux disease without esophagitis: Secondary | ICD-10-CM | POA: Diagnosis not present

## 2018-09-10 DIAGNOSIS — R001 Bradycardia, unspecified: Secondary | ICD-10-CM | POA: Diagnosis not present

## 2018-09-10 DIAGNOSIS — R1013 Epigastric pain: Secondary | ICD-10-CM | POA: Diagnosis not present

## 2018-09-13 ENCOUNTER — Telehealth: Payer: Self-pay | Admitting: Gastroenterology

## 2018-09-13 NOTE — Telephone Encounter (Signed)
Pt called to inform that she has ERCP with Dr. Deirdre Pippins in [place] on 09/09/18. Pt just schedule an appt with Dr. Ardis Hughs 10/18/18 as f/u.

## 2018-10-02 ENCOUNTER — Ambulatory Visit (INDEPENDENT_AMBULATORY_CARE_PROVIDER_SITE_OTHER): Payer: Medicare Other | Admitting: Family Medicine

## 2018-10-02 ENCOUNTER — Encounter: Payer: Self-pay | Admitting: Family Medicine

## 2018-10-02 ENCOUNTER — Other Ambulatory Visit: Payer: Self-pay

## 2018-10-02 VITALS — BP 108/70 | HR 86 | Temp 98.2°F | Ht 61.0 in | Wt 201.6 lb

## 2018-10-02 DIAGNOSIS — R229 Localized swelling, mass and lump, unspecified: Secondary | ICD-10-CM

## 2018-10-02 DIAGNOSIS — C44712 Basal cell carcinoma of skin of right lower limb, including hip: Secondary | ICD-10-CM

## 2018-10-02 NOTE — Patient Instructions (Signed)
Keep wound dry for the first 24 hours then clean daily with soap and water for one week. Apply vaseline or Neosporin daily for 3-4 days. Keep covered with clean dressing for 4-5 days. Follow up promptly for any signs of infection such as redness, warmth, pain, or drainage. Return in 9 days for suture removal.

## 2018-10-02 NOTE — Progress Notes (Signed)
  Subjective:     Patient ID: Linda Blackburn, female   DOB: Nov 04, 1950, 68 y.o.   MRN: [id number]  HPI Patient is here for skin lesion excision right upper thigh. This is a procedure only visit (no E and M).  We had noted nodule on prior visit. She noted this over a year ago.  Gradually growing in size.  Nonpainful.  Asymptomatic.  No bleeding or itching.  Past Medical History:  Diagnosis Date  . Abnormal uterine bleeding   . Amenorrhea   . Anxiety   . Depression   . Gallstones   . Hay fever   . HTN (hypertension)   . Pancreatitis   . Urine incontinence   . UTI (urinary tract infection)    reoccuring    Past Surgical History:  Procedure Laterality Date  . ABDOMINAL HYSTERECTOMY  1984   fibroids  . APPENDECTOMY  1984  . CHOLECYSTECTOMY  09/08/1999  . ERCP W/ SPHINCTEROTOMY AND BALLOON DILATION  08/2008  . OOPHORECTOMY     Only 1 was taken  . TONSILLECTOMY    . TUBAL LIGATION      reports that she has never smoked. She has never used smokeless tobacco. She reports current alcohol use. She reports that she does not use drugs. family history includes Alcohol abuse in her mother; Hiatal hernia in her maternal aunt; Prostate cancer in her father and paternal grandfather; Stroke in her maternal aunt. Allergies  Allergen Reactions  . Other Hives and Swelling    AQUASONIC [place] GEL  . Buprenorphine Hcl     "In a scarry movie" weird thoughts  . Codeine     GI upset  . Morphine And Related     "In a scarry movie" weird thoughts     Review of Systems  Constitutional: Negative for appetite change, chills, fever and unexpected weight change.  Hematological: Negative for adenopathy.       Objective:   Physical Exam Constitutional:      Appearance: Normal appearance.  Cardiovascular:     Rate and Rhythm: Normal rate and regular rhythm.  Skin:    Comments: 1.2 by 1.0 cm slightly raised skin nodule with a few superficial telangiectasias.  Neurological:     Mental Status: She is  alert.        Assessment:     Skin nodule right upper lateral thigh.  Rule out [facility]    Plan:     We recommend elliptical excision.  Discussed risks including risk of bleeding, bruising, infection, scarring and pt consented.  Local anesthesia with 1% plain xylocaine.  Prepped skin with betadine    Used sterile technique throughout.  Using #15 blade made 5 cm elliptical excision.  Minimal bleeding.  Pt tolerated well.  7 sutures of 4-0 ethilon placed.  Topical antibiotic and dressing applied Wound care instruction given.  Return in 9 days for suture removal.  Specimen sent to [facility].  [name] MD [facility] at [facility]

## 2018-10-11 ENCOUNTER — Ambulatory Visit (INDEPENDENT_AMBULATORY_CARE_PROVIDER_SITE_OTHER): Payer: Medicare Other

## 2018-10-11 ENCOUNTER — Other Ambulatory Visit: Payer: Self-pay

## 2018-10-11 ENCOUNTER — Encounter: Payer: Self-pay | Admitting: Family Medicine

## 2018-10-11 ENCOUNTER — Ambulatory Visit (INDEPENDENT_AMBULATORY_CARE_PROVIDER_SITE_OTHER): Payer: Medicare Other | Admitting: Family Medicine

## 2018-10-11 VITALS — BP 120/78 | HR 88 | Resp 16 | Ht 61.0 in | Wt 200.0 lb

## 2018-10-11 VITALS — BP 120/78 | HR 88 | Temp 98.1°F | Ht 61.0 in | Wt 200.3 lb

## 2018-10-11 DIAGNOSIS — N393 Stress incontinence (female) (male): Secondary | ICD-10-CM | POA: Diagnosis not present

## 2018-10-11 DIAGNOSIS — D049 Carcinoma in situ of skin, unspecified: Secondary | ICD-10-CM

## 2018-10-11 DIAGNOSIS — Z0000 Encounter for general adult medical examination without abnormal findings: Secondary | ICD-10-CM

## 2018-10-11 NOTE — Progress Notes (Signed)
  Subjective:     Patient ID: Linda Blackburn, female   DOB: [birth year], 68 y.o.   MRN: [id number]  HPI Patient is here for the following  Recent nodular lesion right upper thigh.  We performed excisional biopsy and this came back basal cell carcinoma with clear margins.  She has had uneventful healing.  No pain.  No drainage.  She is here for suture removal but also has a separate issue which is a new evaluation as below  She has had some progressive urine stress incontinence over the past couple years.  She has occasional suprapubic pains and sensation of increased "pressure "pelvic region.  She has had follow-up with her gynecologist and had ultrasound a year ago.  She had 2 vaginal deliveries.  She had previous hysterectomy so does not get follow-up Pap smears.  She is worried about cystocele issues.  She thinks this is becoming more symptomatic over time.  She would like to see urologist.  She has previously tried some Kegel exercises without much improvement  Past Medical History:  Diagnosis Date  . Abnormal uterine bleeding   . Amenorrhea   . Anxiety   . Depression   . Gallstones   . Hay fever   . HTN (hypertension)   . Pancreatitis   . Urine incontinence   . UTI (urinary tract infection)    reoccuring    Past Surgical History:  Procedure Laterality Date  . ABDOMINAL HYSTERECTOMY  1984   fibroids  . APPENDECTOMY  1984  . CHOLECYSTECTOMY  09/08/1999  . ERCP W/ SPHINCTEROTOMY AND BALLOON DILATION  08/2008  . OOPHORECTOMY     Only 1 was taken  . TONSILLECTOMY    . TUBAL LIGATION      reports that she has never smoked. She has never used smokeless tobacco. She reports current alcohol use. She reports that she does not use drugs. family history includes Alcohol abuse in her mother; Hiatal hernia in her maternal aunt; Prostate cancer in her father and paternal grandfather; Stroke in her maternal aunt. Allergies  Allergen Reactions  . Other Hives and Swelling    AQUASONIC [place] GEL   . Buprenorphine Hcl     "In a scarry movie" weird thoughts  . Codeine     GI upset  . Morphine And Related     "In a scarry movie" weird thoughts     Review of Systems  Constitutional: Negative for chills and fever.  Genitourinary: Negative for difficulty urinating, dysuria, frequency, genital sores, hematuria and vaginal discharge.       Objective:   Physical Exam Constitutional:      Appearance: Normal appearance.  Genitourinary:    Comments: Bimanual exam reveals cystocele.  With cough she has some obvious urine leakage Skin:    Comments: Right lateral thigh wound is examined.  Healing well with no signs of secondary infection.  Sutures removed without difficulty.  Wound margins are healing well  Neurological:     Mental Status: She is alert.        Assessment:     #1 basal cell carcinoma right lateral thigh.  Wound margins were clear  #2 urinary stress incontinence    Plan:     -Sutures removed as above.  Follow closely for signs of recurrence  -Set up urology referral regarding her urinary stress incontinence  [name] MD [facility] at [facility]

## 2018-10-11 NOTE — Patient Instructions (Addendum)
Bring a copy of your living will and/or healthcare power of attorney to your next office visit.  Follow up with urology as planned.  It was great meeting you today, I appreciate you staying, and I wish you all the best!    Linda Blackburn , Thank you for taking time to come for your Medicare Wellness Visit. I appreciate your ongoing commitment to your health goals. Please review the following plan we discussed and let me know if I can assist you in the future.   These are the goals we discussed: Goals    . patient     Will continue on your journey   May want to check out Dr. Dennard Nip at the [facility] [phone] and is located at [facility]     . Patient Stated     Maintain independence, follow up with urology. "Take one day at a time"       This is a list of the screening recommended for you and due dates:  Health Maintenance  Topic Date Due  . Colon Cancer Screening  11/03/2019  . Tetanus Vaccine  04/01/2020  . Mammogram  06/19/2020  . Flu Shot  Completed  . DEXA scan (bone density measurement)  Completed  .  Hepatitis C: One time screening is recommended by Center for Disease Control  (CDC) for  adults born from [birth year] through 1967.   Completed  . Pneumonia vaccines  Completed    Health Maintenance, Female Adopting a healthy lifestyle and getting preventive care can go a long way to promote health and wellness. Talk with your health care provider about what schedule of regular examinations is right for you. This is a good chance for you to check in with your provider about disease prevention and staying healthy. In between checkups, there are plenty of things you can do on your own. Experts have done a lot of research about which lifestyle changes and preventive measures are most likely to keep you healthy. Ask your health care provider for more information. Weight and diet Eat a healthy diet  Be sure to include plenty of vegetables, fruits, low-fat dairy products, and  lean protein.  Do not eat a lot of foods high in solid fats, added sugars, or salt.  Get regular exercise. This is one of the most important things you can do for your health. ? Most adults should exercise for at least 150 minutes each week. The exercise should increase your heart rate and make you sweat (moderate-intensity exercise). ? Most adults should also do strengthening exercises at least twice a week. This is in addition to the moderate-intensity exercise. Maintain a healthy weight  Body mass index (BMI) is a measurement that can be used to identify possible weight problems. It estimates body fat based on height and weight. Your health care provider can help determine your BMI and help you achieve or maintain a healthy weight.  For females 22 years of age and older: ? A BMI below 18.5 is considered underweight. ? A BMI of 18.5 to 24.9 is normal. ? A BMI of 25 to 29.9 is considered overweight. ? A BMI of 30 and above is considered obese. Watch levels of cholesterol and blood lipids  You should start having your blood tested for lipids and cholesterol at 70 years of age, then have this test every 5 years.  You may need to have your cholesterol levels checked more often if: ? Your lipid or cholesterol levels are high. ?  You are older than 70 years of age. ? You are at high risk for heart disease. Cancer screening Lung Cancer  Lung cancer screening is recommended for adults 59-73 years old who are at high risk for lung cancer because of a history of smoking.  A yearly low-dose CT scan of the lungs is recommended for people who: ? Currently smoke. ? Have quit within the past 15 years. ? Have at least a 30-pack-year history of smoking. A pack year is smoking an average of one pack of cigarettes a day for 1 year.  Yearly screening should continue until it has been 15 years since you quit.  Yearly screening should stop if you develop a health problem that would prevent you from  having lung cancer treatment. Breast Cancer  Practice breast self-awareness. This means understanding how your breasts normally appear and feel.  It also means doing regular breast self-exams. Let your health care provider know about any changes, no matter how small.  If you are in your 20s or 30s, you should have a clinical breast exam (CBE) by a health care provider every 1-3 years as part of a regular health exam.  If you are 25 or older, have a CBE every year. Also consider having a breast X-ray (mammogram) every year.  If you have a family history of breast cancer, talk to your health care provider about genetic screening.  If you are at high risk for breast cancer, talk to your health care provider about having an MRI and a mammogram every year.  Breast cancer gene (BRCA) assessment is recommended for women who have family members with BRCA-related cancers. BRCA-related cancers include: ? Breast. ? Ovarian. ? Tubal. ? Peritoneal cancers.  Results of the assessment will determine the need for genetic counseling and BRCA1 and BRCA2 testing. Cervical Cancer Your health care provider may recommend that you be screened regularly for cancer of the pelvic organs (ovaries, uterus, and vagina). This screening involves a pelvic examination, including checking for microscopic changes to the surface of your cervix (Pap test). You may be encouraged to have this screening done every 3 years, beginning at age 58.  For women ages 52-65, health care providers may recommend pelvic exams and Pap testing every 3 years, or they may recommend the Pap and pelvic exam, combined with testing for human papilloma virus (HPV), every 5 years. Some types of HPV increase your risk of cervical cancer. Testing for HPV may also be done on women of any age with unclear Pap test results.  Other health care providers may not recommend any screening for nonpregnant women who are considered low risk for pelvic cancer and  who do not have symptoms. Ask your health care provider if a screening pelvic exam is right for you.  If you have had past treatment for cervical cancer or a condition that could lead to cancer, you need Pap tests and screening for cancer for at least 20 years after your treatment. If Pap tests have been discontinued, your risk factors (such as having a new sexual partner) need to be reassessed to determine if screening should resume. Some women have medical problems that increase the chance of getting cervical cancer. In these cases, your health care provider may recommend more frequent screening and Pap tests. Colorectal Cancer  This type of cancer can be detected and often prevented.  Routine colorectal cancer screening usually begins at 70 years of age and continues through 70 years of age.  Your health  care provider may recommend screening at an earlier age if you have risk factors for colon cancer.  Your health care provider may also recommend using home test kits to check for hidden blood in the stool.  A small camera at the end of a tube can be used to examine your colon directly (sigmoidoscopy or colonoscopy). This is done to check for the earliest forms of colorectal cancer.  Routine screening usually begins at age 55.  Direct examination of the colon should be repeated every 5-10 years through 70 years of age. However, you may need to be screened more often if early forms of precancerous polyps or small growths are found. Skin Cancer  Check your skin from head to toe regularly.  Tell your health care provider about any new moles or changes in moles, especially if there is a change in a mole's shape or color.  Also tell your health care provider if you have a mole that is larger than the size of a pencil eraser.  Always use sunscreen. Apply sunscreen liberally and repeatedly throughout the day.  Protect yourself by wearing long sleeves, pants, a wide-brimmed hat, and sunglasses  whenever you are outside. Heart disease, diabetes, and high blood pressure  High blood pressure causes heart disease and increases the risk of stroke. High blood pressure is more likely to develop in: ? People who have blood pressure in the high end of the normal range (130-139/85-89 mm Hg). ? People who are overweight or obese. ? People who are African American.  If you are 58-33 years of age, have your blood pressure checked every 3-5 years. If you are 41 years of age or older, have your blood pressure checked every year. You should have your blood pressure measured twice-once when you are at a hospital or clinic, and once when you are not at a hospital or clinic. Record the average of the two measurements. To check your blood pressure when you are not at a hospital or clinic, you can use: ? An automated blood pressure machine at a pharmacy. ? A home blood pressure monitor.  If you are between 13 years and 9 years old, ask your health care provider if you should take aspirin to prevent strokes.  Have regular diabetes screenings. This involves taking a blood sample to check your fasting blood sugar level. ? If you are at a normal weight and have a low risk for diabetes, have this test once every three years after 70 years of age. ? If you are overweight and have a high risk for diabetes, consider being tested at a younger age or more often. Preventing infection Hepatitis B  If you have a higher risk for hepatitis B, you should be screened for this virus. You are considered at high risk for hepatitis B if: ? You were born in a country where hepatitis B is common. Ask your health care provider which countries are considered high risk. ? Your parents were born in a high-risk country, and you have not been immunized against hepatitis B (hepatitis B vaccine). ? You have HIV or AIDS. ? You use needles to inject street drugs. ? You live with someone who has hepatitis B. ? You have had sex with  someone who has hepatitis B. ? You get hemodialysis treatment. ? You take certain medicines for conditions, including cancer, organ transplantation, and autoimmune conditions. Hepatitis C  Blood testing is recommended for: ? Everyone born from [birth year] through 1967. ? Anyone with known  risk factors for hepatitis C. Sexually transmitted infections (STIs)  You should be screened for sexually transmitted infections (STIs) including gonorrhea and chlamydia if: ? You are sexually active and are younger than 70 years of age. ? You are older than 70 years of age and your health care provider tells you that you are at risk for this type of infection. ? Your sexual activity has changed since you were last screened and you are at an increased risk for chlamydia or gonorrhea. Ask your health care provider if you are at risk.  If you do not have HIV, but are at risk, it may be recommended that you take a prescription medicine daily to prevent HIV infection. This is called pre-exposure prophylaxis (PrEP). You are considered at risk if: ? You are sexually active and do not regularly use condoms or know the HIV status of your partner(s). ? You take drugs by injection. ? You are sexually active with a partner who has HIV. Talk with your health care provider about whether you are at high risk of being infected with HIV. If you choose to begin PrEP, you should first be tested for HIV. You should then be tested every 3 months for as long as you are taking PrEP. Pregnancy  If you are premenopausal and you may become pregnant, ask your health care provider about preconception counseling.  If you may become pregnant, take 400 to 800 micrograms (mcg) of folic acid every day.  If you want to prevent pregnancy, talk to your health care provider about birth control (contraception). Osteoporosis and menopause  Osteoporosis is a disease in which the bones lose minerals and strength with aging. This can result in  serious bone fractures. Your risk for osteoporosis can be identified using a bone density scan.  If you are 45 years of age or older, or if you are at risk for osteoporosis and fractures, ask your health care provider if you should be screened.  Ask your health care provider whether you should take a calcium or vitamin D supplement to lower your risk for osteoporosis.  Menopause may have certain physical symptoms and risks.  Hormone replacement therapy may reduce some of these symptoms and risks. Talk to your health care provider about whether hormone replacement therapy is right for you. Follow these instructions at home:  Schedule regular health, dental, and eye exams.  Stay current with your immunizations.  Do not use any tobacco products including cigarettes, chewing tobacco, or electronic cigarettes.  If you are pregnant, do not drink alcohol.  If you are breastfeeding, limit how much and how often you drink alcohol.  Limit alcohol intake to no more than 1 drink per day for nonpregnant women. One drink equals 12 ounces of beer, 5 ounces of wine, or 1 ounces of hard liquor.  Do not use street drugs.  Do not share needles.  Ask your health care provider for help if you need support or information about quitting drugs.  Tell your health care provider if you often feel depressed.  Tell your health care provider if you have ever been abused or do not feel safe at home. This information is not intended to replace advice given to you by your health care provider. Make sure you discuss any questions you have with your health care provider. Document Released: 03/06/2011 Document Revised: 01/27/2016 Document Reviewed: 05/25/2015 Elsevier Interactive Patient Education  2019 [employer].

## 2018-10-11 NOTE — Progress Notes (Addendum)
Subjective:   Linda Blackburn is a 68 y.o. female who presents for Medicare Annual (Subsequent) preventive examination.  Review of Systems:  No ROS.  Medicare Wellness Visit. Additional risk factors are reflected in the social history.  Cardiac Risk Factors include: hypertension;sedentary lifestyle;obesity (BMI >30kg/m2);advanced age (>34men, >61 women) Sleep patterns: feels rested on waking and gets up 1 time nightly to void. Takes ambien rarely prn.    Home Safety/Smoke Alarms: Feels safe in home. Smoke alarms in place.  Living environment; residence and Firearm Safety: 1-story house/ trailer, apartment. No use or need for DME at this time.  Seat Belt Safety/Bike Helmet: Wears seat belt.   Female:   Pap- hysterectomy 1980s      Mammo- 06/2018       Dexa scan- 09/2015, declined follow-up at this time      CCS- 11/2014, due  11/2019, pt. Made aware.     Objective:     Vitals: BP 120/78   Pulse 88   Resp 16   Ht 5\' 1"  (1.549 m)   Wt 200 lb (90.7 kg)   SpO2 97%   BMI 37.79 kg/m   Body mass index is 37.79 kg/m.  Advanced Directives 10/11/2018 08/09/2018 08/08/2018 03/07/2018 01/01/2018 08/10/2017 03/20/2016  Does Patient Have a Medical Advance Directive? Yes Yes No Yes Yes Yes No  Type of Paramedic of [place];Living will Living will - [place];Living will Living will - -  Does patient want to make changes to medical advance directive? No - Patient declined No - Patient declined - No - Patient declined No - Patient declined - -  Copy of [place] in Chart? No - copy requested - - No - copy requested - - -  Would patient like information on creating a medical advance directive? - - - - - - -    Tobacco Social History   Tobacco Use  Smoking Status Never Smoker  Smokeless Tobacco Never Used     Counseling given: Not Answered  Past Medical History:  Diagnosis Date  . Abnormal uterine bleeding   . Amenorrhea   .  Anxiety   . Depression   . Gallstones   . Hay fever   . HTN (hypertension)   . Pancreatitis   . Urine incontinence   . UTI (urinary tract infection)    reoccuring    Past Surgical History:  Procedure Laterality Date  . ABDOMINAL HYSTERECTOMY  1984   fibroids  . APPENDECTOMY  1984  . CHOLECYSTECTOMY  09/08/1999  . ERCP W/ SPHINCTEROTOMY AND BALLOON DILATION  08/2008  . OOPHORECTOMY     Only 1 was taken  . TONSILLECTOMY    . TUBAL LIGATION     Family History  Problem Relation Age of Onset  . Alcohol abuse Mother   . Prostate cancer Father   . Stroke Maternal Aunt   . Hiatal hernia Maternal Aunt   . Prostate cancer Paternal Grandfather   . Cerebral palsy Granddaughter   . Breast cancer Neg Hx    Social History   Socioeconomic History  . Marital status: Widowed    Spouse name: Not on file  . Number of children: 2  . Years of education: Not on file  . Highest education level: Not on file  Occupational History  . Occupation: retired  Scientific laboratory technician  . Financial resource strain: Not hard at all  . Food insecurity:    Worry: Never true  Inability: Never true  . Transportation needs:    Medical: No    Non-medical: No  Tobacco Use  . Smoking status: Never Smoker  . Smokeless tobacco: Never Used  Substance and Sexual Activity  . Alcohol use: Yes    Alcohol/week: 0.0 standard drinks    Comment: occ - hardly every   . Drug use: No  . Sexual activity: Not Currently    Partners: Male    Birth control/protection: Surgical  Lifestyle  . Physical activity:    Days per week: 0 days    Minutes per session: 0 min  . Stress: Only a little  Relationships  . Social connections:    Talks on phone: Twice a week    Gets together: Once a week    Attends religious service: Never    Active member of club or organization: No    Attends meetings of clubs or organizations: Never    Relationship status: Widowed  Other Topics Concern  . Not on file  Social History Narrative    10/11/2018: Lives alone in apartment.    Has two sons, one in [place] and one in [place], only existing family. Has considered moving closer to sons but likes [place] too much.   Neighbors good social support   Enjoys watching soap operas, walking when she feels well, but recently struggling with more frequent episodes of pancreatitis.   Keeps positive attitude, "one day at a time"    Outpatient Encounter Medications as of 10/11/2018  Medication Sig  . cetirizine (ZYRTEC) 10 MG tablet Take 10 mg by mouth daily as needed for allergies.   . famotidine (PEPCID) 20 MG tablet Take 20 mg by mouth daily.   . fluconazole (DIFLUCAN) 100 MG tablet Take 1 tablet (100 mg total) by mouth as needed.  Marland Kitchen HYDROcodone-acetaminophen (NORCO/VICODIN) 5-325 MG tablet Take 1 tablet by mouth every 6 (six) hours as needed for moderate pain.  Marland Kitchen losartan (COZAAR) 100 MG tablet Take 1 tablet (100 mg total) by mouth daily.  . ondansetron (ZOFRAN-ODT) 4 MG disintegrating tablet Take 1 tablet (4 mg total) by mouth every 8 (eight) hours as needed.  . sertraline (ZOLOFT) 100 MG tablet Take 1 tablet (100 mg total) by mouth daily.  Marland Kitchen zolpidem (AMBIEN) 5 MG tablet Take 1 tablet (5 mg total) by mouth at bedtime as needed for sleep.   No facility-administered encounter medications on file as of 10/11/2018.     Activities of Daily Living In your present state of health, do you have any difficulty performing the following activities: 10/11/2018 08/09/2018  Hearing? N N  Vision? N N  Difficulty concentrating or making decisions? N N  Walking or climbing stairs? N N  Dressing or bathing? N N  Doing errands, shopping? N N  Preparing Food and eating ? N -  Using the Toilet? N -  In the past six months, have you accidently leaked urine? Y -  Comment minimal d/t prolapsed bladder. Seeing urologist soon -  Do you have problems with loss of bowel control? N -  Managing your Medications? N -  Managing your Finances? N -  Housekeeping or managing your  Housekeeping? N -  Comment has housekeeper that comes monthly -  Some recent data might be hidden    Patient Care Team: [name], MD as PCP - General Milus Banister, MD as Attending Physician (Gastroenterology) Katy Apo, MD as Consulting Physician (Ophthalmology)    Assessment:   This is a routine  wellness examination for [facility]. Physical assessment deferred to PCP.   Exercise Activities and Dietary recommendations Current Exercise Habits: The patient does not participate in regular exercise at present, Exercise limited by: cardiac condition(s) Walks occasionally.  Diet (meal preparation, eat out, water intake, caffeinated beverages, dairy products, fruits and vegetables): Eats what she can tolerate, usually [place] sandwiches, soup. Sees specialists for her chronic pancreatitis, is part of study. Has not been given any specific diet recommendations per pt. Stress reduction discussed to prevent possible flare-ups of pancreatitis.     Goals    . patient     Will continue on your journey   May want to check out Dr. Dennard Nip at the [facility] [phone] and is located at [facility]     . Patient Stated     Maintain independence, follow up with urology. "Take one day at a time"       Fall Risk Fall Risk  10/11/2018 08/10/2017 08/07/2016 07/22/2015 06/16/2014  Falls in the past year? 0 No No No No    Depression Screen PHQ 2/9 Scores 10/11/2018 08/10/2017 08/07/2016 07/22/2015  PHQ - 2 Score 0 0 0 0  PHQ- 9 Score 2 - - -     Cognitive Function       Ad8 score reviewed for issues:  Issues making decisions: no  Less interest in hobbies / activities: no  Repeats questions, stories (family complaining): no  Trouble using ordinary gadgets (microwave, computer, phone):no  Forgets the month or year: no  Mismanaging finances: no  Remembering appts: no  Daily problems with thinking and/or memory: no Ad8 score is= 0    Immunization History    Administered Date(s) Administered  . Influenza Split 06/28/2011, 07/10/2012  . Influenza, High Dose Seasonal PF 07/22/2015, 06/30/2016, 06/19/2017, 06/25/2018  . Influenza,inj,Quad PF,6+ Mos 07/16/2013, 06/16/2014  . Pneumococcal Conjugate-13 07/20/2014  . Pneumococcal Polysaccharide-23 07/22/2015  . Td 04/01/2010  . Zoster 08/14/2011     Screening Tests Health Maintenance  Topic Date Due  . COLONOSCOPY  11/03/2019  . TETANUS/TDAP  04/01/2020  . MAMMOGRAM  06/19/2020  . INFLUENZA VACCINE  Completed  . DEXA SCAN  Completed  . Hepatitis C Screening  Completed  . PNA vac Low Risk Adult  Completed         Plan:     Bring a copy of your living will and/or healthcare power of attorney to your next office visit.  Follow up with urology as planned.  It was great meeting you today, I appreciate you staying, and I wish you all the best!  I have personally reviewed and noted the following in the patient's chart:   . Medical and social history . Use of alcohol, tobacco or illicit drugs  . Current medications and supplements . Functional ability and status . Nutritional status . Physical activity . Advanced directives . List of other physicians . Hospitalizations, surgeries, and ER visits in previous 12 months . Vitals . Screenings to include cognitive, depression, and falls . Referrals and appointments  In addition, I have reviewed and discussed with patient certain preventive protocols, quality metrics, and best practice recommendations. A written personalized care plan for preventive services as well as general preventive health recommendations were provided to patient.     Alphia Moh, RN  10/11/2018  I have reviewed the documentation for the AWV and [place] provided by the health coach and agree with their documentation. I was immediately available for any questions  Bruce  Dan Europe MD [facility] at [facility]

## 2018-10-18 ENCOUNTER — Encounter: Payer: Self-pay | Admitting: Gastroenterology

## 2018-10-18 ENCOUNTER — Ambulatory Visit (INDEPENDENT_AMBULATORY_CARE_PROVIDER_SITE_OTHER): Payer: Medicare Other | Admitting: Gastroenterology

## 2018-10-18 VITALS — BP 118/72 | HR 84 | Ht 61.25 in | Wt 203.2 lb

## 2018-10-18 DIAGNOSIS — K859 Acute pancreatitis without necrosis or infection, unspecified: Secondary | ICD-10-CM | POA: Diagnosis not present

## 2018-10-18 NOTE — Patient Instructions (Signed)
Thank you for entrusting me with your care and choosing [facility].  Dr Ardis Hughs

## 2018-10-18 NOTE — Progress Notes (Signed)
HPI: This is a  Very pleasant 68 yo woman  Chief complaint is recurrent acute pancreatitis  I last saw her a little over 4 years ago here in the office.  She had been having multiple recurrent episodes of pancreatitis.  By that time she had had 6 total episodes.  She had undergone at least one ERCP in [place].  She had her gallbladder removed very remotely around 2001.  She has intermittently been told that she had pancreatic divisum.  I believe that she had had a biliary sphincterotomy during the ERCP in [place].  We had a nice discussion about her issue and eventually decided to refer her to [facility] Dr. Zenia Resides.  He did EUS and ERCP 2016, proceed with 'precut' pancreatic duct sphincterotomy at the major papilla but could still not cannulate the pancreatic duct.  Old Data Reviewed: She recently sent me records from [place] January 2020 ERCP by Dr. Daisy Lazar.  She underwent a battery of tests at [facility] including MRI, labs and she tells me he was unsure why she was having recurrent pancreatitis and told her that her options were doing nothing, having a Whipple surgery or ERCP.  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  Prior biliary sphincterotomy appeared open.  The cholangiogram was normal.  The patient had had a cholecystectomy.  The biliary tree was swept and nothing was found.  He was unable to cannulate her pancreatic ducts  I did a colonoscopy for her March 2016 for routine risk colon cancer screening.  I found a single subcentimeter polyp.  It was adenomatous and I recommended a repeat colonoscopy at 5-year interval.   Review of systems: Pertinent positive and negative review of systems were noted in the above HPI section. All other review negative.   Past Medical History:  Diagnosis Date  . Abnormal uterine bleeding   . Amenorrhea   . Anxiety   . Depression   . Gallstones   . Hay fever   . HTN  (hypertension)   . Pancreatitis   . Urine incontinence   . UTI (urinary tract infection)    reoccuring     Past Surgical History:  Procedure Laterality Date  . ABDOMINAL HYSTERECTOMY  1984   fibroids  . APPENDECTOMY  1984  . CHOLECYSTECTOMY  09/08/1999  . ERCP W/ SPHINCTEROTOMY AND BALLOON DILATION  08/2008  . OOPHORECTOMY     Only 1 was taken  . TONSILLECTOMY    . TUBAL LIGATION      Current Outpatient Medications  Medication Sig Dispense Refill  . cetirizine (ZYRTEC) 10 MG tablet Take 10 mg by mouth daily as needed for allergies.     . famotidine (PEPCID) 20 MG tablet Take 20 mg by mouth daily.     . fluconazole (DIFLUCAN) 100 MG tablet Take 1 tablet (100 mg total) by mouth as needed. 1 tablet 0  . HYDROcodone-acetaminophen (NORCO/VICODIN) 5-325 MG tablet Take 1 tablet by mouth every 6 (six) hours as needed for moderate pain. 20 tablet 0  . losartan (COZAAR) 100 MG tablet Take 1 tablet (100 mg total) by mouth daily. 90 tablet 3  . ondansetron (ZOFRAN-ODT) 4 MG disintegrating tablet Take 1 tablet (4 mg total) by mouth every 8 (eight) hours as needed. 90 tablet 1  . sertraline (ZOLOFT) 100 MG tablet Take 1 tablet (100 mg total) by mouth daily. 90 tablet 3  . zolpidem (AMBIEN) 5 MG tablet  Take 1 tablet (5 mg total) by mouth at bedtime as needed for sleep. 30 tablet 1   No current facility-administered medications for this visit.     Allergies as of 10/18/2018 - Review Complete 10/18/2018  Allergen Reaction Noted  . Other Hives and Swelling 10/04/2016  . Buprenorphine hcl  03/20/2016  . Codeine  12/06/2010  . Morphine and related  12/06/2010    Family History  Problem Relation Age of Onset  . Alcohol abuse Mother   . Prostate cancer Father   . Stroke Maternal Aunt   . Hiatal hernia Maternal Aunt   . Prostate cancer Paternal Grandfather   . Cerebral palsy Granddaughter   . Breast cancer Neg Hx     Social History   Socioeconomic History  . Marital status: Widowed     Spouse name: Not on file  . Number of children: 2  . Years of education: Not on file  . Highest education level: Not on file  Occupational History  . Occupation: retired  Scientific laboratory technician  . Financial resource strain: Not hard at all  . Food insecurity:    Worry: Never true    Inability: Never true  . Transportation needs:    Medical: No    Non-medical: No  Tobacco Use  . Smoking status: Never Smoker  . Smokeless tobacco: Never Used  Substance and Sexual Activity  . Alcohol use: Yes    Alcohol/week: 0.0 standard drinks    Comment: occ - hardly every   . Drug use: No  . Sexual activity: Not Currently    Partners: Male    Birth control/protection: Surgical  Lifestyle  . Physical activity:    Days per week: 0 days    Minutes per session: 0 min  . Stress: Only a little  Relationships  . Social connections:    Talks on phone: Twice a week    Gets together: Once a week    Attends religious service: Never    Active member of club or organization: No    Attends meetings of clubs or organizations: Never    Relationship status: Widowed  . Intimate partner violence:    Fear of current or ex partner: Not on file    Emotionally abused: Not on file    Physically abused: Not on file    Forced sexual activity: Not on file  Other Topics Concern  . Not on file  Social History Narrative   10/11/2018: Lives alone in apartment.    Has two sons, one in [place] and one in [place], only existing family. Has considered moving closer to sons but likes [place] too much.   Neighbors good social support   Enjoys watching soap operas, walking when she feels well, but recently struggling with more frequent episodes of pancreatitis.   Keeps positive attitude, "one day at a time"     Physical Exam: BP 118/72 (BP Location: Left Arm, Patient Position: Sitting, Cuff Size: Normal)   Pulse 84   Ht 5' 1.25" (1.556 m)   Wt 203 lb 4 oz (92.2 kg)   BMI 38.09 kg/m  Constitutional: generally  well-appearing Psychiatric: alert and oriented x3 Eyes: extraocular movements intact Mouth: oral pharynx moist, no lesions Neck: supple no lymphadenopathy Cardiovascular: heart regular rate and rhythm Lungs: clear to auscultation bilaterally Abdomen: soft, nontender, nondistended, no obvious ascites, no peritoneal signs, normal bowel sounds Extremities: no lower extremity edema bilaterally Skin: no lesions on visible extremities   Assessment and plan: 68 y.o. female  with recurrent acute pancreatitis  She has had multiple episodes over many years.  Unclear etiology (non-drinker, GB removed years ago and no evidence of retained stones/sludge). She has been evaluated [facility] with Dr. Zenia Resides and at [facility] more recently Dr. Daisy Lazar.  She has undergone ERCP Dr. Lysle Rubens 03/2015 and ERCP Dr. Deirdre Pippins 09/2018 ([place]).  Lysle Rubens ERCP in 2016 completed 'precut' pancreatic duct sphincterotomy but was still unable to cannulate the PD.  Likewise [place] ERCP in 2020 was also unable to cannulate the pancreatic duct.  Will follow her clinically. She plans to follow up at [facility] over the summer as well.  She was told at [facility] that a Whipple surgery could be entertained but she is not interested in that.  She knows to call here if she feels she has pancreatitis again.    Please see the "Patient Instructions" section for addition details about the plan.   Owens Loffler, MD [facility] 10/18/2018, 9:59 AM  Cc: [name], MD

## 2018-11-05 DIAGNOSIS — R350 Frequency of micturition: Secondary | ICD-10-CM | POA: Diagnosis not present

## 2018-11-05 DIAGNOSIS — R351 Nocturia: Secondary | ICD-10-CM | POA: Diagnosis not present

## 2018-11-05 DIAGNOSIS — N3946 Mixed incontinence: Secondary | ICD-10-CM | POA: Diagnosis not present

## 2018-11-15 DIAGNOSIS — R350 Frequency of micturition: Secondary | ICD-10-CM | POA: Diagnosis not present

## 2018-11-15 DIAGNOSIS — R351 Nocturia: Secondary | ICD-10-CM | POA: Diagnosis not present

## 2018-11-15 DIAGNOSIS — N3946 Mixed incontinence: Secondary | ICD-10-CM | POA: Diagnosis not present

## 2018-11-19 ENCOUNTER — Other Ambulatory Visit: Payer: Self-pay

## 2018-11-19 ENCOUNTER — Inpatient Hospital Stay (HOSPITAL_COMMUNITY)
Admission: EM | Admit: 2018-11-19 | Discharge: 2018-11-25 | DRG: 440 | Disposition: A | Discharge status: Home Health | Payer: Medicare Other | Attending: Internal Medicine | Admitting: Internal Medicine

## 2018-11-19 ENCOUNTER — Encounter (HOSPITAL_COMMUNITY): Payer: Self-pay

## 2018-11-19 DIAGNOSIS — Z8042 Family history of malignant neoplasm of prostate: Secondary | ICD-10-CM

## 2018-11-19 DIAGNOSIS — K859 Acute pancreatitis without necrosis or infection, unspecified: Secondary | ICD-10-CM | POA: Diagnosis present

## 2018-11-19 DIAGNOSIS — R1013 Epigastric pain: Secondary | ICD-10-CM | POA: Diagnosis not present

## 2018-11-19 DIAGNOSIS — I959 Hypotension, unspecified: Secondary | ICD-10-CM | POA: Diagnosis not present

## 2018-11-19 DIAGNOSIS — Z823 Family history of stroke: Secondary | ICD-10-CM | POA: Diagnosis not present

## 2018-11-19 DIAGNOSIS — Z885 Allergy status to narcotic agent status: Secondary | ICD-10-CM | POA: Diagnosis not present

## 2018-11-19 DIAGNOSIS — F5104 Psychophysiologic insomnia: Secondary | ICD-10-CM | POA: Diagnosis present

## 2018-11-19 DIAGNOSIS — F4323 Adjustment disorder with mixed anxiety and depressed mood: Secondary | ICD-10-CM | POA: Diagnosis present

## 2018-11-19 DIAGNOSIS — F339 Major depressive disorder, recurrent, unspecified: Secondary | ICD-10-CM | POA: Diagnosis present

## 2018-11-19 DIAGNOSIS — T502X5A Adverse effect of carbonic-anhydrase inhibitors, benzothiadiazides and other diuretics, initial encounter: Secondary | ICD-10-CM | POA: Diagnosis not present

## 2018-11-19 DIAGNOSIS — R52 Pain, unspecified: Secondary | ICD-10-CM

## 2018-11-19 DIAGNOSIS — I10 Essential (primary) hypertension: Secondary | ICD-10-CM | POA: Diagnosis present

## 2018-11-19 DIAGNOSIS — Z85828 Personal history of other malignant neoplasm of skin: Secondary | ICD-10-CM

## 2018-11-19 DIAGNOSIS — E876 Hypokalemia: Secondary | ICD-10-CM | POA: Diagnosis not present

## 2018-11-19 DIAGNOSIS — E669 Obesity, unspecified: Secondary | ICD-10-CM

## 2018-11-19 DIAGNOSIS — Z888 Allergy status to other drugs, medicaments and biological substances status: Secondary | ICD-10-CM | POA: Diagnosis not present

## 2018-11-19 DIAGNOSIS — F419 Anxiety disorder, unspecified: Secondary | ICD-10-CM | POA: Diagnosis present

## 2018-11-19 DIAGNOSIS — Z9049 Acquired absence of other specified parts of digestive tract: Secondary | ICD-10-CM | POA: Diagnosis not present

## 2018-11-19 DIAGNOSIS — K861 Other chronic pancreatitis: Secondary | ICD-10-CM | POA: Diagnosis present

## 2018-11-19 DIAGNOSIS — R109 Unspecified abdominal pain: Secondary | ICD-10-CM | POA: Diagnosis not present

## 2018-11-19 DIAGNOSIS — R001 Bradycardia, unspecified: Secondary | ICD-10-CM | POA: Diagnosis present

## 2018-11-19 DIAGNOSIS — Z811 Family history of alcohol abuse and dependence: Secondary | ICD-10-CM

## 2018-11-19 DIAGNOSIS — K219 Gastro-esophageal reflux disease without esophagitis: Secondary | ICD-10-CM | POA: Diagnosis present

## 2018-11-19 DIAGNOSIS — D049 Carcinoma in situ of skin, unspecified: Secondary | ICD-10-CM | POA: Diagnosis present

## 2018-11-19 DIAGNOSIS — R1084 Generalized abdominal pain: Secondary | ICD-10-CM | POA: Diagnosis not present

## 2018-11-19 LAB — COMPREHENSIVE METABOLIC PANEL
ALT: 22 U/L (ref 0–44)
AST: 23 U/L (ref 15–41)
Albumin: 4.3 g/dL (ref 3.5–5.0)
Alkaline Phosphatase: 89 U/L (ref 38–126)
Anion gap: 5 (ref 5–15)
BUN: 16 mg/dL (ref 8–23)
CO2: 27 mmol/L (ref 22–32)
Calcium: 8.9 mg/dL (ref 8.9–10.3)
Chloride: 110 mmol/L (ref 98–111)
Creatinine, Ser: 0.72 mg/dL (ref 0.44–1.00)
GFR calc Af Amer: >60 mL/min (ref >60)
GFR calc non Af Amer: >60 mL/min (ref >60)
Glucose, Bld: 115 mg/dL — ABNORMAL HIGH (ref 70–99)
Potassium: 3.9 mmol/L (ref 3.5–5.1)
SODIUM: 142 mmol/L (ref 135–145)
Total Bilirubin: 0.6 mg/dL (ref 0.3–1.2)
Total Protein: 7.6 g/dL (ref 6.5–8.1)

## 2018-11-19 LAB — CBC
HCT: 42.9 % (ref 36.0–46.0)
Hemoglobin: 12.9 g/dL (ref 12.0–15.0)
MCH: 29.1 pg (ref 26.0–34.0)
MCHC: 30.1 g/dL (ref 30.0–36.0)
MCV: 96.8 fL (ref 80.0–100.0)
Platelets: 208 10*3/uL (ref 150–400)
RBC: 4.43 MIL/uL (ref 3.87–5.11)
RDW: 13.2 % (ref 11.5–15.5)
WBC: 8.9 10*3/uL (ref 4.0–10.5)
nRBC: 0 % (ref 0.0–0.2)

## 2018-11-19 LAB — CBC WITH DIFFERENTIAL/PLATELET
Abs Immature Granulocytes: 0.05 10*3/uL (ref 0.00–0.07)
Basophils Absolute: 0.1 10*3/uL (ref 0.0–0.1)
Basophils Relative: 1 %
Eosinophils Absolute: 0.1 10*3/uL (ref 0.0–0.5)
Eosinophils Relative: 1 %
HCT: 48.2 % — ABNORMAL HIGH (ref 36.0–46.0)
Hemoglobin: 14.5 g/dL (ref 12.0–15.0)
Immature Granulocytes: 1 %
Lymphocytes Relative: 14 %
Lymphs Abs: 1.5 10*3/uL (ref 0.7–4.0)
MCH: 28.7 pg (ref 26.0–34.0)
MCHC: 30.1 g/dL (ref 30.0–36.0)
MCV: 95.4 fL (ref 80.0–100.0)
Monocytes Absolute: 0.6 10*3/uL (ref 0.1–1.0)
Monocytes Relative: 6 %
NEUTROS PCT: 77 %
Neutro Abs: 8.8 10*3/uL — ABNORMAL HIGH (ref 1.7–7.7)
Platelets: 235 10*3/uL (ref 150–400)
RBC: 5.05 MIL/uL (ref 3.87–5.11)
RDW: 13.2 % (ref 11.5–15.5)
WBC: 11 10*3/uL — ABNORMAL HIGH (ref 4.0–10.5)
nRBC: 0 % (ref 0.0–0.2)

## 2018-11-19 LAB — LIPASE, BLOOD: Lipase: 3337 U/L — ABNORMAL HIGH (ref 11–51)

## 2018-11-19 LAB — PHOSPHORUS: PHOSPHORUS: 2.5 mg/dL (ref 2.5–4.6)

## 2018-11-19 LAB — CREATININE, SERUM
Creatinine, Ser: 0.64 mg/dL (ref 0.44–1.00)
GFR calc non Af Amer: >60 mL/min (ref >60)

## 2018-11-19 LAB — MAGNESIUM: Magnesium: 2 mg/dL (ref 1.7–2.4)

## 2018-11-19 MED ORDER — HYDROMORPHONE HCL 1 MG/ML IJ SOLN
1.0000 mg | INTRAMUSCULAR | Status: DC | PRN
  Administered 2018-11-19: 2 mg via INTRAVENOUS
  Administered 2018-11-19 (×2): 1 mg via INTRAVENOUS
  Administered 2018-11-20 – 2018-11-21 (×4): 2 mg via INTRAVENOUS
  Filled 2018-11-19 (×2): qty 2
  Filled 2018-11-19: qty 1
  Filled 2018-11-19 (×2): qty 2
  Filled 2018-11-19: qty 1
  Filled 2018-11-19: qty 2

## 2018-11-19 MED ORDER — ENOXAPARIN SODIUM 40 MG/0.4ML ~~LOC~~ SOLN
40.0000 mg | SUBCUTANEOUS | Status: DC
  Administered 2018-11-19 – 2018-11-24 (×6): 40 mg via SUBCUTANEOUS
  Filled 2018-11-19 (×6): qty 0.4

## 2018-11-19 MED ORDER — ONDANSETRON HCL 4 MG/2ML IJ SOLN
4.0000 mg | Freq: Four times a day (QID) | INTRAMUSCULAR | Status: DC | PRN
  Administered 2018-11-19 – 2018-11-24 (×9): 4 mg via INTRAVENOUS
  Filled 2018-11-19 (×9): qty 2

## 2018-11-19 MED ORDER — SODIUM CHLORIDE 0.9 % IV BOLUS
1000.0000 mL | Freq: Once | INTRAVENOUS | Status: AC
  Administered 2018-11-19: 1000 mL via INTRAVENOUS

## 2018-11-19 MED ORDER — LORAZEPAM BOLUS VIA INFUSION: 0.5000 mg | INTRAVENOUS | Status: DC | PRN

## 2018-11-19 MED ORDER — DIPHENHYDRAMINE HCL 50 MG/ML IJ SOLN
12.5000 mg | Freq: Four times a day (QID) | INTRAMUSCULAR | Status: DC | PRN
  Administered 2018-11-19 – 2018-11-21 (×7): 12.5 mg via INTRAVENOUS
  Filled 2018-11-19 (×7): qty 1

## 2018-11-19 MED ORDER — HYDROMORPHONE HCL 1 MG/ML IJ SOLN: 1.0000 mg | INTRAMUSCULAR | Status: DC | PRN

## 2018-11-19 MED ORDER — HYDROMORPHONE HCL 1 MG/ML IJ SOLN
1.0000 mg | Freq: Once | INTRAMUSCULAR | Status: AC
  Administered 2018-11-19: 1 mg via INTRAVENOUS
  Filled 2018-11-19: qty 1

## 2018-11-19 MED ORDER — SODIUM CHLORIDE 0.9 % IV SOLN
INTRAVENOUS | Status: DC
  Administered 2018-11-19 – 2018-11-22 (×8): via INTRAVENOUS

## 2018-11-19 MED ORDER — ONDANSETRON HCL 4 MG/2ML IJ SOLN
4.0000 mg | Freq: Once | INTRAMUSCULAR | Status: AC
  Administered 2018-11-19: 4 mg via INTRAVENOUS
  Filled 2018-11-19: qty 2

## 2018-11-19 MED ORDER — LORAZEPAM 2 MG/ML IJ SOLN
0.5000 mg | INTRAMUSCULAR | Status: DC | PRN
  Administered 2018-11-20 – 2018-11-25 (×7): 1 mg via INTRAVENOUS
  Filled 2018-11-19 (×8): qty 1

## 2018-11-19 NOTE — ED Notes (Signed)
ED Provider at bedside. 

## 2018-11-19 NOTE — ED Provider Notes (Signed)
[facility] DEPT Provider Note   CSN: [id number] Arrival date & time: 11/19/18  1007    History   Chief Complaint Chief Complaint  Patient presents with  . Abdominal Pain    HPI Linda Blackburn is a 68 y.o. female.     HPI  68 year old female with known history of idiopathic pancreatitis status post cholecystectomy and ERCP comes in with chief complaint of abdominal pain. Patient reports that she started having this pain around 7 AM this morning the pain woke her up from her sleep.  Pain is located in the epigastric region and radiates down towards the lower part of her abdomen.  The pain is typical of her pancreatitis type pain and she has had bilious emesis associated with it.  Patient reports that typically her pain is located in the epigastric region only and does not radiate down towards the umbilicus.  She has no known history of gastritis and she denies any heavy NSAID use, alcohol use or smoking.  Patient additionally reports that she has had intermittent flareups every 4 to 6 months over the past year.  There is no specific evoking factor, however when patient does get pancreatitis her pain is severe and she typically requires admission for few days until better.  She is well aware of the covert 19 pandemic, but her pain was so severe that she had to come to the ED for further treatment.   Past Medical History:  Diagnosis Date  . Abnormal uterine bleeding   . Amenorrhea   . Anxiety   . Depression   . Gallstones   . Hay fever   . HTN (hypertension)   . Pancreatitis   . Urine incontinence   . UTI (urinary tract infection)    reoccuring     Patient Active Problem List   Diagnosis Date Noted  . Basal cell carcinoma (BCC) in situ of skin 10/11/2018  . Stress incontinence 10/11/2018  . Hypotension (arterial) 08/09/2018  . Sinus bradycardia on ECG 08/09/2018  . AKI (acute kidney injury) ([place]) 08/09/2018  . Recurrent acute pancreatitis  08/08/2018  . Nausea & vomiting 03/07/2018  . Sepsis ([place]) 01/01/2018  . Alternating constipation and diarrhea 09/26/2016  . Obesity (BMI 30-39.9) 07/20/2014  . Chronic insomnia 06/16/2014  . Hypokalemia 06/10/2014  . Volume overload 06/10/2014  . UTI (urinary tract infection) 06/10/2014  . Leukocytosis 06/10/2014  . Acute respiratory failure with hypoxia ([place]) 06/10/2014  . Acute recurrent pancreatitis 06/06/2014  . Pancreatitis 06/06/2014  . Hypertension 09/11/2012  . Adjustment disorder with mixed anxiety and depressed mood 09/11/2012  . Pancreas divisum 01/18/2011  . URI 09/02/2010  . Depression, recurrent ([place]) 04/01/2010  . PANCREATITIS, HX OF 04/01/2010    Past Surgical History:  Procedure Laterality Date  . ABDOMINAL HYSTERECTOMY  1984   fibroids  . APPENDECTOMY  1984  . CHOLECYSTECTOMY  09/08/1999  . ERCP W/ SPHINCTEROTOMY AND BALLOON DILATION  08/2008  . OOPHORECTOMY     Only 1 was taken  . TONSILLECTOMY    . TUBAL LIGATION       OB History    Gravida  4   Para  2   Term  2   Preterm      AB  2   Living  2     SAB      TAB      Ectopic      Multiple      Live Births  2  Home Medications    Prior to Admission medications   Medication Sig Start Date End Date Taking? Authorizing Provider  cetirizine (ZYRTEC) 10 MG tablet Take 10 mg by mouth daily as needed for allergies.    Yes [provider]  famotidine (PEPCID) 20 MG tablet Take 20 mg by mouth daily.    Yes [provider]  fluconazole (DIFLUCAN) 100 MG tablet Take 1 tablet (100 mg total) by mouth as needed. 08/19/18  Yes Burchette, Alinda Sierras, MD  losartan (COZAAR) 100 MG tablet Take 1 tablet (100 mg total) by mouth daily. 08/19/18  Yes Burchette, Alinda Sierras, MD  naproxen sodium (ALEVE) 220 MG tablet Take 440 mg by mouth daily as needed (pain).   Yes [provider]  ondansetron (ZOFRAN-ODT) 4 MG disintegrating tablet Take 1 tablet (4 mg total) by mouth  every 8 (eight) hours as needed. 08/19/18  Yes Burchette, Alinda Sierras, MD  sertraline (ZOLOFT) 100 MG tablet Take 1 tablet (100 mg total) by mouth daily. 08/19/18  Yes Burchette, Alinda Sierras, MD  zolpidem (AMBIEN) 5 MG tablet Take 1 tablet (5 mg total) by mouth at bedtime as needed for sleep. 08/07/16  Yes Burchette, Alinda Sierras, MD  HYDROcodone-acetaminophen (NORCO/VICODIN) 5-325 MG tablet Take 1 tablet by mouth every 6 (six) hours as needed for moderate pain. Patient not taking: Reported on 11/19/2018 08/12/18   Thurnell Lose, MD    Family History Family History  Problem Relation Age of Onset  . Alcohol abuse Mother   . Prostate cancer Father   . Stroke Maternal Aunt   . Hiatal hernia Maternal Aunt   . Prostate cancer Paternal Grandfather   . Cerebral palsy Granddaughter   . Breast cancer Neg Hx     Social History Social History   Tobacco Use  . Smoking status: Never Smoker  . Smokeless tobacco: Never Used  Substance Use Topics  . Alcohol use: Yes    Alcohol/week: 0.0 standard drinks    Comment: occ - hardly every   . Drug use: No     Allergies   Other; Buprenorphine hcl; Codeine; and Morphine and related   Review of Systems Review of Systems  Constitutional: Positive for activity change.  Gastrointestinal: Positive for abdominal pain, nausea and vomiting.  Genitourinary: Negative for dysuria.  All other systems reviewed and are negative.    Physical Exam Updated Vital Signs BP (!) 116/59   Pulse (!) 54   Temp 97.7 F (36.5 C) (Oral)   Resp 14   Ht 5\' 2"  (1.575 m)   Wt 90.7 kg   SpO2 98%   BMI 36.58 kg/m   Physical Exam Vitals signs and nursing note reviewed.  Constitutional:      Appearance: She is well-developed.  HENT:     Head: Normocephalic and atraumatic.  Neck:     Musculoskeletal: Normal range of motion and neck supple.  Cardiovascular:     Rate and Rhythm: Normal rate.  Pulmonary:     Effort: Pulmonary effort is normal.  Abdominal:     General:  Bowel sounds are normal.     Tenderness: There is abdominal tenderness in the epigastric area and periumbilical area. There is guarding. There is no rebound.  Skin:    General: Skin is warm and dry.  Neurological:     Mental Status: She is alert and oriented to person, place, and time.      ED Treatments / Results  Labs (all labs ordered are listed, but only  abnormal results are displayed) Labs Reviewed  COMPREHENSIVE METABOLIC PANEL - Abnormal; Notable for the following components:      Result Value   Glucose, Bld 115 (*)    All other components within normal limits  LIPASE, BLOOD - Abnormal; Notable for the following components:   Lipase 3,337 (*)    All other components within normal limits  CBC WITH DIFFERENTIAL/PLATELET - Abnormal; Notable for the following components:   WBC 11.0 (*)    HCT 48.2 (*)    Neutro Abs 8.8 (*)    All other components within normal limits  URINALYSIS, ROUTINE W REFLEX MICROSCOPIC    EKG None  Radiology No results found.  Procedures Procedures (including critical care time)  Medications Ordered in ED Medications  HYDROmorphone (DILAUDID) injection 1 mg (has no administration in time range)  sodium chloride 0.9 % bolus 1,000 mL (has no administration in time range)  sodium chloride 0.9 % bolus 1,000 mL (1,000 mLs Intravenous New Bag/Given 11/19/18 1130)  HYDROmorphone (DILAUDID) injection 1 mg (1 mg Intravenous Given 11/19/18 1130)  ondansetron (ZOFRAN) injection 4 mg (4 mg Intravenous Given 11/19/18 1130)     Initial Impression / Assessment and Plan / ED Course  I have reviewed the triage vital signs and the nursing notes.  Pertinent labs & imaging results that were available during my care of the patient were reviewed by me and considered in my medical decision making (see chart for details).        68 year old female comes in a chief complaint of abdominal pain. She has history of idiopathic pancreatitis that is recurrent in  nature.  She has not had any complications from the pancreatitis itself.  Patient is status post cholecystectomy and has had ERCP in the past.   It appears that she gets these flareups every 4 to 6 months over the past couple of years.  There is no specific evoking factor.  This pain is typical in quality, however pain is radiating lower down than it normally does.  On exam she has significant tenderness with guarding that is generalized.  Patient does not have rebound tenderness at this time.  Suspicion is high for recurrent pancreatitis.  Clinical suspicion for surgical complication for this pancreatitis is low, as the symptoms that started this morning and there is no associated fevers, hypotension and patient does not appear toxic.  Reassessment: Upon reassessment patient states that her pain has improved.  Her vital signs have continued to be within normal limits.  White count is mildly elevated.  Lipase is over 3000.  Patient reports that her pain is improved but she still uncomfortable.  Another round of IV Dilaudid ordered.  At this time I do not think patient needs a CT emergently, as the possibility of infected pseudocyst or severe necrosis is low, however if patient does not get better or gets worse then she might need CT scan.  I have reviewed CAT scan from December and April of last year.  We will consult medicine for admission.  Final Clinical Impressions(s) / ED Diagnoses   Final diagnoses:  Acute recurrent pancreatitis    ED Discharge Orders    None       Varney Biles, MD 11/19/18 1332

## 2018-11-19 NOTE — ED Notes (Signed)
ED TO INPATIENT HANDOFF REPORT  ED Nurse Name and Phone #: [phone] [facility] RN  S Name/Age/Gender Linda Blackburn 68 y.o. female Room/Bed: WA15/WA15  Code Status   Code Status: Full Code  Home/SNF/Other Home Patient oriented to: self, place, time and situation Is this baseline? Yes   Triage Complete: Triage complete  Chief Complaint Abd pain  Triage Note Per EMS-history of pancreatitis-states her flare ups are becoming more frequent-states she has a GI MD but has not followed up-50 mcg of Fentanyl in route-states hypotensive-500 mg NS Bolus given in route-   Allergies Allergies  Allergen Reactions  . Other Hives and Swelling    AQUASONIC [place] GEL  . Buprenorphine Hcl     "In a scarry movie" weird thoughts  . Codeine     GI upset  . Morphine And Related     "In a scarry movie" weird thoughts    Level of Care/Admitting Diagnosis ED Disposition    ED Disposition Condition [place]: [place] [[id number]]  Level of Care: Med-Surg [16]  Diagnosis: Pancreatitis [[id number]]  Admitting Physician: Allie Bossier [[id number]]  Attending Physician: Allie Bossier [[id number]]  Estimated length of stay: 3 - 4 days  Certification:: I certify this patient will need inpatient services for at least 2 midnights  PT Class (Do Not Modify): Inpatient [101]  PT Acc Code (Do Not Modify): Private [1]       B Medical/Surgery History Past Medical History:  Diagnosis Date  . Abnormal uterine bleeding   . Amenorrhea   . Anxiety   . Depression   . Gallstones   . Hay fever   . HTN (hypertension)   . Pancreatitis   . Urine incontinence   . UTI (urinary tract infection)    reoccuring    Past Surgical History:  Procedure Laterality Date  . ABDOMINAL HYSTERECTOMY  1984   fibroids  . APPENDECTOMY  1984  . CHOLECYSTECTOMY  09/08/1999  . ERCP W/ SPHINCTEROTOMY AND BALLOON DILATION  08/2008  . OOPHORECTOMY     Only 1 was taken  . TONSILLECTOMY     . TUBAL LIGATION       A IV Location/Drains/Wounds Patient Lines/Drains/Airways Status   Active Line/Drains/Airways    Name:   Placement date:   Placement time:   Site:   Days:   Peripheral IV 11/19/18 Right Antecubital   11/19/18    1129    Antecubital   less than 1          Intake/Output Last 24 hours  Intake/Output Summary (Last 24 hours) at 11/19/2018 1511 Last data filed at 11/19/2018 1339 Gross per 24 hour  Intake 1000 ml  Output -  Net 1000 ml    Labs/Imaging Results for orders placed or performed during the hospital encounter of 11/19/18 (from the past 48 hour(s))  Comprehensive metabolic panel     Status: Abnormal   Collection Time: 11/19/18 11:31 AM  Result Value Ref Range   Sodium 142 135 - 145 mmol/L   Potassium 3.9 3.5 - 5.1 mmol/L   Chloride 110 98 - 111 mmol/L   CO2 27 22 - 32 mmol/L   Glucose, Bld 115 (H) 70 - 99 mg/dL   BUN 16 8 - 23 mg/dL   Creatinine, Ser 0.72 0.44 - 1.00 mg/dL   Calcium 8.9 8.9 - 10.3 mg/dL   Total Protein 7.6 6.5 - 8.1 g/dL   Albumin 4.3 3.5 - 5.0 g/dL  AST 23 15 - 41 U/L   ALT 22 0 - 44 U/L   Alkaline Phosphatase 89 38 - 126 U/L   Total Bilirubin 0.6 0.3 - 1.2 mg/dL   GFR calc non Af Amer >60 >60 mL/min   GFR calc Af Amer >60 >60 mL/min   Anion gap 5 5 - 15    Comment: Performed at [facility], [place] [address], [place], [place] [address]  Lipase, blood     Status: Abnormal   Collection Time: 11/19/18 11:31 AM  Result Value Ref Range   Lipase 3,337 (H) 11 - 51 U/L    Comment: RESULTS CONFIRMED BY MANUAL DILUTION Performed at [facility], [place] [address], [place], [place] [address]   CBC with Diff     Status: Abnormal   Collection Time: 11/19/18 11:31 AM  Result Value Ref Range   WBC 11.0 (H) 4.0 - 10.5 K/uL   RBC 5.05 3.87 - 5.11 MIL/uL   Hemoglobin 14.5 12.0 - 15.0 g/dL   HCT 48.2 (H) 36.0 - 46.0 %   MCV 95.4 80.0 - 100.0 fL   MCH 28.7 26.0 - 34.0 pg   MCHC 30.1 30.0 - 36.0  g/dL   RDW 13.2 11.5 - 15.5 %   Platelets 235 150 - 400 K/uL   nRBC 0.0 0.0 - 0.2 %   Neutrophils Relative % 77 %   Neutro Abs 8.8 (H) 1.7 - 7.7 K/uL   Lymphocytes Relative 14 %   Lymphs Abs 1.5 0.7 - 4.0 K/uL   Monocytes Relative 6 %   Monocytes Absolute 0.6 0.1 - 1.0 K/uL   Eosinophils Relative 1 %   Eosinophils Absolute 0.1 0.0 - 0.5 K/uL   Basophils Relative 1 %   Basophils Absolute 0.1 0.0 - 0.1 K/uL   Immature Granulocytes 1 %   Abs Immature Granulocytes 0.05 0.00 - 0.07 K/uL    Comment: Performed at [facility], [place] [address], [place],  [address]   No results found.  Pending Labs Unresulted Labs (From admission, onward)    Start     Ordered   11/26/18 0500  Creatinine, serum  (enoxaparin (LOVENOX)    CrCl >/= 30 ml/min)  Weekly,   R    Comments:  while on enoxaparin therapy    11/19/18 1354   11/20/18 3536  Basic metabolic panel  Daily,   R     11/19/18 1354   11/20/18 0500  CBC  Tomorrow morning,   R     11/19/18 1354   11/20/18 0500  Lipase, blood  Daily,   R     11/19/18 1354   11/19/18 1351  Magnesium  Once,   R     11/19/18 1354   11/19/18 1351  Phosphorus  Once,   R     11/19/18 1354   11/19/18 1349  CBC  (enoxaparin (LOVENOX)    CrCl >/= 30 ml/min)  Once,   R    Comments:  Baseline for enoxaparin therapy IF NOT ALREADY DRAWN.  Notify MD if PLT < 100 K.    11/19/18 1354   11/19/18 1349  Creatinine, serum  (enoxaparin (LOVENOX)    CrCl >/= 30 ml/min)  Once,   R    Comments:  Baseline for enoxaparin therapy IF NOT ALREADY DRAWN.    11/19/18 1354   11/19/18 1107  Urinalysis, Routine w reflex microscopic  ONCE - STAT,   STAT     11/19/18  1106          Vitals/Pain Today's Vitals   11/19/18 1300 11/19/18 1339 11/19/18 1457 11/19/18 1500  BP: (!) 116/59  (!) 113/55   Pulse: 61  (!) 55   Resp: 14  16 16   Temp:      TempSrc:      SpO2: 100%  96%   Weight:      Height:      PainSc:  8       Isolation Precautions No  active isolations  Medications Medications  enoxaparin (LOVENOX) injection 40 mg (has no administration in time range)  0.9 %  sodium chloride infusion (has no administration in time range)  ondansetron (ZOFRAN) injection 4 mg (has no administration in time range)  HYDROmorphone (DILAUDID) injection 1-2 mg (has no administration in time range)  sodium chloride 0.9 % bolus 1,000 mL (0 mLs Intravenous Stopped 11/19/18 1339)  HYDROmorphone (DILAUDID) injection 1 mg (1 mg Intravenous Given 11/19/18 1130)  ondansetron (ZOFRAN) injection 4 mg (4 mg Intravenous Given 11/19/18 1130)  HYDROmorphone (DILAUDID) injection 1 mg (1 mg Intravenous Given 11/19/18 1340)  sodium chloride 0.9 % bolus 1,000 mL (1,000 mLs Intravenous New Bag/Given 11/19/18 1340)    Mobility walks with person assist Low fall risk   Focused Assessments Pulmonary Assessment Handoff:  Lung sounds:   O2 Device: Room Air        R Recommendations: See Admitting Provider Note  Report given to:   Additional Notes:

## 2018-11-19 NOTE — ED Notes (Signed)
Patient provided with ice chips at this time.

## 2018-11-19 NOTE — ED Triage Notes (Signed)
Per EMS-history of pancreatitis-states her flare ups are becoming more frequent-states she has a GI MD but has not followed up-50 mcg of Fentanyl in route-states hypotensive-500 mg NS Bolus given in route-

## 2018-11-19 NOTE — H&P (Signed)
[facility] History and Physical  Linda Blackburn JIR:[id number] DOB: July 08, 1951 DOA: 11/19/2018  Referring physician:  PCP: [name], MD   Chief Complaint: Acute on chronic abdominal pain  HPI: Linda Blackburn is a 68 y.o. WF PMHx anxiety, depression, nephrolithiasis, HTN, pancreatitis. status post cholecystectomy and ERCP   comes in with chief complaint of abdominal pain. Patient reports that she started having this pain around 7 AM this morning the pain woke her up from her sleep.  Pain is located in the epigastric region and radiates down towards the lower part of her abdomen.  The pain is typical of her pancreatitis type pain and she has had bilious emesis associated with it.  Patient reports that typically her pain is located in the epigastric region only and does not radiate down towards the umbilicus.  She has no known history of gastritis and she denies any heavy NSAID use, alcohol use or smoking.  Patient additionally reports that she has had intermittent flareups every 4 to 6 months over the past year.  There is no specific evoking factor, however when patient does get pancreatitis her pain is severe and she typically requires admission for few days until better.  She is well aware of the covert 19 pandemic, but her pain was so severe that she had to come to the ED for further treatment.       Review of Systems:  Constitutional:  No weight loss, night sweats, Fevers, chills, fatigue.  HEENT:  No headaches, Difficulty swallowing,Tooth/dental problems,Sore throat,  No sneezing, itching, ear ache, nasal congestion, post nasal drip,  Cardio-vascular:  No chest pain, Orthopnea, PND, swelling in lower extremities, anasarca, dizziness, palpitations  GI:  No heartburn, indigestion, abdominal pain, nausea, vomiting, diarrhea, change in bowel habits, loss of appetite  Resp:  No shortness of breath with exertion or at rest. No excess mucus, no productive cough, No  non-productive cough, No coughing up of blood.No change in color of mucus.No wheezing.No chest wall deformity  Skin:  no rash or lesions.  GU:  no dysuria, change in color of urine, no urgency or frequency. No flank pain.  Musculoskeletal:  No joint pain or swelling. No decreased range of motion. No back pain.  Psych:  No change in mood or affect. No depression or anxiety. No memory loss.   Past Medical History:  Diagnosis Date  . Abnormal uterine bleeding   . Amenorrhea   . Anxiety   . Depression   . Gallstones   . Hay fever   . HTN (hypertension)   . Pancreatitis   . Urine incontinence   . UTI (urinary tract infection)    reoccuring    Past Surgical History:  Procedure Laterality Date  . ABDOMINAL HYSTERECTOMY  1984   fibroids  . APPENDECTOMY  1984  . CHOLECYSTECTOMY  09/08/1999  . ERCP W/ SPHINCTEROTOMY AND BALLOON DILATION  08/2008  . OOPHORECTOMY     Only 1 was taken  . TONSILLECTOMY    . TUBAL LIGATION     Social History:  reports that she has never smoked. She has never used smokeless tobacco. She reports current alcohol use. She reports that she does not use drugs.  Allergies  Allergen Reactions  . Other Hives and Swelling    AQUASONIC [place] GEL  . Buprenorphine Hcl     "In a scarry movie" weird thoughts  . Codeine     GI upset  . Morphine And Related     "In a scarry  movie" weird thoughts    Family History  Problem Relation Age of Onset  . Alcohol abuse Mother   . Prostate cancer Father   . Stroke Maternal Aunt   . Hiatal hernia Maternal Aunt   . Prostate cancer Paternal Grandfather   . Cerebral palsy Granddaughter   . Breast cancer Neg Hx     Prior to Admission medications   Medication Sig Start Date End Date Taking? Authorizing Provider  cetirizine (ZYRTEC) 10 MG tablet Take 10 mg by mouth daily as needed for allergies.    Yes [provider]  famotidine (PEPCID) 20 MG tablet Take 20 mg by mouth daily.    Yes [provider]   fluconazole (DIFLUCAN) 100 MG tablet Take 1 tablet (100 mg total) by mouth as needed. 08/19/18  Yes Burchette, Alinda Sierras, MD  losartan (COZAAR) 100 MG tablet Take 1 tablet (100 mg total) by mouth daily. 08/19/18  Yes Burchette, Alinda Sierras, MD  naproxen sodium (ALEVE) 220 MG tablet Take 440 mg by mouth daily as needed (pain).   Yes [provider]  ondansetron (ZOFRAN-ODT) 4 MG disintegrating tablet Take 1 tablet (4 mg total) by mouth every 8 (eight) hours as needed. 08/19/18  Yes Burchette, Alinda Sierras, MD  sertraline (ZOLOFT) 100 MG tablet Take 1 tablet (100 mg total) by mouth daily. 08/19/18  Yes Burchette, Alinda Sierras, MD  zolpidem (AMBIEN) 5 MG tablet Take 1 tablet (5 mg total) by mouth at bedtime as needed for sleep. 08/07/16  Yes Burchette, Alinda Sierras, MD  HYDROcodone-acetaminophen (NORCO/VICODIN) 5-325 MG tablet Take 1 tablet by mouth every 6 (six) hours as needed for moderate pain. Patient not taking: Reported on 11/19/2018 08/12/18   Thurnell Lose, MD     Consultants:  None  Procedures/Significant Events:  None  I have personally reviewed and interpreted all radiology studies and my findings are as above.   VENTILATOR SETTINGS: None   Cultures None  Antimicrobials: None   Devices    LINES / TUBES:  None    Continuous Infusions:  Physical Exam: Vitals:   11/19/18 1019 11/19/18 1300  BP: (!) 112/55 (!) 116/59  Pulse: (!) 54 (!) 54  Resp: 13 14  Temp: 97.7 F (36.5 C)   TempSrc: Oral   SpO2: 99% 98%  Weight: 90.7 kg   Height: 5\' 2"  (1.575 m)     Wt Readings from Last 3 Encounters:  11/19/18 90.7 kg  10/18/18 92.2 kg  10/11/18 90.7 kg    General: No acute respiratory distress Eyes: negative scleral hemorrhage, negative anisocoria, negative icterus ENT: Negative Runny nose, negative gingival bleeding, Neck:  Negative scars, masses, torticollis, lymphadenopathy, JVD Lungs: Clear to auscultation bilaterally without wheezes or crackles Cardiovascular:  Regular rate and rhythm without murmur gallop or rub normal S1 and S2 Abdomen: Positive abdominal pain, positive distention, hypoactive bowel sounds, no ascites, no appreciable mass Extremities: No significant cyanosis, clubbing, or edema bilateral lower extremities Skin: Negative rashes, lesions, ulcers Psychiatric: Positive depression, positive anxiety, negative fatigue, negative mania  Central nervous system:  Cranial nerves II through XII intact, tongue/uvula midline, all extremities muscle strength 5/5, sensation intact throughout, fnegative dysarthria, negative expressive aphasia, negative receptive aphasia.        Labs on Admission:  Basic Metabolic Panel: Recent Labs  Lab 11/19/18 1131  NA 142  K 3.9  CL 110  CO2 27  GLUCOSE 115*  BUN 16  CREATININE 0.72  CALCIUM 8.9   Liver Function Tests: Recent Labs  Lab 11/19/18 1131  AST 23  ALT 22  ALKPHOS 89  BILITOT 0.6  PROT 7.6  ALBUMIN 4.3   Recent Labs  Lab 11/19/18 1131  LIPASE 3,337*   No results for input(s): AMMONIA in the last 168 hours. CBC: Recent Labs  Lab 11/19/18 1131  WBC 11.0*  NEUTROABS 8.8*  HGB 14.5  HCT 48.2*  MCV 95.4  PLT 235   Cardiac Enzymes: No results for input(s): CKTOTAL, CKMB, CKMBINDEX, TROPONINI in the last 168 hours.  BNP (last 3 results) Recent Labs    01/01/18 1208  BNP 235.8*    ProBNP (last 3 results) No results for input(s): PROBNP in the last 8760 hours.  CBG: No results for input(s): GLUCAP in the last 168 hours.  Radiological Exams on Admission: No results found.  EKG: Independently reviewed.   Assessment/Plan Active Problems:   Depression, recurrent (HCC)   Adjustment disorder with mixed anxiety and depressed mood   Acute recurrent pancreatitis   Pancreatitis   Chronic insomnia   Obesity (BMI 30-39.9)   Recurrent acute pancreatitis   Sinus bradycardia on ECG   Basal cell carcinoma (BCC) in situ of skin   Acute on chronic pancreatitis  -Patient's GI physician is Dr. Ardis Hughs -Patient request that Dr. Ardis Hughs be contacted in the a.m. to inform him that she has been hospitalized. - Per patient this has been an ongoing issue for 22 years.  Has been worked up by Dr. Ardis Hughs as well as the [place] without resolution. - N.p.o. -Pain control -Monitor lipase closely Recent Labs  Lab 11/19/18 1131  LIPASE 3,337*   Adjustment disorder with mixed anxiety and depressed mood - While patient unable to take p.o. medication Ativan for anxiety.  Patient has agreed to this treatment plan.  Sinus bradycardia -Currently not an issue monitor closely  Chronic insomnia - Ativan should help with this issue.     Code Status: Full (DVT Prophylaxis: Lovenox Family Communication: None Disposition Plan: TBD   Data Reviewed: Care during the described time interval was provided by me .  I have reviewed this patient's available data, including medical history, events of note, physical examination, and all test results as part of my evaluation.   Time spent: 60 min  [place], [facility] Pager [phone]

## 2018-11-20 LAB — CBC
HCT: 39.3 % (ref 36.0–46.0)
Hemoglobin: 11.8 g/dL — ABNORMAL LOW (ref 12.0–15.0)
MCH: 29.2 pg (ref 26.0–34.0)
MCHC: 30 g/dL (ref 30.0–36.0)
MCV: 97.3 fL (ref 80.0–100.0)
NRBC: 0 % (ref 0.0–0.2)
Platelets: 191 10*3/uL (ref 150–400)
RBC: 4.04 MIL/uL (ref 3.87–5.11)
RDW: 13.6 % (ref 11.5–15.5)
WBC: 7.5 10*3/uL (ref 4.0–10.5)

## 2018-11-20 LAB — BASIC METABOLIC PANEL
Anion gap: 4 — ABNORMAL LOW (ref 5–15)
BUN: 12 mg/dL (ref 8–23)
CO2: 25 mmol/L (ref 22–32)
Calcium: 8 mg/dL — ABNORMAL LOW (ref 8.9–10.3)
Chloride: 113 mmol/L — ABNORMAL HIGH (ref 98–111)
Creatinine, Ser: 0.68 mg/dL (ref 0.44–1.00)
GFR calc non Af Amer: >60 mL/min (ref >60)
Glucose, Bld: 97 mg/dL (ref 70–99)
Potassium: 4 mmol/L (ref 3.5–5.1)
Sodium: 142 mmol/L (ref 135–145)

## 2018-11-20 LAB — URINALYSIS, ROUTINE W REFLEX MICROSCOPIC
Bilirubin Urine: NEGATIVE
Glucose, UA: NEGATIVE mg/dL
Ketones, ur: NEGATIVE mg/dL
Leukocytes,Ua: NEGATIVE
Nitrite: NEGATIVE
Protein, ur: NEGATIVE mg/dL
Specific Gravity, Urine: 1.023 (ref 1.005–1.030)
pH: 5 (ref 5.0–8.0)

## 2018-11-20 LAB — LIPASE, BLOOD: Lipase: 794 U/L — ABNORMAL HIGH (ref 11–51)

## 2018-11-20 MED ORDER — VANCOMYCIN HCL 500 MG IV SOLR: 500.0000 mg | Freq: Four times a day (QID) | Status: DC

## 2018-11-20 MED ORDER — SODIUM CHLORIDE 0.9 % IV BOLUS
1000.0000 mL | Freq: Once | INTRAVENOUS | Status: AC
  Administered 2018-11-20: 1000 mL via INTRAVENOUS

## 2018-11-20 MED ORDER — VANCOMYCIN 50 MG/ML ORAL SOLUTION
125.0000 mg | Freq: Four times a day (QID) | ORAL | Status: DC
  Filled 2018-11-20: qty 2.5

## 2018-11-20 NOTE — Progress Notes (Signed)
PROGRESS NOTE    Linda Blackburn  BMW:[id number] DOB: 12/04/1950 DOA: 11/19/2018 PCP: [name], MD   Brief Narrative:  Patient is a pleasant 68 year old female history of anxiety, depression, nephrolithiasis, hypertension, pancreatitis status post cholecystectomy and ERCP being followed by GI in the outpatient setting, with history of multiple recurrent episodes of pancreatitis.  Patient has been seen by gastroenterology and [facility] where she underwent EUS and ERCP in 2016 at which time pancreatic sphincterotomy and major papilla was attempted however unable to be cannulated.  Patient also seen [place] in January 2020 for ERCP.  Patient underwent multiple tests without etiology of patient's recurrent pancreatitis. Patient presented with abdominal pain located in the epigastrium radiation to the lower part of her abdomen.  Patient with no history of gastritis and denies any heavy NSAID use or alcohol use or tobacco use.  Patient noted to have lipase levels of 3337 on admission.  Patient admitted placed on bowel rest, IV fluids and supportive care.   Assessment & Plan:   Principal Problem:   Recurrent acute pancreatitis Active Problems:   Depression, recurrent (HCC)   Adjustment disorder with mixed anxiety and depressed mood   Acute recurrent pancreatitis   Pancreatitis   Chronic insomnia   Obesity (BMI 30-39.9)   Sinus bradycardia on ECG   Basal cell carcinoma (BCC) in situ of skin  1 acute recurrent pancreatitis Questionable etiology.  Patient being followed by GI in the outpatient setting.  Patient has also been seen by gastroenterology at [facility] where she underwent EUS and ERCP in 2016 at which time pancreatic sphincterotomy and major papilla was attempted however unable to be cannulated.  [facility] also [facility] in January 2020 for ERCP.  Patient noted to have undergone multiple tests without etiology of patient's recurrent pancreatitis.  Lipase  levels on admission was 3337.  Lipase trending down currently at 794.  Patient still very symptomatic with significant abdominal pain.  Patient also complaining of itching.  We will give a 2 L bolus of normal saline.  Increase IV fluids to normal saline at 150 cc/h.  Pain management.  Supportive care.  Will inform GI, Dr. Ardis Hughs of patient's admission per patient request.  2.  Adjustment disorder with mixed anxiety and depressed mood Patient noted to be on Zoloft on admission however patient currently n.p.o.  Continue IV Ativan as needed.  Once on a diet will resume home regimen of Zoloft.  3.  Sinus bradycardia Stable.  Follow.  4.  Chronic insomnia Continue current regimen of IV Ativan as needed as patient currently n.p.o.    5 basal cell carcinoma Outpatient follow-up with dermatology.   DVT prophylaxis: Lovenox Code Status: Full Family Communication; updated patient.  No family at bedside. Disposition Plan: Likely home when clinically improved with resolution of acute pancreatitis.   Consultants:   None  Procedures:   None  Antimicrobials:   None   Subjective: Patient laying in bed.  Still with significant abdominal pain.  Endorses some nausea.  No emesis.  No chest pain.  No shortness of breath.  Objective: Vitals:   11/19/18 1536 11/19/18 1925 11/19/18 1927 11/20/18 0539  BP: 113/63  118/61 (!) 119/57  Pulse: (!) 55  (!) 55 72  Resp: 16  19 18   Temp: (!) 97.5 F (36.4 C) 97.7 F (36.5 C) 97.7 F (36.5 C) 98.4 F (36.9 C)  TempSrc:  Oral  Oral  SpO2: 100%  99% 98%  Weight:  Height:        Intake/Output Summary (Last 24 hours) at 11/20/2018 1535 Last data filed at 11/20/2018 1250 Gross per 24 hour  Intake 3964.29 ml  Output -  Net 3964.29 ml   Filed Weights   11/19/18 1019  Weight: 90.7 kg    Examination:  General exam: Appears calm and comfortable  Respiratory system: Clear to auscultation. Respiratory effort normal. Cardiovascular system:  S1 & S2 heard, RRR. No JVD, murmurs, rubs, gallops or clicks. No pedal edema. Gastrointestinal system: Abdomen is nondistended, soft and diffusely tender to palpation with increased tenderness in the epigastrium.  Positive bowel sounds.  No rebound.  No guarding.  Central nervous system: Alert and oriented. No focal neurological deficits. Extremities: Symmetric 5 x 5 power. Skin: No rashes, lesions or ulcers Psychiatry: Judgement and insight appear normal. Mood & affect appropriate.     Data Reviewed: I have personally reviewed following labs and imaging studies  CBC: Recent Labs  Lab 11/19/18 1131 11/19/18 1617 11/20/18 0528  WBC 11.0* 8.9 7.5  NEUTROABS 8.8*  --   --   HGB 14.5 12.9 11.8*  HCT 48.2* 42.9 39.3  MCV 95.4 96.8 97.3  PLT 235 208 948   Basic Metabolic Panel: Recent Labs  Lab 11/19/18 1131 11/19/18 1617 11/20/18 0528  NA 142  --  142  K 3.9  --  4.0  CL 110  --  113*  CO2 27  --  25  GLUCOSE 115*  --  97  BUN 16  --  12  CREATININE 0.72 0.64 0.68  CALCIUM 8.9  --  8.0*  MG  --  2.0  --   PHOS  --  2.5  --    GFR: Estimated Creatinine Clearance: 69.5 mL/min (by C-G formula based on SCr of 0.68 mg/dL). Liver Function Tests: Recent Labs  Lab 11/19/18 1131  AST 23  ALT 22  ALKPHOS 89  BILITOT 0.6  PROT 7.6  ALBUMIN 4.3   Recent Labs  Lab 11/19/18 1131 11/20/18 0528  LIPASE 3,337* 794*   No results for input(s): AMMONIA in the last 168 hours. Coagulation Profile: No results for input(s): INR, PROTIME in the last 168 hours. Cardiac Enzymes: No results for input(s): CKTOTAL, CKMB, CKMBINDEX, TROPONINI in the last 168 hours. BNP (last 3 results) No results for input(s): PROBNP in the last 8760 hours. HbA1C: No results for input(s): HGBA1C in the last 72 hours. CBG: No results for input(s): GLUCAP in the last 168 hours. Lipid Profile: No results for input(s): CHOL, HDL, LDLCALC, TRIG, CHOLHDL, LDLDIRECT in the last 72 hours. Thyroid  Function Tests: No results for input(s): TSH, T4TOTAL, FREET4, T3FREE, THYROIDAB in the last 72 hours. Anemia Panel: No results for input(s): VITAMINB12, FOLATE, FERRITIN, TIBC, IRON, RETICCTPCT in the last 72 hours. Sepsis Labs: No results for input(s): PROCALCITON, LATICACIDVEN in the last 168 hours.  No results found for this or any previous visit (from the past 240 hour(s)).       Radiology Studies: No results found.      Scheduled Meds: . enoxaparin (LOVENOX) injection  40 mg Subcutaneous Q24H   Continuous Infusions: . sodium chloride 150 mL/hr at 11/20/18 1250     LOS: 1 day    Time spent: 35 minutes    Irine Seal, MD [facility]  If 7PM-7AM, please contact night-coverage [email or web address] 11/20/2018, 3:35 PM

## 2018-11-21 LAB — CBC
HCT: 40.2 % (ref 36.0–46.0)
Hemoglobin: 11.7 g/dL — ABNORMAL LOW (ref 12.0–15.0)
MCH: 29.2 pg (ref 26.0–34.0)
MCHC: 29.1 g/dL — ABNORMAL LOW (ref 30.0–36.0)
MCV: 100.2 fL — ABNORMAL HIGH (ref 80.0–100.0)
Platelets: 172 10*3/uL (ref 150–400)
RBC: 4.01 MIL/uL (ref 3.87–5.11)
RDW: 13.5 % (ref 11.5–15.5)
WBC: 8.1 10*3/uL (ref 4.0–10.5)
nRBC: 0 % (ref 0.0–0.2)

## 2018-11-21 LAB — COMPREHENSIVE METABOLIC PANEL
ALBUMIN: 3.3 g/dL — AB (ref 3.5–5.0)
ALT: 18 U/L (ref 0–44)
AST: 18 U/L (ref 15–41)
Alkaline Phosphatase: 75 U/L (ref 38–126)
Anion gap: 8 (ref 5–15)
BUN: 9 mg/dL (ref 8–23)
CO2: 18 mmol/L — AB (ref 22–32)
Calcium: 7.8 mg/dL — ABNORMAL LOW (ref 8.9–10.3)
Chloride: 116 mmol/L — ABNORMAL HIGH (ref 98–111)
Creatinine, Ser: 0.65 mg/dL (ref 0.44–1.00)
GFR calc Af Amer: >60 mL/min (ref >60)
GFR calc non Af Amer: >60 mL/min (ref >60)
GLUCOSE: 64 mg/dL — AB (ref 70–99)
Potassium: 3.6 mmol/L (ref 3.5–5.1)
Sodium: 142 mmol/L (ref 135–145)
Total Bilirubin: 1.7 mg/dL — ABNORMAL HIGH (ref 0.3–1.2)
Total Protein: 6 g/dL — ABNORMAL LOW (ref 6.5–8.1)

## 2018-11-21 LAB — LIPASE, BLOOD: Lipase: 77 U/L — ABNORMAL HIGH (ref 11–51)

## 2018-11-21 MED ORDER — PANTOPRAZOLE SODIUM 40 MG PO TBEC
40.0000 mg | DELAYED_RELEASE_TABLET | Freq: Every day | ORAL | Status: DC
  Administered 2018-11-21 – 2018-11-25 (×4): 40 mg via ORAL
  Filled 2018-11-21 (×4): qty 1

## 2018-11-21 MED ORDER — SODIUM BICARBONATE 650 MG PO TABS
650.0000 mg | ORAL_TABLET | Freq: Two times a day (BID) | ORAL | Status: AC
  Administered 2018-11-21 (×2): 650 mg via ORAL
  Filled 2018-11-21 (×2): qty 1

## 2018-11-21 MED ORDER — POTASSIUM CHLORIDE CRYS ER 20 MEQ PO TBCR
40.0000 meq | EXTENDED_RELEASE_TABLET | Freq: Once | ORAL | Status: AC
  Administered 2018-11-21: 40 meq via ORAL
  Filled 2018-11-21: qty 2

## 2018-11-21 MED ORDER — HYDROCODONE-ACETAMINOPHEN 5-325 MG PO TABS
1.0000 | ORAL_TABLET | ORAL | Status: DC | PRN
  Administered 2018-11-21 – 2018-11-25 (×11): 2 via ORAL
  Filled 2018-11-21 (×11): qty 2

## 2018-11-21 MED ORDER — KETOROLAC TROMETHAMINE 30 MG/ML IJ SOLN: 30.0000 mg | Freq: Four times a day (QID) | INTRAMUSCULAR | Status: DC | PRN

## 2018-11-21 MED ORDER — LIDOCAINE VISCOUS HCL 2 % MT SOLN: 15.0000 mL | OROMUCOSAL | Status: DC | PRN

## 2018-11-21 MED ORDER — ALUM & MAG HYDROXIDE-SIMETH 200-200-20 MG/5ML PO SUSP: 30.0000 mL | ORAL | Status: DC | PRN

## 2018-11-21 MED ORDER — SERTRALINE HCL 100 MG PO TABS
100.0000 mg | ORAL_TABLET | Freq: Every day | ORAL | Status: DC
  Administered 2018-11-21 – 2018-11-25 (×5): 100 mg via ORAL
  Filled 2018-11-21 (×4): qty 1
  Filled 2018-11-21: qty 2

## 2018-11-21 NOTE — Progress Notes (Addendum)
PROGRESS NOTE    Linda Blackburn  DHR:[id number] DOB: 18-Oct-1950 DOA: 11/19/2018 PCP: [name], MD   Brief Narrative:  Patient is a pleasant 68 year old female history of anxiety, depression, nephrolithiasis, hypertension, pancreatitis status post cholecystectomy and ERCP being followed by GI in the outpatient setting, with history of multiple recurrent episodes of pancreatitis.  Patient has been seen by gastroenterology and [facility] where she underwent EUS and ERCP in 2016 at which time pancreatic sphincterotomy and major papilla was attempted however unable to be cannulated.  Patient also seen [place] in January 2020 for ERCP.  Patient underwent multiple tests without etiology of patient's recurrent pancreatitis. Patient presented with abdominal pain located in the epigastrium radiation to the lower part of her abdomen.  Patient with no history of gastritis and denies any heavy NSAID use or alcohol use or tobacco use.  Patient noted to have lipase levels of 3337 on admission.  Patient admitted placed on bowel rest, IV fluids and supportive care.   Assessment & Plan:   Principal Problem:   Recurrent acute pancreatitis Active Problems:   Depression, recurrent (HCC)   Adjustment disorder with mixed anxiety and depressed mood   Acute recurrent pancreatitis   Pancreatitis   Chronic insomnia   Obesity (BMI 30-39.9)   Sinus bradycardia on ECG   Basal cell carcinoma (BCC) in situ of skin  1 acute recurrent pancreatitis Questionable etiology.  Patient being followed by GI in the outpatient setting.  Patient has also been seen by gastroenterology at [facility] where she underwent EUS and ERCP in 2016 at which time pancreatic sphincterotomy and major papilla was attempted however unable to be cannulated.  Patient also seen [facility] in January 2020 for ERCP.  Patient noted to have undergone multiple tests without etiology of patient's recurrent pancreatitis.  Lipase  levels on admission was 3337.  Lipase trending down currently at 77 from 794 from 3337.  Patient slowly improving with abdominal pain.  Patient complaining of itching.  Continue IV fluids.  Pain management.  Supportive care.  Patient's gastroenterologist, Dr. Ardis Hughs informed of patient's admission through his PA.  Continue supportive care.    2.  Adjustment disorder with mixed anxiety and depressed mood Patient noted to be on Zoloft on admission however Zoloft held as patient was n.p.o.  Patient on IV Ativan as needed.  Patient with a trial of clear liquids today and as such we will resume Zoloft.    3.  Sinus bradycardia Stable.  Follow.  4.  Chronic insomnia Continue current regimen of IV Ativan as needed.   5 basal cell carcinoma Outpatient follow-up with dermatology.  6.  Gastroesophageal reflux disease Place on Protonix 40 mg daily.  GI cocktail as needed.   DVT prophylaxis: Lovenox Code Status: Full Family Communication; updated patient.  No family at bedside. Disposition Plan: Likely home when clinically improved with resolution of acute pancreatitis.   Consultants:   None  Procedures:   None  Antimicrobials:   None   Subjective: Patient in bed.  States abdominal pain slowly improving.  Denies any emesis.  No chest pain.  No shortness of breath.  Complaining of itching.  Patient with complaints of reflux symptoms.  Objective: Vitals:   11/20/18 1929 11/21/18 0440 11/21/18 0836 11/21/18 0845  BP: 120/61 (!) 140/55 123/62 123/62  Pulse: 75 99 89 89  Resp: 20 19 18 18   Temp: 98.1 F (36.7 C) 98.2 F (36.8 C) 98.2 F (36.8 C) 98.2 F (36.8  C)  TempSrc: Oral Oral Oral Oral  SpO2: (!) 89% 95% 91% 91%  Weight:  99.1 kg    Height:        Intake/Output Summary (Last 24 hours) at 11/21/2018 1139 Last data filed at 11/21/2018 0440 Gross per 24 hour  Intake 3133.86 ml  Output 1000 ml  Net 2133.86 ml   Filed Weights   11/19/18 1019 11/21/18 0440  Weight: 90.7  kg 99.1 kg    Examination:  General exam: NAD Respiratory system: Lungs clear to auscultation bilaterally.  No wheezes, no crackles, no rhonchi.  Respiratory effort normal. Cardiovascular system: Regular rate and rhythm no murmurs rubs or gallops.  No JVD.  No lower extremity edema.   Gastrointestinal system: Abdomen is soft, nondistended, decreasing tenderness to palpation in the epigastrium.  Some diffuse tenderness to palpation.  Central nervous system: Alert and oriented. No focal neurological deficits. Extremities: Symmetric 5 x 5 power. Skin: No rashes, lesions or ulcers Psychiatry: Judgement and insight appear normal. Mood & affect appropriate.     Data Reviewed: I have personally reviewed following labs and imaging studies  CBC: Recent Labs  Lab 11/19/18 1131 11/19/18 1617 11/20/18 0528 11/21/18 0523  WBC 11.0* 8.9 7.5 8.1  NEUTROABS 8.8*  --   --   --   HGB 14.5 12.9 11.8* 11.7*  HCT 48.2* 42.9 39.3 40.2  MCV 95.4 96.8 97.3 100.2*  PLT 235 208 191 956   Basic Metabolic Panel: Recent Labs  Lab 11/19/18 1131 11/19/18 1617 11/20/18 0528 11/21/18 0523  NA 142  --  142 142  K 3.9  --  4.0 3.6  CL 110  --  113* 116*  CO2 27  --  25 18*  GLUCOSE 115*  --  97 64*  BUN 16  --  12 9  CREATININE 0.72 0.64 0.68 0.65  CALCIUM 8.9  --  8.0* 7.8*  MG  --  2.0  --   --   PHOS  --  2.5  --   --    GFR: Estimated Creatinine Clearance: 73 mL/min (by C-G formula based on SCr of 0.65 mg/dL). Liver Function Tests: Recent Labs  Lab 11/19/18 1131 11/21/18 0523  AST 23 18  ALT 22 18  ALKPHOS 89 75  BILITOT 0.6 1.7*  PROT 7.6 6.0*  ALBUMIN 4.3 3.3*   Recent Labs  Lab 11/19/18 1131 11/20/18 0528 11/21/18 0523  LIPASE 3,337* 794* 77*   No results for input(s): AMMONIA in the last 168 hours. Coagulation Profile: No results for input(s): INR, PROTIME in the last 168 hours. Cardiac Enzymes: No results for input(s): CKTOTAL, CKMB, CKMBINDEX, TROPONINI in the last  168 hours. BNP (last 3 results) No results for input(s): PROBNP in the last 8760 hours. HbA1C: No results for input(s): HGBA1C in the last 72 hours. CBG: No results for input(s): GLUCAP in the last 168 hours. Lipid Profile: No results for input(s): CHOL, HDL, LDLCALC, TRIG, CHOLHDL, LDLDIRECT in the last 72 hours. Thyroid Function Tests: No results for input(s): TSH, T4TOTAL, FREET4, T3FREE, THYROIDAB in the last 72 hours. Anemia Panel: No results for input(s): VITAMINB12, FOLATE, FERRITIN, TIBC, IRON, RETICCTPCT in the last 72 hours. Sepsis Labs: No results for input(s): PROCALCITON, LATICACIDVEN in the last 168 hours.  No results found for this or any previous visit (from the past 240 hour(s)).       Radiology Studies: No results found.      Scheduled Meds: . enoxaparin (LOVENOX) injection  40 mg  Subcutaneous Q24H  . sodium bicarbonate  650 mg Oral BID   Continuous Infusions: . sodium chloride 150 mL/hr at 11/21/18 9147     LOS: 2 days    Time spent: 35 minutes    Irine Seal, MD [facility]  If 7PM-7AM, please contact night-coverage [email or web address] 11/21/2018, 11:39 AM

## 2018-11-21 NOTE — Progress Notes (Signed)
OT Cancellation Note  Patient Details Name: Laquitta Dominski MRN: [id number] DOB: 03-16-1951   Cancelled Treatment:    Reason Eval/Treat Not Completed: Other (comment). Pt feels like she will [facility] to [facility]. She feels a little wobbly due to not eating/meds. Will check back prior to d/c to make sure that she doesn't need OT  Kiele Heavrin 11/21/2018, 3:33 PM  Lesle Chris, OTR/L Acute Rehabilitation Services [phone] [facility] pager [phone] office 11/21/2018

## 2018-11-22 LAB — LIPASE, BLOOD: Lipase: 33 U/L (ref 11–51)

## 2018-11-22 LAB — BASIC METABOLIC PANEL
Anion gap: 5 (ref 5–15)
BUN: 6 mg/dL — ABNORMAL LOW (ref 8–23)
CO2: 20 mmol/L — ABNORMAL LOW (ref 22–32)
Calcium: 7.9 mg/dL — ABNORMAL LOW (ref 8.9–10.3)
Chloride: 115 mmol/L — ABNORMAL HIGH (ref 98–111)
Creatinine, Ser: 0.6 mg/dL (ref 0.44–1.00)
GFR calc non Af Amer: >60 mL/min (ref >60)
Glucose, Bld: 96 mg/dL (ref 70–99)
Potassium: 3.8 mmol/L (ref 3.5–5.1)
Sodium: 140 mmol/L (ref 135–145)

## 2018-11-22 MED ORDER — FUROSEMIDE 10 MG/ML IJ SOLN
20.0000 mg | Freq: Once | INTRAMUSCULAR | Status: AC
  Administered 2018-11-22: 20 mg via INTRAVENOUS
  Filled 2018-11-22: qty 2

## 2018-11-22 NOTE — Progress Notes (Signed)
PROGRESS NOTE    Linda Blackburn  NFA:[id number] DOB: [birth year] DOA: 11/19/2018 PCP: [name], MD   Brief Narrative:  Patient is a pleasant 68 year old female history of anxiety, depression, nephrolithiasis, hypertension, pancreatitis status post cholecystectomy and ERCP being followed by GI in the outpatient setting, with history of multiple recurrent episodes of pancreatitis.  Patient has been seen by gastroenterology and [facility] where she underwent EUS and ERCP in 2016 at which time pancreatic sphincterotomy and major papilla was attempted however unable to be cannulated.  Patient also seen [facility] in January 2020 for ERCP.  Patient underwent multiple tests without etiology of patient's recurrent pancreatitis. Patient presented with abdominal pain located in the epigastrium radiation to the lower part of her abdomen.  Patient with no history of gastritis and denies any heavy NSAID use or alcohol use or tobacco use.  Patient noted to have lipase levels of 3337 on admission.  Patient admitted placed on bowel rest, IV fluids and supportive care.   Assessment & Plan:   Principal Problem:   Recurrent acute pancreatitis Active Problems:   Depression, recurrent (HCC)   Adjustment disorder with mixed anxiety and depressed mood   Acute recurrent pancreatitis   Pancreatitis   Chronic insomnia   Obesity (BMI 30-39.9)   Sinus bradycardia on ECG   Basal cell carcinoma (BCC) in situ of skin  1 acute recurrent pancreatitis Questionable etiology.  Patient being followed by GI in the outpatient setting.  Patient has also been seen by gastroenterology at [facility] where she underwent EUS and ERCP in 2016 at which time pancreatic sphincterotomy and major papilla was attempted however unable to be cannulated.  Patient also seen at [facility] in January 2020 for ERCP.  Patient noted to have undergone multiple tests without etiology of patient's recurrent pancreatitis.  Lipase  levels on admission was 3337.  Lipase trending down currently at 33 from 77 from 794 from 3337.  Patient slowly improving with abdominal pain.  Patient tolerated clear liquids.  Itching resolved.  Patient with complaints of a cough with some bibasilar crackles and as such we will saline lock IV fluids.  Lasix 20 mg IV x1.  Patient was placed on a full liquid diet and tried 2 small cups of ice cream with complaints of diarrhea, abdominal discomfort, nausea.  Placed back on a clear liquid diet.  Trial of a soft diet in the morning.  Continue current pain management and supportive care. Patient's gastroenterologist, Dr. Ardis Hughs informed of patient's admission through his PA.  Continue supportive care.    2.  Adjustment disorder with mixed anxiety and depressed mood Patient noted to be on Zoloft on admission however Zoloft held as patient was n.p.o.  Patient on IV Ativan as needed.  Zoloft resumed as patient was tolerating clear liquids.  Follow.   3.  Sinus bradycardia Stable.  Follow.  4.  Chronic insomnia Continue current regimen of IV Ativan as needed.   5 basal cell carcinoma Outpatient follow-up with dermatology.  6.  Gastroesophageal reflux disease Continue Protonix 40 mg daily.  GI cocktail as needed.     DVT prophylaxis: Lovenox Code Status: Full Family Communication; updated patient.  No family at bedside. Disposition Plan: Likely home when clinically improved with resolution of acute pancreatitis.   Consultants:   None  Procedures:   None  Antimicrobials:   None   Subjective: Patient laying in bed.  States was doing better this morning however ate 2 cups of ice cream  and had some diarrhea and some abdominal pain with some associated nausea.  No chest pain.  No shortness of breath.  Complaining of a nonproductive cough.  Itching improved.  Reflux symptoms improved.   Objective: Vitals:   11/21/18 0845 11/21/18 1341 11/21/18 1951 11/22/18 0518  BP: 123/62 129/60 114/70  (!) 128/56  Pulse: 89 79 67 80  Resp: 18 18 19 20   Temp: 98.2 F (36.8 C) 97.6 F (36.4 C) (!) 97.5 F (36.4 C) 98.2 F (36.8 C)  TempSrc: Oral Oral Oral Oral  SpO2: 91% 98% 95% 97%  Weight:    102 kg  Height:        Intake/Output Summary (Last 24 hours) at 11/22/2018 1250 Last data filed at 11/22/2018 1100 Gross per 24 hour  Intake 5601.49 ml  Output 1850 ml  Net 3751.49 ml   Filed Weights   11/19/18 1019 11/21/18 0440 11/22/18 0518  Weight: 90.7 kg 99.1 kg 102 kg    Examination:  General exam: NAD Respiratory system: Some bibasilar crackles.  No rhonchi.  No wheezing.  Speaking in full sentences.  No use of accessory muscles of respiration.   Respiratory effort normal. Cardiovascular system: RRR no murmurs rubs or gallops.  No JVD.  No lower extremity edema.  Gastrointestinal system: Abdomen is soft, nondistended, decreasing tenderness to palpation in the epigastrium.  Some diffuse tenderness to palpation.  Positive bowel sounds.  No rebound.  No guarding.  Central nervous system: Alert and oriented. No focal neurological deficits. Extremities: Symmetric 5 x 5 power. Skin: No rashes, lesions or ulcers Psychiatry: Judgement and insight appear normal. Mood & affect appropriate.     Data Reviewed: I have personally reviewed following labs and imaging studies  CBC: Recent Labs  Lab 11/19/18 1131 11/19/18 1617 11/20/18 0528 11/21/18 0523  WBC 11.0* 8.9 7.5 8.1  NEUTROABS 8.8*  --   --   --   HGB 14.5 12.9 11.8* 11.7*  HCT 48.2* 42.9 39.3 40.2  MCV 95.4 96.8 97.3 100.2*  PLT 235 208 191 694   Basic Metabolic Panel: Recent Labs  Lab 11/19/18 1131 11/19/18 1617 11/20/18 0528 11/21/18 0523 11/22/18 0523  NA 142  --  142 142 140  K 3.9  --  4.0 3.6 3.8  CL 110  --  113* 116* 115*  CO2 27  --  25 18* 20*  GLUCOSE 115*  --  97 64* 96  BUN 16  --  12 9 6*  CREATININE 0.72 0.64 0.68 0.65 0.60  CALCIUM 8.9  --  8.0* 7.8* 7.9*  MG  --  2.0  --   --   --   PHOS   --  2.5  --   --   --    GFR: Estimated Creatinine Clearance: 74.3 mL/min (by C-G formula based on SCr of 0.6 mg/dL). Liver Function Tests: Recent Labs  Lab 11/19/18 1131 11/21/18 0523  AST 23 18  ALT 22 18  ALKPHOS 89 75  BILITOT 0.6 1.7*  PROT 7.6 6.0*  ALBUMIN 4.3 3.3*   Recent Labs  Lab 11/19/18 1131 11/20/18 0528 11/21/18 0523 11/22/18 0523  LIPASE 3,337* 794* 77* 33   No results for input(s): AMMONIA in the last 168 hours. Coagulation Profile: No results for input(s): INR, PROTIME in the last 168 hours. Cardiac Enzymes: No results for input(s): CKTOTAL, CKMB, CKMBINDEX, TROPONINI in the last 168 hours. BNP (last 3 results) No results for input(s): PROBNP in the last 8760 hours. HbA1C: No  results for input(s): HGBA1C in the last 72 hours. CBG: No results for input(s): GLUCAP in the last 168 hours. Lipid Profile: No results for input(s): CHOL, HDL, LDLCALC, TRIG, CHOLHDL, LDLDIRECT in the last 72 hours. Thyroid Function Tests: No results for input(s): TSH, T4TOTAL, FREET4, T3FREE, THYROIDAB in the last 72 hours. Anemia Panel: No results for input(s): VITAMINB12, FOLATE, FERRITIN, TIBC, IRON, RETICCTPCT in the last 72 hours. Sepsis Labs: No results for input(s): PROCALCITON, LATICACIDVEN in the last 168 hours.  No results found for this or any previous visit (from the past 240 hour(s)).       Radiology Studies: No results found.      Scheduled Meds:  enoxaparin (LOVENOX) injection  40 mg Subcutaneous Q24H   pantoprazole  40 mg Oral Q0600   sertraline  100 mg Oral Daily   Continuous Infusions:  sodium chloride Stopped (11/22/18 1228)     LOS: 3 days    Time spent: 35 minutes    Irine Seal, MD [facility]  If 7PM-7AM, please contact night-coverage [email or web address] 11/22/2018, 12:50 PM

## 2018-11-22 NOTE — Evaluation (Signed)
Occupational Therapy Evaluation Patient Details Name: Linda Blackburn MRN: [id number] DOB: 1951/09/03 Today's Date: 11/22/2018    History of Present Illness pt admitted with recurrent acute pancreatitis.  PMH:  HTN, depression, and anxiety   Clinical Impression   This 68 year old female was admitted for the above. She is independent and lives alone at baseline. Pt needs mostly min guard for balance at this time, but she also had some back pain so occasional min A is needed. Will follow in acute setting with mod I to Independent level goals    Follow Up Recommendations  No OT follow up(likely)    Equipment Recommendations    to be further assessed, likely none   Recommendations for Other Services       Precautions / Restrictions Precautions Precautions: Fall Restrictions Weight Bearing Restrictions: No      Mobility Bed Mobility                  Transfers Overall transfer level: Needs assistance Equipment used: 1 person hand held assist Transfers: Sit to/from Stand Sit to Stand: Min guard         General transfer comment: for safety    Balance Overall balance assessment: Mild deficits observed, not formally tested                                         ADL either performed or assessed with clinical judgement   ADL Overall ADL's : Needs assistance/impaired     Grooming: Wash/dry hands;Supervision/safety;Standing                   Toilet Transfer: Min guard;Ambulation;Comfort height toilet   Toileting- Clothing Manipulation and Hygiene: Min guard         General ADL Comments: pt needs min guard assist to gather clothes and min a for LB adls due to back pain:  normally this doesn't bother her.  She was a little unsteady walking and used IV pole for support. Close guard for supervision     Vision         Perception     Praxis      Pertinent Vitals/Pain Pain Assessment: Faces Faces Pain Scale: Hurts little more Pain  Location: low back Pain Intervention(s): Limited activity within patient's tolerance;Monitored during session;Repositioned     Hand Dominance     Extremity/Trunk Assessment Upper Extremity Assessment Upper Extremity Assessment: Overall WFL for tasks assessed           Communication Communication Communication: No difficulties   Cognition Arousal/Alertness: Awake/alert Behavior During Therapy: WFL for tasks assessed/performed Overall Cognitive Status: Within Functional Limits for tasks assessed                                     General Comments  felt a little dizzy when up:  BP wfls    Exercises     Shoulder Instructions      Home Living Family/patient expects to be discharged to:: Private residence Living Arrangements: Alone   Type of Home: Apartment Home Access: Stairs to enter [employer] of Steps: 1  Entrance Stairs-Rails: None Home Layout: One level     Bathroom Shower/Tub: Tub/shower unit         Home Equipment: None          Prior  Functioning/Environment Level of Independence: Independent                 OT Problem List: Impaired balance (sitting and/or standing);Pain;Decreased knowledge of use of DME or AE      OT Treatment/Interventions: Self-care/ADL training;DME and/or AE instruction;Energy conservation;Balance training;Patient/family education;Therapeutic activities    OT Goals(Current goals can be found in the care plan section) Acute Rehab OT Goals Patient Stated Goal: return to independence OT Goal Formulation: With patient Time For Goal Achievement: 12/06/18 Potential to Achieve Goals: Good ADL Goals Pt Will Transfer to Toilet: Independently;ambulating;regular height toilet Pt Will Perform Tub/Shower Transfer: Tub transfer;with modified independence;ambulating(? seat vs none) Additional ADL Goal #1: pt will gather clothes at mod I level, using reacher as needed and complete ADL at this level  OT  Frequency: Min 2X/week   Barriers to D/C:            Co-evaluation              AM-PAC OT "6 Clicks" Daily Activity     Outcome Measure Help from another person eating meals?: None Help from another person taking care of personal grooming?: A Little Help from another person toileting, which includes using toliet, bedpan, or urinal?: A Little Help from another person bathing (including washing, rinsing, drying)?: A Little Help from another person to put on and taking off regular upper body clothing?: A Little Help from another person to put on and taking off regular lower body clothing?: A Little 6 Click Score: 19   End of Session    Activity Tolerance: Patient tolerated treatment well Patient left: in chair;with call bell/phone within reach  OT Visit Diagnosis: Unsteadiness on feet (R26.81)                Time: 8921-1941 OT Time Calculation (min): 16 min Charges:  OT General Charges $OT Visit: 1 Visit OT Evaluation $OT Eval Low Complexity: [place], OTR/L Acute Rehabilitation Services [phone] [facility] pager [phone] office 11/22/2018  [place] 11/22/2018, 11:22 AM

## 2018-11-22 NOTE — Plan of Care (Signed)

## 2018-11-22 NOTE — Evaluation (Signed)
Physical Therapy Evaluation Patient Details Name: Linda Blackburn MRN: [id number] DOB: [birth year] Today's Date: 11/22/2018   History of Present Illness  68 yo female admitted with recurrent pancreatitis. Hx of cholecystectomy, pancreatitis, obesity, anxiety, depression  Clinical Impression  On eval, pt was Min guard assist for mobility. She walked ~120 feet while holding on to the IV pole for support. Pt is unsteady with mild general weakness. Will continue to follow. Recommend daily ambulation in the hallway with nursing supervision.     Follow Up Recommendations Home health PT    Equipment Recommendations       Recommendations for Other Services       Precautions / Restrictions Precautions Precautions: Fall Restrictions Weight Bearing Restrictions: No      Mobility  Bed Mobility Overal bed mobility: Modified Independent                Transfers Overall transfer level: Needs assistance Equipment used: 1 person hand held assist Transfers: Sit to/from Stand Sit to Stand: Min guard         General transfer comment: for safety  Ambulation/Gait Ambulation/Gait assistance: Min guard Gait Distance (Feet): 120 Feet Assistive device: IV Pole Gait Pattern/deviations: Step-through pattern;Decreased stride length;Drifts right/left     General Gait Details: for safety. Intermittently unsteady with posterior swaying  Stairs            Wheelchair Mobility    Modified Rankin (Stroke Patients Only)       Balance Overall balance assessment: Mild deficits observed, not formally tested                                           Pertinent Vitals/Pain Pain Assessment: Faces Faces Pain Scale: Hurts little more Pain Location: low back Pain Intervention(s): Monitored during session;Repositioned    Home Living Family/patient expects to be discharged to:: Private residence Living Arrangements: Alone   Type of Home: Apartment Home Access:  Stairs to enter Entrance Stairs-Rails: None Entrance Stairs-Number of Steps: 1  Home Layout: One level Home Equipment: None      Prior Function Level of Independence: Independent               Hand Dominance        Extremity/Trunk Assessment   Upper Extremity Assessment Upper Extremity Assessment: Defer to OT evaluation    Lower Extremity Assessment Lower Extremity Assessment: Generalized weakness    Cervical / Trunk Assessment Cervical / Trunk Assessment: Normal  Communication   Communication: No difficulties  Cognition Arousal/Alertness: Awake/alert Behavior During Therapy: WFL for tasks assessed/performed Overall Cognitive Status: Within Functional Limits for tasks assessed                                        General Comments General comments (skin integrity, edema, etc.): felt a little dizzy when up:  BP wfls    Exercises     Assessment/Plan    PT Assessment Patient needs continued PT services  PT Problem List Decreased balance;Decreased strength;Decreased activity tolerance;Decreased mobility       PT Treatment Interventions DME instruction;Gait training;Therapeutic activities;Functional mobility training;Balance training;Patient/family education;Therapeutic exercise    PT Goals (Current goals can be found in the Care Plan section)  Acute Rehab PT Goals Patient Stated Goal: return to independence Time For Goal Achievement:  12/06/18 Potential to Achieve Goals: Good    Frequency Min 3X/week   Barriers to discharge        Co-evaluation               AM-PAC PT "6 Clicks" Mobility  Outcome Measure Help needed turning from your back to your side while in a flat bed without using bedrails?: None Help needed moving from lying on your back to sitting on the side of a flat bed without using bedrails?: None Help needed moving to and from a bed to a chair (including a wheelchair)?: A Little Help needed standing up from a  chair using your arms (e.g., wheelchair or bedside chair)?: A Little Help needed to walk in hospital room?: A Little Help needed climbing 3-5 steps with a railing? : A Little 6 Click Score: 20    End of Session Equipment Utilized During Treatment: Gait belt Activity Tolerance: Patient tolerated treatment well Patient left: in chair;with call bell/phone within reach   PT Visit Diagnosis: Unsteadiness on feet (R26.81);Muscle weakness (generalized) (M62.81)    Time: 6195-0932 PT Time Calculation (min) (ACUTE ONLY): 16 min   Charges:   PT Evaluation $PT Eval Moderate Complexity: [place], PT Acute Rehabilitation Services Pager: [phone] Office: [phone]

## 2018-11-22 NOTE — Care Management Important Message (Signed)
Important Message  Patient Details  Name: Honour Schwieger MRN: [id number] Date of Birth: 1951/02/19   Medicare Important Message Given:  Yes    Kerin Salen 11/22/2018, 11:10 AMImportant Message  Patient Details  Name: Taqwa Deem MRN: [id number] Date of Birth: Aug 11, 1951   Medicare Important Message Given:  Yes    Kerin Salen 11/22/2018, 11:10 AM

## 2018-11-23 LAB — CBC WITH DIFFERENTIAL/PLATELET
Abs Immature Granulocytes: 0.02 10*3/uL (ref 0.00–0.07)
Basophils Absolute: 0.1 10*3/uL (ref 0.0–0.1)
Basophils Relative: 1 %
EOS PCT: 4 %
Eosinophils Absolute: 0.2 10*3/uL (ref 0.0–0.5)
HCT: 37.7 % (ref 36.0–46.0)
Hemoglobin: 11.6 g/dL — ABNORMAL LOW (ref 12.0–15.0)
Immature Granulocytes: 0 %
Lymphocytes Relative: 36 %
Lymphs Abs: 2 10*3/uL (ref 0.7–4.0)
MCH: 28.7 pg (ref 26.0–34.0)
MCHC: 30.8 g/dL (ref 30.0–36.0)
MCV: 93.3 fL (ref 80.0–100.0)
MONOS PCT: 9 %
Monocytes Absolute: 0.5 10*3/uL (ref 0.1–1.0)
Neutro Abs: 2.8 10*3/uL (ref 1.7–7.7)
Neutrophils Relative %: 50 %
Platelets: 216 10*3/uL (ref 150–400)
RBC: 4.04 MIL/uL (ref 3.87–5.11)
RDW: 12.9 % (ref 11.5–15.5)
WBC: 5.5 10*3/uL (ref 4.0–10.5)
nRBC: 0 % (ref 0.0–0.2)

## 2018-11-23 LAB — MAGNESIUM: Magnesium: 1.9 mg/dL (ref 1.7–2.4)

## 2018-11-23 LAB — BASIC METABOLIC PANEL
ANION GAP: 3 — AB (ref 5–15)
Anion gap: 7 (ref 5–15)
BUN: <5 mg/dL — ABNORMAL LOW (ref 8–23)
BUN: <5 mg/dL — ABNORMAL LOW (ref 8–23)
CHLORIDE: 110 mmol/L (ref 98–111)
CO2: 25 mmol/L (ref 22–32)
CO2: 26 mmol/L (ref 22–32)
Calcium: 8.3 mg/dL — ABNORMAL LOW (ref 8.9–10.3)
Calcium: 8.1 mg/dL — ABNORMAL LOW (ref 8.9–10.3)
Chloride: 108 mmol/L (ref 98–111)
Creatinine, Ser: 0.57 mg/dL (ref 0.44–1.00)
Creatinine, Ser: 0.51 mg/dL (ref 0.44–1.00)
GFR calc Af Amer: >60 mL/min (ref >60)
GFR calc Af Amer: >60 mL/min (ref >60)
GFR calc non Af Amer: >60 mL/min (ref >60)
GFR calc non Af Amer: >60 mL/min (ref >60)
Glucose, Bld: 76 mg/dL (ref 70–99)
Glucose, Bld: 122 mg/dL — ABNORMAL HIGH (ref 70–99)
Potassium: 2.7 mmol/L — CL (ref 3.5–5.1)
Potassium: 3.2 mmol/L — ABNORMAL LOW (ref 3.5–5.1)
Sodium: 140 mmol/L (ref 135–145)
Sodium: 139 mmol/L (ref 135–145)

## 2018-11-23 LAB — LIPASE, BLOOD: Lipase: 26 U/L (ref 11–51)

## 2018-11-23 MED ORDER — POTASSIUM CHLORIDE CRYS ER 20 MEQ PO TBCR
40.0000 meq | EXTENDED_RELEASE_TABLET | ORAL | Status: AC
  Administered 2018-11-23 (×2): 40 meq via ORAL
  Filled 2018-11-23 (×2): qty 2

## 2018-11-23 MED ORDER — DIPHENHYDRAMINE HCL 25 MG PO CAPS: 25.0000 mg | ORAL_CAPSULE | Freq: Four times a day (QID) | ORAL | Status: DC | PRN

## 2018-11-23 NOTE — Progress Notes (Signed)
Physical Therapy Discharge Patient Details Name: Starlyn Droge MRN: [id number] DOB: 23-May-1951 Today's Date: 11/23/2018 Time:  -     Patient discharged from PT services secondary to goals met and no further PT needs identified.Ambulating  Ad lib in room and w/ CNA in hall.  Please see latest therapy progress note for current level of functioning and progress toward goals.    Progress and discharge plan discussed with patient and/or caregiver: Patient observed ambulating in hall earlier, occasional  Support of rail. Patient reports no further PT needs.   GP     Claretha Cooper 11/23/2018, 2:05 PM [place] Pager [phone] Office [phone]

## 2018-11-23 NOTE — Progress Notes (Signed)
PROGRESS NOTE    Linda Blackburn  OVZ:[id number] DOB: 09-06-1950 DOA: 11/19/2018 PCP: [name], MD   Brief Narrative:  Patient is a pleasant 68 year old female history of anxiety, depression, nephrolithiasis, hypertension, pancreatitis status post cholecystectomy and ERCP being followed by GI in the outpatient setting, with history of multiple recurrent episodes of pancreatitis.  Patient has been seen by gastroenterology and [facility] where she underwent EUS and ERCP in 2016 at which time pancreatic sphincterotomy and major papilla was attempted however unable to be cannulated.  Patient also seen [place] in January 2020 for ERCP.  Patient underwent multiple tests without etiology of patient's recurrent pancreatitis. Patient presented with abdominal pain located in the epigastrium radiation to the lower part of her abdomen.  Patient with no history of gastritis and denies any heavy NSAID use or alcohol use or tobacco use.  Patient noted to have lipase levels of 3337 on admission.  Patient admitted placed on bowel rest, IV fluids and supportive care.   Assessment & Plan:   Principal Problem:   Recurrent acute pancreatitis Active Problems:   Depression, recurrent (HCC)   Adjustment disorder with mixed anxiety and depressed mood   Acute recurrent pancreatitis   Pancreatitis   Chronic insomnia   Obesity (BMI 30-39.9)   Sinus bradycardia on ECG   Basal cell carcinoma (BCC) in situ of skin  1 acute recurrent pancreatitis Questionable etiology.  Patient being followed by GI in the outpatient setting.  Patient has also been seen by gastroenterology at [facility] where she underwent EUS and ERCP in 2016 at which time pancreatic sphincterotomy and major papilla was attempted however unable to be cannulated.  Patient also seen at [facility] in January 2020 for ERCP.  Patient noted to have undergone multiple tests without etiology of patient's recurrent pancreatitis.  Lipase  levels on admission was 3337.  Lipase trending down currently at 33 from 77 from 794 from 3337.  Patient slowly improving with abdominal pain.  Patient tolerated clear liquids.  Itching resolved.  Patient with complaints of a cough with some bibasilar crackles and as such IV fluids with saline locked and patient given a dose of IV Lasix with improvement with cough and a urine output of 2.750 L. Patient was placed on a full liquid diet and tried 2 small cups of ice cream on 11/22/2018, with complaints of diarrhea, abdominal discomfort, nausea.  Placed back on a clear liquid diet which she is tolerated.  Advance to soft diet. Continue current pain management and supportive care. Patient's gastroenterologist, Dr. Ardis Hughs informed of patient's admission through his PA.  Continue supportive care.    2.  Adjustment disorder with mixed anxiety and depressed mood Patient noted to be on Zoloft on admission however Zoloft was initially held as patient was n.p.o.  Patient on IV Ativan as needed.  Zoloft resumed as patient was tolerating clear liquids.  Follow.   3.  Sinus bradycardia Stable.  Follow.  4.  Chronic insomnia Continue current regimen of IV Ativan as needed.   5 basal cell carcinoma Outpatient follow-up with dermatology.  6.  Gastroesophageal reflux disease Continue Protonix 40 mg daily.  GI cocktail as needed.  7.  Hypokalemia Replete.   DVT prophylaxis: Lovenox Code Status: Full Family Communication; updated patient.  No family at bedside. Disposition Plan: Likely home when clinically improved with resolution of acute pancreatitis and when tolerating oral intake.   Consultants:   None  Procedures:   None  Antimicrobials:  None   Subjective: Patient laying in bed sleeping however easily arousable.  Denies any heartburn.  States abdominal pain improving.  Denies any chest pain.  States cough improved with diuresis.  Has not attempted a soft diet this morning.  Feeling better.    Objective: Vitals:   11/22/18 1440 11/22/18 2132 11/23/18 0500 11/23/18 0505  BP: (!) 142/69 131/67  137/66  Pulse: 72 61  66  Resp:  16  16  Temp: 98.5 F (36.9 C) 98.2 F (36.8 C)  98.1 F (36.7 C)  TempSrc: Oral Oral    SpO2: 95% 94%  94%  Weight:   97.7 kg   Height:        Intake/Output Summary (Last 24 hours) at 11/23/2018 0946 Last data filed at 11/23/2018 0500 Gross per 24 hour  Intake 340 ml  Output 2750 ml  Net -2410 ml   Filed Weights   11/21/18 0440 11/22/18 0518 11/23/18 0500  Weight: 99.1 kg 102 kg 97.7 kg    Examination:  General exam: NAD Respiratory system: Decreased bibasilar crackles.  No rhonchi.  No wheezing.  Speaking in full sentences.  No use of accessory muscles of respiration.  Cardiovascular system: Regular rate and rhythm no murmurs rubs or gallops.  No JVD.  No lower extremity edema. Gastrointestinal system: Abdomen is soft, decreased tenderness to palpation in the epigastrium and diffusely, nondistended, positive bowel sounds.  No rebound.  No guarding.   Central nervous system: Alert and oriented. No focal neurological deficits. Extremities: Symmetric 5 x 5 power. Skin: No rashes, lesions or ulcers Psychiatry: Judgement and insight appear normal. Mood & affect appropriate.     Data Reviewed: I have personally reviewed following labs and imaging studies  CBC: Recent Labs  Lab 11/19/18 1131 11/19/18 1617 11/20/18 0528 11/21/18 0523 11/23/18 0557  WBC 11.0* 8.9 7.5 8.1 5.5  NEUTROABS 8.8*  --   --   --  2.8  HGB 14.5 12.9 11.8* 11.7* 11.6*  HCT 48.2* 42.9 39.3 40.2 37.7  MCV 95.4 96.8 97.3 100.2* 93.3  PLT 235 208 191 172 431   Basic Metabolic Panel: Recent Labs  Lab 11/19/18 1131 11/19/18 1617 11/20/18 0528 11/21/18 0523 11/22/18 0523 11/23/18 0557 11/23/18 0643  NA 142  --  142 142 140 140  --   K 3.9  --  4.0 3.6 3.8 2.7*  --   CL 110  --  113* 116* 115* 108  --   CO2 27  --  25 18* 20* 25  --   GLUCOSE 115*  --   97 64* 96 76  --   BUN 16  --  12 9 6* <5*  --   CREATININE 0.72 0.64 0.68 0.65 0.60 0.57  --   CALCIUM 8.9  --  8.0* 7.8* 7.9* 8.3*  --   MG  --  2.0  --   --   --   --  1.9  PHOS  --  2.5  --   --   --   --   --    GFR: Estimated Creatinine Clearance: 72.4 mL/min (by C-G formula based on SCr of 0.57 mg/dL). Liver Function Tests: Recent Labs  Lab 11/19/18 1131 11/21/18 0523  AST 23 18  ALT 22 18  ALKPHOS 89 75  BILITOT 0.6 1.7*  PROT 7.6 6.0*  ALBUMIN 4.3 3.3*   Recent Labs  Lab 11/19/18 1131 11/20/18 0528 11/21/18 0523 11/22/18 0523 11/23/18 0557  LIPASE 3,337* 794* 77* 33  26   No results for input(s): AMMONIA in the last 168 hours. Coagulation Profile: No results for input(s): INR, PROTIME in the last 168 hours. Cardiac Enzymes: No results for input(s): CKTOTAL, CKMB, CKMBINDEX, TROPONINI in the last 168 hours. BNP (last 3 results) No results for input(s): PROBNP in the last 8760 hours. HbA1C: No results for input(s): HGBA1C in the last 72 hours. CBG: No results for input(s): GLUCAP in the last 168 hours. Lipid Profile: No results for input(s): CHOL, HDL, LDLCALC, TRIG, CHOLHDL, LDLDIRECT in the last 72 hours. Thyroid Function Tests: No results for input(s): TSH, T4TOTAL, FREET4, T3FREE, THYROIDAB in the last 72 hours. Anemia Panel: No results for input(s): VITAMINB12, FOLATE, FERRITIN, TIBC, IRON, RETICCTPCT in the last 72 hours. Sepsis Labs: No results for input(s): PROCALCITON, LATICACIDVEN in the last 168 hours.  No results found for this or any previous visit (from the past 240 hour(s)).       Radiology Studies: No results found.      Scheduled Meds: . enoxaparin (LOVENOX) injection  40 mg Subcutaneous Q24H  . pantoprazole  40 mg Oral Q0600  . potassium chloride  40 mEq Oral Q4H  . sertraline  100 mg Oral Daily   Continuous Infusions:    LOS: 4 days    Time spent: 35 minutes    Irine Seal, MD [facility]  If  7PM-7AM, please contact night-coverage [email or web address] 11/23/2018, 9:46 AM

## 2018-11-23 NOTE — Progress Notes (Signed)
CRITICAL VALUE ALERT  Critical Value:  K=2.7    Date & Time Notied:  3/21 0745  Provider Notified: Benny Lennert  Orders Received/Actions taken: Awaiting response

## 2018-11-24 ENCOUNTER — Inpatient Hospital Stay (HOSPITAL_COMMUNITY): Payer: Medicare Other

## 2018-11-24 LAB — BASIC METABOLIC PANEL
Anion gap: 9 (ref 5–15)
BUN: 7 mg/dL — ABNORMAL LOW (ref 8–23)
CO2: 24 mmol/L (ref 22–32)
CREATININE: 0.57 mg/dL (ref 0.44–1.00)
Calcium: 8.4 mg/dL — ABNORMAL LOW (ref 8.9–10.3)
Chloride: 109 mmol/L (ref 98–111)
GFR calc Af Amer: >60 mL/min (ref >60)
GFR calc non Af Amer: >60 mL/min (ref >60)
Glucose, Bld: 79 mg/dL (ref 70–99)
Potassium: 3.3 mmol/L — ABNORMAL LOW (ref 3.5–5.1)
Sodium: 142 mmol/L (ref 135–145)

## 2018-11-24 LAB — LIPASE, BLOOD: Lipase: 36 U/L (ref 11–51)

## 2018-11-24 MED ORDER — FUROSEMIDE 10 MG/ML IJ SOLN
40.0000 mg | Freq: Two times a day (BID) | INTRAMUSCULAR | Status: AC
  Administered 2018-11-24 (×2): 40 mg via INTRAVENOUS
  Filled 2018-11-24 (×2): qty 4

## 2018-11-24 MED ORDER — SORBITOL 70 % SOLN
30.0000 mL | Freq: Once | Status: AC
  Administered 2018-11-24: 30 mL via ORAL
  Filled 2018-11-24: qty 30

## 2018-11-24 MED ORDER — POTASSIUM CHLORIDE CRYS ER 20 MEQ PO TBCR
40.0000 meq | EXTENDED_RELEASE_TABLET | ORAL | Status: AC
  Administered 2018-11-24 (×2): 40 meq via ORAL
  Filled 2018-11-24 (×2): qty 2

## 2018-11-24 NOTE — Progress Notes (Signed)
Occupational Therapy Treatment/  DISCHARGE Patient Details Name: Linda Blackburn MRN: [id number] DOB: 01/16/1951 Today's Date: 11/24/2018    History of present illness 68 yo female admitted with recurrent pancreatitis. Hx of cholecystectomy, pancreatitis, obesity, anxiety, depression   OT comments  Goals met- will DC from OT  Follow Up Recommendations  No OT follow up(likely)    Equipment Recommendations  None recommended by OT    Recommendations for Other Services      Precautions / Restrictions Precautions Precautions: Fall       Mobility Bed Mobility Overal bed mobility: Modified Independent                Transfers Overall transfer level: Modified independent                    Balance Overall balance assessment: Mild deficits observed, not formally tested                                         ADL either performed or assessed with clinical judgement   ADL Overall ADL's : Modified independent                                       General ADL Comments: pt needed increased time but overall mod I with ADL activity      Vision Patient Visual Report: No change from baseline     Perception     Praxis      Cognition Arousal/Alertness: Awake/alert Behavior During Therapy: WFL for tasks assessed/performed Overall Cognitive Status: Within Functional Limits for tasks assessed                                                     Pertinent Vitals/ Pain       Pain Assessment: No/denies pain     Prior Functioning/Environment              Frequency  Min 2X/week        Progress Toward Goals  OT Goals(current goals can now be found in the care plan section)  Progress towards OT goals: Goals met/education completed, patient discharged from OT     Plan All goals met and education completed, patient discharged from OT services       AM-PAC OT "6 Clicks" Daily Activity     Outcome  Measure   Help from another person eating meals?: None Help from another person taking care of personal grooming?: None Help from another person toileting, which includes using toliet, bedpan, or urinal?: None Help from another person bathing (including washing, rinsing, drying)?: None Help from another person to put on and taking off regular upper body clothing?: None Help from another person to put on and taking off regular lower body clothing?: None 6 Click Score: 24    End of Session    OT Visit Diagnosis: Unsteadiness on feet (R26.81)   Activity Tolerance Patient tolerated treatment well   Patient Left in chair;with call bell/phone within reach   Nurse Communication Mobility status        Time: 4128-7867 OT Time Calculation (min): 10 min  Charges: OT Treatments $Self Care/Home  Management : 8-22 mins  Kari Baars, [place] [phone] Office- [phone], Thereasa Parkin 11/24/2018, 2:43 PM

## 2018-11-24 NOTE — Progress Notes (Addendum)
1042  Sorbitol given.  See mar  64  Transported via w/c to xray.  1200  Returned from [employer].  1300  Patient had a moderate amount liquid yellow/green stool out.

## 2018-11-24 NOTE — Plan of Care (Signed)
  Problem: Education: Goal: Knowledge of General Education information will improve Description Including pain rating scale, medication(s)/side effects and non-pharmacologic comfort measures Outcome: Progressing   Problem: Clinical Measurements: Goal: Ability to maintain clinical measurements within normal limits will improve Outcome: Progressing Goal: Will remain free from infection Outcome: Progressing Goal: Diagnostic test results will improve Outcome: Progressing   Problem: Activity: Goal: Risk for activity intolerance will decrease Outcome: Progressing   Problem: Nutrition: Goal: Adequate nutrition will be maintained Outcome: Progressing   Problem: Pain Managment: Goal: General experience of comfort will improve Outcome: Progressing   Problem: Skin Integrity: Goal: Risk for impaired skin integrity will decrease Outcome: Progressing

## 2018-11-24 NOTE — Progress Notes (Signed)
PROGRESS NOTE    Linda Blackburn  WER:[id number] DOB: 27-May-1951 DOA: 11/19/2018 PCP: [name], MD   Brief Narrative:  Patient is a pleasant 68 year old female history of anxiety, depression, nephrolithiasis, hypertension, pancreatitis status post cholecystectomy and ERCP being followed by GI in the outpatient setting, with history of multiple recurrent episodes of pancreatitis.  Patient has been seen by gastroenterology and [facility] where she underwent EUS and ERCP in 2016 at which time pancreatic sphincterotomy and major papilla was attempted however unable to be cannulated.  Patient also seen [facility] in January 2020 for ERCP.  Patient underwent multiple tests without etiology of patient's recurrent pancreatitis. Patient presented with abdominal pain located in the epigastrium radiation to the lower part of her abdomen.  Patient with no history of gastritis and denies any heavy NSAID use or alcohol use or tobacco use.  Patient noted to have lipase levels of 3337 on admission.  Patient admitted placed on bowel rest, IV fluids and supportive care.   Assessment & Plan:   Principal Problem:   Recurrent acute pancreatitis Active Problems:   Depression, recurrent (HCC)   Adjustment disorder with mixed anxiety and depressed mood   Acute recurrent pancreatitis   Pancreatitis   Chronic insomnia   Obesity (BMI 30-39.9)   Sinus bradycardia on ECG   Basal cell carcinoma (BCC) in situ of skin  1 acute recurrent pancreatitis Questionable etiology.  Patient being followed by GI in the outpatient setting.  Patient has also been seen by gastroenterology at [facility] where she underwent EUS and ERCP in 2016 at which time pancreatic sphincterotomy and major papilla was attempted however unable to be cannulated.  Patient also seen at [facility] in January 2020 for ERCP.  Patient noted to have undergone multiple tests without etiology of patient's recurrent pancreatitis.  Lipase  levels on admission was 3337.  Lipase trending down currently at 36 from 33 from 77 from 794 from 3337.  Patient slowly improving with abdominal pain.  Patient tolerated clear liquids.  Itching resolved.  Patient with complaints of a cough with some bibasilar crackles and as such IV fluids were saline locked and patient given a dose of IV Lasix with improvement with cough and a urine output of 2.750 L. Patient was placed on a full liquid diet and tried 2 small cups of ice cream on 11/22/2018, with complaints of diarrhea, abdominal discomfort, nausea.  Placed back on a clear liquid diet which she is tolerated.  Advanced to soft diet.  Patient complaints today with nausea, abdominal discomfort and just not feeling well.  Check abdominal films.  Sorbitol p.o. x1.  Lasix 40 mg IV every 12 hours x2 doses.  Continue current pain management and supportive care. Patient's gastroenterologist, Dr. Ardis Hughs informed of patient's admission through his PA.  Continue supportive care.    2.  Adjustment disorder with mixed anxiety and depressed mood Patient noted to be on Zoloft on admission however Zoloft was initially held as patient was n.p.o.  Patient on IV Ativan as needed.  Zoloft resumed as patient was tolerating clear liquids.  Follow.   3.  Sinus bradycardia Stable.  Follow.  4.  Chronic insomnia Continue current regimen of IV Ativan as needed.   5 basal cell carcinoma Outpatient follow-up with dermatology.  6.  Gastroesophageal reflux disease PPI daily.  GI cocktail as needed.  7.  Hypokalemia Replete.   DVT prophylaxis: Lovenox Code Status: Full Family Communication; updated patient.  No family at bedside. Disposition  Plan: Likely home when clinically improved with resolution of acute pancreatitis and when tolerating oral intake.   Consultants:   None  Procedures:   Abdominal films pending  Antimicrobials:   None   Subjective: Patient states does not feel too well today.  Complaining  of some significant nausea overnight.  Decreased appetite.  Had some upper abdominal pain.  No heartburn.  No chest pain.  No shortness of breath.  Does not feel as well as she did yesterday.  Cannot remember the last time she had bowel movement.   Objective: Vitals:   11/23/18 0505 11/23/18 1404 11/23/18 2041 11/24/18 0528  BP: 137/66 (!) 141/75 (!) 119/58 (!) 151/74  Pulse: 66 63 64 62  Resp: 16 16 18 18   Temp: 98.1 F (36.7 C) (!) 97.5 F (36.4 C) 97.7 F (36.5 C) 97.6 F (36.4 C)  TempSrc:  Oral Oral Oral  SpO2: 94% 95% 97% 95%  Weight:    97.2 kg  Height:        Intake/Output Summary (Last 24 hours) at 11/24/2018 0930 Last data filed at 11/23/2018 1425 Gross per 24 hour  Intake 120 ml  Output 400 ml  Net -280 ml   Filed Weights   11/22/18 0518 11/23/18 0500 11/24/18 0528  Weight: 102 kg 97.7 kg 97.2 kg    Examination:  General exam: NAD Respiratory system: Decreasing bibasilar crackles.  No wheezes, no crackles, no rhonchi.  Speaking in full sentences.  No use of accessory muscles of respiration.   Cardiovascular system: RRR no murmurs rubs or gallops.  No JVD.  No lower extremity edema.  Gastrointestinal system: Abdomen is soft, decreased tenderness to palpation in the epigastrium and diffusely, nondistended, positive bowel sounds.  No rebound.  No guarding.  Central nervous system: Alert and oriented. No focal neurological deficits. Extremities: Symmetric 5 x 5 power. Skin: No rashes, lesions or ulcers Psychiatry: Judgement and insight appear normal. Mood & affect appropriate.     Data Reviewed: I have personally reviewed following labs and imaging studies  CBC: Recent Labs  Lab 11/19/18 1131 11/19/18 1617 11/20/18 0528 11/21/18 0523 11/23/18 0557  WBC 11.0* 8.9 7.5 8.1 5.5  NEUTROABS 8.8*  --   --   --  2.8  HGB 14.5 12.9 11.8* 11.7* 11.6*  HCT 48.2* 42.9 39.3 40.2 37.7  MCV 95.4 96.8 97.3 100.2* 93.3  PLT 235 208 191 172 782   Basic Metabolic Panel:  Recent Labs  Lab 11/19/18 1617  11/21/18 0523 11/22/18 0523 11/23/18 0557 11/23/18 0643 11/23/18 1628 11/24/18 0550  NA  --    < > 142 140 140  --  139 142  K  --    < > 3.6 3.8 2.7*  --  3.2* 3.3*  CL  --    < > 116* 115* 108  --  110 109  CO2  --    < > 18* 20* 25  --  26 24  GLUCOSE  --    < > 64* 96 76  --  122* 79  BUN  --    < > 9 6* <5*  --  <5* 7*  CREATININE 0.64   < > 0.65 0.60 0.57  --  0.51 0.57  CALCIUM  --    < > 7.8* 7.9* 8.3*  --  8.1* 8.4*  MG 2.0  --   --   --   --  1.9  --   --   PHOS 2.5  --   --   --   --   --   --   --    < > =  values in this interval not displayed.   GFR: Estimated Creatinine Clearance: 72.2 mL/min (by C-G formula based on SCr of 0.57 mg/dL). Liver Function Tests: Recent Labs  Lab 11/19/18 1131 11/21/18 0523  AST 23 18  ALT 22 18  ALKPHOS 89 75  BILITOT 0.6 1.7*  PROT 7.6 6.0*  ALBUMIN 4.3 3.3*   Recent Labs  Lab 11/20/18 0528 11/21/18 0523 11/22/18 0523 11/23/18 0557 11/24/18 0550  LIPASE 794* 77* 33 26 36   No results for input(s): AMMONIA in the last 168 hours. Coagulation Profile: No results for input(s): INR, PROTIME in the last 168 hours. Cardiac Enzymes: No results for input(s): CKTOTAL, CKMB, CKMBINDEX, TROPONINI in the last 168 hours. BNP (last 3 results) No results for input(s): PROBNP in the last 8760 hours. HbA1C: No results for input(s): HGBA1C in the last 72 hours. CBG: No results for input(s): GLUCAP in the last 168 hours. Lipid Profile: No results for input(s): CHOL, HDL, LDLCALC, TRIG, CHOLHDL, LDLDIRECT in the last 72 hours. Thyroid Function Tests: No results for input(s): TSH, T4TOTAL, FREET4, T3FREE, THYROIDAB in the last 72 hours. Anemia Panel: No results for input(s): VITAMINB12, FOLATE, FERRITIN, TIBC, IRON, RETICCTPCT in the last 72 hours. Sepsis Labs: No results for input(s): PROCALCITON, LATICACIDVEN in the last 168 hours.  No results found for this or any previous visit (from the past  240 hour(s)).       Radiology Studies: No results found.      Scheduled Meds: . enoxaparin (LOVENOX) injection  40 mg Subcutaneous Q24H  . furosemide  40 mg Intravenous Q12H  . pantoprazole  40 mg Oral Q0600  . potassium chloride  40 mEq Oral Q4H  . sertraline  100 mg Oral Daily  . sorbitol  30 mL Oral Once   Continuous Infusions:    LOS: 5 days    Time spent: 35 minutes    Irine Seal, MD [facility]  If 7PM-7AM, please contact night-coverage [email or web address] 11/24/2018, 9:30 AM

## 2018-11-25 DIAGNOSIS — E876 Hypokalemia: Secondary | ICD-10-CM

## 2018-11-25 DIAGNOSIS — K219 Gastro-esophageal reflux disease without esophagitis: Secondary | ICD-10-CM

## 2018-11-25 LAB — BASIC METABOLIC PANEL
Anion gap: 8 (ref 5–15)
BUN: 8 mg/dL (ref 8–23)
CO2: 30 mmol/L (ref 22–32)
Calcium: 8.8 mg/dL — ABNORMAL LOW (ref 8.9–10.3)
Chloride: 102 mmol/L (ref 98–111)
Creatinine, Ser: 0.69 mg/dL (ref 0.44–1.00)
GFR calc non Af Amer: >60 mL/min (ref >60)
Glucose, Bld: 93 mg/dL (ref 70–99)
Potassium: 3.2 mmol/L — ABNORMAL LOW (ref 3.5–5.1)
Sodium: 140 mmol/L (ref 135–145)

## 2018-11-25 LAB — CBC
HEMATOCRIT: 40.8 % (ref 36.0–46.0)
Hemoglobin: 12.8 g/dL (ref 12.0–15.0)
MCH: 29.2 pg (ref 26.0–34.0)
MCHC: 31.4 g/dL (ref 30.0–36.0)
MCV: 92.9 fL (ref 80.0–100.0)
Platelets: 237 10*3/uL (ref 150–400)
RBC: 4.39 MIL/uL (ref 3.87–5.11)
RDW: 13.4 % (ref 11.5–15.5)
WBC: 4.9 10*3/uL (ref 4.0–10.5)
nRBC: 0 % (ref 0.0–0.2)

## 2018-11-25 MED ORDER — POTASSIUM CHLORIDE CRYS ER 20 MEQ PO TBCR
40.0000 meq | EXTENDED_RELEASE_TABLET | ORAL | Status: AC
  Administered 2018-11-25 (×2): 40 meq via ORAL
  Filled 2018-11-25 (×2): qty 2

## 2018-11-25 MED ORDER — PANTOPRAZOLE SODIUM 40 MG PO TBEC: 40.0000 mg | DELAYED_RELEASE_TABLET | Freq: Every day | ORAL | 0 refills | Status: DC

## 2018-11-25 NOTE — Discharge Instructions (Signed)
Pancreatitis Eating Plan Pancreatitis is when your pancreas becomes irritated and swollen (inflamed). The pancreas is a small organ located behind your stomach. It helps your body digest food and regulate your blood sugar. Pancreatitis can affect how your body digests food, especially foods with fat. You may also have other symptoms such as abdominal pain or nausea. When you have pancreatitis, following a low-fat eating plan may help you manage symptoms and recover more quickly. Work with your health care provider or a diet and nutrition specialist (dietitian) to create an eating plan that is right for you. What are tips for following this plan? Reading food labels Use the information on food labels to help keep track of how much fat you eat:  Check the serving size.  Look for the amount of total fat in grams (g) in one serving. ? Low-fat foods have 3 g of fat or less per serving. ? Fat-free foods have 0.5 g of fat or less per serving.  Keep track of how much fat you eat based on how many servings you eat. ? For example, if you eat two servings, the amount of fat you eat will be two times what is listed on the label. Shopping   Buy low-fat or nonfat foods, such as: ? Fresh, frozen, or canned fruits and vegetables. ? Grains, including pasta, bread, and rice. ? Lean meat, poultry, fish, and other protein foods. ? Low-fat or nonfat dairy.  Avoid buying bakery products and other sweets made with whole milk, butter, and eggs.  Avoid buying snack foods with added fat, such as anything with butter or cheese flavoring. Cooking  Remove skin from poultry, and remove extra fat from meat.  Limit the amount of fat and oil you use to 6 teaspoons or less per day.  Cook using low-fat methods, such as boiling, broiling, grilling, steaming, or baking.  Use spray oil to cook. Add fat-free chicken broth to add flavor and moisture.  Avoid adding cream to thicken soups or sauces. Use other thickeners  such as corn starch or tomato paste. Meal planning   Eat a low-fat diet as told by your dietitian. For most people, this means having no more than 55-65 grams of fat each day.  Eat small, frequent meals throughout the day. For example, you may have 5-6 small meals instead of 3 large meals.  Drink enough fluid to keep your urine pale yellow.  Do not drink alcohol. Talk to your health care provider if you need help stopping.  Limit how much caffeine you have, including black coffee, black and green tea, caffeinated soft drinks, and energy drinks. General information  Let your health care provider or dietitian know if you have unplanned weight loss on this eating plan.  You may be instructed to follow a clear liquid diet during a flare of symptoms. Talk with your health care provider about how to manage your diet during symptoms of a flare.  Take any vitamins or supplements as told by your health care provider.  Work with a Microbiologist, especially if you have other conditions such as obesity or diabetes mellitus. What foods should I avoid? Fruits Fried fruits. Fruits served with butter or cream. Vegetables Fried vegetables. Vegetables cooked with butter, cheese, or cream. Grains Biscuits, waffles, donuts, pastries, and croissants. Pies and cookies. Butter-flavored popcorn. Regular crackers. Meats and other protein foods Fatty cuts of meat. Poultry with skin. Organ meats. Bacon, sausage, and cold cuts. Whole eggs. Nuts and nut butters. Dairy Whole and  2% milk. Whole milk yogurt. Whole milk ice cream. Cream and half-and-half. Cream cheese. Sour cream. Cheese. Beverages Wine, beer, and liquor. The items listed above may not be a complete list of foods and beverages to avoid. Contact a dietitian for more information. Summary  Pancreatitis can affect how your body digests food, especially foods with fat.  When you have pancreatitis, it is recommended that you follow a low-fat eating  plan to help you recover more quickly and manage symptoms. For most people, this means limiting fat to no more than 55-65 grams per day.  Do not drink alcohol. Limit the amount of caffeine you have, and drink enough fluid to keep your urine pale yellow. This information is not intended to replace advice given to you by your health care provider. Make sure you discuss any questions you have with your health care provider. Document Released: 11/27/2017 Document Revised: 11/27/2017 Document Reviewed: 11/27/2017 Elsevier Interactive Patient Education  2019 Elsevier Inc.  Acute Pancreatitis  Acute pancreatitis happens when the pancreas gets swollen. The pancreas is a large gland behind the stomach. The pancreas helps control blood sugar. It also makes enzymes that help digest food. This condition happens when the enzymes attack the pancreas and damage it. Most attacks last a couple of days and are dangerous. The lungs, heart, and kidneys may stop working. What are the causes?  Alcohol abuse.  Drug abuse.  Gallstones.  Some medicines.  Some chemicals.  Infection.  Damage caused by an accident.Linda Blackburn (abdominal) surgery.  In some cases, the cause is not known. What are the signs or symptoms?  Pain in the upper belly and back.  Swelling of the belly  Feeling sick to your stomach (nausea) and throwing up (vomiting). How is this treated?  You will probably have to stay in the hospital. ? Treatment may include:  Fluid through an IV.  A tube to remove stomach contents and stop you from throwing up.  Not eating for 3-4 days.  Pain medicine.  Antibiotic medicines if you have an infection.  Surgery on the pancreas or gallbladder. Follow these instructions at home: Eating and drinking   Follow instructions from your doctor about diet.  Eat small meals often. Avoid eating big meals.  Eat foods that do not have a lot of fat in them.  Drink enough fluid to keep your pee  (urine) pale yellow.  Do not drink alcohol if it caused your condition. General instructions  Take over-the-counter and prescription medicines only as told by your doctor.  Do not use cigarettes, e-cigarettes, and chewing tobacco. If you need help quitting, ask your doctor.  Get plenty of rest.  If directed, check your blood sugar at home as told by your doctor.  Keep all follow-up visits as told by your doctor. This is important. Contact a doctor if:  You do not get better as quickly as expected.  You have new symptoms.  Your symptoms get worse.  You have lasting pain or weakness.  You continue to feel sick to your stomach.  You get better and then you have another pain attack.  You have a fever. Get help right away if:  You cannot eat or keep fluids down.  Your pain becomes very bad.  Your skin or the white part of your eyes turns yellow.  You throw up.  You feel dizzy or you pass out.  Your blood sugar is high (over 300 mg/dL). Summary  Acute pancreatitis happens when the pancreas  gets swollen.  This condition is usually caused by alcohol abuse, drug abuse, or gallstones.  You will probably have to stay in the hospital for treatment. This information is not intended to replace advice given to you by your health care provider. Make sure you discuss any questions you have with your health care provider. Document Released: 02/07/2008 Document Revised: 12/25/2016 Document Reviewed: 05/25/2015 Elsevier Interactive Patient Education  2019 Elsevier Inc.  Pantoprazole tablets What is this medicine? PANTOPRAZOLE (pan TOE pra zole) prevents the production of acid in the stomach. It is used to treat gastroesophageal reflux disease (GERD), inflammation of the esophagus, and Zollinger-Ellison syndrome. This medicine may be used for other purposes; ask your health care provider or pharmacist if you have questions. COMMON BRAND NAME(S): Protonix What should I tell my  health care provider before I take this medicine? They need to know if you have any of these conditions: -liver disease -low levels of magnesium in the blood -lupus -an unusual or allergic reaction to omeprazole, lansoprazole, pantoprazole, rabeprazole, other medicines, foods, dyes, or preservatives -pregnant or trying to get pregnant -breast-feeding How should I use this medicine? Take this medicine by mouth. Swallow the tablets whole with a drink of water. Follow the directions on the prescription label. Do not crush, break, or chew. Take your medicine at regular intervals. Do not take your medicine more often than directed. Talk to your pediatrician regarding the use of this medicine in children. While this drug may be prescribed for children as young as 5 years for selected conditions, precautions do apply. Overdosage: If you think you have taken too much of this medicine contact a poison control center or emergency room at once. NOTE: This medicine is only for you. Do not share this medicine with others. What if I miss a dose? If you miss a dose, take it as soon as you can. If it is almost time for your next dose, take only that dose. Do not take double or extra doses. What may interact with this medicine? Do not take this medicine with any of the following medications: -atazanavir -nelfinavir This medicine may also interact with the following medications: -ampicillin -delavirdine -erlotinib -iron salts -medicines for fungal infections like ketoconazole, itraconazole and voriconazole -methotrexate -mycophenolate mofetil -warfarin This list may not describe all possible interactions. Give your health care provider a list of all the medicines, herbs, non-prescription drugs, or dietary supplements you use. Also tell them if you smoke, drink alcohol, or use illegal drugs. Some items may interact with your medicine. What should I watch for while using this medicine? It can take several  days before your stomach pain gets better. Check with your doctor or health care professional if your condition does not start to get better, or if it gets worse. You may need blood work done while you are taking this medicine. This medicine may cause a decrease in vitamin B12. You should make sure that you get enough vitamin B12 while you are taking this medicine. Discuss the foods you eat and the vitamins you take with your health care professional. What side effects may I notice from receiving this medicine? Side effects that you should report to your doctor or health care professional as soon as possible: - allergic reactions like skin rash, itching or hives, swelling of the face, lips, or tongue - bone, muscle or joint pain - breathing problems - chest pain or chest tightness - dark yellow or brown urine - dizziness - fast, irregular heartbeat -  feeling faint or lightheaded - fever or sore throat - muscle spasm - palpitations - rash on cheeks or arms that gets worse in the sun - redness, blistering, peeling or loosening of the skin, including inside the mouth - seizures -stomach polyps - tremors - unusual bleeding or bruising - unusually weak or tired - yellowing of the eyes or skin Side effects that usually do not require medical attention (report to your doctor or health care professional if they continue or are bothersome): - constipation - diarrhea - dry mouth - headache - nausea This list may not describe all possible side effects. Call your doctor for medical advice about side effects. You may report side effects to FDA at 1-800-FDA-1088. Where should I keep my medicine? Keep out of the reach of children. Store at room temperature between 15 and 30 degrees C (59 and 86 degrees F). Protect from light and moisture. Throw away any unused medicine after the expiration date. NOTE: This sheet is a summary. It may not cover all possible information. If you  have questions about this medicine, talk to your doctor, pharmacist, or health care provider.  2019 Elsevier/Gold Standard (2017-04-06 13:51:59)

## 2018-11-25 NOTE — Care Management Important Message (Signed)
Important Message  Patient Details  Name: Linda Blackburn MRN: [id number] Date of Birth: 01/26/1951   Medicare Important Message Given:  Yes    Kerin Salen 11/25/2018, 11:42 AMImportant Message  Patient Details  Name: Linda Blackburn MRN: [id number] Date of Birth: 04/15/1951   Medicare Important Message Given:  Yes    Kerin Salen 11/25/2018, 11:41 AM

## 2018-11-25 NOTE — Discharge Summary (Signed)
Physician Discharge Summary  Linda Blackburn NAT:[id number] DOB: 22-May-1951 DOA: 11/19/2018  PCP: Linda Post, MD  Admit date: 11/19/2018 Discharge date: 11/25/2018  Time spent: 50 minutes  Recommendations for Outpatient Follow-up:  1. Follow-up with Linda Post, MD in 2 weeks.  On follow-up patient will need a basic metabolic profile done to follow-up on electrolytes and renal function. 2. Follow-up with Dr. Ardis Hughs gastroenterology in 1 week.   Discharge Diagnoses:  Principal Problem:   Recurrent acute pancreatitis Active Problems:   Depression, recurrent (HCC)   Adjustment disorder with mixed anxiety and depressed mood   Acute recurrent pancreatitis   Pancreatitis   Chronic insomnia   Obesity (BMI 30-39.9)   Sinus bradycardia on ECG   Basal cell carcinoma (BCC) in situ of skin   Discharge Condition: Stable and improved.  Diet recommendation: Regular diet.  Filed Weights   11/23/18 0500 11/24/18 0528 11/25/18 0507  Weight: 97.7 kg 97.2 kg 92.6 kg    History of present illness:  Per Dr. Reece Blackburn is a 68 y.o. WF PMHx anxiety, depression, nephrolithiasis, HTN, pancreatitis. status Blackburn cholecystectomy and ERCP   comes in with chief complaint of abdominal pain. Patient reported that she started having this pain around 7 AM this morning the pain woke her up from her sleep. Pain was located in the epigastric region and radiates down towards the lower part of her abdomen. The pain was typical of her pancreatitis type pain and she has had bilious emesis associated with it. Patient reported that typically her pain is located in the epigastric region only and does not radiate down towards the umbilicus. She has no known history of gastritis and she denies any heavy NSAID use, alcohol use or smoking. Patient additionally reported that she has had intermittent flareups every 4 to 6 months over the past year. There is no specific evoking factor, however when  patient does get pancreatitis her pain is severe and she typically requires admission for few days until better. She is well aware of the covert 19 pandemic,but her pain was so severe that she had to come to the ED for further treatment.  Hospital Course:  1 acute recurrent pancreatitis Questionable etiology.  Patient being followed by GI in the outpatient setting.  Patient has also been seen by gastroenterology at [facility] where she underwent EUS and ERCP in 2016 at which time pancreatic sphincterotomy and major papilla was attempted however unable to be cannulated.  [facility] also seen [facility] in January 2020 for ERCP.  Patient noted to have undergone multiple tests without etiology of patient's recurrent pancreatitis.  Lipase levels on admission was 3337.  Lipase trending down to 36 by day of discharge from 3337.  Patient slowly improved with abdominal pain.  Patient tolerated clear liquids.  Itching resolved.  Patient with complaints of a cough with some bibasilar crackles and as such IV fluids were saline locked and patient given a dose of IV Lasix with improvement with cough and a good urine output. of Patient was placed on a full liquid diet and tried 2 small cups of ice cream on 11/22/2018, with complaints of diarrhea, abdominal discomfort, nausea.  Placed back on a clear liquid diet which she is tolerated.  Advanced to soft diet.  Patient complaints with nausea, abdominal discomfort and just not feeling well.  Abdominal films which were done were unremarkable.  Patient was given a dose of sorbitol with good bowel movement.  Patient was placed on IV  Lasix with a urine output of 3.250 L in the 24-hour period.  Patient improved clinically and was tolerating oral diet by day of discharge.  Patient felt much better by day of discharge.  Outpatient follow-up with PCP and GI.  2.  Adjustment disorder with mixed anxiety and depressed mood Patient noted to be on Zoloft on admission however  Zoloft was initially held as patient was n.p.o.  Patient on IV Ativan as needed.  Zoloft resumed once patient was placed on a diet.  Outpatient follow-up.   3.  Sinus bradycardia Stable.  Follow.  4.  Chronic insomnia Patient initially maintained on IV Ativan as needed as she was n.p.o.  Outpatient follow-up.    5 basal cell carcinoma Outpatient follow-up with dermatology.  6.  Gastroesophageal reflux disease Patient placed on a PPI daily.  Patient also placed on GI cocktail as needed.   7.  Hypokalemia Likely secondary to diuresis.  Repleted.  Procedures:  Abdominal films 11/24/2018  Consultations:  None  Discharge Exam: Vitals:   11/24/18 2016 11/25/18 0507  BP: 130/89 121/67  Pulse: 60 (!) 54  Resp: 19 16  Temp: 97.9 F (36.6 C) 97.7 F (36.5 C)  SpO2: 100% 95%    General: NAD Cardiovascular: RRR Respiratory: CTAB  Discharge Instructions   Discharge Instructions    Diet general   Complete by:  As directed    Increase activity slowly   Complete by:  As directed      Allergies as of 11/25/2018      Reactions   Other Hives, Swelling   AQUASONIC [place] GEL   Buprenorphine Hcl    "In a scarry movie" weird thoughts   Codeine    GI upset   Morphine And Related    "In a scarry movie" weird thoughts      Medication List    TAKE these medications   cetirizine 10 MG tablet Commonly known as:  ZYRTEC Take 10 mg by mouth daily as needed for allergies.   famotidine 20 MG tablet Commonly known as:  PEPCID Take 20 mg by mouth daily.   fluconazole 100 MG tablet Commonly known as:  DIFLUCAN Take 1 tablet (100 mg total) by mouth as needed.   HYDROcodone-acetaminophen 5-325 MG tablet Commonly known as:  NORCO/VICODIN Take 1 tablet by mouth every 6 (six) hours as needed for moderate pain.   losartan 100 MG tablet Commonly known as:  COZAAR Take 1 tablet (100 mg total) by mouth daily.   naproxen sodium 220 MG tablet Commonly known as:  ALEVE Take  440 mg by mouth daily as needed (pain).   ondansetron 4 MG disintegrating tablet Commonly known as:  ZOFRAN-ODT Take 1 tablet (4 mg total) by mouth every 8 (eight) hours as needed.   pantoprazole 40 MG tablet Commonly known as:  PROTONIX Take 1 tablet (40 mg total) by mouth daily at 6 (six) AM. Start taking on:  November 26, 2018   sertraline 100 MG tablet Commonly known as:  Zoloft Take 1 tablet (100 mg total) by mouth daily.   zolpidem 5 MG tablet Commonly known as:  AMBIEN Take 1 tablet (5 mg total) by mouth at bedtime as needed for sleep.      Allergies  Allergen Reactions  . Other Hives and Swelling    AQUASONIC [place] GEL  . Buprenorphine Hcl     "In a scarry movie" weird thoughts  . Codeine     GI upset  . Morphine And Related     "  In a scarry movie" weird thoughts   Follow-up Information    Burchette, Alinda Sierras, MD. Schedule an appointment as soon as possible for a visit in 2 week(s).   Specialty:  Family Medicine Why:  f/u in 1-2 weeks. Contact information: [place] [address] [phone]        Milus Banister, MD. Schedule an appointment as soon as possible for a visit in 1 week(s).   Specialty:  Gastroenterology Contact information: [address] [address] [phone]            The results of significant diagnostics from this hospitalization (including imaging, microbiology, ancillary and laboratory) are listed below for reference.    Significant Diagnostic Studies: Dg Abd 2 Views  Result Date: 11/24/2018 CLINICAL DATA:  Recent cholecystectomy and ERCP, history of multiple recurrent episodes of pancreatitis. Patient presented with abdominal pain located in the epigastrium radiation to the lower part of her abdomen. History of hypertension. EXAM: ABDOMEN - 2 VIEW COMPARISON:  Plain film of the abdomen dated 03/07/2018. FINDINGS: Bowel gas pattern is nonobstructive. No evidence of soft tissue mass or abnormal  fluid collection. No evidence of free intraperitoneal air. Presumed cholecystectomy clips in the RIGHT upper quadrant. No evidence of renal or ureteral calculi. Visualized osseous structures are unremarkable. IMPRESSION: Nonobstructive bowel gas pattern and no evidence of acute intra-abdominal abnormality. Electronically Signed   By: Franki Cabot M.D.   On: 11/24/2018 13:04    Microbiology: No results found for this or any previous visit (from the past 240 hour(s)).   Labs: Basic Metabolic Panel: Recent Labs  Lab 11/19/18 1617  11/22/18 0523 11/23/18 0557 11/23/18 2993 11/23/18 1628 11/24/18 0550 11/25/18 0522  NA  --    < > 140 140  --  139 142 140  K  --    < > 3.8 2.7*  --  3.2* 3.3* 3.2*  CL  --    < > 115* 108  --  110 109 102  CO2  --    < > 20* 25  --  26 24 30   GLUCOSE  --    < > 96 76  --  122* 79 93  BUN  --    < > 6* <5*  --  <5* 7* 8  CREATININE 0.64   < > 0.60 0.57  --  0.51 0.57 0.69  CALCIUM  --    < > 7.9* 8.3*  --  8.1* 8.4* 8.8*  MG 2.0  --   --   --  1.9  --   --   --   PHOS 2.5  --   --   --   --   --   --   --    < > = values in this interval not displayed.   Liver Function Tests: Recent Labs  Lab 11/19/18 1131 11/21/18 0523  AST 23 18  ALT 22 18  ALKPHOS 89 75  BILITOT 0.6 1.7*  PROT 7.6 6.0*  ALBUMIN 4.3 3.3*   Recent Labs  Lab 11/20/18 0528 11/21/18 0523 11/22/18 0523 11/23/18 0557 11/24/18 0550  LIPASE 794* 77* 33 26 36   No results for input(s): AMMONIA in the last 168 hours. CBC: Recent Labs  Lab 11/19/18 1131 11/19/18 1617 11/20/18 0528 11/21/18 0523 11/23/18 0557 11/25/18 0522  WBC 11.0* 8.9 7.5 8.1 5.5 4.9  NEUTROABS 8.8*  --   --   --  2.8  --  HGB 14.5 12.9 11.8* 11.7* 11.6* 12.8  HCT 48.2* 42.9 39.3 40.2 37.7 40.8  MCV 95.4 96.8 97.3 100.2* 93.3 92.9  PLT 235 208 191 172 216 237   Cardiac Enzymes: No results for input(s): CKTOTAL, CKMB, CKMBINDEX, TROPONINI in the last 168 hours. BNP: BNP (last 3 results) Recent  Labs    01/01/18 1208  BNP 235.8*    ProBNP (last 3 results) No results for input(s): PROBNP in the last 8760 hours.  CBG: No results for input(s): GLUCAP in the last 168 hours.     Signed:  Irine Seal MD.  [facility] 11/25/2018, 11:50 AM

## 2018-11-25 NOTE — TOC Transition Note (Signed)
Transition of Care [facility]) - CM/SW Discharge Note   Patient Details  Name: Linda Blackburn MRN: [id number] Date of Birth: [birth year]  [facility] CM/SW Contact:  Leeroy Cha, RN Phone Number: 11/25/2018, 12:48 PM   Clinical Narrative:    Discharged to home with self-care, orders checked for hhc needs. No TOC needs present at time of discharge.  Patient is able to arrangement own appointments and home care.    Final next level of care: Home/Self Care Barriers to Discharge: No Barriers Identified   Patient Goals and CMS Choice Patient states their goals for this hospitalization and ongoing recovery are:: just want to go home   Choice offered to / list presented to : NA  Discharge Placement                       Discharge Plan and Services   Discharge Planning Services: CM Consult                      Social Determinants of Health (SDOH) Interventions     Readmission Risk Interventions No flowsheet data found.

## 2018-11-27 ENCOUNTER — Telehealth: Payer: Self-pay | Admitting: *Deleted

## 2018-11-27 DIAGNOSIS — N3946 Mixed incontinence: Secondary | ICD-10-CM | POA: Diagnosis not present

## 2018-11-27 DIAGNOSIS — R351 Nocturia: Secondary | ICD-10-CM | POA: Diagnosis not present

## 2018-11-27 NOTE — Telephone Encounter (Signed)
Attempted to contact patient for TCM. Left message for patient to return call. CRM created

## 2018-11-28 NOTE — Telephone Encounter (Signed)
Transition Care Management Follow-up Telephone Call   Date discharged? 3.23.20   How have you been since you were released from the hospital? "i'm okay just tender and unable to sleep well"   Do you understand why you were in the hospital? Yes "pancreatiact attack another one"   Do you understand the discharge instructions? yes   Where were you discharged to? Home    Items Reviewed:  Medications reviewed: yes  Allergies reviewed: yes  Dietary changes reviewed: yes Low Fat   Referrals reviewed: N/A   Functional Questionnaire:   Activities of Daily Living (ADLs):   She states they are independent in the following: ambulation, bathing and hygiene, feeding, continence, grooming, toileting and dressing States they require assistance with the following: N/A    Any transportation issues/concerns?: no   Any patient concerns? Yes  Having trouble sleeping    Confirmed importance and date/time of follow-up visits scheduled yes  Provider Appointment booked with  Dr. Elease Hashimoto 12/02/2018 at 3:00 PM   Confirmed with patient if condition begins to worsen call PCP or go to the ER.  Patient was given the office number and encouraged to call back with question or concerns.  : yes

## 2018-12-02 ENCOUNTER — Telehealth: Payer: Self-pay

## 2018-12-02 ENCOUNTER — Other Ambulatory Visit: Payer: Self-pay

## 2018-12-02 ENCOUNTER — Ambulatory Visit (INDEPENDENT_AMBULATORY_CARE_PROVIDER_SITE_OTHER): Payer: Medicare Other | Admitting: Family Medicine

## 2018-12-02 DIAGNOSIS — K859 Acute pancreatitis without necrosis or infection, unspecified: Secondary | ICD-10-CM

## 2018-12-02 DIAGNOSIS — B3731 Acute candidiasis of vulva and vagina: Secondary | ICD-10-CM

## 2018-12-02 DIAGNOSIS — E876 Hypokalemia: Secondary | ICD-10-CM | POA: Diagnosis not present

## 2018-12-02 DIAGNOSIS — R238 Other skin changes: Secondary | ICD-10-CM

## 2018-12-02 DIAGNOSIS — B373 Candidiasis of vulva and vagina: Secondary | ICD-10-CM

## 2018-12-02 DIAGNOSIS — K219 Gastro-esophageal reflux disease without esophagitis: Secondary | ICD-10-CM

## 2018-12-02 MED ORDER — FLUCONAZOLE 100 MG PO TABS: ORAL_TABLET | ORAL | 0 refills | Status: DC

## 2018-12-02 NOTE — Telephone Encounter (Signed)
Called patient and I have set up the WebEx appointment today and patient verbalized an understanding.  Copied from [place] [id number]. Topic: Quick Sport and exercise psychologist Patient (Clinic Use ONLY) >> Dec 02, 2018  9:44 AM Linda Blackburn, CMA wrote: Reason for CRM: Called patient today and left a detailed voice message for her to confirm her email address and to make sure a WebEx appointment is okay.  OK for PEC to discuss and let us know email is confirmed so we can set up WebEx for patient. >> Dec 02, 2018 10:03 AM Linda Blackburn wrote: email confirmed Also let dr know she thinks she has shingles again, on top part of her butt

## 2018-12-02 NOTE — Progress Notes (Signed)
Patient ID: Linda Blackburn, female   DOB: [birth year], 68 y.o.   MRN: [id number]  Virtual Visit via Video Note  I connected with Linda Blackburn on 12/02/18 at  3:00 PM EDT by a video enabled telemedicine application and verified that I am speaking with the correct person using two identifiers.  Location patient: home Location provider:work or home office Persons participating in the virtual visit: patient, provider  I discussed the limitations of evaluation and management by telemedicine and the availability of in person appointments. The patient expressed understanding and agreed to proceed.   HPI: This is a transitional care management follow-up regarding recent hospitalization.  Patient has history of recurrent idiopathic pancreatitis, hypertension, GERD, urinary urgency and stress incontinence, obesity, history of recurrent depression, and chronic insomnia.  She was recently admitted on 17 March with recurrent nausea and vomiting secondary to recurrent pancreatitis.  Marland Kitchen  She was discharged on the 23rd.  We discussed the following issues  She was given Dilaudid for pain and had significant itching.  Her lipase was over 3000.  She feels back to baseline at this time.  She has had extensive GI work-up in the past and went to specialty clinic up in [place] and no other interventions recommended.  Patient has had some GERD symptoms and was prescribed Protonix by hospitalist but she never started that.  She is currently on Pepcid and is having frequent breakthrough GERD symptoms especially at night.  She tries to avoid eating before bedtime.  No dysphagia.  Patient has had history of recurrent yeast vaginitis.  Requesting refills of fluconazole.  She also relates painful vesicular rash right buttocks.  She states she was diagnosed with shingles last summer and states this rash is very similar.  Onset of rash was Thursday.  Pain is relatively mild.  No itching.  Patient had a hypokalemia with potassium  2.7 on recent admission.  This was 3.2 at discharge.  She does not take any diuretics.  ROS: See pertinent positives and negatives per HPI.  Past Medical History:  Diagnosis Date  . Abnormal uterine bleeding   . Amenorrhea   . Anxiety   . Depression   . Gallstones   . Hay fever   . HTN (hypertension)   . Pancreatitis   . Urine incontinence   . UTI (urinary tract infection)    reoccuring     Past Surgical History:  Procedure Laterality Date  . ABDOMINAL HYSTERECTOMY  1984   fibroids  . APPENDECTOMY  1984  . CHOLECYSTECTOMY  09/08/1999  . ERCP W/ SPHINCTEROTOMY AND BALLOON DILATION  08/2008  . OOPHORECTOMY     Only 1 was taken  . TONSILLECTOMY    . TUBAL LIGATION      Family History  Problem Relation Age of Onset  . Alcohol abuse Mother   . Prostate cancer Father   . Stroke Maternal Aunt   . Hiatal hernia Maternal Aunt   . Prostate cancer Paternal Grandfather   . Cerebral palsy Granddaughter   . Breast cancer Neg Hx     SOCIAL HX: Non-smoker.  No alcohol.  Patient is widowed.  Son lives in [place].   Current Outpatient Medications:  .  cetirizine (ZYRTEC) 10 MG tablet, Take 10 mg by mouth daily as needed for allergies. , Disp: , Rfl:  .  famotidine (PEPCID) 20 MG tablet, Take 20 mg by mouth daily. , Disp: , Rfl:  .  fluconazole (DIFLUCAN) 100 MG tablet, Take one tablet by mouth daily as  needed., Disp: 12 tablet, Rfl: 0 .  HYDROcodone-acetaminophen (NORCO/VICODIN) 5-325 MG tablet, Take 1 tablet by mouth every 6 (six) hours as needed for moderate pain. (Patient not taking: Reported on 11/19/2018), Disp: 20 tablet, Rfl: 0 .  losartan (COZAAR) 100 MG tablet, Take 1 tablet (100 mg total) by mouth daily., Disp: 90 tablet, Rfl: 3 .  naproxen sodium (ALEVE) 220 MG tablet, Take 440 mg by mouth daily as needed (pain)., Disp: , Rfl:  .  ondansetron (ZOFRAN-ODT) 4 MG disintegrating tablet, Take 1 tablet (4 mg total) by mouth every 8 (eight) hours as needed., Disp: 90 tablet,  Rfl: 1 .  pantoprazole (PROTONIX) 40 MG tablet, Take 1 tablet (40 mg total) by mouth daily at 6 (six) AM., Disp: 30 tablet, Rfl: 0 .  sertraline (ZOLOFT) 100 MG tablet, Take 1 tablet (100 mg total) by mouth daily., Disp: 90 tablet, Rfl: 3 .  zolpidem (AMBIEN) 5 MG tablet, Take 1 tablet (5 mg total) by mouth at bedtime as needed for sleep., Disp: 30 tablet, Rfl: 1  EXAM:  VITALS per patient if applicable:  GENERAL: alert, oriented, appears well and in no acute distress  HEENT: atraumatic, conjunttiva clear, no obvious abnormalities on inspection of external nose and ears  NECK: normal movements of the head and neck  LUNGS: on inspection no signs of respiratory distress, breathing rate appears normal, no obvious gross SOB, gasping or wheezing  CV: no obvious cyanosis  MS: moves all visible extremities without noticeable abnormality  PSYCH/NEURO: pleasant and cooperative, no obvious depression or anxiety, speech and thought processing grossly intact  ASSESSMENT AND PLAN:  Discussed the following assessment and plan:  #1 recurrent idiopathic pancreatitis.  Currently stable symptomatically  #2 GERD symptoms.  She is having some breakthrough's symptoms in spite of regular Pepcid -Avoid eating within 2 to 3 hours of bedtime -Consider elevate head of bed 4 to 6 inches -Consider going ahead and starting Protonix 40 mg daily for 1 month and then try to transition back to Pepcid  #3 recurrent vesicular painful rash right buttocks.  Question is whether this could be herpetic versus shingles.  We decided not to prescribe Valtrex as she is several days into her rash.  Pain is fairly stable -Observe for now  #4 history of recurrent yeast vaginitis  -Refilled fluconazole for as needed use  #5 hypokalemia probably related to her recent acute pancreatitis -Discussed high potassium foods to focus on     I discussed the assessment and treatment plan with the patient. The patient was  provided an opportunity to ask questions and all were answered. The patient agreed with the plan and demonstrated an understanding of the instructions.   The patient was advised to call back or seek an in-person evaluation if the symptoms worsen or if the condition fails to improve as anticipated.  I provided 25 minutes of non-face-to-face time during this encounter.   Carolann Littler, MD

## 2018-12-02 NOTE — Telephone Encounter (Signed)
Called patient today and left a detailed voice message for her to confirm her email address and to make sure a WebEx appointment is okay.  OK for PEC to discuss and let us know email is confirmed so we can set up WebEx for patient.  CRM Created.

## 2019-01-02 ENCOUNTER — Telehealth: Payer: Self-pay | Admitting: Family Medicine

## 2019-01-02 DIAGNOSIS — N3946 Mixed incontinence: Secondary | ICD-10-CM | POA: Diagnosis not present

## 2019-01-02 DIAGNOSIS — R350 Frequency of micturition: Secondary | ICD-10-CM | POA: Diagnosis not present

## 2019-01-02 NOTE — Telephone Encounter (Signed)
Copied from [place] [phone]. Topic: Quick Communication - Rx Refill/Question >> Jan 02, 2019  3:47 PM Rayann Heman wrote: Medication: pantoprazole (PROTONIX) 40 MG tablet [[id number]]   Has the patient contacted their pharmacy? No Preferred Pharmacy (with phone number or street name): [facility] [place] [phone] (Phone) [phone] (Fax)    Agent: Please be advised that RX refills may take up to 3 business days. We ask that you follow-up with your pharmacy.

## 2019-01-02 NOTE — Telephone Encounter (Signed)
Corrected pharmacy  [facility] [place] [address] [place] (Phone) [phone] (Fax)

## 2019-01-03 ENCOUNTER — Other Ambulatory Visit: Payer: Self-pay

## 2019-01-03 MED ORDER — PANTOPRAZOLE SODIUM 40 MG PO TBEC: 40.0000 mg | DELAYED_RELEASE_TABLET | Freq: Every day | ORAL | 1 refills | Status: DC

## 2019-01-03 NOTE — Telephone Encounter (Signed)
Refill for 6 months. 

## 2019-01-03 NOTE — Telephone Encounter (Signed)
This prescription was filled on 11/26/18, # 30 with 0 refill by Dr. Eugenie Filler, MD  OK to fill for patient?

## 2019-01-07 NOTE — Telephone Encounter (Signed)
I refilled Rx on 01/03/19.

## 2019-04-03 ENCOUNTER — Other Ambulatory Visit: Payer: Self-pay

## 2019-04-17 DIAGNOSIS — H2513 Age-related nuclear cataract, bilateral: Secondary | ICD-10-CM | POA: Diagnosis not present

## 2019-04-17 DIAGNOSIS — H5203 Hypermetropia, bilateral: Secondary | ICD-10-CM | POA: Diagnosis not present

## 2019-04-17 DIAGNOSIS — H524 Presbyopia: Secondary | ICD-10-CM | POA: Diagnosis not present

## 2019-05-06 ENCOUNTER — Other Ambulatory Visit: Payer: Self-pay | Admitting: Family Medicine

## 2019-05-06 DIAGNOSIS — Z1231 Encounter for screening mammogram for malignant neoplasm of breast: Secondary | ICD-10-CM

## 2019-05-26 ENCOUNTER — Encounter (HOSPITAL_COMMUNITY): Payer: Self-pay | Admitting: Emergency Medicine

## 2019-05-26 ENCOUNTER — Other Ambulatory Visit: Payer: Self-pay

## 2019-05-26 ENCOUNTER — Inpatient Hospital Stay (HOSPITAL_COMMUNITY)
Admission: EM | Admit: 2019-05-26 | Discharge: 2019-05-31 | DRG: 439 | Disposition: A | Discharge status: Home / Self Care | Payer: Medicare Other | Attending: Internal Medicine | Admitting: Internal Medicine

## 2019-05-26 DIAGNOSIS — L299 Pruritus, unspecified: Secondary | ICD-10-CM | POA: Diagnosis present

## 2019-05-26 DIAGNOSIS — Z823 Family history of stroke: Secondary | ICD-10-CM

## 2019-05-26 DIAGNOSIS — E876 Hypokalemia: Secondary | ICD-10-CM | POA: Diagnosis present

## 2019-05-26 DIAGNOSIS — Z888 Allergy status to other drugs, medicaments and biological substances status: Secondary | ICD-10-CM

## 2019-05-26 DIAGNOSIS — K859 Acute pancreatitis without necrosis or infection, unspecified: Secondary | ICD-10-CM | POA: Diagnosis not present

## 2019-05-26 DIAGNOSIS — Z79899 Other long term (current) drug therapy: Secondary | ICD-10-CM

## 2019-05-26 DIAGNOSIS — F419 Anxiety disorder, unspecified: Secondary | ICD-10-CM | POA: Diagnosis present

## 2019-05-26 DIAGNOSIS — K219 Gastro-esophageal reflux disease without esophagitis: Secondary | ICD-10-CM | POA: Diagnosis present

## 2019-05-26 DIAGNOSIS — K851 Biliary acute pancreatitis without necrosis or infection: Secondary | ICD-10-CM | POA: Diagnosis not present

## 2019-05-26 DIAGNOSIS — Z79891 Long term (current) use of opiate analgesic: Secondary | ICD-10-CM

## 2019-05-26 DIAGNOSIS — K760 Fatty (change of) liver, not elsewhere classified: Secondary | ICD-10-CM | POA: Diagnosis present

## 2019-05-26 DIAGNOSIS — Z20828 Contact with and (suspected) exposure to other viral communicable diseases: Secondary | ICD-10-CM | POA: Diagnosis not present

## 2019-05-26 DIAGNOSIS — Z885 Allergy status to narcotic agent status: Secondary | ICD-10-CM

## 2019-05-26 DIAGNOSIS — K8051 Calculus of bile duct without cholangitis or cholecystitis with obstruction: Secondary | ICD-10-CM | POA: Diagnosis not present

## 2019-05-26 DIAGNOSIS — I959 Hypotension, unspecified: Secondary | ICD-10-CM | POA: Diagnosis present

## 2019-05-26 DIAGNOSIS — Z9049 Acquired absence of other specified parts of digestive tract: Secondary | ICD-10-CM

## 2019-05-26 DIAGNOSIS — I10 Essential (primary) hypertension: Secondary | ICD-10-CM | POA: Diagnosis present

## 2019-05-26 DIAGNOSIS — Z811 Family history of alcohol abuse and dependence: Secondary | ICD-10-CM

## 2019-05-26 DIAGNOSIS — R109 Unspecified abdominal pain: Secondary | ICD-10-CM

## 2019-05-26 DIAGNOSIS — K5730 Diverticulosis of large intestine without perforation or abscess without bleeding: Secondary | ICD-10-CM | POA: Diagnosis not present

## 2019-05-26 DIAGNOSIS — K317 Polyp of stomach and duodenum: Secondary | ICD-10-CM | POA: Diagnosis present

## 2019-05-26 DIAGNOSIS — Z6838 Body mass index (BMI) 38.0-38.9, adult: Secondary | ICD-10-CM

## 2019-05-26 DIAGNOSIS — Z791 Long term (current) use of non-steroidal anti-inflammatories (NSAID): Secondary | ICD-10-CM

## 2019-05-26 DIAGNOSIS — Z66 Do not resuscitate: Secondary | ICD-10-CM | POA: Diagnosis not present

## 2019-05-26 DIAGNOSIS — R17 Unspecified jaundice: Secondary | ICD-10-CM

## 2019-05-26 DIAGNOSIS — K8050 Calculus of bile duct without cholangitis or cholecystitis without obstruction: Secondary | ICD-10-CM

## 2019-05-26 DIAGNOSIS — Z8042 Family history of malignant neoplasm of prostate: Secondary | ICD-10-CM

## 2019-05-26 DIAGNOSIS — F339 Major depressive disorder, recurrent, unspecified: Secondary | ICD-10-CM | POA: Diagnosis present

## 2019-05-26 DIAGNOSIS — Z03818 Encounter for observation for suspected exposure to other biological agents ruled out: Secondary | ICD-10-CM | POA: Diagnosis not present

## 2019-05-26 DIAGNOSIS — Z90710 Acquired absence of both cervix and uterus: Secondary | ICD-10-CM

## 2019-05-26 LAB — COMPREHENSIVE METABOLIC PANEL
ALT: 372 U/L — ABNORMAL HIGH (ref 0–44)
AST: 231 U/L — ABNORMAL HIGH (ref 15–41)
Albumin: 4.1 g/dL (ref 3.5–5.0)
Alkaline Phosphatase: 243 U/L — ABNORMAL HIGH (ref 38–126)
Anion gap: 10 (ref 5–15)
BUN: 13 mg/dL (ref 8–23)
CO2: 24 mmol/L (ref 22–32)
Calcium: 9.3 mg/dL (ref 8.9–10.3)
Chloride: 105 mmol/L (ref 98–111)
Creatinine, Ser: 0.94 mg/dL (ref 0.44–1.00)
GFR calc Af Amer: >60 mL/min (ref >60)
GFR calc non Af Amer: >60 mL/min (ref >60)
Glucose, Bld: 116 mg/dL — ABNORMAL HIGH (ref 70–99)
Potassium: 3.4 mmol/L — ABNORMAL LOW (ref 3.5–5.1)
Sodium: 139 mmol/L (ref 135–145)
Total Bilirubin: 4.2 mg/dL — ABNORMAL HIGH (ref 0.3–1.2)
Total Protein: 7.6 g/dL (ref 6.5–8.1)

## 2019-05-26 LAB — CBC
HCT: 46.1 % — ABNORMAL HIGH (ref 36.0–46.0)
Hemoglobin: 14.7 g/dL (ref 12.0–15.0)
MCH: 29.7 pg (ref 26.0–34.0)
MCHC: 31.9 g/dL (ref 30.0–36.0)
MCV: 93.1 fL (ref 80.0–100.0)
Platelets: 233 10*3/uL (ref 150–400)
RBC: 4.95 MIL/uL (ref 3.87–5.11)
RDW: 13.9 % (ref 11.5–15.5)
WBC: 7.4 10*3/uL (ref 4.0–10.5)
nRBC: 0 % (ref 0.0–0.2)

## 2019-05-26 LAB — LIPASE, BLOOD: Lipase: 29 U/L (ref 11–51)

## 2019-05-26 MED ORDER — ONDANSETRON 4 MG PO TBDP
4.0000 mg | ORAL_TABLET | Freq: Once | ORAL | Status: AC | PRN
  Administered 2019-05-26: 4 mg via ORAL
  Filled 2019-05-26: qty 1

## 2019-05-26 MED ORDER — METOCLOPRAMIDE HCL 5 MG/ML IJ SOLN
10.0000 mg | Freq: Once | INTRAMUSCULAR | Status: DC
  Filled 2019-05-26: qty 2

## 2019-05-26 MED ORDER — FENTANYL CITRATE (PF) 100 MCG/2ML IJ SOLN
50.0000 ug | Freq: Once | INTRAMUSCULAR | Status: DC
  Administered 2019-05-27: 50 ug via INTRAVENOUS
  Filled 2019-05-26: qty 2

## 2019-05-26 MED ORDER — SODIUM CHLORIDE 0.9% FLUSH
3.0000 mL | Freq: Once | INTRAVENOUS | Status: AC
  Administered 2019-05-27: 3 mL via INTRAVENOUS

## 2019-05-26 NOTE — ED Provider Notes (Signed)
[facility] DEPT Provider Note   CSN: [id number] Arrival date & time: 05/26/19  2032     History   Chief Complaint Chief Complaint  Patient presents with   Abdominal Pain    HPI Linda Blackburn is a 68 y.o. female.     Patient presents to the emergency department for evaluation of abdominal pain with nausea and vomiting.  Symptoms ongoing for 4 days.  Pain is in the upper abdomen and radiates into her back.  Patient reports that this is similar to multiple episodes of pancreatitis she has had in the past.  Patient reports being to multiple specialists and being told that there is no identifiable cause for her episodes of pancreatitis.  Patient reports constant moderate to severe pain in the mid upper abdomen.  She does report that when symptoms began she had some itching all over but did not identify any rash.  This has not happened previously.     Past Medical History:  Diagnosis Date   Abnormal uterine bleeding    Amenorrhea    Anxiety    Depression    Gallstones    Hay fever    HTN (hypertension)    Pancreatitis    Urine incontinence    UTI (urinary tract infection)    reoccuring     Patient Active Problem List   Diagnosis Date Noted   Gastroesophageal reflux disease    Basal cell carcinoma (BCC) in situ of skin 10/11/2018   Stress incontinence 10/11/2018   Hypotension (arterial) 08/09/2018   Sinus bradycardia on ECG 08/09/2018   AKI (acute kidney injury) ([place]) 08/09/2018   Recurrent acute pancreatitis 08/08/2018   Nausea & vomiting 03/07/2018   Sepsis ([place]) 01/01/2018   Alternating constipation and diarrhea 09/26/2016   Obesity (BMI 30-39.9) 07/20/2014   Chronic insomnia 06/16/2014   Hypokalemia 06/10/2014   Volume overload 06/10/2014   UTI (urinary tract infection) 06/10/2014   Leukocytosis 06/10/2014   Acute respiratory failure with hypoxia ([place]) 06/10/2014   Acute recurrent pancreatitis  06/06/2014   Pancreatitis 06/06/2014   Hypertension 09/11/2012   Adjustment disorder with mixed anxiety and depressed mood 09/11/2012   Pancreas divisum 01/18/2011   URI 09/02/2010   Depression, recurrent ([place]) 04/01/2010   PANCREATITIS, HX OF 04/01/2010    Past Surgical History:  Procedure Laterality Date   ABDOMINAL HYSTERECTOMY  1984   fibroids   APPENDECTOMY  1984   CHOLECYSTECTOMY  09/08/1999   ERCP W/ SPHINCTEROTOMY AND BALLOON DILATION  08/2008   OOPHORECTOMY     Only 1 was taken   TONSILLECTOMY     TUBAL LIGATION       OB History    Gravida  4   Para  2   Term  2   Preterm      AB  2   Living  2     SAB      TAB      Ectopic      Multiple      Live Births  2            Home Medications    Prior to Admission medications   Medication Sig Start Date End Date Taking? Authorizing Provider  HYDROcodone-acetaminophen (NORCO/VICODIN) 5-325 MG tablet Take 1 tablet by mouth every 6 (six) hours as needed for moderate pain.   Yes [provider]  losartan (COZAAR) 100 MG tablet Take 1 tablet (100 mg total) by mouth daily. 08/19/18  Yes Burchette, Alinda Sierras, MD  naproxen sodium (ALEVE) 220 MG tablet Take 440 mg by mouth daily as needed (pain).   Yes [provider]  ondansetron (ZOFRAN-ODT) 4 MG disintegrating tablet Take 1 tablet (4 mg total) by mouth every 8 (eight) hours as needed. Patient taking differently: Take 4 mg by mouth every 8 (eight) hours as needed for nausea or vomiting.  08/19/18  Yes Burchette, Alinda Sierras, MD  oxybutynin (DITROPAN-XL) 10 MG 24 hr tablet Take 10 mg by mouth daily after breakfast. 04/03/19  Yes [provider]  pantoprazole (PROTONIX) 40 MG tablet Take 1 tablet (40 mg total) by mouth daily at 6 (six) AM. 01/03/19  Yes Burchette, Alinda Sierras, MD  sertraline (ZOLOFT) 100 MG tablet Take 1 tablet (100 mg total) by mouth daily. 08/19/18  Yes Burchette, Alinda Sierras, MD  zolpidem (AMBIEN) 5 MG tablet Take 1  tablet (5 mg total) by mouth at bedtime as needed for sleep. 08/07/16  Yes Burchette, Alinda Sierras, MD    Family History Family History  Problem Relation Age of Onset   Alcohol abuse Mother    Prostate cancer Father    Stroke Maternal Aunt    Hiatal hernia Maternal Aunt    Prostate cancer Paternal Grandfather    Cerebral palsy Granddaughter    Breast cancer Neg Hx     Social History Social History   Tobacco Use   Smoking status: Never Smoker   Smokeless tobacco: Never Used  Substance Use Topics   Alcohol use: Yes    Alcohol/week: 0.0 standard drinks    Comment: occ - hardly every    Drug use: No     Allergies   Other, Buprenorphine hcl, Codeine, Dilaudid [hydromorphone hcl], and Morphine and related   Review of Systems Review of Systems  Gastrointestinal: Positive for abdominal pain, nausea and vomiting.  Skin: Negative for rash.  All other systems reviewed and are negative.    Physical Exam Updated Vital Signs BP (!) 81/57    Pulse 60    Temp 98.2 F (36.8 C) (Oral)    Resp 16    Ht 5\' 2"  (1.575 m)    Wt 90.7 kg    SpO2 94%    BMI 36.58 kg/m   Physical Exam Vitals signs and nursing note reviewed.  Constitutional:      General: She is not in acute distress.    Appearance: Normal appearance. She is well-developed.  HENT:     Head: Normocephalic and atraumatic.     Right Ear: Hearing normal.     Left Ear: Hearing normal.     Nose: Nose normal.  Eyes:     Conjunctiva/sclera: Conjunctivae normal.     Pupils: Pupils are equal, round, and reactive to light.  Neck:     Musculoskeletal: Normal range of motion and neck supple.  Cardiovascular:     Rate and Rhythm: Regular rhythm.     Heart sounds: S1 normal and S2 normal. No murmur. No friction rub. No gallop.   Pulmonary:     Effort: Pulmonary effort is normal. No respiratory distress.     Breath sounds: Normal breath sounds.  Chest:     Chest wall: No tenderness.  Abdominal:     General: Bowel  sounds are normal.     Palpations: Abdomen is soft.     Tenderness: There is abdominal tenderness in the epigastric area. There is no guarding or rebound. Negative signs include Munroe's sign and McBurney's sign.     Hernia: No hernia is  present.  Musculoskeletal: Normal range of motion.  Skin:    General: Skin is warm and dry.     Findings: No rash.  Neurological:     Mental Status: She is alert and oriented to person, place, and time.     GCS: GCS eye subscore is 4. GCS verbal subscore is 5. GCS motor subscore is 6.     Cranial Nerves: No cranial nerve deficit.     Sensory: No sensory deficit.     Coordination: Coordination normal.  Psychiatric:        Speech: Speech normal.        Behavior: Behavior normal.        Thought Content: Thought content normal.      ED Treatments / Results  Labs (all labs ordered are listed, but only abnormal results are displayed) Labs Reviewed  COMPREHENSIVE METABOLIC PANEL - Abnormal; Notable for the following components:      Result Value   Potassium 3.4 (*)    Glucose, Bld 116 (*)    AST 231 (*)    ALT 372 (*)    Alkaline Phosphatase 243 (*)    Total Bilirubin 4.2 (*)    All other components within normal limits  CBC - Abnormal; Notable for the following components:   HCT 46.1 (*)    All other components within normal limits  CBC - Abnormal; Notable for the following components:   WBC 11.1 (*)    All other components within normal limits  SARS CORONAVIRUS 2 (HOSPITAL ORDER, [facility])  LIPASE, BLOOD  CREATININE, SERUM  URINALYSIS, ROUTINE W REFLEX MICROSCOPIC  HIV ANTIBODY (ROUTINE TESTING W REFLEX)    EKG EKG Interpretation  Date/Time:  Monday May 26 2019 21:20:58 EDT Ventricular Rate:  51 PR Interval:    QRS Duration: 106 QT Interval:  430 QTC Calculation: 396 R Axis:   10 Text Interpretation:  Sinus rhythm Normal ECG Confirmed by Orpah Greek [phone]) on 05/27/2019 2:38:35  AM   Radiology Ct Abdomen Pelvis W Contrast  Result Date: 05/27/2019 CLINICAL DATA:  68 year old female with generalized abdominal pain x5 days. EXAM: CT ABDOMEN AND PELVIS WITH CONTRAST TECHNIQUE: Multidetector CT imaging of the abdomen and pelvis was performed using the standard protocol following bolus administration of intravenous contrast. CONTRAST:  172mL OMNIPAQUE IOHEXOL 300 MG/ML  SOLN COMPARISON:  CT dated 08/08/2018. FINDINGS: Lower chest: The visualized lung bases are clear. No intra-abdominal free air or free fluid. Hepatobiliary: Apparent mild fatty infiltration of the liver. No intrahepatic biliary ductal dilatation. Cholecystectomy. No retained calcified stone noted in the central CBD Pancreas: Mild haziness of the peripancreatic and upper abdominal fat concerning for acute pancreatitis. Correlation with pancreatic enzymes recommended. No drainable fluid collection/abscess or pseudocyst. Spleen: Normal in size without focal abnormality. Adrenals/Urinary Tract: The adrenal glands are unremarkable. There is no hydronephrosis on either side. There is symmetric enhancement and excretion of contrast by both kidneys. The visualized ureters and urinary bladder appear unremarkable. Stomach/Bowel: There is sigmoid diverticulosis without active inflammatory changes. There is a 2 cm duodenal diverticulum. There is no bowel obstruction or active inflammation. The appendix is not visualized with certainty. No inflammatory changes identified in the right lower quadrant. Vascular/Lymphatic: Mild atherosclerotic calcification of the aorta. The IVC is unremarkable. No portal venous gas. There is no adenopathy. Reproductive: Hysterectomy. No pelvic mass. Other: Small fat containing umbilical hernia. Musculoskeletal: No acute or significant osseous findings. IMPRESSION: 1. Findings concerning for acute pancreatitis. Correlation with  pancreatic enzymes recommended. No drainable fluid collection/abscess or  pseudocyst. 2. Sigmoid diverticulosis. No bowel obstruction or active inflammation. Aortic Atherosclerosis (ICD10-I70.0). Electronically Signed   By: Anner Crete M.D.   On: 05/27/2019 01:39   US Abdomen Limited Ruq  Result Date: 05/27/2019 CLINICAL DATA:  Right upper quadrant pain for 6 months EXAM: ULTRASOUND ABDOMEN LIMITED RIGHT UPPER QUADRANT COMPARISON:  Abdominal CT from earlier today FINDINGS: Gallbladder: Surgically absent Common bile duct: Diameter: 7 mm.  Where visualized, no filling defect. Liver: No focal lesion identified. Within normal limits in parenchymal echogenicity. Portal vein is patent on color Doppler imaging with normal direction of blood flow towards the liver. IMPRESSION: Negative right upper quadrant ultrasound after cholecystectomy. Electronically Signed   By: Monte Fantasia M.D.   On: 05/27/2019 06:31    Procedures Procedures (including critical care time)  Medications Ordered in ED Medications  sodium chloride (PF) 0.9 % injection (has no administration in time range)  enoxaparin (LOVENOX) injection 40 mg (has no administration in time range)  dextrose 5 % in lactated ringers infusion ( Intravenous New Bag/Given 05/27/19 0657)  ondansetron (ZOFRAN) tablet 4 mg ( Oral See Alternative 05/27/19 0624)    Or  ondansetron (ZOFRAN) injection 4 mg (4 mg Intravenous Given 05/27/19 0624)  fentaNYL (SUBLIMAZE) injection 25 mcg (25 mcg Intravenous Given 05/27/19 0625)  pantoprazole (PROTONIX) injection 40 mg (has no administration in time range)  potassium chloride 10 mEq in 100 mL IVPB (10 mEq Intravenous New Bag/Given 05/27/19 0658)  sodium chloride flush (NS) 0.9 % injection 3 mL (3 mLs Intravenous Given 05/27/19 0242)  ondansetron (ZOFRAN-ODT) disintegrating tablet 4 mg (4 mg Oral Given 05/26/19 2142)  metoCLOPramide (REGLAN) injection 10 mg (10 mg Intramuscular Given 05/27/19 0013)  iohexol (OMNIPAQUE) 300 MG/ML solution 100 mL (100 mLs Intravenous Contrast Given 05/27/19  0105)  fentaNYL (SUBLIMAZE) injection 100 mcg (100 mcg Intravenous Given 05/27/19 0351)  ondansetron (ZOFRAN) injection 4 mg (4 mg Intravenous Given 05/27/19 0351)  sodium chloride 0.9 % bolus 1,000 mL (0 mLs Intravenous Stopped 05/27/19 0501)     Initial Impression / Assessment and Plan / ED Course  I have reviewed the triage vital signs and the nursing notes.  Pertinent labs & imaging results that were available during my care of the patient were reviewed by me and considered in my medical decision making (see chart for details).        Patient presents to the emergency department for evaluation of abdominal pain.  Patient has a history of recurrent pancreatitis of unclear etiology.  She reports that her symptoms feel identical to previous pancreatitis episode she has had.  Patient experiencing left-sided abdominal pain with tenderness, no guarding or rebound.  Lipase was normal but CT did show evidence of acute pancreatitis.  Additionally she had LFT abnormality but no acute gallbladder disease and no ductal dilatation.  Patient administered IV fluids and analgesia, will admit to the hospitalist service.  Final Clinical Impressions(s) / ED Diagnoses   Final diagnoses:  Acute pancreatitis without infection or necrosis, unspecified pancreatitis type    ED Discharge Orders    None       Orpah Greek, MD 05/27/19 [phone]

## 2019-05-26 NOTE — ED Triage Notes (Signed)
Pt presents by [facility] for generalized abdominal pain that has been ongoing since Thursday. Pt had episode of emesis once arriving to ED with 26ml emesis. Pt also having itching all over with dark urine.

## 2019-05-27 ENCOUNTER — Inpatient Hospital Stay (HOSPITAL_COMMUNITY): Payer: Medicare Other

## 2019-05-27 ENCOUNTER — Emergency Department (HOSPITAL_COMMUNITY): Payer: Medicare Other

## 2019-05-27 ENCOUNTER — Encounter (HOSPITAL_COMMUNITY): Payer: Self-pay

## 2019-05-27 DIAGNOSIS — I959 Hypotension, unspecified: Secondary | ICD-10-CM | POA: Diagnosis present

## 2019-05-27 DIAGNOSIS — R17 Unspecified jaundice: Secondary | ICD-10-CM

## 2019-05-27 DIAGNOSIS — Z66 Do not resuscitate: Secondary | ICD-10-CM | POA: Diagnosis not present

## 2019-05-27 DIAGNOSIS — Z6838 Body mass index (BMI) 38.0-38.9, adult: Secondary | ICD-10-CM | POA: Diagnosis not present

## 2019-05-27 DIAGNOSIS — Z885 Allergy status to narcotic agent status: Secondary | ICD-10-CM | POA: Diagnosis not present

## 2019-05-27 DIAGNOSIS — K3189 Other diseases of stomach and duodenum: Secondary | ICD-10-CM | POA: Diagnosis not present

## 2019-05-27 DIAGNOSIS — F419 Anxiety disorder, unspecified: Secondary | ICD-10-CM | POA: Diagnosis not present

## 2019-05-27 DIAGNOSIS — R1011 Right upper quadrant pain: Secondary | ICD-10-CM | POA: Diagnosis not present

## 2019-05-27 DIAGNOSIS — Z90710 Acquired absence of both cervix and uterus: Secondary | ICD-10-CM | POA: Diagnosis not present

## 2019-05-27 DIAGNOSIS — Z20828 Contact with and (suspected) exposure to other viral communicable diseases: Secondary | ICD-10-CM | POA: Diagnosis not present

## 2019-05-27 DIAGNOSIS — K8051 Calculus of bile duct without cholangitis or cholecystitis with obstruction: Secondary | ICD-10-CM | POA: Diagnosis not present

## 2019-05-27 DIAGNOSIS — K859 Acute pancreatitis without necrosis or infection, unspecified: Secondary | ICD-10-CM

## 2019-05-27 DIAGNOSIS — F339 Major depressive disorder, recurrent, unspecified: Secondary | ICD-10-CM | POA: Diagnosis not present

## 2019-05-27 DIAGNOSIS — K317 Polyp of stomach and duodenum: Secondary | ICD-10-CM | POA: Diagnosis not present

## 2019-05-27 DIAGNOSIS — I10 Essential (primary) hypertension: Secondary | ICD-10-CM | POA: Diagnosis present

## 2019-05-27 DIAGNOSIS — Z8042 Family history of malignant neoplasm of prostate: Secondary | ICD-10-CM | POA: Diagnosis not present

## 2019-05-27 DIAGNOSIS — K831 Obstruction of bile duct: Secondary | ICD-10-CM | POA: Diagnosis not present

## 2019-05-27 DIAGNOSIS — Z791 Long term (current) use of non-steroidal anti-inflammatories (NSAID): Secondary | ICD-10-CM | POA: Diagnosis not present

## 2019-05-27 DIAGNOSIS — Z888 Allergy status to other drugs, medicaments and biological substances status: Secondary | ICD-10-CM | POA: Diagnosis not present

## 2019-05-27 DIAGNOSIS — L299 Pruritus, unspecified: Secondary | ICD-10-CM | POA: Diagnosis present

## 2019-05-27 DIAGNOSIS — Z823 Family history of stroke: Secondary | ICD-10-CM | POA: Diagnosis not present

## 2019-05-27 DIAGNOSIS — K760 Fatty (change of) liver, not elsewhere classified: Secondary | ICD-10-CM | POA: Diagnosis not present

## 2019-05-27 DIAGNOSIS — Z811 Family history of alcohol abuse and dependence: Secondary | ICD-10-CM | POA: Diagnosis not present

## 2019-05-27 DIAGNOSIS — Z79899 Other long term (current) drug therapy: Secondary | ICD-10-CM | POA: Diagnosis not present

## 2019-05-27 DIAGNOSIS — K851 Biliary acute pancreatitis without necrosis or infection: Secondary | ICD-10-CM | POA: Diagnosis present

## 2019-05-27 DIAGNOSIS — R945 Abnormal results of liver function studies: Secondary | ICD-10-CM | POA: Diagnosis not present

## 2019-05-27 DIAGNOSIS — E876 Hypokalemia: Secondary | ICD-10-CM | POA: Diagnosis present

## 2019-05-27 DIAGNOSIS — Z79891 Long term (current) use of opiate analgesic: Secondary | ICD-10-CM | POA: Diagnosis not present

## 2019-05-27 DIAGNOSIS — K8050 Calculus of bile duct without cholangitis or cholecystitis without obstruction: Secondary | ICD-10-CM | POA: Diagnosis not present

## 2019-05-27 DIAGNOSIS — R109 Unspecified abdominal pain: Secondary | ICD-10-CM | POA: Diagnosis not present

## 2019-05-27 DIAGNOSIS — K219 Gastro-esophageal reflux disease without esophagitis: Secondary | ICD-10-CM | POA: Diagnosis present

## 2019-05-27 DIAGNOSIS — Z9049 Acquired absence of other specified parts of digestive tract: Secondary | ICD-10-CM | POA: Diagnosis not present

## 2019-05-27 DIAGNOSIS — K5730 Diverticulosis of large intestine without perforation or abscess without bleeding: Secondary | ICD-10-CM | POA: Diagnosis not present

## 2019-05-27 LAB — URINALYSIS, ROUTINE W REFLEX MICROSCOPIC
Bacteria, UA: NONE SEEN
Glucose, UA: NEGATIVE mg/dL
Ketones, ur: NEGATIVE mg/dL
Leukocytes,Ua: NEGATIVE
Nitrite: NEGATIVE
Protein, ur: NEGATIVE mg/dL
Specific Gravity, Urine: 1.028 (ref 1.005–1.030)
pH: 5 (ref 5.0–8.0)

## 2019-05-27 LAB — COMPREHENSIVE METABOLIC PANEL
ALT: 354 U/L — ABNORMAL HIGH (ref 0–44)
AST: 222 U/L — ABNORMAL HIGH (ref 15–41)
Albumin: 3.4 g/dL — ABNORMAL LOW (ref 3.5–5.0)
Alkaline Phosphatase: 219 U/L — ABNORMAL HIGH (ref 38–126)
Anion gap: 9 (ref 5–15)
BUN: 12 mg/dL (ref 8–23)
CO2: 21 mmol/L — ABNORMAL LOW (ref 22–32)
Calcium: 8.6 mg/dL — ABNORMAL LOW (ref 8.9–10.3)
Chloride: 107 mmol/L (ref 98–111)
Creatinine, Ser: 0.76 mg/dL (ref 0.44–1.00)
GFR calc Af Amer: >60 mL/min (ref >60)
GFR calc non Af Amer: >60 mL/min (ref >60)
Glucose, Bld: 119 mg/dL — ABNORMAL HIGH (ref 70–99)
Potassium: 3.8 mmol/L (ref 3.5–5.1)
Sodium: 137 mmol/L (ref 135–145)
Total Bilirubin: 5.4 mg/dL — ABNORMAL HIGH (ref 0.3–1.2)
Total Protein: 7 g/dL (ref 6.5–8.1)

## 2019-05-27 LAB — HIV ANTIBODY (ROUTINE TESTING W REFLEX): HIV Screen 4th Generation wRfx: NONREACTIVE

## 2019-05-27 LAB — CBC
HCT: 40.3 % (ref 36.0–46.0)
Hemoglobin: 13.2 g/dL (ref 12.0–15.0)
MCH: 30 pg (ref 26.0–34.0)
MCHC: 32.8 g/dL (ref 30.0–36.0)
MCV: 91.6 fL (ref 80.0–100.0)
Platelets: 221 10*3/uL (ref 150–400)
RBC: 4.4 MIL/uL (ref 3.87–5.11)
RDW: 14.1 % (ref 11.5–15.5)
WBC: 11.1 10*3/uL — ABNORMAL HIGH (ref 4.0–10.5)
nRBC: 0 % (ref 0.0–0.2)

## 2019-05-27 LAB — CREATININE, SERUM
Creatinine, Ser: 0.8 mg/dL (ref 0.44–1.00)
GFR calc Af Amer: >60 mL/min (ref >60)
GFR calc non Af Amer: >60 mL/min (ref >60)

## 2019-05-27 LAB — SARS CORONAVIRUS 2 BY RT PCR (HOSPITAL ORDER, PERFORMED IN ~~LOC~~ HOSPITAL LAB): SARS Coronavirus 2: NEGATIVE

## 2019-05-27 MED ORDER — MORPHINE SULFATE (PF) 2 MG/ML IV SOLN
1.0000 mg | INTRAVENOUS | Status: DC | PRN
  Filled 2019-05-27: qty 1

## 2019-05-27 MED ORDER — HYDROMORPHONE HCL 1 MG/ML IJ SOLN: 1.0000 mg | Freq: Once | INTRAMUSCULAR | Status: DC

## 2019-05-27 MED ORDER — GADOBUTROL 1 MMOL/ML IV SOLN
10.0000 mL | Freq: Once | INTRAVENOUS | Status: AC | PRN
  Administered 2019-05-27: 10 mL via INTRAVENOUS

## 2019-05-27 MED ORDER — ONDANSETRON HCL 4 MG/2ML IJ SOLN
4.0000 mg | Freq: Four times a day (QID) | INTRAMUSCULAR | Status: DC | PRN
  Administered 2019-05-27 – 2019-05-29 (×6): 4 mg via INTRAVENOUS
  Filled 2019-05-27 (×7): qty 2

## 2019-05-27 MED ORDER — ENOXAPARIN SODIUM 40 MG/0.4ML ~~LOC~~ SOLN
40.0000 mg | SUBCUTANEOUS | Status: DC
  Administered 2019-05-27 – 2019-05-28 (×2): 40 mg via SUBCUTANEOUS
  Filled 2019-05-27 (×2): qty 0.4

## 2019-05-27 MED ORDER — ZOLPIDEM TARTRATE 5 MG PO TABS
5.0000 mg | ORAL_TABLET | Freq: Every evening | ORAL | Status: DC | PRN
  Filled 2019-05-27: qty 1

## 2019-05-27 MED ORDER — DIPHENHYDRAMINE HCL 50 MG/ML IJ SOLN
25.0000 mg | Freq: Four times a day (QID) | INTRAMUSCULAR | Status: DC | PRN
  Administered 2019-05-27 – 2019-05-29 (×6): 25 mg via INTRAVENOUS
  Filled 2019-05-27 (×6): qty 1

## 2019-05-27 MED ORDER — METOCLOPRAMIDE HCL 5 MG/ML IJ SOLN
10.0000 mg | Freq: Once | INTRAMUSCULAR | Status: AC
  Administered 2019-05-27: 10 mg via INTRAMUSCULAR
  Filled 2019-05-27: qty 2

## 2019-05-27 MED ORDER — SERTRALINE HCL 100 MG PO TABS
100.0000 mg | ORAL_TABLET | Freq: Every day | ORAL | Status: DC
  Administered 2019-05-27 – 2019-05-31 (×5): 100 mg via ORAL
  Filled 2019-05-27 (×5): qty 1

## 2019-05-27 MED ORDER — POTASSIUM CHLORIDE 10 MEQ/100ML IV SOLN
10.0000 meq | INTRAVENOUS | Status: AC
  Administered 2019-05-27: 07:00:00 10 meq via INTRAVENOUS
  Filled 2019-05-27: qty 100

## 2019-05-27 MED ORDER — FENTANYL CITRATE (PF) 100 MCG/2ML IJ SOLN
100.0000 ug | Freq: Once | INTRAMUSCULAR | Status: AC
  Administered 2019-05-27: 100 ug via INTRAVENOUS
  Filled 2019-05-27: qty 2

## 2019-05-27 MED ORDER — DEXTROSE IN LACTATED RINGERS 5 % IV SOLN
INTRAVENOUS | Status: DC
  Administered 2019-05-27 – 2019-05-29 (×4): via INTRAVENOUS
  Filled 2019-05-27: qty 1000

## 2019-05-27 MED ORDER — DIPHENHYDRAMINE HCL 50 MG/ML IJ SOLN: 25.0000 mg | Freq: Once | INTRAMUSCULAR | Status: DC

## 2019-05-27 MED ORDER — ONDANSETRON HCL 4 MG/2ML IJ SOLN
4.0000 mg | Freq: Once | INTRAMUSCULAR | Status: AC
  Administered 2019-05-27: 4 mg via INTRAVENOUS
  Filled 2019-05-27: qty 2

## 2019-05-27 MED ORDER — SODIUM CHLORIDE (PF) 0.9 % IJ SOLN
INTRAMUSCULAR | Status: AC
  Filled 2019-05-27: qty 50

## 2019-05-27 MED ORDER — ONDANSETRON HCL 4 MG PO TABS
4.0000 mg | ORAL_TABLET | Freq: Four times a day (QID) | ORAL | Status: DC | PRN
  Administered 2019-05-29: 4 mg via ORAL
  Filled 2019-05-27 (×2): qty 1

## 2019-05-27 MED ORDER — SODIUM CHLORIDE 0.9 % IV BOLUS
1000.0000 mL | Freq: Once | INTRAVENOUS | Status: AC
  Administered 2019-05-27: 1000 mL via INTRAVENOUS

## 2019-05-27 MED ORDER — IOHEXOL 300 MG/ML  SOLN
100.0000 mL | Freq: Once | INTRAMUSCULAR | Status: AC | PRN
  Administered 2019-05-27: 100 mL via INTRAVENOUS

## 2019-05-27 MED ORDER — DIPHENHYDRAMINE HCL 50 MG/ML IJ SOLN
25.0000 mg | Freq: Once | INTRAMUSCULAR | Status: AC
  Administered 2019-05-27: 25 mg via INTRAVENOUS
  Filled 2019-05-27: qty 1

## 2019-05-27 MED ORDER — PANTOPRAZOLE SODIUM 40 MG IV SOLR
40.0000 mg | Freq: Every day | INTRAVENOUS | Status: DC
  Administered 2019-05-27 – 2019-05-30 (×4): 40 mg via INTRAVENOUS
  Filled 2019-05-27 (×5): qty 40

## 2019-05-27 MED ORDER — FENTANYL CITRATE (PF) 100 MCG/2ML IJ SOLN
25.0000 ug | INTRAMUSCULAR | Status: DC | PRN
  Administered 2019-05-27 – 2019-05-28 (×6): 25 ug via INTRAVENOUS
  Filled 2019-05-27 (×6): qty 2

## 2019-05-27 MED ORDER — HYDROCODONE-ACETAMINOPHEN 5-325 MG PO TABS
1.0000 | ORAL_TABLET | Freq: Four times a day (QID) | ORAL | Status: DC | PRN
  Administered 2019-05-27 – 2019-05-30 (×5): 1 via ORAL
  Filled 2019-05-27 (×5): qty 1

## 2019-05-27 MED ORDER — PIPERACILLIN-TAZOBACTAM 3.375 G IVPB
3.3750 g | Freq: Three times a day (TID) | INTRAVENOUS | Status: DC
  Administered 2019-05-27 – 2019-05-30 (×10): 3.375 g via INTRAVENOUS
  Filled 2019-05-27 (×10): qty 50

## 2019-05-27 MED ORDER — CAMPHOR-MENTHOL 0.5-0.5 % EX LOTN
TOPICAL_LOTION | CUTANEOUS | Status: DC | PRN
  Filled 2019-05-27: qty 222

## 2019-05-27 NOTE — ED Notes (Addendum)
Pt said IV burned. Reduce potassium rate from 100 ml/hr to 50 ml/hr.

## 2019-05-27 NOTE — Progress Notes (Signed)
Pharmacy Antibiotic Note  Linda Blackburn is a 68 y.o. female admitted on 05/26/2019 with recurrent pancreatitis .  Pharmacy has been consulted for Zosyn dosing.  Plan: -Zosyn 3.375g IV q8h (each dose infused over 4 hours). -Need for further dosage adjustment appears unlikely at present so pharmacy will sign off at this time.  Please reconsult if a change in clinical status warrants re-evaluation of dosage.   Height: 5\' 2"  (157.5 cm) Weight: 211 lb 10.3 oz (96 kg) IBW/kg (Calculated) : 50.1  Temp (24hrs), Avg:98.4 F (36.9 C), Min:98.2 F (36.8 C), Max:98.6 F (37 C)  Recent Labs  Lab 05/26/19 2138 05/27/19 0615  WBC 7.4 11.1*  CREATININE 0.94 0.76  0.80    Estimated Creatinine Clearance: 70.8 mL/min (by C-G formula based on SCr of 0.8 mg/dL).    Allergies  Allergen Reactions  . Other Hives and Swelling    AQUASONIC [place] GEL  . Buprenorphine Hcl     "In a scarry movie" weird thoughts  . Codeine     GI upset  . Dilaudid [Hydromorphone Hcl] Itching  . Morphine And Related     "In a scarry movie" weird thoughts    Antimicrobials this admission: 9/22 Zosyn >>  Thank you for allowing pharmacy to be a part of this patient's care.  Luiz Ochoa 05/27/2019 3:41 PM

## 2019-05-27 NOTE — ED Notes (Signed)
Pt would like to take her oxybutynin, sertraline, losartan, and protonix at 7 AM this morning. Messaged provider.

## 2019-05-27 NOTE — Progress Notes (Signed)
[facility]  PROGRESS NOTE  Linda Blackburn RVA:[id number] DOB: [birth year] DOA: 05/26/2019 PCP: [name], MD  Brief History    Linda Blackburn is a 68 y.o. year old female with medical history significant for recurrent idiopathic pancreatitis, HTN, GERD who presented on 05/26/2019 with abdominal pain x4 days with no exacerbating or alleviating factors with intermittent nausea/vomiting not controlled on oral pain medicine.  Patient also noted discoloration of urine and yellowing skin.  Patient was admitted for recurrent acute pancreatitis complicated by transaminitis and hyperbilirubinemia and hypotension.  ED course: T-max 98.2, BP 92/50, WBC 7.4, creatinine 0.94 Alk phos 243, AST 231, ALT 372, total bili 4.2 Glucose 116 CT abdomen showed findings concerning for acute pancreatitis  A & P     Recurrent acute pancreatitis.  Unable to tolerate oral diet with persistent nausea/vomiting despite oral pain medicines at home.  Unclear etiology, lipase wnl, CT abd suggests flare. GB removed (2001), RUQ u/s wnl. Previous ERCP 09/2018 in [place] unable to cannulate the pancreatic duct ( ERCP 03/2015 as well). Given elevated LFTs and total bilirubin concern there could be biliary duct involvement. GI consulted may need MRCP vs ERCP pending their recommendations.  IV antiemetics, IV pain control   Jaundice with transaminitis and hyperbilirubinemia.  Unremarkable right upper quadrant ultrasound, CT abdomen does not report biliary duct involvement, I think she would need further evaluation with MRCP but unclear if she would benefit from potential endoscopic intervention, GI consult as mentioned above, CMP serially   Hypotension, improving.  Likely due to diminished p.o. intake, nausea/vomiting.  Responding to IV fluids, continue maintenance fluids, holding home blood pressure medicines   Leukocytosis, likely stress related to acute pancreatitis.  Closely monitor.   Hypertension, currently  normotensive.  Hold home BP meds.   GERD, stable.  Continue IV PPI given inability to tolerate oral  Remainder per H NP from earlier this morning   Vitals:  Vitals:   05/27/19 0700 05/27/19 0842  BP: (!) 81/57 (!) 112/57  Pulse: 60 62  Resp: 16 18  Temp:  98.6 F (37 C)  SpO2: 94% 94%    Exam:  Awake Alert, Oriented X 3, No new F.N deficits, Normal affect Jaundiced skin, scleral icterus Epigastric tenderness, no rebound tenderness, no guarding or rigidity, decreased bowel sounds, no ascites Symmetrical Chest wall movement, Good air movement bilaterally, CTAB RRR,No Gallops,Rubs or new Murmurs No Cyanosis, Clubbing or edema, No new Rash or bruise      Desiree Hane  [facility]

## 2019-05-27 NOTE — Consult Note (Addendum)
Consultation  Referring Provider: Dr. Lonny Prude     Primary Care Physician:  [name], MD Primary Gastroenterologist: Dr. Ardis Hughs       Reason for Consultation:   Recurrent pancreatitis         HPI:      Mrs. Czaplicki is a 68 year old Caucasian female with a past medical history significant for recurrent idiopathic pancreatitis, known to Dr. Ardis Hughs, who presented to the ER 05/26/2019 with abdominal pain.    Today, the patient explains that she started again with another episode of epigastric abdominal pain on Thursday afternoon 05/22/2019, rated as a 10/10, initially she went and laid down for a couple of hours and severe pain went away but then she started with other symptoms including terrible itching, nausea and vomiting as well as right upper quadrant pain.  This was not relieved by anything but worsened by moving around and breathing deep.  Tells me this feels worse than her previous episodes of pancreatitis with these new symptoms.  She continues with nausea and had profuse vomiting last night as well as itching all over which has gotten slightly better today.  Continues with an epigastric pain rated a 7-8/10 which radiates into her right shoulder.      Tells me she had another episode in March of this year for which she was seen in the hospital.  Then again 2 to 3 weeks ago she started with epigastric pain but it went away on its own when she stopped eating and started drinking fluids at home.    Continues with alternating bowel habits between diarrhea and constipation at home, tells me sometimes she can have 8-10 loose stools a day.  She does not know what to eat anymore that will not bother her. Has  Never been on Creon.    Currently explains that she has not been able to urinate.  Nursing staff tells me she is about to have a bladder scan.    Reminds me that she has been at [facility] and no one feels as though they can do anything for her anymore.  She has a difficult duct to cannulate.    Social history positive for having 2 sons who are not in town.    Denies fever or chills.  ER course: Alk phos 243, AST 231, ALT 372, total bilirubin 4.2, CT abdomen pelvis showed findings concerning for acute pancreatitis  Previous GI history: 11/19/2018-11/25/2018 hospitalization for recurrent pancreatitis: Cleared with fluids and pain meds  10/18/2018 office visit Dr. Ardis Hughs: Discussed recurrent acute pancreatitis.  She had been seen 4 years prior with a total of 6 episodes of pancreatitis.  Had undergone at least one ERCP in [place] removal of gallbladder in 2001, had intermittently been told she had pancreatic divisum, she was referred to [facility] Dr. Zenia Resides  "Old Data Reviewed: She recently sent me records from [place] January 2020 ERCP by Dr. Daisy Lazar.  She underwent a battery of tests at [facility] including MRI, labs and she tells me he was unsure why she was having recurrent pancreatitis and told her that her options were doing nothing, having a Whipple surgery or ERCP.  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  Prior biliary sphincterotomy appeared open.  The cholangiogram was normal.  The patient had had a cholecystectomy.  The biliary tree was swept and nothing was found.  He was unable to cannulate her  pancreatic ducts"  I did a colonoscopy for her March 2016 for routine risk colon cancer screening.  I found a single subcentimeter polyp.  It was adenomatous and I recommended a repeat colonoscopy at 5-year interval.  09/09/2018 ERCP for acute recurrent pancreatitis, [facility]: Major papilla was located entirely within a diverticulum, prior biliary endoscopic sphincterotomy appeared open, cholangiogram normal, biliary tree was swept and nothing was found, failure to cannulate the DPD or VPD  03/25/2015 ERCP for acute recurrent pancreatitis at [facility]: Major papilla was located partially within a  diverticulum, which contained food debris, prior biliary endoscopic sphincterotomy appeared open, a precut pancreatic sphincterotomy at the major papilla was performed  Past Medical History:  Diagnosis Date   Abnormal uterine bleeding    Amenorrhea    Anxiety    Depression    Gallstones    Hay fever    HTN (hypertension)    Pancreatitis    Urine incontinence    UTI (urinary tract infection)    reoccuring     Past Surgical History:  Procedure Laterality Date   ABDOMINAL HYSTERECTOMY  1984   fibroids   APPENDECTOMY  1984   CHOLECYSTECTOMY  09/08/1999   ERCP W/ SPHINCTEROTOMY AND BALLOON DILATION  08/2008   OOPHORECTOMY     Only 1 was taken   TONSILLECTOMY     TUBAL LIGATION      Family History  Problem Relation Age of Onset   Alcohol abuse Mother    Prostate cancer Father    Stroke Maternal Aunt    Hiatal hernia Maternal Aunt    Prostate cancer Paternal Grandfather    Cerebral palsy Granddaughter    Breast cancer Neg Hx      Social History   Tobacco Use   Smoking status: Never Smoker   Smokeless tobacco: Never Used  Substance Use Topics   Alcohol use: Yes    Alcohol/week: 0.0 standard drinks    Comment: occ - hardly every    Drug use: No    Prior to Admission medications   Medication Sig Start Date End Date Taking? Authorizing Provider  HYDROcodone-acetaminophen (NORCO/VICODIN) 5-325 MG tablet Take 1 tablet by mouth every 6 (six) hours as needed for moderate pain.   Yes [provider]  losartan (COZAAR) 100 MG tablet Take 1 tablet (100 mg total) by mouth daily. 08/19/18  Yes Burchette, Alinda Sierras, MD  naproxen sodium (ALEVE) 220 MG tablet Take 440 mg by mouth daily as needed (pain).   Yes [provider]  ondansetron (ZOFRAN-ODT) 4 MG disintegrating tablet Take 1 tablet (4 mg total) by mouth every 8 (eight) hours as needed. Patient taking differently: Take 4 mg by mouth every 8 (eight) hours as needed for nausea or  vomiting.  08/19/18  Yes Burchette, Alinda Sierras, MD  oxybutynin (DITROPAN-XL) 10 MG 24 hr tablet Take 10 mg by mouth daily after breakfast. 04/03/19  Yes [provider]  pantoprazole (PROTONIX) 40 MG tablet Take 1 tablet (40 mg total) by mouth daily at 6 (six) AM. 01/03/19  Yes Burchette, Alinda Sierras, MD  sertraline (ZOLOFT) 100 MG tablet Take 1 tablet (100 mg total) by mouth daily. 08/19/18  Yes Burchette, Alinda Sierras, MD  zolpidem (AMBIEN) 5 MG tablet Take 1 tablet (5 mg total) by mouth at bedtime as needed for sleep. 08/07/16  Yes Burchette, Alinda Sierras, MD    Current Facility-Administered Medications  Medication Dose Route Frequency Provider Last Rate Last Dose   dextrose 5 % in lactated  ringers infusion   Intravenous Continuous Elwyn Reach, MD 125 mL/hr at 05/27/19 0657     enoxaparin (LOVENOX) injection 40 mg  40 mg Subcutaneous Q24H Elwyn Reach, MD       fentaNYL (SUBLIMAZE) injection 25 mcg  25 mcg Intravenous Q2H PRN Elwyn Reach, MD   25 mcg at 05/27/19 6803   HYDROcodone-acetaminophen (NORCO/VICODIN) 5-325 MG per tablet 1 tablet  1 tablet Oral Q6H PRN Oretha Milch D, MD       morphine 2 MG/ML injection 1 mg  1 mg Intravenous Q3H PRN Desiree Hane, MD       ondansetron (ZOFRAN) tablet 4 mg  4 mg Oral Q6H PRN Elwyn Reach, MD       Or   ondansetron (ZOFRAN) injection 4 mg  4 mg Intravenous Q6H PRN Elwyn Reach, MD   4 mg at 05/27/19 2122   pantoprazole (PROTONIX) injection 40 mg  40 mg Intravenous Daily Gala Romney L, MD       sodium chloride (PF) 0.9 % injection             Allergies as of 05/26/2019 - Review Complete 05/26/2019  Allergen Reaction Noted   Other Hives and Swelling 10/04/2016   Buprenorphine hcl  03/20/2016   Codeine  12/06/2010   Dilaudid [hydromorphone hcl] Itching 12/02/2018   Morphine and related  12/06/2010     Review of Systems:    Constitutional: No weight loss, fever or chills Skin: No rash  Cardiovascular: No  chest pain Respiratory: No SOB  Gastrointestinal: See HPI and otherwise negative Genitourinary: No dysuria Neurological: No headache, dizziness or syncope Musculoskeletal: No new muscle or joint pain Hematologic: No bleeding  Psychiatric: No history of depression or anxiety    Physical Exam:  Vital signs in last 24 hours: Temp:  [98.2 F (36.8 C)-98.6 F (37 C)] 98.6 F (37 C) (09/22 0842) Pulse Rate:  [56-78] 62 (09/22 0842) Resp:  [12-25] 18 (09/22 0842) BP: (81-131)/(49-63) 112/57 (09/22 0842) SpO2:  [89 %-98 %] 94 % (09/22 0842) Weight:  [90.7 kg-96 kg] 96 kg (09/22 0842)   General:   Very Pleasant Caucasian female appears to be in NAD, Well developed, Well nourished, alert and cooperative Head:  Normocephalic and atraumatic. Eyes:   PEERL, EOMI. + mild icterus. Conjunctiva pink. Ears:  Normal auditory acuity. Neck:  Supple Throat: Oral cavity and pharynx without inflammation, swelling or lesion. Teeth in good condition. Lungs: Respirations even and unlabored. Lungs clear to auscultation bilaterally.   No wheezes, crackles, or rhonchi.  Heart: Normal S1, S2. No MRG. Regular rate and rhythm. No peripheral edema, cyanosis or pallor.  Abdomen:  Soft, nondistended, Marked ttp in the epigastrum with involuntary guarding Normal bowel sounds. No appreciable masses or hepatomegaly. Rectal:  Not performed.  Msk:  Symmetrical without gross deformities. Peripheral pulses intact.  Extremities:  Without edema, no deformity or joint abnormality. Normal ROM, normal sensation. Neurologic:  Alert and  oriented x4;  grossly normal neurologically.  Skin:   Dry and intact without significant lesions or rashes.+ Jaundiced Psychiatric: Demonstrates good judgement and reason without abnormal affect or behaviors.  LAB RESULTS: Recent Labs    05/26/19 2138 05/27/19 0615  WBC 7.4 11.1*  HGB 14.7 13.2  HCT 46.1* 40.3  PLT 233 221   BMET Recent Labs    05/26/19 2138 05/27/19 0615  NA 139  137  K 3.4* 3.8  CL 105 107  CO2 24 21*  GLUCOSE  116* 119*  BUN 13 12  CREATININE 0.94 0.76   0.80  CALCIUM 9.3 8.6*   Hepatic Function Latest Ref Rng & Units 05/27/2019 05/26/2019 11/21/2018  Total Protein 6.5 - 8.1 g/dL 7.0 7.6 6.0(L)  Albumin 3.5 - 5.0 g/dL 3.4(L) 4.1 3.3(L)  AST 15 - 41 U/L 222(H) 231(H) 18  ALT 0 - 44 U/L 354(H) 372(H) 18  Alk Phosphatase 38 - 126 U/L 219(H) 243(H) 75  Total Bilirubin 0.3 - 1.2 mg/dL 5.4(H) 4.2(H) 1.7(H)  Bilirubin, Direct 0.0 - 0.3 mg/dL - - -   STUDIES: Ct Abdomen Pelvis W Contrast  Result Date: 05/27/2019 CLINICAL DATA:  68 year old female with generalized abdominal pain x5 days. EXAM: CT ABDOMEN AND PELVIS WITH CONTRAST TECHNIQUE: Multidetector CT imaging of the abdomen and pelvis was performed using the standard protocol following bolus administration of intravenous contrast. CONTRAST:  13m OMNIPAQUE IOHEXOL 300 MG/ML  SOLN COMPARISON:  CT dated 08/08/2018. FINDINGS: Lower chest: The visualized lung bases are clear. No intra-abdominal free air or free fluid. Hepatobiliary: Apparent mild fatty infiltration of the liver. No intrahepatic biliary ductal dilatation. Cholecystectomy. No retained calcified stone noted in the central CBD Pancreas: Mild haziness of the peripancreatic and upper abdominal fat concerning for acute pancreatitis. Correlation with pancreatic enzymes recommended. No drainable fluid collection/abscess or pseudocyst. Spleen: Normal in size without focal abnormality. Adrenals/Urinary Tract: The adrenal glands are unremarkable. There is no hydronephrosis on either side. There is symmetric enhancement and excretion of contrast by both kidneys. The visualized ureters and urinary bladder appear unremarkable. Stomach/Bowel: There is sigmoid diverticulosis without active inflammatory changes. There is a 2 cm duodenal diverticulum. There is no bowel obstruction or active inflammation. The appendix is not visualized with certainty. No  inflammatory changes identified in the right lower quadrant. Vascular/Lymphatic: Mild atherosclerotic calcification of the aorta. The IVC is unremarkable. No portal venous gas. There is no adenopathy. Reproductive: Hysterectomy. No pelvic mass. Other: Small fat containing umbilical hernia. Musculoskeletal: No acute or significant osseous findings. IMPRESSION: 1. Findings concerning for acute pancreatitis. Correlation with pancreatic enzymes recommended. No drainable fluid collection/abscess or pseudocyst. 2. Sigmoid diverticulosis. No bowel obstruction or active inflammation. Aortic Atherosclerosis (ICD10-I70.0). Electronically Signed   By: AAnner CreteM.D.   On: 05/27/2019 01:39   UKoreaAbdomen Limited Ruq  Result Date: 05/27/2019 CLINICAL DATA:  Right upper quadrant pain for 6 months EXAM: ULTRASOUND ABDOMEN LIMITED RIGHT UPPER QUADRANT COMPARISON:  Abdominal CT from earlier today FINDINGS: Gallbladder: Surgically absent Common bile duct: Diameter: 7 mm.  Where visualized, no filling defect. Liver: No focal lesion identified. Within normal limits in parenchymal echogenicity. Portal vein is patent on color Doppler imaging with normal direction of blood flow towards the liver. IMPRESSION: Negative right upper quadrant ultrasound after cholecystectomy. Electronically Signed   By: JMonte FantasiaM.D.   On: 05/27/2019 06:31    Impression / Plan:   Impression: 1.  Acute recurrent pancreatitis: Patient has recurrent pancreatitis considered idiopathic, however obstructive jaundice from labs and symptoms with nausea, pruritus and vomiting, right upper quadrant ultrasound negative for gallstones this admission, history of pancreatic divisum 2.  GERD: Controlled on PPI  Plan: 1.  Will discuss complicated case with Dr. DLoletha Carrow  Patient may benefit from repeat ERCP, though unsure we could do this here given complicated anatomy. 2.  Continue to monitor LFTs 3.  Continue IV fluids and pain control 4.  Patient  would likely benefit from Creon/Zenpep as an outpatient 5.  Please await any further recommendations from  Dr. Loletha Carrow later today.  Thank you for your kind consultation, we will continue to follow.  Lavone Nian [facility]  05/27/2019, 10:15 AM   I have reviewed the entire case in detail with the above APP and discussed the plan in detail.  Therefore, I agree with the diagnoses recorded above. In addition,  I have personally interviewed and examined the patient and have personally reviewed any abdominal/pelvic CT scan images.  My additional thoughts are as follows:  Because of her recurrent pancreatitis is uncertain, but may be related to abnormal biliopancreatic ductal anatomy a large duodenal diverticulum.  Advanced endoscopist's have been unable to cannulate her pancreatic duct at the time of ERCP.  She has had a previous biliary sphincterotomy that was reportedly patent on last ERCP in [place] last year. Nevertheless, she now has jaundice and pruritus with this episode of pancreatitis, which is not something that has occurred in the past according to the available records or her recollection. This raises significant concern for choledocholithiasis despite lack of biliary ductal dilatation on ultrasound and CT this admission.  We will plan to start her on Zosyn and obtain an MRI abdomen/MRCP.  If this patient has choledocholithiasis, she will require transfer to a tertiary care center for advanced biliary endoscopic attention.  Nelida Meuse III Office:[phone]

## 2019-05-27 NOTE — H&P (Signed)
History and Physical   Linda Blackburn [id number] DOB: 11-Feb-1951 DOA: 05/26/2019  Referring MD/NP/PA: Dr Sandrea Matte  PCP: [name], MD   Outpatient Specialists: None  Patient coming from: Home  Chief Complaint: Abdominal pain  HPI: Linda Blackburn is a 68 y.o. female with medical history significant of recurrent idiopathic pancreatitis presenting to the ER with another episode of abdominal pain mainly in the epigastric region rated as 10 out of 10 not relieved by any thing but worsened by activities.  Patient believe this is consistent with her previous pancreatitis.  She has some nausea with some vomiting.  She also has had some itching but no rashes.  Patient denies any fever.  Denied any diarrhea but has been having cramping.  She was seen in the ER and evaluated.  Lipase appears to be normal but CT showed peripancreatic stranding and inflammation.  She is being admitted with acute on chronic pancreatitis..  ED Course: Temperature 98.2 blood pressure 92/50 pulse 78 respiratory 25 oxygen sat 89% room air and 93% on 2 L.  White count 7.4 with CBC within normal potassium 3.4.  Creatinine 0.94.  Alkaline phosphatase 243 AST 231 ALT 372 total bilirubin is 4.2.  Glucose 116.  CT abdomen pelvis showed findings concerning for acute pancreatitis.  Also sigmoid diverticulosis.  Review of Systems: As per HPI otherwise 10 point review of systems negative.    Past Medical History:  Diagnosis Date   Abnormal uterine bleeding    Amenorrhea    Anxiety    Depression    Gallstones    Hay fever    HTN (hypertension)    Pancreatitis    Urine incontinence    UTI (urinary tract infection)    reoccuring     Past Surgical History:  Procedure Laterality Date   ABDOMINAL HYSTERECTOMY  1984   fibroids   APPENDECTOMY  1984   CHOLECYSTECTOMY  09/08/1999   ERCP W/ SPHINCTEROTOMY AND BALLOON DILATION  08/2008   OOPHORECTOMY     Only 1 was taken   TONSILLECTOMY     TUBAL LIGATION        reports that she has never smoked. She has never used smokeless tobacco. She reports current alcohol use. She reports that she does not use drugs.  Allergies  Allergen Reactions   Other Hives and Swelling    AQUASONIC [place] GEL   Buprenorphine Hcl     "In a scarry movie" weird thoughts   Codeine     GI upset   Dilaudid [Hydromorphone Hcl] Itching   Morphine And Related     "In a scarry movie" weird thoughts    Family History  Problem Relation Age of Onset   Alcohol abuse Mother    Prostate cancer Father    Stroke Maternal Aunt    Hiatal hernia Maternal Aunt    Prostate cancer Paternal Grandfather    Cerebral palsy Granddaughter    Breast cancer Neg Hx      Prior to Admission medications   Medication Sig Start Date End Date Taking? Authorizing Provider  HYDROcodone-acetaminophen (NORCO/VICODIN) 5-325 MG tablet Take 1 tablet by mouth every 6 (six) hours as needed for moderate pain.   Yes [provider]  losartan (COZAAR) 100 MG tablet Take 1 tablet (100 mg total) by mouth daily. 08/19/18  Yes Burchette, Alinda Sierras, MD  naproxen sodium (ALEVE) 220 MG tablet Take 440 mg by mouth daily as needed (pain).   Yes [provider]  ondansetron (ZOFRAN-ODT) 4 MG  disintegrating tablet Take 1 tablet (4 mg total) by mouth every 8 (eight) hours as needed. Patient taking differently: Take 4 mg by mouth every 8 (eight) hours as needed for nausea or vomiting.  08/19/18  Yes Burchette, Alinda Sierras, MD  oxybutynin (DITROPAN-XL) 10 MG 24 hr tablet Take 10 mg by mouth daily after breakfast. 04/03/19  Yes [provider]  pantoprazole (PROTONIX) 40 MG tablet Take 1 tablet (40 mg total) by mouth daily at 6 (six) AM. 01/03/19  Yes Burchette, Alinda Sierras, MD  sertraline (ZOLOFT) 100 MG tablet Take 1 tablet (100 mg total) by mouth daily. 08/19/18  Yes Burchette, Alinda Sierras, MD  zolpidem (AMBIEN) 5 MG tablet Take 1 tablet (5 mg total) by mouth at bedtime as needed for sleep.  08/07/16  Yes [name], MD    Physical Exam: Vitals:   05/27/19 0404 05/27/19 0407 05/27/19 0430 05/27/19 0500  BP: (!) 92/50  106/63 (!) 115/56  Pulse: 69 65 73 66  Resp: 13 14 (!) 25 (!) 21  Temp:      TempSrc:      SpO2: 92% 95% 94% 93%  Weight:      Height:          Constitutional: Pleasant woman in mild distress Vitals:   05/27/19 0404 05/27/19 0407 05/27/19 0430 05/27/19 0500  BP: (!) 92/50  106/63 (!) 115/56  Pulse: 69 65 73 66  Resp: 13 14 (!) 25 (!) 21  Temp:      TempSrc:      SpO2: 92% 95% 94% 93%  Weight:      Height:       Eyes: PERRL, lids and conjunctivae normal ENMT: Mucous membranes are moist. Posterior pharynx clear of any exudate or lesions.Normal dentition.  Neck: normal, supple, no masses, no thyromegaly Respiratory: clear to auscultation bilaterally, no wheezing, no crackles. Normal respiratory effort. No accessory muscle use.  Cardiovascular: Regular rate and rhythm, no murmurs / rubs / gallops. No extremity edema. 2+ pedal pulses. No carotid bruits.  Abdomen: Diffuse epigastric to right upper quadrant abdominal tenderness, no masses palpated. No hepatosplenomegaly. Bowel sounds positive.  Musculoskeletal: no clubbing / cyanosis. No joint deformity upper and lower extremities. Good ROM, no contractures. Normal muscle tone.  Skin: no rashes, lesions, ulcers. No induration Neurologic: CN 2-12 grossly intact. Sensation intact, DTR normal. Strength 5/5 in all 4.  Psychiatric: Normal judgment and insight. Alert and oriented x 3. Normal mood.     Labs on Admission: I have personally reviewed following labs and imaging studies  CBC: Recent Labs  Lab 05/26/19 2138  WBC 7.4  HGB 14.7  HCT 46.1*  MCV 93.1  PLT 0000000   Basic Metabolic Panel: Recent Labs  Lab 05/26/19 2138  NA 139  K 3.4*  CL 105  CO2 24  GLUCOSE 116*  BUN 13  CREATININE 0.94  CALCIUM 9.3   GFR: Estimated Creatinine Clearance: 58.3 mL/min (by C-G formula based on  SCr of 0.94 mg/dL). Liver Function Tests: Recent Labs  Lab 05/26/19 2138  AST 231*  ALT 372*  ALKPHOS 243*  BILITOT 4.2*  PROT 7.6  ALBUMIN 4.1   Recent Labs  Lab 05/26/19 2138  LIPASE 29   No results for input(s): AMMONIA in the last 168 hours. Coagulation Profile: No results for input(s): INR, PROTIME in the last 168 hours. Cardiac Enzymes: No results for input(s): CKTOTAL, CKMB, CKMBINDEX, TROPONINI in the last 168 hours. BNP (last 3 results) No results for input(s):  PROBNP in the last 8760 hours. HbA1C: No results for input(s): HGBA1C in the last 72 hours. CBG: No results for input(s): GLUCAP in the last 168 hours. Lipid Profile: No results for input(s): CHOL, HDL, LDLCALC, TRIG, CHOLHDL, LDLDIRECT in the last 72 hours. Thyroid Function Tests: No results for input(s): TSH, T4TOTAL, FREET4, T3FREE, THYROIDAB in the last 72 hours. Anemia Panel: No results for input(s): VITAMINB12, FOLATE, FERRITIN, TIBC, IRON, RETICCTPCT in the last 72 hours. Urine analysis:    Component Value Date/Time   COLORURINE YELLOW 11/19/2018 1131   APPEARANCEUR HAZY (A) 11/19/2018 1131   LABSPEC 1.023 11/19/2018 1131   PHURINE 5.0 11/19/2018 1131   GLUCOSEU NEGATIVE 11/19/2018 1131   HGBUR SMALL (A) 11/19/2018 1131   BILIRUBINUR NEGATIVE 11/19/2018 1131   BILIRUBINUR neg 09/14/2016 1158   KETONESUR NEGATIVE 11/19/2018 1131   PROTEINUR NEGATIVE 11/19/2018 1131   UROBILINOGEN negative 09/14/2016 1158   UROBILINOGEN 0.2 06/07/2014 0005   NITRITE NEGATIVE 11/19/2018 1131   LEUKOCYTESUR NEGATIVE 11/19/2018 1131   Sepsis Labs: @LABRCNTIP (procalcitonin:4,lacticidven:4) ) Recent Results (from the past 240 hour(s))  SARS Coronavirus 2 [facility] order, Performed in [facility] hospital lab) Nasopharyngeal Nasopharyngeal Swab     Status: None   Collection Time: 05/27/19  3:59 AM   Specimen: Nasopharyngeal Swab  Result Value Ref Range Status   SARS Coronavirus 2 NEGATIVE NEGATIVE Final     Comment: (NOTE) If result is NEGATIVE SARS-CoV-2 target nucleic acids are NOT DETECTED. The SARS-CoV-2 RNA is generally detectable in upper and lower  respiratory specimens during the acute phase of infection. The lowest  concentration of SARS-CoV-2 viral copies this assay can detect is 250  copies / mL. A negative result does not preclude SARS-CoV-2 infection  and should not be used as the sole basis for treatment or other  patient management decisions.  A negative result may occur with  improper specimen collection / handling, submission of specimen other  than nasopharyngeal swab, presence of viral mutation(s) within the  areas targeted by this assay, and inadequate number of viral copies  (<250 copies / mL). A negative result must be combined with clinical  observations, patient history, and epidemiological information. If result is POSITIVE SARS-CoV-2 target nucleic acids are DETECTED. The SARS-CoV-2 RNA is generally detectable in upper and lower  respiratory specimens dur ing the acute phase of infection.  Positive  results are indicative of active infection with SARS-CoV-2.  Clinical  correlation with patient history and other diagnostic information is  necessary to determine patient infection status.  Positive results do  not rule out bacterial infection or co-infection with other viruses. If result is PRESUMPTIVE POSTIVE SARS-CoV-2 nucleic acids MAY BE PRESENT.   A presumptive positive result was obtained on the submitted specimen  and confirmed on repeat testing.  While 2019 novel coronavirus  (SARS-CoV-2) nucleic acids may be present in the submitted sample  additional confirmatory testing may be necessary for epidemiological  and / or clinical management purposes  to differentiate between  SARS-CoV-2 and other Sarbecovirus currently known to infect humans.  If clinically indicated additional testing with an alternate test  methodology [phone]) is advised. The SARS-CoV-2  RNA is generally  detectable in upper and lower respiratory sp ecimens during the acute  phase of infection. The expected result is Negative. Fact Sheet for Patients:  StrictlyIdeas.no Fact Sheet for Healthcare Providers: BankingDealers.co.za This test is not yet approved or cleared by the [place] FDA and has been authorized for detection and/or diagnosis of SARS-CoV-2  by FDA under an Emergency Use Authorization (EUA).  This EUA will remain in effect (meaning this test can be used) for the duration of the COVID-19 declaration under Section 564(b)(1) of the Act, 21 U.S.C. section 360bbb-3(b)(1), unless the authorization is terminated or revoked sooner. Performed at [facility], [place] [address], [place], [place] [address]      Radiological Exams on Admission: Ct Abdomen Pelvis W Contrast  Result Date: 05/27/2019 CLINICAL DATA:  68 year old female with generalized abdominal pain x5 days. EXAM: CT ABDOMEN AND PELVIS WITH CONTRAST TECHNIQUE: Multidetector CT imaging of the abdomen and pelvis was performed using the standard protocol following bolus administration of intravenous contrast. CONTRAST:  181mL OMNIPAQUE IOHEXOL 300 MG/ML  SOLN COMPARISON:  CT dated 08/08/2018. FINDINGS: Lower chest: The visualized lung bases are clear. No intra-abdominal free air or free fluid. Hepatobiliary: Apparent mild fatty infiltration of the liver. No intrahepatic biliary ductal dilatation. Cholecystectomy. No retained calcified stone noted in the central CBD Pancreas: Mild haziness of the peripancreatic and upper abdominal fat concerning for acute pancreatitis. Correlation with pancreatic enzymes recommended. No drainable fluid collection/abscess or pseudocyst. Spleen: Normal in size without focal abnormality. Adrenals/Urinary Tract: The adrenal glands are unremarkable. There is no hydronephrosis on either side. There is symmetric enhancement  and excretion of contrast by both kidneys. The visualized ureters and urinary bladder appear unremarkable. Stomach/Bowel: There is sigmoid diverticulosis without active inflammatory changes. There is a 2 cm duodenal diverticulum. There is no bowel obstruction or active inflammation. The appendix is not visualized with certainty. No inflammatory changes identified in the right lower quadrant. Vascular/Lymphatic: Mild atherosclerotic calcification of the aorta. The IVC is unremarkable. No portal venous gas. There is no adenopathy. Reproductive: Hysterectomy. No pelvic mass. Other: Small fat containing umbilical hernia. Musculoskeletal: No acute or significant osseous findings. IMPRESSION: 1. Findings concerning for acute pancreatitis. Correlation with pancreatic enzymes recommended. No drainable fluid collection/abscess or pseudocyst. 2. Sigmoid diverticulosis. No bowel obstruction or active inflammation. Aortic Atherosclerosis (ICD10-I70.0). Electronically Signed   By: Anner Crete M.D.   On: 05/27/2019 01:39      Assessment/Plan Principal Problem:   Acute recurrent pancreatitis Active Problems:   Depression, recurrent ([place])   Hypertension   Hypokalemia   Gastroesophageal reflux disease     #1 acute recurrent pancreatitis: Patient has had recurrent pancreatitis considered idiopathic.  She however has obstructive jaundice from the lab.  We will check right upper quadrant abdominal ultrasound to rule out possible gallstones.  I doubt it since she has pancreatic divisum in the past which may be responsible for her recurrent pancreatitis.  Should be admitted and start her on Ringer's lactate, pain control and nausea control.  May consider GI consult.  #2 hypertension: So far blood pressure is low.  If she becomes hypotensive we will start IV medication as she would be n.p.o.  #3 depression with anxiety: Monitor closely.  PRN Ativan if needed.  #4 GERD: PPIs.  #5 hypokalemia: Replete  potassium   DVT prophylaxis: Lovenox Code Status: Full code Family Communication: No family at bedside Disposition Plan: Home Consults called: None Admission status: Inpatient  Severity of Illness: The appropriate patient status for this patient is INPATIENT. Inpatient status is judged to be reasonable and necessary in order to provide the required intensity of service to ensure the patient's safety. The patient's presenting symptoms, physical exam findings, and initial radiographic and laboratory data in the context of their chronic comorbidities is felt to place them at high risk for  further clinical deterioration. Furthermore, it is not anticipated that the patient will be medically stable for discharge from the hospital within 2 midnights of admission. The following factors support the patient status of inpatient.   " The patient's presenting symptoms include abdominal pain with nausea. " The worrisome physical exam findings include epigastric tenderness. " The initial radiographic and laboratory data are worrisome because of evidence of pancreatitis on CT. " The chronic co-morbidities include recurrent pancreatitis.   * I certify that at the point of admission it is my clinical judgment that the patient will require inpatient hospital care spanning beyond 2 midnights from the point of admission due to high intensity of service, high risk for further deterioration and high frequency of surveillance required.Barbette Merino MD [facility] Pager [phone]  If 7PM-7AM, please contact night-coverage [email or web address] Password [facility]  05/27/2019, 5:29 AM

## 2019-05-27 NOTE — ED Notes (Signed)
ED TO INPATIENT HANDOFF REPORT  ED Nurse Name and Phone #: Judye Bos Name/Age/Gender Linda Blackburn 68 y.o. female Room/Bed: WA19/WA19  Code Status   Code Status: Full Code  Home/SNF/Other Home Patient oriented to: self, place, time and situation Is this baseline? Yes   Triage Complete: Triage complete  Chief Complaint Abdominal Pain  Triage Note Pt presents by [facility] for generalized abdominal pain that has been ongoing since Thursday. Pt had episode of emesis once arriving to ED with 244ml emesis. Pt also having itching all over with dark urine.    Allergies Allergies  Allergen Reactions  . Other Hives and Swelling    AQUASONIC [place] GEL  . Buprenorphine Hcl     "In a scarry movie" weird thoughts  . Codeine     GI upset  . Dilaudid [Hydromorphone Hcl] Itching  . Morphine And Related     "In a scarry movie" weird thoughts    Level of Care/Admitting Diagnosis ED Disposition    ED Disposition Condition [place]: [place] [[id number]]  Level of Care: Med-Surg [16]  Covid Evaluation: Asymptomatic Screening Protocol (No Symptoms)  Diagnosis: Acute recurrent pancreatitis [id number]  Admitting Physician: Elwyn Reach [[id number]]  Attending Physician: Elwyn Reach [[id number]]  Estimated length of stay: past midnight tomorrow  Certification:: I certify this patient will need inpatient services for at least 2 midnights  PT Class (Do Not Modify): Inpatient [101]  PT Acc Code (Do Not Modify): Private [1]       B Medical/Surgery History Past Medical History:  Diagnosis Date  . Abnormal uterine bleeding   . Amenorrhea   . Anxiety   . Depression   . Gallstones   . Hay fever   . HTN (hypertension)   . Pancreatitis   . Urine incontinence   . UTI (urinary tract infection)    reoccuring    Past Surgical History:  Procedure Laterality Date  . ABDOMINAL HYSTERECTOMY  1984   fibroids  . APPENDECTOMY  1984  . CHOLECYSTECTOMY   09/08/1999  . ERCP W/ SPHINCTEROTOMY AND BALLOON DILATION  08/2008  . OOPHORECTOMY     Only 1 was taken  . TONSILLECTOMY    . TUBAL LIGATION       A IV Location/Drains/Wounds Patient Lines/Drains/Airways Status   Active Line/Drains/Airways    Name:   Placement date:   Placement time:   Site:   Days:   Peripheral IV 05/27/19 Right;Upper Arm   05/27/19    0045    Arm   less than 1          Intake/Output Last 24 hours  Intake/Output Summary (Last 24 hours) at 05/27/2019 [id number] Last data filed at 05/27/2019 0501 Gross per 24 hour  Intake 990 ml  Output -  Net 990 ml    Labs/Imaging Results for orders placed or performed during the hospital encounter of 05/26/19 (from the past 48 hour(s))  Lipase, blood     Status: None   Collection Time: 05/26/19  9:38 PM  Result Value Ref Range   Lipase 29 11 - 51 U/L    Comment: Performed at [facility], [place] [address] [place], [place] [address]  Comprehensive metabolic panel     Status: Abnormal   Collection Time: 05/26/19  9:38 PM  Result Value Ref Range   Sodium 139 135 - 145 mmol/L   Potassium 3.4 (L) 3.5 - 5.1 mmol/L   Chloride 105 98 -  111 mmol/L   CO2 24 22 - 32 mmol/L   Glucose, Bld 116 (H) 70 - 99 mg/dL   BUN 13 8 - 23 mg/dL   Creatinine, Ser 0.94 0.44 - 1.00 mg/dL   Calcium 9.3 8.9 - 10.3 mg/dL   Total Protein 7.6 6.5 - 8.1 g/dL   Albumin 4.1 3.5 - 5.0 g/dL   AST 231 (H) 15 - 41 U/L   ALT 372 (H) 0 - 44 U/L   Alkaline Phosphatase 243 (H) 38 - 126 U/L   Total Bilirubin 4.2 (H) 0.3 - 1.2 mg/dL   GFR calc non Af Amer >60 >60 mL/min   GFR calc Af Amer >60 >60 mL/min   Anion gap 10 5 - 15    Comment: Performed at [facility], [place] [address] [place], [place] [address]  CBC     Status: Abnormal   Collection Time: 05/26/19  9:38 PM  Result Value Ref Range   WBC 7.4 4.0 - 10.5 K/uL   RBC 4.95 3.87 - 5.11 MIL/uL   Hemoglobin 14.7 12.0 - 15.0 g/dL   HCT 46.1 (H) 36.0 - 46.0 %   MCV 93.1  80.0 - 100.0 fL   MCH 29.7 26.0 - 34.0 pg   MCHC 31.9 30.0 - 36.0 g/dL   RDW 13.9 11.5 - 15.5 %   Platelets 233 150 - 400 K/uL   nRBC 0.0 0.0 - 0.2 %    Comment: Performed at [facility], [place] [address], [place], [place] [address]  SARS Coronavirus 2 [facility] order, Performed in [facility] hospital lab) Nasopharyngeal Nasopharyngeal Swab     Status: None   Collection Time: 05/27/19  3:59 AM   Specimen: Nasopharyngeal Swab  Result Value Ref Range   SARS Coronavirus 2 NEGATIVE NEGATIVE    Comment: (NOTE) If result is NEGATIVE SARS-CoV-2 target nucleic acids are NOT DETECTED. The SARS-CoV-2 RNA is generally detectable in upper and lower  respiratory specimens during the acute phase of infection. The lowest  concentration of SARS-CoV-2 viral copies this assay can detect is 250  copies / mL. A negative result does not preclude SARS-CoV-2 infection  and should not be used as the sole basis for treatment or other  patient management decisions.  A negative result may occur with  improper specimen collection / handling, submission of specimen other  than nasopharyngeal swab, presence of viral mutation(s) within the  areas targeted by this assay, and inadequate number of viral copies  (<250 copies / mL). A negative result must be combined with clinical  observations, patient history, and epidemiological information. If result is POSITIVE SARS-CoV-2 target nucleic acids are DETECTED. The SARS-CoV-2 RNA is generally detectable in upper and lower  respiratory specimens dur ing the acute phase of infection.  Positive  results are indicative of active infection with SARS-CoV-2.  Clinical  correlation with patient history and other diagnostic information is  necessary to determine patient infection status.  Positive results do  not rule out bacterial infection or co-infection with other viruses. If result is PRESUMPTIVE POSTIVE SARS-CoV-2 nucleic acids MAY BE PRESENT.   A  presumptive positive result was obtained on the submitted specimen  and confirmed on repeat testing.  While 2019 novel coronavirus  (SARS-CoV-2) nucleic acids may be present in the submitted sample  additional confirmatory testing may be necessary for epidemiological  and / or clinical management purposes  to differentiate between  SARS-CoV-2 and other Sarbecovirus currently known to infect humans.  If clinically indicated additional testing with an alternate test  methodology [phone]) is advised. The SARS-CoV-2 RNA is generally  detectable in upper and lower respiratory sp ecimens during the acute  phase of infection. The expected result is Negative. Fact Sheet for Patients:  StrictlyIdeas.no Fact Sheet for Healthcare Providers: BankingDealers.co.za This test is not yet approved or cleared by the [place] FDA and has been authorized for detection and/or diagnosis of SARS-CoV-2 by FDA under an Emergency Use Authorization (EUA).  This EUA will remain in effect (meaning this test can be used) for the duration of the COVID-19 declaration under Section 564(b)(1) of the Act, 21 U.S.C. section 360bbb-3(b)(1), unless the authorization is terminated or revoked sooner. Performed at [facility], [place] [address] [place], [place] [address]   CBC     Status: Abnormal   Collection Time: 05/27/19  6:15 AM  Result Value Ref Range   WBC 11.1 (H) 4.0 - 10.5 K/uL   RBC 4.40 3.87 - 5.11 MIL/uL   Hemoglobin 13.2 12.0 - 15.0 g/dL   HCT 40.3 36.0 - 46.0 %   MCV 91.6 80.0 - 100.0 fL   MCH 30.0 26.0 - 34.0 pg   MCHC 32.8 30.0 - 36.0 g/dL   RDW 14.1 11.5 - 15.5 %   Platelets 221 150 - 400 K/uL   nRBC 0.0 0.0 - 0.2 %    Comment: Performed at [facility], [place] [address], [place], [place] [address]   Ct Abdomen Pelvis W Contrast  Result Date: 05/27/2019 CLINICAL DATA:  68 year old female with generalized  abdominal pain x5 days. EXAM: CT ABDOMEN AND PELVIS WITH CONTRAST TECHNIQUE: Multidetector CT imaging of the abdomen and pelvis was performed using the standard protocol following bolus administration of intravenous contrast. CONTRAST:  118mL OMNIPAQUE IOHEXOL 300 MG/ML  SOLN COMPARISON:  CT dated 08/08/2018. FINDINGS: Lower chest: The visualized lung bases are clear. No intra-abdominal free air or free fluid. Hepatobiliary: Apparent mild fatty infiltration of the liver. No intrahepatic biliary ductal dilatation. Cholecystectomy. No retained calcified stone noted in the central CBD Pancreas: Mild haziness of the peripancreatic and upper abdominal fat concerning for acute pancreatitis. Correlation with pancreatic enzymes recommended. No drainable fluid collection/abscess or pseudocyst. Spleen: Normal in size without focal abnormality. Adrenals/Urinary Tract: The adrenal glands are unremarkable. There is no hydronephrosis on either side. There is symmetric enhancement and excretion of contrast by both kidneys. The visualized ureters and urinary bladder appear unremarkable. Stomach/Bowel: There is sigmoid diverticulosis without active inflammatory changes. There is a 2 cm duodenal diverticulum. There is no bowel obstruction or active inflammation. The appendix is not visualized with certainty. No inflammatory changes identified in the right lower quadrant. Vascular/Lymphatic: Mild atherosclerotic calcification of the aorta. The IVC is unremarkable. No portal venous gas. There is no adenopathy. Reproductive: Hysterectomy. No pelvic mass. Other: Small fat containing umbilical hernia. Musculoskeletal: No acute or significant osseous findings. IMPRESSION: 1. Findings concerning for acute pancreatitis. Correlation with pancreatic enzymes recommended. No drainable fluid collection/abscess or pseudocyst. 2. Sigmoid diverticulosis. No bowel obstruction or active inflammation. Aortic Atherosclerosis (ICD10-I70.0).  Electronically Signed   By: Anner Crete M.D.   On: 05/27/2019 01:39   US Abdomen Limited Ruq  Result Date: 05/27/2019 CLINICAL DATA:  Right upper quadrant pain for 6 months EXAM: ULTRASOUND ABDOMEN LIMITED RIGHT UPPER QUADRANT COMPARISON:  Abdominal CT from earlier today FINDINGS: Gallbladder: Surgically absent Common bile duct: Diameter: 7 mm.  Where visualized, no filling defect. Liver: No focal lesion identified.  Within normal limits in parenchymal echogenicity. Portal vein is patent on color Doppler imaging with normal direction of blood flow towards the liver. IMPRESSION: Negative right upper quadrant ultrasound after cholecystectomy. Electronically Signed   By: Monte Fantasia M.D.   On: 05/27/2019 06:31    Pending Labs Unresulted Labs (From admission, onward)    Start     Ordered   06/03/19 0500  Creatinine, serum  (enoxaparin (LOVENOX)    CrCl >/= 30 ml/min)  Weekly,   R    Comments: while on enoxaparin therapy    05/27/19 0554   05/28/19 0500  Comprehensive metabolic panel  Tomorrow morning,   R     05/27/19 0554   05/28/19 0500  CBC  Tomorrow morning,   R     05/27/19 0554   05/27/19 0555  HIV antibody (Routine Testing)  Once,   STAT     05/27/19 0554   05/27/19 0555  Creatinine, serum  (enoxaparin (LOVENOX)    CrCl >/= 30 ml/min)  Once,   STAT    Comments: Baseline for enoxaparin therapy IF NOT ALREADY DRAWN.    05/27/19 0554   05/26/19 2117  Urinalysis, Routine w reflex microscopic  ONCE - STAT,   STAT     05/26/19 2116          Vitals/Pain Today's Vitals   05/27/19 0500 05/27/19 0530 05/27/19 0600 05/27/19 0630  BP: (!) 115/56 (!) 105/50 (!) 106/51 (!) 95/51  Pulse: 66 63 62 (!) 58  Resp: (!) 21 20 (!) 23 12  Temp:      TempSrc:      SpO2: 93% 93% 94% 92%  Weight:      Height:      PainSc:        Isolation Precautions No active isolations  Medications Medications  sodium chloride (PF) 0.9 % injection (has no administration in time range)   enoxaparin (LOVENOX) injection 40 mg (has no administration in time range)  dextrose 5 % in lactated ringers infusion (has no administration in time range)  ondansetron (ZOFRAN) tablet 4 mg ( Oral See Alternative 05/27/19 0624)    Or  ondansetron (ZOFRAN) injection 4 mg (4 mg Intravenous Given 05/27/19 0624)  fentaNYL (SUBLIMAZE) injection 25 mcg (25 mcg Intravenous Given 05/27/19 0625)  pantoprazole (PROTONIX) injection 40 mg (has no administration in time range)  potassium chloride 10 mEq in 100 mL IVPB (has no administration in time range)  sodium chloride flush (NS) 0.9 % injection 3 mL (3 mLs Intravenous Given 05/27/19 0242)  ondansetron (ZOFRAN-ODT) disintegrating tablet 4 mg (4 mg Oral Given 05/26/19 2142)  metoCLOPramide (REGLAN) injection 10 mg (10 mg Intramuscular Given 05/27/19 0013)  iohexol (OMNIPAQUE) 300 MG/ML solution 100 mL (100 mLs Intravenous Contrast Given 05/27/19 0105)  fentaNYL (SUBLIMAZE) injection 100 mcg (100 mcg Intravenous Given 05/27/19 0351)  ondansetron (ZOFRAN) injection 4 mg (4 mg Intravenous Given 05/27/19 0351)  sodium chloride 0.9 % bolus 1,000 mL (0 mLs Intravenous Stopped 05/27/19 0501)    Mobility walks Low fall risk   Focused Assessments NA   R Recommendations: See Admitting Provider Note  Report given to:   Additional Notes: NA

## 2019-05-28 DIAGNOSIS — F339 Major depressive disorder, recurrent, unspecified: Secondary | ICD-10-CM

## 2019-05-28 DIAGNOSIS — I10 Essential (primary) hypertension: Secondary | ICD-10-CM

## 2019-05-28 DIAGNOSIS — K219 Gastro-esophageal reflux disease without esophagitis: Secondary | ICD-10-CM

## 2019-05-28 DIAGNOSIS — E876 Hypokalemia: Secondary | ICD-10-CM

## 2019-05-28 DIAGNOSIS — K831 Obstruction of bile duct: Secondary | ICD-10-CM

## 2019-05-28 DIAGNOSIS — K8050 Calculus of bile duct without cholangitis or cholecystitis without obstruction: Secondary | ICD-10-CM

## 2019-05-28 LAB — CBC
HCT: 40.7 % (ref 36.0–46.0)
Hemoglobin: 12.6 g/dL (ref 12.0–15.0)
MCH: 29.1 pg (ref 26.0–34.0)
MCHC: 31 g/dL (ref 30.0–36.0)
MCV: 94 fL (ref 80.0–100.0)
Platelets: 211 10*3/uL (ref 150–400)
RBC: 4.33 MIL/uL (ref 3.87–5.11)
RDW: 14.2 % (ref 11.5–15.5)
WBC: 5.8 10*3/uL (ref 4.0–10.5)
nRBC: 0 % (ref 0.0–0.2)

## 2019-05-28 LAB — COMPREHENSIVE METABOLIC PANEL
ALT: 296 U/L — ABNORMAL HIGH (ref 0–44)
AST: 144 U/L — ABNORMAL HIGH (ref 15–41)
Albumin: 3.5 g/dL (ref 3.5–5.0)
Alkaline Phosphatase: 208 U/L — ABNORMAL HIGH (ref 38–126)
Anion gap: 8 (ref 5–15)
BUN: 9 mg/dL (ref 8–23)
CO2: 24 mmol/L (ref 22–32)
Calcium: 8.7 mg/dL — ABNORMAL LOW (ref 8.9–10.3)
Chloride: 108 mmol/L (ref 98–111)
Creatinine, Ser: 0.83 mg/dL (ref 0.44–1.00)
GFR calc Af Amer: >60 mL/min (ref >60)
GFR calc non Af Amer: >60 mL/min (ref >60)
Glucose, Bld: 121 mg/dL — ABNORMAL HIGH (ref 70–99)
Potassium: 3.4 mmol/L — ABNORMAL LOW (ref 3.5–5.1)
Sodium: 140 mmol/L (ref 135–145)
Total Bilirubin: 5 mg/dL — ABNORMAL HIGH (ref 0.3–1.2)
Total Protein: 6.8 g/dL (ref 6.5–8.1)

## 2019-05-28 MED ORDER — POTASSIUM CHLORIDE 10 MEQ/100ML IV SOLN
10.0000 meq | INTRAVENOUS | Status: AC
  Administered 2019-05-28 (×4): 10 meq via INTRAVENOUS
  Filled 2019-05-28 (×4): qty 100

## 2019-05-28 MED ORDER — HYDROMORPHONE HCL 1 MG/ML IJ SOLN
1.0000 mg | INTRAMUSCULAR | Status: DC | PRN
  Administered 2019-05-28 – 2019-05-29 (×8): 1 mg via INTRAVENOUS
  Filled 2019-05-28 (×9): qty 1

## 2019-05-28 MED ORDER — POTASSIUM CHLORIDE 20 MEQ PO PACK
20.0000 meq | PACK | Freq: Once | ORAL | Status: DC
  Filled 2019-05-28: qty 1

## 2019-05-28 MED ORDER — PROMETHAZINE HCL 25 MG/ML IJ SOLN
6.2500 mg | Freq: Four times a day (QID) | INTRAMUSCULAR | Status: DC | PRN
  Administered 2019-05-28 – 2019-05-29 (×4): 6.25 mg via INTRAVENOUS
  Filled 2019-05-28 (×4): qty 1

## 2019-05-28 NOTE — Progress Notes (Addendum)
Progress Note   Subjective  Chief Complaint: Recurrent pancreatitis  This morning, the patient explains that she still continues to hurt, more on her right upper quadrant and around to her back but also her epigastrium.  It feels better if she lays down flat.  Overnight she was able to get some sleep and is happy about that.  Continues with some nausea.  Continues with itching.   Objective   Vital signs in last 24 hours: Temp:  [98 F (36.7 C)-98.4 F (36.9 C)] 98.4 F (36.9 C) (09/23 [id number]) Pulse Rate:  [53-65] 53 (09/23 0642) Resp:  [17-18] 17 (09/23 0642) BP: (112-134)/(57-66) 124/57 (09/23 0642) SpO2:  [95 %-98 %] 95 % (09/23 [id number])   General:    white female in NAD Heart:  Regular rate and rhythm; no murmurs Lungs: Respirations even and unlabored, lungs CTA bilaterally Abdomen:  Soft, marked epigastric and right upper quadrant TTP and nondistended. Normal bowel sounds. Extremities:  Without edema. Neurologic:  Alert and oriented,  grossly normal neurologically. Psych:  Cooperative. Normal mood and affect.  Intake/Output from previous day: 09/22 0701 - 09/23 0700 In: 60 [P.O.:60] Out: 290 [Urine:290] Intake/Output this shift: Total I/O In: -  Out: 300 [Urine:300]  Lab Results: Recent Labs    05/26/19 2138 05/27/19 0615 05/28/19 0518  WBC 7.4 11.1* 5.8  HGB 14.7 13.2 12.6  HCT 46.1* 40.3 40.7  PLT 233 221 211   BMET Recent Labs    05/26/19 2138 05/27/19 0615 05/28/19 0518  NA 139 137 140  K 3.4* 3.8 3.4*  CL 105 107 108  CO2 24 21* 24  GLUCOSE 116* 119* 121*  BUN 13 12 9   CREATININE 0.94 0.76   0.80 0.83  CALCIUM 9.3 8.6* 8.7*   LFT Recent Labs    05/28/19 0518  PROT 6.8  ALBUMIN 3.5  AST 144*  ALT 296*  ALKPHOS 208*  BILITOT 5.0*   Studies/Results: Ct Abdomen Pelvis W Contrast  Result Date: 05/27/2019 CLINICAL DATA:  68 year old female with generalized abdominal pain x5 days. EXAM: CT ABDOMEN AND PELVIS WITH CONTRAST TECHNIQUE:  Multidetector CT imaging of the abdomen and pelvis was performed using the standard protocol following bolus administration of intravenous contrast. CONTRAST:  174mL OMNIPAQUE IOHEXOL 300 MG/ML  SOLN COMPARISON:  CT dated 08/08/2018. FINDINGS: Lower chest: The visualized lung bases are clear. No intra-abdominal free air or free fluid. Hepatobiliary: Apparent mild fatty infiltration of the liver. No intrahepatic biliary ductal dilatation. Cholecystectomy. No retained calcified stone noted in the central CBD Pancreas: Mild haziness of the peripancreatic and upper abdominal fat concerning for acute pancreatitis. Correlation with pancreatic enzymes recommended. No drainable fluid collection/abscess or pseudocyst. Spleen: Normal in size without focal abnormality. Adrenals/Urinary Tract: The adrenal glands are unremarkable. There is no hydronephrosis on either side. There is symmetric enhancement and excretion of contrast by both kidneys. The visualized ureters and urinary bladder appear unremarkable. Stomach/Bowel: There is sigmoid diverticulosis without active inflammatory changes. There is a 2 cm duodenal diverticulum. There is no bowel obstruction or active inflammation. The appendix is not visualized with certainty. No inflammatory changes identified in the right lower quadrant. Vascular/Lymphatic: Mild atherosclerotic calcification of the aorta. The IVC is unremarkable. No portal venous gas. There is no adenopathy. Reproductive: Hysterectomy. No pelvic mass. Other: Small fat containing umbilical hernia. Musculoskeletal: No acute or significant osseous findings. IMPRESSION: 1. Findings concerning for acute pancreatitis. Correlation with pancreatic enzymes recommended. No drainable fluid collection/abscess or pseudocyst. 2. Sigmoid diverticulosis. No  bowel obstruction or active inflammation. Aortic Atherosclerosis (ICD10-I70.0). Electronically Signed   By: Anner Crete M.D.   On: 05/27/2019 01:39   Mr 3d Recon  At Scanner  Result Date: 05/27/2019 CLINICAL DATA:  Jaundice, abdominal pain, fevers. EXAM: MRI ABDOMEN WITHOUT AND WITH CONTRAST (INCLUDING MRCP) TECHNIQUE: Multiplanar multisequence MR imaging of the abdomen was performed both before and after the administration of intravenous contrast. Heavily T2-weighted images of the biliary and pancreatic ducts were obtained, and three-dimensional MRCP images were rendered by post processing. CONTRAST:  9mL GADAVIST GADOBUTROL 1 MMOL/ML IV SOLN COMPARISON:  05/27/2019 FINDINGS: Lower chest: No acute findings. Hepatobiliary: Mild hepatic steatosis. Hypertrophy of the lateral segment of left lobe of liver and caudate lobe of liver identified. Heterogeneous early arterial phase enhancement with uniform enhancement on the portal venous and delayed phase images. No focal liver abnormality. Previous cholecystectomy. Mild intrahepatic bile duct dilatation. The common bile duct is increased in caliber measuring up to 9 mm, image 73/5. Stone within the distal CBD measures 6 mm in diameter with a length of 9 mm. Pancreas: No main duct dilatation. Mild edema/inflammation involving the head of pancreas. No mass. Spleen:  Spleen is normal. Adrenals/Urinary Tract: Normal adrenal glands. Several small cystic lesions identified within the left kidney which are too small to reliably characterize. No hydronephrosis. Stomach/Bowel: Visualized portions within the abdomen are unremarkable. Vascular/Lymphatic: Normal appearance of the abdominal aorta. No abdominopelvic adenopathy. The portal vein and splenic vein remain patent. Intrahepatic veins are patent. Other:  No significant free fluid or fluid collection. Musculoskeletal: No suspicious bone lesions identified. IMPRESSION: 1. Common bile duct dilatation with 6 x 9 mm distal CBD stone. 2. Mild pancreatitis. No evidence for pseudocyst or pancreatic necrosis. 3. Hepatic steatosis with morphologic features of liver suggestive of early  cirrhosis. Electronically Signed   By: Kerby Moors M.D.   On: 05/27/2019 18:44   Mr Abdomen Mrcp Moise Boring Contast  Result Date: 05/27/2019 CLINICAL DATA:  Jaundice, abdominal pain, fevers. EXAM: MRI ABDOMEN WITHOUT AND WITH CONTRAST (INCLUDING MRCP) TECHNIQUE: Multiplanar multisequence MR imaging of the abdomen was performed both before and after the administration of intravenous contrast. Heavily T2-weighted images of the biliary and pancreatic ducts were obtained, and three-dimensional MRCP images were rendered by post processing. CONTRAST:  66mL GADAVIST GADOBUTROL 1 MMOL/ML IV SOLN COMPARISON:  05/27/2019 FINDINGS: Lower chest: No acute findings. Hepatobiliary: Mild hepatic steatosis. Hypertrophy of the lateral segment of left lobe of liver and caudate lobe of liver identified. Heterogeneous early arterial phase enhancement with uniform enhancement on the portal venous and delayed phase images. No focal liver abnormality. Previous cholecystectomy. Mild intrahepatic bile duct dilatation. The common bile duct is increased in caliber measuring up to 9 mm, image 73/5. Stone within the distal CBD measures 6 mm in diameter with a length of 9 mm. Pancreas: No main duct dilatation. Mild edema/inflammation involving the head of pancreas. No mass. Spleen:  Spleen is normal. Adrenals/Urinary Tract: Normal adrenal glands. Several small cystic lesions identified within the left kidney which are too small to reliably characterize. No hydronephrosis. Stomach/Bowel: Visualized portions within the abdomen are unremarkable. Vascular/Lymphatic: Normal appearance of the abdominal aorta. No abdominopelvic adenopathy. The portal vein and splenic vein remain patent. Intrahepatic veins are patent. Other:  No significant free fluid or fluid collection. Musculoskeletal: No suspicious bone lesions identified. IMPRESSION: 1. Common bile duct dilatation with 6 x 9 mm distal CBD stone. 2. Mild pancreatitis. No evidence for pseudocyst or  pancreatic necrosis. 3. Hepatic  steatosis with morphologic features of liver suggestive of early cirrhosis. Electronically Signed   By: Kerby Moors M.D.   On: 05/27/2019 18:44   US Abdomen Limited Ruq  Result Date: 05/27/2019 CLINICAL DATA:  Right upper quadrant pain for 6 months EXAM: ULTRASOUND ABDOMEN LIMITED RIGHT UPPER QUADRANT COMPARISON:  Abdominal CT from earlier today FINDINGS: Gallbladder: Surgically absent Common bile duct: Diameter: 7 mm.  Where visualized, no filling defect. Liver: No focal lesion identified. Within normal limits in parenchymal echogenicity. Portal vein is patent on color Doppler imaging with normal direction of blood flow towards the liver. IMPRESSION: Negative right upper quadrant ultrasound after cholecystectomy. Electronically Signed   By: Monte Fantasia M.D.   On: 05/27/2019 06:31    Assessment / Plan:   Assessment: 1.  Acute recurrent pancreatitis: With choledocholithiasis as seen on MRCP 05/27/2019 2.  GERD: Controlled on PPI  Plan: 1.  Planning for ERCP tomorrow with Dr. Rush Landmark.  Did discuss risks, benefits, limitations and alternatives and the patient agrees to proceed. 2.  Continue to monitor LFTs 3.  Continue IV fluids and pain control 4.  Continue antibiotics 5.  Please await any further recommendations from Dr. Loletha Carrow later today  Thank you for kind consultation.  We will continue to follow.   LOS: 1 day   Levin Erp  05/28/2019, 10:47 AM   I have discussed the case with the PA, and that is the plan I formulated. I personally interviewed and examined the patient.  Her pancreatitis is clinically improved, LFTs decreasing, but she still has tenderness and symptoms from the CBD stone now found on MRCP.  She is not showing signs of cholangitis, but I feel we should keep her on antibiotics to be safe.  We discussed ERCP with her at length.  She is familiar with the procedure from having had it done multiple times in the past.   Previous reports indicate that it is a somewhat technically challenging procedure due to the large duodenal/periampullary diverticulum, which has precluded cannulation of the pancreatic duct in the past.  However, this procedure would be done to cannulate the bile duct without attention to the pancreatic duct.  Last ERCP in [place] within the last year was able to cannulate the bile duct, at least partially because there is a previous sphincterotomy.  Therefore, I feel that 1 of our more advanced biliary endoscopist's would be able to achieve cannulation and ductal clearance.  As it happens, Dr. Valarie Merino Mansouraty can be available tomorrow, and I discussed the case with him at length.  Ms. Basha was glad to hear that, and agreeable to letting him do the ERCP.  Risks and benefits of the procedure were reviewed in detail.  The benefits and risks of the planned procedure were described in detail with the patient or (when appropriate) their health care proxy.  Risks were outlined as including, but not limited to, bleeding, infection, perforation, adverse medication reaction leading to cardiac or pulmonary decompensation.  Worsening of pancreatitis can occur from the ERCP.  The limitation of incomplete mucosal visualization was also discussed.  No guarantees or warranties were given.   Maintain current IV fluids of lactated Ringer's 125 cc an hour, n.p.o. except ice chips and water, ERCP tomorrow.  [place] minutes  Nelida Meuse III Office: [phone]

## 2019-05-28 NOTE — Progress Notes (Signed)
PROGRESS NOTE    Linda Blackburn  ENI:[id number] DOB: February 13, 1951 DOA: 05/26/2019 PCP: [name], MD    Brief Narrative:  68 year old female who presented with abdominal pain, she does have significant past medical history for recurrent idiopathic pancreatitis.  She reported epigastric abdominal pain 10 out of 10 in intensity, associated with nausea and vomiting.  On her initial physical examination temperature was 98.2, blood pressure 92/50, pulse rate 78, respiratory 25, oxygen saturation 89%, mucous membranes, lungs clear to auscultation bilaterally, heart S1-S2 present rhythm, abdomen with diffuse epigastric right upper quadrant abdominal pain and tenderness, no lower extremity edema.  Sodium 139, potassium 3.4, chloride 105, bicarb 25, glucose 116, BUN 13, creatinine 0.94, alkaline phosphatase 243, lipase 29, AST 231, ALT 372, total bilirubin is 4.2. CT of the abdomen with mild haziness of the peripancreatic and upper abdominal fat concerning for acute pancreatitis.  No intrahepatic duct dilatation, status post cholecystectomy.  The patient was admitted to the hospital with a working diagnosis of recurrent pancreatitis, complicated by elevated liver enzymes, hyperbilirubinemia and hypotension.  Further work-up with MRCP showed bile duct dilatation with 6 x 9 mm distal common bile duct stone.  Mild pancreatitis.  No evidence of pseudocyst or pancreatic necrosis.  Hepatic steatosis.  Assessment & Plan:   Principal Problem:   Acute recurrent pancreatitis Active Problems:   Depression, recurrent (HCC)   Hypertension   Hypokalemia   Gastroesophageal reflux disease   1. Acute pancreatitis due to common bile duct stone. Patient continue to have abdominal pain and nausea. Wbc at 5,8 with T bilirubins at 6,8, Alk P 208 AST 144 and ALT 296. Will continue supportive medical therapy with IV antibiotic therapy with Zosyn, IV fluids and as needed antiemetics. Will change from morphine to  hydromorphone and will add phenergan. Continue close monitoring. Patient scheduled for ERCP in am.   2. HTN. Continue blood pressure monitoring, will hold on antihypertensive medications for now due to risk of hypotension.  3. Hypokalemia. Continue K correction with IV kcl 40 meq, patient not tolerating po well. Preserved renal function with serum cr at 0,83, follow on renal panel in am.  4. Depression. Patient on sertraline.   5. Obesity. Calculated BMI is 38,7.   DVT prophylaxis: enoxaparin   Code Status: full Family Communication: no family at the bedside  Disposition Plan/ discharge barriers: pending ERCP in am.   Body mass index is 38.71 kg/m. Malnutrition Type:      Malnutrition Characteristics:      Nutrition Interventions:     RN Pressure Injury Documentation:     Consultants:   GI   Procedures:     Antimicrobials:   IV Zosyn.     Subjective: Patient continue to have significant abdominal pain. Colicky and dull in nature worse to touch and movement, associated with nausea but not vomiting, has been refractive to IV morphine.   Objective: Vitals:   05/27/19 1301 05/27/19 2116 05/28/19 0638 05/28/19 0642  BP: (!) 112/57 134/66  (!) 124/57  Pulse: 65 (!) 57 (!) 57 (!) 53  Resp: 18 17 17 17   Temp: 98.3 F (36.8 C) 98 F (36.7 C) 98 F (36.7 C) 98.4 F (36.9 C)  TempSrc: Oral Oral Oral Oral  SpO2: 98% 95% 96% 95%  Weight:      Height:        Intake/Output Summary (Last 24 hours) at 05/28/2019 1138 Last data filed at 05/28/2019 1045 Gross per 24 hour  Intake -  Output 300  ml  Net -300 ml   Filed Weights   05/26/19 2044 05/27/19 0842  Weight: 90.7 kg 96 kg    Examination:   General: Deconditioned and in pain Neurology: Awake and alert, non focal  E ENT: positive pallor and  icterus, oral mucosa moist Cardiovascular: No JVD. S1-S2 present, rhythmic, no gallops, rubs, or murmurs. No lower extremity edema. Pulmonary: vesicular breath  sounds bilaterally, adequate air movement, no wheezing, rhonchi or rales. Gastrointestinal. Abdomen mild distended with positive tenderness to superficial palpation with no organomegaly.  Skin. No rashes Musculoskeletal: no joint deformities     Data Reviewed: I have personally reviewed following labs and imaging studies  CBC: Recent Labs  Lab 05/26/19 2138 05/27/19 0615 05/28/19 0518  WBC 7.4 11.1* 5.8  HGB 14.7 13.2 12.6  HCT 46.1* 40.3 40.7  MCV 93.1 91.6 94.0  PLT 233 221 100   Basic Metabolic Panel: Recent Labs  Lab 05/26/19 2138 05/27/19 0615 05/28/19 0518  NA 139 137 140  K 3.4* 3.8 3.4*  CL 105 107 108  CO2 24 21* 24  GLUCOSE 116* 119* 121*  BUN 13 12 9   CREATININE 0.94 0.76  0.80 0.83  CALCIUM 9.3 8.6* 8.7*   GFR: Estimated Creatinine Clearance: 68.2 mL/min (by C-G formula based on SCr of 0.83 mg/dL). Liver Function Tests: Recent Labs  Lab 05/26/19 2138 05/27/19 0615 05/28/19 0518  AST 231* 222* 144*  ALT 372* 354* 296*  ALKPHOS 243* 219* 208*  BILITOT 4.2* 5.4* 5.0*  PROT 7.6 7.0 6.8  ALBUMIN 4.1 3.4* 3.5   Recent Labs  Lab 05/26/19 2138  LIPASE 29   No results for input(s): AMMONIA in the last 168 hours. Coagulation Profile: No results for input(s): INR, PROTIME in the last 168 hours. Cardiac Enzymes: No results for input(s): CKTOTAL, CKMB, CKMBINDEX, TROPONINI in the last 168 hours. BNP (last 3 results) No results for input(s): PROBNP in the last 8760 hours. HbA1C: No results for input(s): HGBA1C in the last 72 hours. CBG: No results for input(s): GLUCAP in the last 168 hours. Lipid Profile: No results for input(s): CHOL, HDL, LDLCALC, TRIG, CHOLHDL, LDLDIRECT in the last 72 hours. Thyroid Function Tests: No results for input(s): TSH, T4TOTAL, FREET4, T3FREE, THYROIDAB in the last 72 hours. Anemia Panel: No results for input(s): VITAMINB12, FOLATE, FERRITIN, TIBC, IRON, RETICCTPCT in the last 72 hours.    Radiology Studies: I  have reviewed all of the imaging during this hospital visit personally     Scheduled Meds: . pantoprazole (PROTONIX) IV  40 mg Intravenous Daily  . potassium chloride  20 mEq Oral Once  . sertraline  100 mg Oral Daily   Continuous Infusions: . dextrose 5% lactated ringers 125 mL/hr at 05/28/19 1003  . piperacillin-tazobactam (ZOSYN)  IV 3.375 g (05/28/19 1006)     LOS: 1 day        Mauricio Gerome Apley, MD

## 2019-05-28 NOTE — H&P (View-Only) (Signed)
Progress Note   Subjective  Chief Complaint: Recurrent pancreatitis  This morning, the patient explains that she still continues to hurt, more on her right upper quadrant and around to her back but also her epigastrium.  It feels better if she lays down flat.  Overnight she was able to get some sleep and is happy about that.  Continues with some nausea.  Continues with itching.   Objective   Vital signs in last 24 hours: Temp:  [98 F (36.7 C)-98.4 F (36.9 C)] 98.4 F (36.9 C) (09/23 [id number]) Pulse Rate:  [53-65] 53 (09/23 0642) Resp:  [17-18] 17 (09/23 0642) BP: (112-134)/(57-66) 124/57 (09/23 0642) SpO2:  [95 %-98 %] 95 % (09/23 [id number])   General:    white female in NAD Heart:  Regular rate and rhythm; no murmurs Lungs: Respirations even and unlabored, lungs CTA bilaterally Abdomen:  Soft, marked epigastric and right upper quadrant TTP and nondistended. Normal bowel sounds. Extremities:  Without edema. Neurologic:  Alert and oriented,  grossly normal neurologically. Psych:  Cooperative. Normal mood and affect.  Intake/Output from previous day: 09/22 0701 - 09/23 0700 In: 60 [P.O.:60] Out: 290 [Urine:290] Intake/Output this shift: Total I/O In: -  Out: 300 [Urine:300]  Lab Results: Recent Labs    05/26/19 2138 05/27/19 0615 05/28/19 0518  WBC 7.4 11.1* 5.8  HGB 14.7 13.2 12.6  HCT 46.1* 40.3 40.7  PLT 233 221 211   BMET Recent Labs    05/26/19 2138 05/27/19 0615 05/28/19 0518  NA 139 137 140  K 3.4* 3.8 3.4*  CL 105 107 108  CO2 24 21* 24  GLUCOSE 116* 119* 121*  BUN 13 12 9   CREATININE 0.94 0.76   0.80 0.83  CALCIUM 9.3 8.6* 8.7*   LFT Recent Labs    05/28/19 0518  PROT 6.8  ALBUMIN 3.5  AST 144*  ALT 296*  ALKPHOS 208*  BILITOT 5.0*   Studies/Results: Ct Abdomen Pelvis W Contrast  Result Date: 05/27/2019 CLINICAL DATA:  68 year old female with generalized abdominal pain x5 days. EXAM: CT ABDOMEN AND PELVIS WITH CONTRAST TECHNIQUE:  Multidetector CT imaging of the abdomen and pelvis was performed using the standard protocol following bolus administration of intravenous contrast. CONTRAST:  172mL OMNIPAQUE IOHEXOL 300 MG/ML  SOLN COMPARISON:  CT dated 08/08/2018. FINDINGS: Lower chest: The visualized lung bases are clear. No intra-abdominal free air or free fluid. Hepatobiliary: Apparent mild fatty infiltration of the liver. No intrahepatic biliary ductal dilatation. Cholecystectomy. No retained calcified stone noted in the central CBD Pancreas: Mild haziness of the peripancreatic and upper abdominal fat concerning for acute pancreatitis. Correlation with pancreatic enzymes recommended. No drainable fluid collection/abscess or pseudocyst. Spleen: Normal in size without focal abnormality. Adrenals/Urinary Tract: The adrenal glands are unremarkable. There is no hydronephrosis on either side. There is symmetric enhancement and excretion of contrast by both kidneys. The visualized ureters and urinary bladder appear unremarkable. Stomach/Bowel: There is sigmoid diverticulosis without active inflammatory changes. There is a 2 cm duodenal diverticulum. There is no bowel obstruction or active inflammation. The appendix is not visualized with certainty. No inflammatory changes identified in the right lower quadrant. Vascular/Lymphatic: Mild atherosclerotic calcification of the aorta. The IVC is unremarkable. No portal venous gas. There is no adenopathy. Reproductive: Hysterectomy. No pelvic mass. Other: Small fat containing umbilical hernia. Musculoskeletal: No acute or significant osseous findings. IMPRESSION: 1. Findings concerning for acute pancreatitis. Correlation with pancreatic enzymes recommended. No drainable fluid collection/abscess or pseudocyst. 2. Sigmoid diverticulosis. No  bowel obstruction or active inflammation. Aortic Atherosclerosis (ICD10-I70.0). Electronically Signed   By: Anner Crete M.D.   On: 05/27/2019 01:39   Mr 3d Recon  At Scanner  Result Date: 05/27/2019 CLINICAL DATA:  Jaundice, abdominal pain, fevers. EXAM: MRI ABDOMEN WITHOUT AND WITH CONTRAST (INCLUDING MRCP) TECHNIQUE: Multiplanar multisequence MR imaging of the abdomen was performed both before and after the administration of intravenous contrast. Heavily T2-weighted images of the biliary and pancreatic ducts were obtained, and three-dimensional MRCP images were rendered by post processing. CONTRAST:  37mL GADAVIST GADOBUTROL 1 MMOL/ML IV SOLN COMPARISON:  05/27/2019 FINDINGS: Lower chest: No acute findings. Hepatobiliary: Mild hepatic steatosis. Hypertrophy of the lateral segment of left lobe of liver and caudate lobe of liver identified. Heterogeneous early arterial phase enhancement with uniform enhancement on the portal venous and delayed phase images. No focal liver abnormality. Previous cholecystectomy. Mild intrahepatic bile duct dilatation. The common bile duct is increased in caliber measuring up to 9 mm, image 73/5. Stone within the distal CBD measures 6 mm in diameter with a length of 9 mm. Pancreas: No main duct dilatation. Mild edema/inflammation involving the head of pancreas. No mass. Spleen:  Spleen is normal. Adrenals/Urinary Tract: Normal adrenal glands. Several small cystic lesions identified within the left kidney which are too small to reliably characterize. No hydronephrosis. Stomach/Bowel: Visualized portions within the abdomen are unremarkable. Vascular/Lymphatic: Normal appearance of the abdominal aorta. No abdominopelvic adenopathy. The portal vein and splenic vein remain patent. Intrahepatic veins are patent. Other:  No significant free fluid or fluid collection. Musculoskeletal: No suspicious bone lesions identified. IMPRESSION: 1. Common bile duct dilatation with 6 x 9 mm distal CBD stone. 2. Mild pancreatitis. No evidence for pseudocyst or pancreatic necrosis. 3. Hepatic steatosis with morphologic features of liver suggestive of early  cirrhosis. Electronically Signed   By: Kerby Moors M.D.   On: 05/27/2019 18:44   Mr Abdomen Mrcp Moise Boring Contast  Result Date: 05/27/2019 CLINICAL DATA:  Jaundice, abdominal pain, fevers. EXAM: MRI ABDOMEN WITHOUT AND WITH CONTRAST (INCLUDING MRCP) TECHNIQUE: Multiplanar multisequence MR imaging of the abdomen was performed both before and after the administration of intravenous contrast. Heavily T2-weighted images of the biliary and pancreatic ducts were obtained, and three-dimensional MRCP images were rendered by post processing. CONTRAST:  31mL GADAVIST GADOBUTROL 1 MMOL/ML IV SOLN COMPARISON:  05/27/2019 FINDINGS: Lower chest: No acute findings. Hepatobiliary: Mild hepatic steatosis. Hypertrophy of the lateral segment of left lobe of liver and caudate lobe of liver identified. Heterogeneous early arterial phase enhancement with uniform enhancement on the portal venous and delayed phase images. No focal liver abnormality. Previous cholecystectomy. Mild intrahepatic bile duct dilatation. The common bile duct is increased in caliber measuring up to 9 mm, image 73/5. Stone within the distal CBD measures 6 mm in diameter with a length of 9 mm. Pancreas: No main duct dilatation. Mild edema/inflammation involving the head of pancreas. No mass. Spleen:  Spleen is normal. Adrenals/Urinary Tract: Normal adrenal glands. Several small cystic lesions identified within the left kidney which are too small to reliably characterize. No hydronephrosis. Stomach/Bowel: Visualized portions within the abdomen are unremarkable. Vascular/Lymphatic: Normal appearance of the abdominal aorta. No abdominopelvic adenopathy. The portal vein and splenic vein remain patent. Intrahepatic veins are patent. Other:  No significant free fluid or fluid collection. Musculoskeletal: No suspicious bone lesions identified. IMPRESSION: 1. Common bile duct dilatation with 6 x 9 mm distal CBD stone. 2. Mild pancreatitis. No evidence for pseudocyst or  pancreatic necrosis. 3. Hepatic  steatosis with morphologic features of liver suggestive of early cirrhosis. Electronically Signed   By: Kerby Moors M.D.   On: 05/27/2019 18:44   US Abdomen Limited Ruq  Result Date: 05/27/2019 CLINICAL DATA:  Right upper quadrant pain for 6 months EXAM: ULTRASOUND ABDOMEN LIMITED RIGHT UPPER QUADRANT COMPARISON:  Abdominal CT from earlier today FINDINGS: Gallbladder: Surgically absent Common bile duct: Diameter: 7 mm.  Where visualized, no filling defect. Liver: No focal lesion identified. Within normal limits in parenchymal echogenicity. Portal vein is patent on color Doppler imaging with normal direction of blood flow towards the liver. IMPRESSION: Negative right upper quadrant ultrasound after cholecystectomy. Electronically Signed   By: Monte Fantasia M.D.   On: 05/27/2019 06:31    Assessment / Plan:   Assessment: 1.  Acute recurrent pancreatitis: With choledocholithiasis as seen on MRCP 05/27/2019 2.  GERD: Controlled on PPI  Plan: 1.  Planning for ERCP tomorrow with Dr. Rush Landmark.  Did discuss risks, benefits, limitations and alternatives and the patient agrees to proceed. 2.  Continue to monitor LFTs 3.  Continue IV fluids and pain control 4.  Continue antibiotics 5.  Please await any further recommendations from Dr. Loletha Carrow later today  Thank you for kind consultation.  We will continue to follow.   LOS: 1 day   Levin Erp  05/28/2019, 10:47 AM   I have discussed the case with the PA, and that is the plan I formulated. I personally interviewed and examined the patient.  Her pancreatitis is clinically improved, LFTs decreasing, but she still has tenderness and symptoms from the CBD stone now found on MRCP.  She is not showing signs of cholangitis, but I feel we should keep her on antibiotics to be safe.  We discussed ERCP with her at length.  She is familiar with the procedure from having had it done multiple times in the past.   Previous reports indicate that it is a somewhat technically challenging procedure due to the large duodenal/periampullary diverticulum, which has precluded cannulation of the pancreatic duct in the past.  However, this procedure would be done to cannulate the bile duct without attention to the pancreatic duct.  Last ERCP in [place] within the last year was able to cannulate the bile duct, at least partially because there is a previous sphincterotomy.  Therefore, I feel that 1 of our more advanced biliary endoscopist's would be able to achieve cannulation and ductal clearance.  As it happens, Dr. Valarie Merino Mansouraty can be available tomorrow, and I discussed the case with him at length.  Ms. Dimitriou was glad to hear that, and agreeable to letting him do the ERCP.  Risks and benefits of the procedure were reviewed in detail.  The benefits and risks of the planned procedure were described in detail with the patient or (when appropriate) their health care proxy.  Risks were outlined as including, but not limited to, bleeding, infection, perforation, adverse medication reaction leading to cardiac or pulmonary decompensation.  Worsening of pancreatitis can occur from the ERCP.  The limitation of incomplete mucosal visualization was also discussed.  No guarantees or warranties were given.   Maintain current IV fluids of lactated Ringer's 125 cc an hour, n.p.o. except ice chips and water, ERCP tomorrow.  [place] minutes  Nelida Meuse III Office: [phone]

## 2019-05-29 ENCOUNTER — Encounter (HOSPITAL_COMMUNITY): Payer: Self-pay

## 2019-05-29 ENCOUNTER — Inpatient Hospital Stay (HOSPITAL_COMMUNITY): Payer: Medicare Other | Admitting: Certified Registered"

## 2019-05-29 ENCOUNTER — Encounter (HOSPITAL_COMMUNITY)
Admission: EM | Disposition: A | Discharge status: Home / Self Care | Payer: Self-pay | Source: Home / Self Care | Attending: Internal Medicine

## 2019-05-29 ENCOUNTER — Inpatient Hospital Stay (HOSPITAL_COMMUNITY): Payer: Medicare Other

## 2019-05-29 DIAGNOSIS — K317 Polyp of stomach and duodenum: Secondary | ICD-10-CM

## 2019-05-29 HISTORY — PX: ERCP: SHX5425

## 2019-05-29 HISTORY — PX: REMOVAL OF STONES: SHX5545

## 2019-05-29 HISTORY — PX: BILIARY DILATION: SHX6850

## 2019-05-29 HISTORY — PX: ESOPHAGOGASTRODUODENOSCOPY (EGD) WITH PROPOFOL: SHX5813

## 2019-05-29 HISTORY — PX: POLYPECTOMY: SHX5525

## 2019-05-29 HISTORY — PX: BIOPSY: SHX5522

## 2019-05-29 LAB — CBC WITH DIFFERENTIAL/PLATELET
Abs Immature Granulocytes: 0.03 10*3/uL (ref 0.00–0.07)
Basophils Absolute: 0.1 10*3/uL (ref 0.0–0.1)
Basophils Relative: 1 %
Eosinophils Absolute: 0.2 10*3/uL (ref 0.0–0.5)
Eosinophils Relative: 4 %
HCT: 41.2 % (ref 36.0–46.0)
Hemoglobin: 12.5 g/dL (ref 12.0–15.0)
Immature Granulocytes: 1 %
Lymphocytes Relative: 37 %
Lymphs Abs: 2.2 10*3/uL (ref 0.7–4.0)
MCH: 29.3 pg (ref 26.0–34.0)
MCHC: 30.3 g/dL (ref 30.0–36.0)
MCV: 96.7 fL (ref 80.0–100.0)
Monocytes Absolute: 0.5 10*3/uL (ref 0.1–1.0)
Monocytes Relative: 8 %
Neutro Abs: 3 10*3/uL (ref 1.7–7.7)
Neutrophils Relative %: 49 %
Platelets: 230 10*3/uL (ref 150–400)
RBC: 4.26 MIL/uL (ref 3.87–5.11)
RDW: 14.6 % (ref 11.5–15.5)
WBC: 6 10*3/uL (ref 4.0–10.5)
nRBC: 0 % (ref 0.0–0.2)

## 2019-05-29 LAB — COMPREHENSIVE METABOLIC PANEL
ALT: 233 U/L — ABNORMAL HIGH (ref 0–44)
AST: 100 U/L — ABNORMAL HIGH (ref 15–41)
Albumin: 3.4 g/dL — ABNORMAL LOW (ref 3.5–5.0)
Alkaline Phosphatase: 199 U/L — ABNORMAL HIGH (ref 38–126)
Anion gap: 9 (ref 5–15)
BUN: 10 mg/dL (ref 8–23)
CO2: 24 mmol/L (ref 22–32)
Calcium: 8.7 mg/dL — ABNORMAL LOW (ref 8.9–10.3)
Chloride: 106 mmol/L (ref 98–111)
Creatinine, Ser: 0.96 mg/dL (ref 0.44–1.00)
GFR calc Af Amer: >60 mL/min (ref >60)
GFR calc non Af Amer: 60 mL/min — ABNORMAL LOW (ref >60)
Glucose, Bld: 92 mg/dL (ref 70–99)
Potassium: 3.7 mmol/L (ref 3.5–5.1)
Sodium: 139 mmol/L (ref 135–145)
Total Bilirubin: 2.8 mg/dL — ABNORMAL HIGH (ref 0.3–1.2)
Total Protein: 6.6 g/dL (ref 6.5–8.1)

## 2019-05-29 SURGERY — ERCP, WITH INTERVENTION IF INDICATED
Anesthesia: General

## 2019-05-29 MED ORDER — GLUCAGON HCL RDNA (DIAGNOSTIC) 1 MG IJ SOLR
INTRAMUSCULAR | Status: AC
  Filled 2019-05-29: qty 1

## 2019-05-29 MED ORDER — SUGAMMADEX SODIUM 200 MG/2ML IV SOLN
INTRAVENOUS | Status: DC | PRN
  Administered 2019-05-29: 200 mg via INTRAVENOUS

## 2019-05-29 MED ORDER — LIDOCAINE 2% (20 MG/ML) 5 ML SYRINGE
INTRAMUSCULAR | Status: DC | PRN
  Administered 2019-05-29: 60 mg via INTRAVENOUS

## 2019-05-29 MED ORDER — LACTATED RINGERS IV SOLN
INTRAVENOUS | Status: DC | PRN
  Administered 2019-05-29: 13:00:00 via INTRAVENOUS

## 2019-05-29 MED ORDER — FENTANYL CITRATE (PF) 100 MCG/2ML IJ SOLN
INTRAMUSCULAR | Status: AC
  Filled 2019-05-29: qty 2

## 2019-05-29 MED ORDER — ONDANSETRON HCL 4 MG/2ML IJ SOLN
INTRAMUSCULAR | Status: DC | PRN
  Administered 2019-05-29: 4 mg via INTRAVENOUS

## 2019-05-29 MED ORDER — PHENYLEPHRINE 40 MCG/ML (10ML) SYRINGE FOR IV PUSH (FOR BLOOD PRESSURE SUPPORT)
PREFILLED_SYRINGE | INTRAVENOUS | Status: DC | PRN
  Administered 2019-05-29 (×2): 80 ug via INTRAVENOUS

## 2019-05-29 MED ORDER — ROCURONIUM BROMIDE 100 MG/10ML IV SOLN
INTRAVENOUS | Status: DC | PRN
  Administered 2019-05-29: 50 mg via INTRAVENOUS

## 2019-05-29 MED ORDER — MIDAZOLAM HCL 2 MG/2ML IJ SOLN
INTRAMUSCULAR | Status: AC
  Filled 2019-05-29: qty 2

## 2019-05-29 MED ORDER — GLUCAGON HCL RDNA (DIAGNOSTIC) 1 MG IJ SOLR
INTRAMUSCULAR | Status: DC | PRN
  Administered 2019-05-29: .25 mg via INTRAVENOUS

## 2019-05-29 MED ORDER — PROPOFOL 10 MG/ML IV BOLUS
INTRAVENOUS | Status: DC | PRN
  Administered 2019-05-29: 160 mg via INTRAVENOUS

## 2019-05-29 MED ORDER — DEXAMETHASONE SODIUM PHOSPHATE 10 MG/ML IJ SOLN
INTRAMUSCULAR | Status: DC | PRN
  Administered 2019-05-29: 10 mg via INTRAVENOUS

## 2019-05-29 MED ORDER — FENTANYL CITRATE (PF) 100 MCG/2ML IJ SOLN
INTRAMUSCULAR | Status: DC | PRN
  Administered 2019-05-29 (×2): 50 ug via INTRAVENOUS

## 2019-05-29 MED ORDER — SODIUM CHLORIDE 0.9 % IV SOLN
INTRAVENOUS | Status: DC | PRN
  Administered 2019-05-29: 40 mL

## 2019-05-29 MED ORDER — INDOMETHACIN 50 MG RE SUPP
RECTAL | Status: AC
  Filled 2019-05-29: qty 2

## 2019-05-29 MED ORDER — INDOMETHACIN 50 MG RE SUPP
RECTAL | Status: DC | PRN
  Administered 2019-05-29: 100 mg via RECTAL

## 2019-05-29 NOTE — Anesthesia Preprocedure Evaluation (Signed)
Anesthesia Evaluation  Patient identified by MRN, date of birth, ID band Patient awake    Reviewed: Allergy & Precautions, H&P , NPO status , Patient's Chart, lab work & pertinent test results  Airway Mallampati: II   Neck ROM: full    Dental   Pulmonary neg pulmonary ROS,    breath sounds clear to auscultation       Cardiovascular hypertension,  Rhythm:regular Rate:Normal     Neuro/Psych PSYCHIATRIC DISORDERS Anxiety Depression    GI/Hepatic GERD  ,  Endo/Other  Morbid obesity  Renal/GU      Musculoskeletal   Abdominal   Peds  Hematology   Anesthesia Other Findings   Reproductive/Obstetrics                             Anesthesia Physical Anesthesia Plan  ASA: II  Anesthesia Plan: General   Post-op Pain Management:    Induction: Intravenous  PONV Risk Score and Plan: 3 and Dexamethasone, Ondansetron, Midazolam and Treatment may vary due to age or medical condition  Airway Management Planned: Oral ETT  Additional Equipment:   Intra-op Plan:   Post-operative Plan: Extubation in OR  Informed Consent: I have reviewed the patients History and Physical, chart, labs and discussed the procedure including the risks, benefits and alternatives for the proposed anesthesia with the patient or authorized representative who has indicated his/her understanding and acceptance.       Plan Discussed with: CRNA, Anesthesiologist and Surgeon  Anesthesia Plan Comments:         Anesthesia Quick Evaluation

## 2019-05-29 NOTE — Anesthesia Procedure Notes (Signed)
Procedure Name: Intubation Date/Time: 05/29/2019 1:13 PM Performed by: Pilar Grammes, CRNA Pre-anesthesia Checklist: Patient identified, Emergency Drugs available, Suction available, Patient being monitored and Timeout performed Patient Re-evaluated:Patient Re-evaluated prior to induction Oxygen Delivery Method: Circle system utilized Preoxygenation: Pre-oxygenation with 100% oxygen Induction Type: IV induction Ventilation: Mask ventilation without difficulty Laryngoscope Size: Miller and 2 Grade View: Grade I Tube type: Oral Tube size: 7.5 mm Number of attempts: 1 Airway Equipment and Method: Stylet Placement Confirmation: positive ETCO2,  ETT inserted through vocal cords under direct vision,  CO2 detector and breath sounds checked- equal and bilateral Secured at: 21 cm Tube secured with: Tape Dental Injury: Teeth and Oropharynx as per pre-operative assessment

## 2019-05-29 NOTE — Transfer of Care (Signed)
Immediate Anesthesia Transfer of Care Note  Patient: Linda Blackburn  Procedure(s) Performed: ENDOSCOPIC RETROGRADE CHOLANGIOPANCREATOGRAPHY (ERCP) (N/A )  Patient Location: Endoscopy Unit  Anesthesia Type:General  Level of Consciousness: awake, alert , oriented and patient cooperative  Airway & Oxygen Therapy: Patient Spontanous Breathing and Patient connected to face mask oxygen  Post-op Assessment: Report given to RN, Post -op Vital signs reviewed and stable and Patient moving all extremities  Post vital signs: Reviewed and stable  Last Vitals:  Vitals Value Taken Time  BP 186/66 05/29/19 1440  Temp    Pulse 88 05/29/19 1440  Resp 18 05/29/19 1440  SpO2 97 % 05/29/19 1440    Last Pain:  Vitals:   05/29/19 1151  TempSrc: Oral  PainSc: 7       Patients Stated Pain Goal: 2 ([id number])  Complications: No apparent anesthesia complications

## 2019-05-29 NOTE — Progress Notes (Signed)
PROGRESS NOTE    Linda Blackburn  ZOX:[id number] DOB: 1951/03/11 DOA: 05/26/2019 PCP: [name], MD    Brief Narrative:  68 year old female who presented with abdominal pain, she does have significant past medical history for recurrent idiopathic pancreatitis.  She reported epigastric abdominal pain 10 out of 10 in intensity, associated with nausea and vomiting.  On her initial physical examination temperature was 98.2, blood pressure 92/50, pulse rate 78, respiratory 25, oxygen saturation 89%, mucous membranes, lungs clear to auscultation bilaterally, heart S1-S2 present rhythm, abdomen with diffuse epigastric right upper quadrant abdominal pain and tenderness, no lower extremity edema.  Sodium 139, potassium 3.4, chloride 105, bicarb 25, glucose 116, BUN 13, creatinine 0.94, alkaline phosphatase 243, lipase 29, AST 231, ALT 372, total bilirubin is 4.2. CT of the abdomen with mild haziness of the peripancreatic and upper abdominal fat concerning for acute pancreatitis.  No intrahepatic duct dilatation, status post cholecystectomy.  The patient was admitted to the hospital with a working diagnosis of recurrent pancreatitis, complicated by elevated liver enzymes, hyperbilirubinemia and hypotension.  Further work-up with MRCP showed bile duct dilatation with 6 x 9 mm distal common bile duct stone.  Mild pancreatitis.  No evidence of pseudocyst or pancreatic necrosis.  Hepatic steatosis.   Assessment & Plan:   Principal Problem:   Acute recurrent pancreatitis Active Problems:   Depression, recurrent (HCC)   Hypertension   Hypokalemia   Gastroesophageal reflux disease   1. Acute pancreatitis due to common bile duct stone. This am wbc at 6,0 with T bilirubins 5.0 > 2,8, Alk P 208> 199 AST 144>100 and ALT 296>233. Pain and nausea have improved with IV hydromorphone and promethazine. Continue IV fluids and IV antibiotic therapy with Zosyn. Planned for ERCP today per GI. Will continue to  follow with liver panel in am. Continue to be NPO for now.  2. HTN. Blood pressure 165 to 173 systolic, will continue blood pressure monitoring and holding on antihypertensive medications for now.  3. Hypokalemia. K this am up to 3,7, will continue close monitoring, renal function with serum cr at 0,96. Will continue IV fluids and follow with renal panel in am.  4. Depression. On sertraline.   5. Obesity. Her calculated BMI is 38,7.   DVT prophylaxis: enoxaparin   Code Status: full Family Communication: no family at the bedside  Disposition Plan/ discharge barriers: pending ERCP today    Body mass index is 38.71 kg/m. Malnutrition Type:      Malnutrition Characteristics:      Nutrition Interventions:     RN Pressure Injury Documentation:     Consultants:   GI   Procedures:   ERCP   Antimicrobials:   Zosyn     Subjective: Patient continue to have abdominal pain and nausea improved with analgesics and antiemetics. No dyspnea and no chest pain. Positive generalized pruritus.   Objective: Vitals:   05/28/19 0642 05/28/19 1432 05/28/19 2037 05/29/19 0607  BP: (!) 124/57 126/67 (!) 126/54 100/82  Pulse: (!) 53 (!) 51 (!) 55 71  Resp: 17 17 18 16   Temp: 98.4 F (36.9 C) 98.1 F (36.7 C) 98.1 F (36.7 C) 98.4 F (36.9 C)  TempSrc: Oral Oral Oral Oral  SpO2: 95% 93% 95% (!) 82%  Weight:      Height:        Intake/Output Summary (Last 24 hours) at 05/29/2019 1117 Last data filed at 05/29/2019 0930 Gross per 24 hour  Intake 1276.46 ml  Output 750 ml  Net 526.46 ml   Filed Weights   05/26/19 2044 05/27/19 0842  Weight: 90.7 kg 96 kg    Examination:   General: Not in pain or dyspnea, deconditioned  Neurology: Awake and alert, non focal  E ENT: mild pallor and  icterus, oral mucosa moist Cardiovascular: No JVD. S1-S2 present, rhythmic, no gallops, rubs, or murmurs. No lower extremity edema. Pulmonary: positive breath sounds bilaterally,  adequate air movement, no wheezing, rhonchi or rales. Gastrointestinal. Abdomen with no organomegaly, positive tender right upper quadrant and epigastrium with no rebound or guarding Skin. No rashes Musculoskeletal: no joint deformities     Data Reviewed: I have personally reviewed following labs and imaging studies  CBC: Recent Labs  Lab 05/26/19 2138 05/27/19 0615 05/28/19 0518 05/29/19 0544  WBC 7.4 11.1* 5.8 6.0  NEUTROABS  --   --   --  3.0  HGB 14.7 13.2 12.6 12.5  HCT 46.1* 40.3 40.7 41.2  MCV 93.1 91.6 94.0 96.7  PLT 233 221 211 230   Basic Metabolic Panel: Recent Labs  Lab 05/26/19 2138 05/27/19 0615 05/28/19 0518 05/29/19 0544  NA 139 137 140 139  K 3.4* 3.8 3.4* 3.7  CL 105 107 108 106  CO2 24 21* 24 24  GLUCOSE 116* 119* 121* 92  BUN 13 12 9 10   CREATININE 0.94 0.76  0.80 0.83 0.96  CALCIUM 9.3 8.6* 8.7* 8.7*   GFR: Estimated Creatinine Clearance: 59 mL/min (by C-G formula based on SCr of 0.96 mg/dL). Liver Function Tests: Recent Labs  Lab 05/26/19 2138 05/27/19 0615 05/28/19 0518 05/29/19 0544  AST 231* 222* 144* 100*  ALT 372* 354* 296* 233*  ALKPHOS 243* 219* 208* 199*  BILITOT 4.2* 5.4* 5.0* 2.8*  PROT 7.6 7.0 6.8 6.6  ALBUMIN 4.1 3.4* 3.5 3.4*   Recent Labs  Lab 05/26/19 2138  LIPASE 29   No results for input(s): AMMONIA in the last 168 hours. Coagulation Profile: No results for input(s): INR, PROTIME in the last 168 hours. Cardiac Enzymes: No results for input(s): CKTOTAL, CKMB, CKMBINDEX, TROPONINI in the last 168 hours. BNP (last 3 results) No results for input(s): PROBNP in the last 8760 hours. HbA1C: No results for input(s): HGBA1C in the last 72 hours. CBG: No results for input(s): GLUCAP in the last 168 hours. Lipid Profile: No results for input(s): CHOL, HDL, LDLCALC, TRIG, CHOLHDL, LDLDIRECT in the last 72 hours. Thyroid Function Tests: No results for input(s): TSH, T4TOTAL, FREET4, T3FREE, THYROIDAB in the last 72  hours. Anemia Panel: No results for input(s): VITAMINB12, FOLATE, FERRITIN, TIBC, IRON, RETICCTPCT in the last 72 hours.    Radiology Studies: I have reviewed all of the imaging during this hospital visit personally     Scheduled Meds: . pantoprazole (PROTONIX) IV  40 mg Intravenous Daily  . sertraline  100 mg Oral Daily   Continuous Infusions: . dextrose 5% lactated ringers 75 mL/hr at 05/29/19 0441  . piperacillin-tazobactam (ZOSYN)  IV 3.375 g (05/29/19 0934)     LOS: 2 days        Linda Blackburn Annett Gula, MD

## 2019-05-29 NOTE — Interval H&P Note (Signed)
History and Physical Interval Note:  05/29/2019 11:58 AM  Linda Blackburn  has presented today for surgery, with the diagnosis of Choledocholithiasis, Elevated LFT.  The various methods of treatment have been discussed with the patient and family. After consideration of risks, benefits and other options for treatment, the patient has consented to  Procedure(s): ENDOSCOPIC RETROGRADE CHOLANGIOPANCREATOGRAPHY (ERCP) (N/A) as a surgical intervention.  The patient's history has been reviewed, patient examined, no change in status, stable for surgery.  I have reviewed the patient's chart and labs.  Questions were answered to the patient's satisfaction.     The risks of an ERCP were discussed at length, including but not limited to the risk of perforation, bleeding, abdominal pain, post-ERCP pancreatitis (while usually mild can be severe and even life threatening).   [employer]

## 2019-05-29 NOTE — Op Note (Signed)
[facility] Patient Name: Linda Blackburn Procedure Date: 05/29/2019 MRN: [id number] Attending MD: Justice Britain , MD Date of Birth: 1950-12-04 CSN: [id number] Age: 68 Admit Type: Inpatient Procedure:                ERCP Indications:              Bile duct stone(s), Abnormal MRCP, Jaundice,                            Abnormal liver function test Providers:                Justice Britain, MD, Jeanella Cara, RN,                            Elspeth Cho Tech., Technician, Sheria Lang,                            CRNA Referring MD:             Estill Cotta. Loletha Carrow, MD, Ellouise Newer PA, [facility]                            Hospitalist Medicines:                General Anesthesia, Zosyn IV completed prior to                            procedure, Indomethacin 100 mg PR, Glucagon 0.5 mg                            IV Complications:            No immediate complications. Estimated Blood Loss:     Estimated blood loss was minimal. Procedure:                Pre-Anesthesia Assessment:                           - Prior to the procedure, a History and Physical                            was performed, and patient medications and                            allergies were reviewed. The patient's tolerance of                            previous anesthesia was also reviewed. The risks                            and benefits of the procedure and the sedation                            options and risks were discussed with the patient.                            All questions were answered, and  informed consent                            was obtained. Prior Anticoagulants: The patient has                            taken no previous anticoagulant or antiplatelet                            agents. ASA Grade Assessment: III - A patient with                            severe systemic disease. After reviewing the risks                            and benefits, the patient was deemed in                             satisfactory condition to undergo the procedure.                           After obtaining informed consent, the scope was                            passed under direct vision. Throughout the                            procedure, the patient's blood pressure, pulse, and                            oxygen saturations were monitored continuously. The                            GIF-H190 ([id number]) Olympus gastroscope was                            introduced through the mouth, and used to inject                            contrast into and used to locate the major papilla.                            The TJF-Q180V ([id number]) Olympus duodenoscope was                            introduced through the mouth, and used to inject                            contrast into and used to inject contrast into the                            bile duct. The ERCP was accomplished without  difficulty. The patient tolerated the procedure. Scope In: Scope Out: Findings:      A scout film of the abdomen was obtained. Surgical clips, consistent       with a previous cholecystectomy, were seen in the area of the right       upper quadrant of the abdomen.      The esophagus was successfully intubated under direct vision without       detailed examination of the pharynx, larynx, and associated structures.       A standard esophagogastroduodenoscopy scope was used for the examination       of the upper gastrointestinal tract. The scope was passed under direct       vision through the upper GI tract. No gross lesions were noted in the       entire esophagus. The Z-line was regular and was found 40 cm from the       incisors. Patchy mildly erythematous mucosa without bleeding was found       in the gastric body. This was biopsied for HP evaluation. A single 14 mm       mucosal nodule was found in the duodenal bulb entering through the       pylorus suggestive of a  Bruenner gland hyperplasia. This was biopsied. A       single 8 mm semi-sessile polyp with no bleeding was found in the first       portion of the duodenum. The major papilla was located entirely within a       diverticulum. A biliary sphincterotomy had been performed. The       sphincterotomy appeared open. A minor papilla precut sphincterotomy had       been performed, not clear that it truly involved the minor itself or if       there has been fistulization.      In the short position, a short 0.035 inch Soft Jagwire was passed into       the biliary tree. The short-nosed traction sphincterotome was passed       over the guidewire and the bile duct was then deeply cannulated.       Contrast was injected. I personally interpreted the bile duct images.       Ductal flow of contrast was adequate. Image quality was adequate.       Contrast extended to the hepatic ducts. Opacification of the gallbladder       was successful. The main bile duct was moderately dilated. The largest       diameter was 11 mm. The lower third of the main bile duct contained       filling defect thought to be a stone. The lower third of the main bile       duct was successfully dilated with an 04-12-09 mm balloon (to a maximum       balloon size of 9 mm) dilator as a sphincteroplasty for 4 minutes. To       discover objects, the biliary tree was swept with a retrieval balloon.       One stone was removed. No stones remained. An occlusion cholangiogram       was performed that showed no further significant biliary pathology.      A pancreatogram was not performed.      The duodenoscope was withdrawn from the patient.      The endoscope was replaced into the patient. The polyp in D1 was removed  with a cold snare. The polypectomy was performed through the       esophagogastroduodenoscope. Resection and retrieval were complete. To       prevent bleeding after the polypectomy in the first portion of the        duodenum, one hemostatic clip was successfully placed (MR conditional).       There was no bleeding at the end of the procedure.      The endoscope was removed. Impression:               - No gross lesions in esophagus. Z-line regular, 40                            cm from the incisors.                           - Erythematous mucosa in the gastric body. Biopsied                            for HP.                           - Mucosal nodule found in the duodenum - likely                            Bruenner's gland biopsied.                           - A single duodenal polyp. Polypectomy performed.                            Resected and retrieved. Clip was placed.                           - The major papilla was located entirely within a                            diverticulum.                           - Prior biliary sphincterotomy appeared open.                           - Prior minor papilla sphincterotomy/ precut was                            noted.                           - A filling defect consistent with a stone was seen                            on the cholangiogram.                           - The entire main bile duct was moderately dilated.                           -  Sphincteroplasty performed.                           - Choledocholithiasis was found. Complete removal                            was accomplished by sphincteroplasty and balloon                            sweeping. Moderate Sedation:      Not Applicable - Patient had care per Anesthesia. Recommendation:           - The patient will be observed post-procedure,                            until all discharge criteria are met.                           - Return patient to hospital ward for ongoing care.                           - Watch for pancreatitis, bleeding, perforation,                            and cholangitis.                           - Check liver enzymes (AST, ALT, alkaline                             phosphatase, bilirubin) in the morning.                           - Observe patient's clinical course.                           - Await path results.                           - Follow up with Dr. Ardis Hughs as necessary.                           - Hold heparin VTE PPx for 48 hours.                           - The findings and recommendations were discussed                            with the patient.                           - The findings and recommendations were discussed                            with the referring physician. Procedure Code(s):        --- Professional ---  [phone], Endoscopic retrograde                            cholangiopancreatography (ERCP); with removal of                            calculi/debris from biliary/pancreatic duct(s)                           43251, Esophagogastroduodenoscopy, flexible,                            transoral; with removal of tumor(s), polyp(s), or                            other lesion(s) by snare technique Diagnosis Code(s):        --- Professional ---                           K31.89, Other diseases of stomach and duodenum                           K31.7, Polyp of stomach and duodenum                           R93.2, Abnormal findings on diagnostic imaging of                            liver and biliary tract                           K80.50, Calculus of bile duct without cholangitis                            or cholecystitis without obstruction                           R17, Unspecified jaundice                           R94.5, Abnormal results of liver function studies                           K83.8, Other specified diseases of biliary tract CPT copyright 2019 American Medical Association. All rights reserved. The codes documented in this report are preliminary and upon coder review may  be revised to meet current compliance requirements. Justice Britain, MD 05/29/2019 3:05:20 PM Number of  Addenda: 0

## 2019-05-30 ENCOUNTER — Encounter (HOSPITAL_COMMUNITY): Payer: Self-pay | Admitting: Gastroenterology

## 2019-05-30 ENCOUNTER — Other Ambulatory Visit: Payer: Self-pay | Admitting: Internal Medicine

## 2019-05-30 LAB — CBC WITH DIFFERENTIAL/PLATELET
Abs Immature Granulocytes: 0.07 10*3/uL (ref 0.00–0.07)
Basophils Absolute: 0 10*3/uL (ref 0.0–0.1)
Basophils Relative: 1 %
Eosinophils Absolute: 0 10*3/uL (ref 0.0–0.5)
Eosinophils Relative: 0 %
HCT: 40.9 % (ref 36.0–46.0)
Hemoglobin: 12.6 g/dL (ref 12.0–15.0)
Immature Granulocytes: 1 %
Lymphocytes Relative: 23 %
Lymphs Abs: 1.9 10*3/uL (ref 0.7–4.0)
MCH: 29.6 pg (ref 26.0–34.0)
MCHC: 30.8 g/dL (ref 30.0–36.0)
MCV: 96 fL (ref 80.0–100.0)
Monocytes Absolute: 0.6 10*3/uL (ref 0.1–1.0)
Monocytes Relative: 7 %
Neutro Abs: 5.7 10*3/uL (ref 1.7–7.7)
Neutrophils Relative %: 68 %
Platelets: 252 10*3/uL (ref 150–400)
RBC: 4.26 MIL/uL (ref 3.87–5.11)
RDW: 14.3 % (ref 11.5–15.5)
WBC: 8.3 10*3/uL (ref 4.0–10.5)
nRBC: 0 % (ref 0.0–0.2)

## 2019-05-30 LAB — BASIC METABOLIC PANEL
Anion gap: 9 (ref 5–15)
BUN: 13 mg/dL (ref 8–23)
CO2: 24 mmol/L (ref 22–32)
Calcium: 8.7 mg/dL — ABNORMAL LOW (ref 8.9–10.3)
Chloride: 106 mmol/L (ref 98–111)
Creatinine, Ser: 0.91 mg/dL (ref 0.44–1.00)
GFR calc Af Amer: >60 mL/min (ref >60)
GFR calc non Af Amer: >60 mL/min (ref >60)
Glucose, Bld: 108 mg/dL — ABNORMAL HIGH (ref 70–99)
Potassium: 3.8 mmol/L (ref 3.5–5.1)
Sodium: 139 mmol/L (ref 135–145)

## 2019-05-30 LAB — HEPATIC FUNCTION PANEL
ALT: 199 U/L — ABNORMAL HIGH (ref 0–44)
AST: 85 U/L — ABNORMAL HIGH (ref 15–41)
Albumin: 3.3 g/dL — ABNORMAL LOW (ref 3.5–5.0)
Alkaline Phosphatase: 197 U/L — ABNORMAL HIGH (ref 38–126)
Bilirubin, Direct: 0.8 mg/dL — ABNORMAL HIGH (ref 0.0–0.2)
Indirect Bilirubin: 1.1 mg/dL — ABNORMAL HIGH (ref 0.3–0.9)
Total Bilirubin: 1.9 mg/dL — ABNORMAL HIGH (ref 0.3–1.2)
Total Protein: 6.6 g/dL (ref 6.5–8.1)

## 2019-05-30 LAB — SURGICAL PATHOLOGY

## 2019-05-30 MED ORDER — OXYBUTYNIN CHLORIDE ER 5 MG PO TB24
10.0000 mg | ORAL_TABLET | Freq: Every day | ORAL | Status: DC
  Administered 2019-05-30 – 2019-05-31 (×2): 10 mg via ORAL
  Filled 2019-05-30 (×2): qty 2

## 2019-05-30 NOTE — Anesthesia Postprocedure Evaluation (Signed)
Anesthesia Post Note  Patient: Linda Blackburn  Procedure(s) Performed: ENDOSCOPIC RETROGRADE CHOLANGIOPANCREATOGRAPHY (ERCP) (N/A ) BILIARY DILATION REMOVAL OF STONES BIOPSY POLYPECTOMY ESOPHAGOGASTRODUODENOSCOPY (EGD) WITH PROPOFOL (N/A )     Patient location during evaluation: PACU Anesthesia Type: General Level of consciousness: awake and alert Pain management: pain level controlled Vital Signs Assessment: post-procedure vital signs reviewed and stable Respiratory status: spontaneous breathing, nonlabored ventilation, respiratory function stable and patient connected to nasal cannula oxygen Cardiovascular status: blood pressure returned to baseline and stable Postop Assessment: no apparent nausea or vomiting Anesthetic complications: no    Last Vitals:  Vitals:   05/30/19 1351 05/30/19 2015  BP: 138/71 (!) 174/76  Pulse: (!) 52 61  Resp: 20 18  Temp: 36.7 C 36.9 C  SpO2: 95% 97%    Last Pain:  Vitals:   05/30/19 2015  TempSrc: Oral  PainSc:                  [place]

## 2019-05-30 NOTE — Progress Notes (Signed)
PROGRESS NOTE    Linda Blackburn  WGN:[id number] DOB: 09/04/1951 DOA: 05/26/2019 PCP: [name], MD    Brief Narrative:  68 year old female who presented with abdominal pain, she does have significant past medical history for recurrent idiopathic pancreatitis. She reported epigastric abdominal pain 10 out of 10 in intensity, associated with nausea and vomiting. On her initial physical examination temperature was 98.2, blood pressure 92/50, pulse rate 78, respiratory 25, oxygen saturation 89%, mucous membranes, lungs clear to auscultation bilaterally, heart S1-S2 present rhythm, abdomen with diffuse epigastric right upper quadrant abdominal pain and tenderness, no lower extremity edema.  Sodium 139, potassium 3.4, chloride 105, bicarb 25, glucose 116, BUN 13, creatinine 0.94, alkaline phosphatase 243, lipase 29, AST 231, ALT 372, total bilirubin is 4.2.CT of the abdomen with mild haziness of the peripancreatic and upper abdominal fat concerning for acute pancreatitis. No intrahepatic duct dilatation, status post cholecystectomy.  The patient was admitted to the hospital with a working diagnosis of recurrent pancreatitis,complicated byelevatedliver enzymes, hyperbilirubinemia and hypotension.  Further work-up with MRCP showed bile duct dilatation with 6 x 9 mm distal common bile duct stone. Mild pancreatitis. No evidence of pseudocyst or pancreatic necrosis. Hepatic steatosis.    Assessment & Plan:   Principal Problem:   Acute recurrent pancreatitis Active Problems:   Depression, recurrent (HCC)   Hypertension   Hypokalemia   Gastroesophageal reflux disease   1. Acute pancreatitis due to common bile duct stone. Patient is sp ERCP, with sphincterectomy and stone removal. Today with improved symptoms but not yet back to baseline, no nausea or vomiting. Will discontinue IV and IV antibiotic therapy, continue to advance diet as tolerated. Continue pain control with  hydromorphone and hydrocodone.   2. HTN. Blood pressure 138 systolic, will continue to hold on antihypertensive medications.   3. Hypokalemia. K corrected at 3,8 with serum bicarbonate at 24 and preserved renal function with serum cr at 0,91. Hold on IV fluids and continue to advance diet as tolerated.  4. Depression. Continue with sertraline.   5. Obesity. Her calculated BMI is 38,7.  DVT prophylaxis:enoxaparin Code Status:full Family Communication:no family at the bedside Disposition Plan/ discharge barriers:plan for dc in am if continue clinical improvement.      Body mass index is 38.71 kg/m. Malnutrition Type:      Malnutrition Characteristics:      Nutrition Interventions:     RN Pressure Injury Documentation:     Consultants:   GI   Procedures:   ERCP   Antimicrobials:   Zosyn dc 09.25    Subjective: Her abdominal pain has improved but not back to baseline, no nausea or vomiting, no diarrhea. Pruritus has improved.   Objective: Vitals:   05/29/19 1520 05/29/19 1535 05/29/19 1959 05/30/19 0517  BP: (!) 184/74 (!) 170/79 (!) 153/71 118/63  Pulse: 63 68 (!) 57 (!) 51  Resp: 13 18 16 20   Temp:  98.4 F (36.9 C) 98.5 F (36.9 C) 98 F (36.7 C)  TempSrc:  Oral Oral Oral  SpO2: 96% 91% 93% 95%  Weight:      Height:        Intake/Output Summary (Last 24 hours) at 05/30/2019 1136 Last data filed at 05/30/2019 0800 Gross per 24 hour  Intake 1254.02 ml  Output 1200 ml  Net 54.02 ml   Filed Weights   05/26/19 2044 05/27/19 0842  Weight: 90.7 kg 96 kg    Examination:   General: Not in pain or dyspnea, deconditioned  Neurology: Awake  and alert, non focal  E ENT: mild pallor, no icterus, oral mucosa moist Cardiovascular: No JVD. S1-S2 present, rhythmic, no gallops, rubs, or murmurs. No lower extremity edema. Pulmonary: positive breath sounds bilaterally, adequate air movement, no wheezing, rhonchi or rales. Gastrointestinal.  Abdomen with no organomegaly, mild tender to deep palpation, with no rebound or guarding Skin. No rashes Musculoskeletal: no joint deformities     Data Reviewed: I have personally reviewed following labs and imaging studies  CBC: Recent Labs  Lab 05/26/19 2138 05/27/19 0615 05/28/19 0518 05/29/19 0544 05/30/19 0537  WBC 7.4 11.1* 5.8 6.0 8.3  NEUTROABS  --   --   --  3.0 5.7  HGB 14.7 13.2 12.6 12.5 12.6  HCT 46.1* 40.3 40.7 41.2 40.9  MCV 93.1 91.6 94.0 96.7 96.0  PLT 233 221 211 230 252   Basic Metabolic Panel: Recent Labs  Lab 05/26/19 2138 05/27/19 0615 05/28/19 0518 05/29/19 0544 05/30/19 0537  NA 139 137 140 139 139  K 3.4* 3.8 3.4* 3.7 3.8  CL 105 107 108 106 106  CO2 24 21* 24 24 24   GLUCOSE 116* 119* 121* 92 108*  BUN 13 12 9 10 13   CREATININE 0.94 0.76  0.80 0.83 0.96 0.91  CALCIUM 9.3 8.6* 8.7* 8.7* 8.7*   GFR: Estimated Creatinine Clearance: 62.2 mL/min (by C-G formula based on SCr of 0.91 mg/dL). Liver Function Tests: Recent Labs  Lab 05/26/19 2138 05/27/19 0615 05/28/19 0518 05/29/19 0544 05/30/19 0537  AST 231* 222* 144* 100* 85*  ALT 372* 354* 296* 233* 199*  ALKPHOS 243* 219* 208* 199* 197*  BILITOT 4.2* 5.4* 5.0* 2.8* 1.9*  PROT 7.6 7.0 6.8 6.6 6.6  ALBUMIN 4.1 3.4* 3.5 3.4* 3.3*   Recent Labs  Lab 05/26/19 2138  LIPASE 29   No results for input(s): AMMONIA in the last 168 hours. Coagulation Profile: No results for input(s): INR, PROTIME in the last 168 hours. Cardiac Enzymes: No results for input(s): CKTOTAL, CKMB, CKMBINDEX, TROPONINI in the last 168 hours. BNP (last 3 results) No results for input(s): PROBNP in the last 8760 hours. HbA1C: No results for input(s): HGBA1C in the last 72 hours. CBG: No results for input(s): GLUCAP in the last 168 hours. Lipid Profile: No results for input(s): CHOL, HDL, LDLCALC, TRIG, CHOLHDL, LDLDIRECT in the last 72 hours. Thyroid Function Tests: No results for input(s): TSH, T4TOTAL,  FREET4, T3FREE, THYROIDAB in the last 72 hours. Anemia Panel: No results for input(s): VITAMINB12, FOLATE, FERRITIN, TIBC, IRON, RETICCTPCT in the last 72 hours.    Radiology Studies: I have reviewed all of the imaging during this hospital visit personally     Scheduled Meds: . oxybutynin  10 mg Oral QPC breakfast  . sertraline  100 mg Oral Daily   Continuous Infusions:   LOS: 3 days        Ehsan Corvin Annett Gula, MD

## 2019-05-30 NOTE — Progress Notes (Addendum)
Progress Note   Subjective  Chief Complaint: Recurrent pancreatitis  ERCP 05/29/19 with removal of stones. Please see full report for details.  Patient feeling well this morning after a shower. Does continue with "soreness" across her epigastrum, like "someone was in there", but this has not really changed since admission. Patient tells me she would like to wait until tomorrow to be discharged, thinks she needs one more day. Had a heart healthy diet for dinner and breakfast this morning, no nausea or vomiting.  Review of systems: Denies chest pain dyspnea or dysuria  Objective   Vital signs in last 24 hours: Temp:  [97.7 F (36.5 C)-98.5 F (36.9 C)] 98 F (36.7 C) (09/25 0517) Pulse Rate:  [51-88] 51 (09/25 0517) Resp:  [12-20] 20 (09/25 0517) BP: (118-201)/(62-139) 118/63 (09/25 0517) SpO2:  [91 %-97 %] 95 % (09/25 0517) Last BM Date: 05/29/19 General:    white female in NAD Heart:  Regular rate and rhythm; no murmurs Lungs: Respirations even and unlabored, lungs CTA bilaterally Abdomen:  Soft, mild epigastric ttp and nondistended. Normal bowel sounds. Extremities:  Without edema. Neurologic:  Alert and oriented,  grossly normal neurologically. Psych:  Cooperative. Normal mood and affect.  Intake/Output from previous day: 09/24 0701 - 09/25 0700 In: 1014 [I.V.:900; IV Piggyback:114] Out: 1750 [Urine:1750] Intake/Output this shift: Total I/O In: 240 [P.O.:240] Out: -   Lab Results: Recent Labs    05/28/19 0518 05/29/19 0544 05/30/19 0537  WBC 5.8 6.0 8.3  HGB 12.6 12.5 12.6  HCT 40.7 41.2 40.9  PLT 211 230 252   BMET Recent Labs    05/28/19 0518 05/29/19 0544 05/30/19 0537  NA 140 139 139  K 3.4* 3.7 3.8  CL 108 106 106  CO2 24 24 24   GLUCOSE 121* 92 108*  BUN 9 10 13   CREATININE 0.83 0.96 0.91  CALCIUM 8.7* 8.7* 8.7*   LFT Recent Labs    05/30/19 0537  PROT 6.6  ALBUMIN 3.3*  AST 85*  ALT 199*  ALKPHOS 197*  BILITOT 1.9*  BILIDIR 0.8*   IBILI 1.1*   Studies/Results: Dg Ercp  Result Date: 05/29/2019 CLINICAL DATA:  68 year old female undergoing ERCP for choledocholithiasis EXAM: ERCP TECHNIQUE: Multiple spot images obtained with the fluoroscopic device and submitted for interpretation post-procedure. FLUOROSCOPY TIME:  Fluoroscopy Time:  2 minutes 25 seconds Radiation Exposure Index (if provided by the fluoroscopic device): 24.9 mGy COMPARISON:  MRCP 05/27/2019 FINDINGS: Eleven saved intraoperative images are submitted for review. The images demonstrate a flexible endoscope in the descending duodenum with wire cannulation of the intrahepatic ducts. Subsequent cholangiogram demonstrates multiple filling defects in the distal common bile duct concerning for choledocholithiasis. The images then go on to document sphincterotomy and balloon sweeping of the common duct. IMPRESSION: 1. Choledocholithiasis. 2. ERCP with sphincterotomy and balloon sweep of the common duct. These images were submitted for radiologic interpretation only. Please see the procedural report for the amount of contrast and the fluoroscopy time utilized. Electronically Signed   By: Jacqulynn Cadet M.D.   On: 05/29/2019 14:52   ERCP findings were discussed with Dr. Rush Landmark yesterday.   Assessment / Plan:   Assessment: 1. Acute recurrent pancreatitis: ERCP 05/29/19 with removal of stones and  Sphincteroplasty, LFT's trending down today, patient feeling better 2.  Choledocholithiasis, which was causing the majority of patient's symptoms. 3: Obstructive jaundice from choledocholithiasis  Plan: 1.  CDs for DVT prophylaxis, do not use Lovenox 2. [place] with patient discharge from GI  standpoint, she would like to wait until tomorrow 3. Can continue heart healthy diet as long as she is tolerating, did discuss that she could back off if this is increasing discomfort 4. Please await any final recommendations from Dr. Loletha Carrow later today  Thank you for your kind  consultation, we will sign off.    LOS: 3 days   Levin Erp  05/30/2019, 10:39 AM  I have discussed the case with the PA, and that is the plan I formulated. I personally interviewed and examined the patient.  She is still having upper abdominal pain, mild tenderness on exam.  She feels about the same as before the procedure. She will continue solid food, encouraged her to ambulate, we stopped IV fluids and antibiotics after discussion with [facility] hospitalist.  When she is ready to go home, she should follow-up with primary care in about 2 weeks for repeat check of LFTs. Return to see her primary GI Dr. Oretha Caprice as needed.  Total time 25 minutes   Nelida Meuse III Office: [phone]

## 2019-05-30 NOTE — Care Management Important Message (Signed)
Important Message  Patient Details IM Letter given to Marney Doctor RN to present to the Patient Name: Linda Blackburn MRN: [id number] Date of Birth: [birth year]   Medicare Important Message Given:  Yes     Kerin Salen 05/30/2019, 10:07 AM

## 2019-05-31 NOTE — Discharge Summary (Addendum)
Physician Discharge Summary  Linda Blackburn [id number] DOB: 1950-10-23 DOA: 05/26/2019  PCP: [name], MD  Admit date: 05/26/2019 Discharge date: 05/31/2019  Admitted From: Home  Disposition:  Home   Recommendations for Outpatient Follow-up and new medication changes:  1. Follow up with Dr. Elease Hashimoto in 7 days. 2. Please follow up with liver function test in 2 weeks.  3.  Follow with primary GI Dr. Oretha Caprice as needed.    Home Health: no   Equipment/Devices: no   Discharge Condition: stable  CODE STATUS: DNR  Diet recommendation: heart healthy    Brief/Interim Summary: 68 year old female who presented with abdominal Blackburn, Linda does have significant past medical history for recurrent idiopathic pancreatitis. Linda reported epigastric abdominal Blackburn 10 out of 10 in intensity, associated with nausea and vomiting. On her initial physical examination temperature was 98.2, blood pressure 92/50, pulse rate 78, respiratory rate 25, oxygen saturation 89%, mucous membranes, lungs clear to auscultation bilaterally, heart S1-S2 present rhythm, abdomen with diffuse epigastric right upper quadrant abdominal Blackburn and tenderness, no lower extremity edema.  Sodium 139, potassium 3.4, chloride 105, bicarb 25, glucose 116, BUN 13, creatinine 0.94, alkaline phosphatase 243, lipase 29, AST 231, ALT 372, total bilirubin is 4.2.CT of the abdomen with mild haziness of the peripancreatic and upper abdominal fat concerning for acute pancreatitis. No intrahepatic duct dilatation, status post cholecystectomy.  The patient was admitted to the hospital with a working diagnosis of recurrent pancreatitis,complicated byelevatedliver enzymes, hyperbilirubinemia and hypotension.  Further work-up with MRCP showed bile duct dilatation with 6 x 9 mm distal common bile duct stone. Mild pancreatitis. No evidence of pseudocyst or pancreatic necrosis. Hepatic steatosis.  Patient underwent ERCP and stone was  successfully removed.    1. Acute pancreatitis due to common bile duct stone. Patient was admitted to the medical ward, Linda was kept nothing by mouth, received intravenous fluids, IV antibiotics with Zosyn, as needed IV analgesics and antiemetics.  Linda underwent further work-up with MRCP which showed bile duct dilatation with 6 x 9 mm distal common bile duct stone.   Patient underwent ERCP, findings with mucosal nodule  duodenum, likely Brunner's gland biopsied, single duodenal polyp, polypectomy performed,  choledocholithiasis was found and complete removal was accomplished by sphincteroplasty and balloon sweeping.   Postprocedure her diet was advanced progressively good toleration, her symptoms remarkably improved.  Patient will follow-up as an outpatient.  2. HTN. During her hospitalization antihypertensive agents were held, at discharge Linda will resume losartan.  3. Hypokalemia. Potassium was corrected potassium chloride with no major complications.  4. Depression.Continue with sertraline.   5. Obesity.Her calculated BMI is 38,7.  6.GERD.  Continue pantoprazole.  Discharge Diagnoses:  Principal Problem:   Acute recurrent pancreatitis Active Problems:   Depression, recurrent (HCC)   Hypertension   Hypokalemia   Gastroesophageal reflux disease    Discharge Instructions   Allergies as of 05/31/2019      Reactions   Other Hives, Swelling   AQUASONIC [place] GEL   Buprenorphine Hcl    "In a scarry movie" weird thoughts   Codeine    GI upset   Dilaudid [hydromorphone Hcl] Itching   Morphine And Related    "In a scarry movie" weird thoughts      Medication List    TAKE these medications   HYDROcodone-acetaminophen 5-325 MG tablet Commonly known as: NORCO/VICODIN Take 1 tablet by mouth every 6 (six) hours as needed for moderate Blackburn.   losartan 100 MG tablet Commonly known as:  COZAAR Take 1 tablet (100 mg total) by mouth daily.   naproxen sodium 220 MG  tablet Commonly known as: ALEVE Take 440 mg by mouth daily as needed (Blackburn).   ondansetron 4 MG disintegrating tablet Commonly known as: ZOFRAN-ODT Take 1 tablet (4 mg total) by mouth every 8 (eight) hours as needed. What changed: reasons to take this   oxybutynin 10 MG 24 hr tablet Commonly known as: DITROPAN-XL Take 10 mg by mouth daily after breakfast.   pantoprazole 40 MG tablet Commonly known as: PROTONIX Take 1 tablet (40 mg total) by mouth daily at 6 (six) AM.   sertraline 100 MG tablet Commonly known as: Zoloft Take 1 tablet (100 mg total) by mouth daily.   zolpidem 5 MG tablet Commonly known as: AMBIEN Take 1 tablet (5 mg total) by mouth at bedtime as needed for sleep.       Allergies  Allergen Reactions  . Other Hives and Swelling    AQUASONIC [place] GEL  . Buprenorphine Hcl     "In a scarry movie" weird thoughts  . Codeine     GI upset  . Dilaudid [Hydromorphone Hcl] Itching  . Morphine And Related     "In a scarry movie" weird thoughts    Consultations:  GI    Procedures/Studies: Ct Abdomen Pelvis W Contrast  Result Date: 05/27/2019 CLINICAL DATA:  67 year old female with generalized abdominal Blackburn x5 days. EXAM: CT ABDOMEN AND PELVIS WITH CONTRAST TECHNIQUE: Multidetector CT imaging of the abdomen and pelvis was performed using the standard protocol following bolus administration of intravenous contrast. CONTRAST:  122mL OMNIPAQUE IOHEXOL 300 MG/ML  SOLN COMPARISON:  CT dated 08/08/2018. FINDINGS: Lower chest: The visualized lung bases are clear. No intra-abdominal free air or free fluid. Hepatobiliary: Apparent mild fatty infiltration of the liver. No intrahepatic biliary ductal dilatation. Cholecystectomy. No retained calcified stone noted in the central CBD Pancreas: Mild haziness of the peripancreatic and upper abdominal fat concerning for acute pancreatitis. Correlation with pancreatic enzymes recommended. No drainable fluid collection/abscess or  pseudocyst. Spleen: Normal in size without focal abnormality. Adrenals/Urinary Tract: The adrenal glands are unremarkable. There is no hydronephrosis on either side. There is symmetric enhancement and excretion of contrast by both kidneys. The visualized ureters and urinary bladder appear unremarkable. Stomach/Bowel: There is sigmoid diverticulosis without active inflammatory changes. There is a 2 cm duodenal diverticulum. There is no bowel obstruction or active inflammation. The appendix is not visualized with certainty. No inflammatory changes identified in the right lower quadrant. Vascular/Lymphatic: Mild atherosclerotic calcification of the aorta. The IVC is unremarkable. No portal venous gas. There is no adenopathy. Reproductive: Hysterectomy. No pelvic mass. Other: Small fat containing umbilical hernia. Musculoskeletal: No acute or significant osseous findings. IMPRESSION: 1. Findings concerning for acute pancreatitis. Correlation with pancreatic enzymes recommended. No drainable fluid collection/abscess or pseudocyst. 2. Sigmoid diverticulosis. No bowel obstruction or active inflammation. Aortic Atherosclerosis (ICD10-I70.0). Electronically Signed   By: Anner Crete M.D.   On: 05/27/2019 01:39   Mr 3d Recon At Scanner  Result Date: 05/27/2019 CLINICAL DATA:  Jaundice, abdominal Blackburn, fevers. EXAM: MRI ABDOMEN WITHOUT AND WITH CONTRAST (INCLUDING MRCP) TECHNIQUE: Multiplanar multisequence MR imaging of the abdomen was performed both before and after the administration of intravenous contrast. Heavily T2-weighted images of the biliary and pancreatic ducts were obtained, and three-dimensional MRCP images were rendered by post processing. CONTRAST:  64mL GADAVIST GADOBUTROL 1 MMOL/ML IV SOLN COMPARISON:  05/27/2019 FINDINGS: Lower chest: No acute findings. Hepatobiliary: Mild hepatic steatosis.  Hypertrophy of the lateral segment of left lobe of liver and caudate lobe of liver identified. Heterogeneous  early arterial phase enhancement with uniform enhancement on the portal venous and delayed phase images. No focal liver abnormality. Previous cholecystectomy. Mild intrahepatic bile duct dilatation. The common bile duct is increased in caliber measuring up to 9 mm, image 73/5. Stone within the distal CBD measures 6 mm in diameter with a length of 9 mm. Pancreas: No main duct dilatation. Mild edema/inflammation involving the head of pancreas. No mass. Spleen:  Spleen is normal. Adrenals/Urinary Tract: Normal adrenal glands. Several small cystic lesions identified within the left kidney which are too small to reliably characterize. No hydronephrosis. Stomach/Bowel: Visualized portions within the abdomen are unremarkable. Vascular/Lymphatic: Normal appearance of the abdominal aorta. No abdominopelvic adenopathy. The portal vein and splenic vein remain patent. Intrahepatic veins are patent. Other:  No significant free fluid or fluid collection. Musculoskeletal: No suspicious bone lesions identified. IMPRESSION: 1. Common bile duct dilatation with 6 x 9 mm distal CBD stone. 2. Mild pancreatitis. No evidence for pseudocyst or pancreatic necrosis. 3. Hepatic steatosis with morphologic features of liver suggestive of early cirrhosis. Electronically Signed   By: Kerby Moors M.D.   On: 05/27/2019 18:44   Dg Ercp  Result Date: 05/29/2019 CLINICAL DATA:  68 year old female undergoing ERCP for choledocholithiasis EXAM: ERCP TECHNIQUE: Multiple spot images obtained with the fluoroscopic device and submitted for interpretation post-procedure. FLUOROSCOPY TIME:  Fluoroscopy Time:  2 minutes 25 seconds Radiation Exposure Index (if provided by the fluoroscopic device): 24.9 mGy COMPARISON:  MRCP 05/27/2019 FINDINGS: Eleven saved intraoperative images are submitted for review. The images demonstrate a flexible endoscope in the descending duodenum with wire cannulation of the intrahepatic ducts. Subsequent cholangiogram  demonstrates multiple filling defects in the distal common bile duct concerning for choledocholithiasis. The images then go on to document sphincterotomy and balloon sweeping of the common duct. IMPRESSION: 1. Choledocholithiasis. 2. ERCP with sphincterotomy and balloon sweep of the common duct. These images were submitted for radiologic interpretation only. Please see the procedural report for the amount of contrast and the fluoroscopy time utilized. Electronically Signed   By: Jacqulynn Cadet M.D.   On: 05/29/2019 14:52   Mr Abdomen Mrcp Moise Boring Contast  Result Date: 05/27/2019 CLINICAL DATA:  Jaundice, abdominal Blackburn, fevers. EXAM: MRI ABDOMEN WITHOUT AND WITH CONTRAST (INCLUDING MRCP) TECHNIQUE: Multiplanar multisequence MR imaging of the abdomen was performed both before and after the administration of intravenous contrast. Heavily T2-weighted images of the biliary and pancreatic ducts were obtained, and three-dimensional MRCP images were rendered by post processing. CONTRAST:  62mL GADAVIST GADOBUTROL 1 MMOL/ML IV SOLN COMPARISON:  05/27/2019 FINDINGS: Lower chest: No acute findings. Hepatobiliary: Mild hepatic steatosis. Hypertrophy of the lateral segment of left lobe of liver and caudate lobe of liver identified. Heterogeneous early arterial phase enhancement with uniform enhancement on the portal venous and delayed phase images. No focal liver abnormality. Previous cholecystectomy. Mild intrahepatic bile duct dilatation. The common bile duct is increased in caliber measuring up to 9 mm, image 73/5. Stone within the distal CBD measures 6 mm in diameter with a length of 9 mm. Pancreas: No main duct dilatation. Mild edema/inflammation involving the head of pancreas. No mass. Spleen:  Spleen is normal. Adrenals/Urinary Tract: Normal adrenal glands. Several small cystic lesions identified within the left kidney which are too small to reliably characterize. No hydronephrosis. Stomach/Bowel: Visualized portions  within the abdomen are unremarkable. Vascular/Lymphatic: Normal appearance of the abdominal aorta. No  abdominopelvic adenopathy. The portal vein and splenic vein remain patent. Intrahepatic veins are patent. Other:  No significant free fluid or fluid collection. Musculoskeletal: No suspicious bone lesions identified. IMPRESSION: 1. Common bile duct dilatation with 6 x 9 mm distal CBD stone. 2. Mild pancreatitis. No evidence for pseudocyst or pancreatic necrosis. 3. Hepatic steatosis with morphologic features of liver suggestive of early cirrhosis. Electronically Signed   By: Kerby Moors M.D.   On: 05/27/2019 18:44   US Abdomen Limited Ruq  Result Date: 05/27/2019 CLINICAL DATA:  Right upper quadrant Blackburn for 6 months EXAM: ULTRASOUND ABDOMEN LIMITED RIGHT UPPER QUADRANT COMPARISON:  Abdominal CT from earlier today FINDINGS: Gallbladder: Surgically absent Common bile duct: Diameter: 7 mm.  Where visualized, no filling defect. Liver: No focal lesion identified. Within normal limits in parenchymal echogenicity. Portal vein is patent on color Doppler imaging with normal direction of blood flow towards the liver. IMPRESSION: Negative right upper quadrant ultrasound after cholecystectomy. Electronically Signed   By: Monte Fantasia M.D.   On: 05/27/2019 06:31      Procedures: ECRP with sphincterectomy and stone removal.   Subjective: Patient is feeling well, no nausea or vomiting, abdominal Blackburn has improved no nausea or vomiting.   Discharge Exam: Vitals:   05/30/19 2015 05/31/19 0630  BP: (!) 174/76 (!) 167/59  Pulse: 61 (!) 58  Resp: 18 16  Temp: 98.5 F (36.9 C) 98.2 F (36.8 C)  SpO2: 97% 95%   Vitals:   05/30/19 0517 05/30/19 1351 05/30/19 2015 05/31/19 0630  BP: 118/63 138/71 (!) 174/76 (!) 167/59  Pulse: (!) 51 (!) 52 61 (!) 58  Resp: 20 20 18 16   Temp: 98 F (36.7 C) 98 F (36.7 C) 98.5 F (36.9 C) 98.2 F (36.8 C)  TempSrc: Oral Oral Oral Oral  SpO2: 95% 95% 97% 95%   Weight:      Height:        General: Not in Blackburn or dyspnea  Neurology: Awake and alert, non focal  E ENT: no pallor, no icterus, oral mucosa moist Cardiovascular: No JVD. S1-S2 present, rhythmic, no gallops, rubs, or murmurs. No lower extremity edema. Pulmonary: vesicular breath sounds bilaterally, adequate air movement, no wheezing, rhonchi or rales. Gastrointestinal. Abdomen with no organomegaly, non tender, no rebound or guarding Skin. No rashes Musculoskeletal: no joint deformities   The results of significant diagnostics from this hospitalization (including imaging, microbiology, ancillary and laboratory) are listed below for reference.     Microbiology: Recent Results (from the past 240 hour(s))  SARS Coronavirus 2 [facility] order, Performed in [facility] hospital lab) Nasopharyngeal Nasopharyngeal Swab     Status: None   Collection Time: 05/27/19  3:59 AM   Specimen: Nasopharyngeal Swab  Result Value Ref Range Status   SARS Coronavirus 2 NEGATIVE NEGATIVE Final    Comment: (NOTE) If result is NEGATIVE SARS-CoV-2 target nucleic acids are NOT DETECTED. The SARS-CoV-2 RNA is generally detectable in upper and lower  respiratory specimens during the acute phase of infection. The lowest  concentration of SARS-CoV-2 viral copies this assay can detect is 250  copies / mL. A negative result does not preclude SARS-CoV-2 infection  and should not be used as the sole basis for treatment or other  patient management decisions.  A negative result may occur with  improper specimen collection / handling, submission of specimen other  than nasopharyngeal swab, presence of viral mutation(s) within the  areas targeted by this assay, and inadequate number of viral copies  (<  250 copies / mL). A negative result must be combined with clinical  observations, patient history, and epidemiological information. If result is POSITIVE SARS-CoV-2 target nucleic acids are DETECTED. The SARS-CoV-2  RNA is generally detectable in upper and lower  respiratory specimens dur ing the acute phase of infection.  Positive  results are indicative of active infection with SARS-CoV-2.  Clinical  correlation with patient history and other diagnostic information is  necessary to determine patient infection status.  Positive results do  not rule out bacterial infection or co-infection with other viruses. If result is PRESUMPTIVE POSTIVE SARS-CoV-2 nucleic acids MAY BE PRESENT.   A presumptive positive result was obtained on the submitted specimen  and confirmed on repeat testing.  While 2019 novel coronavirus  (SARS-CoV-2) nucleic acids may be present in the submitted sample  additional confirmatory testing may be necessary for epidemiological  and / or clinical management purposes  to differentiate between  SARS-CoV-2 and other Sarbecovirus currently known to infect humans.  If clinically indicated additional testing with an alternate test  methodology [phone]) is advised. The SARS-CoV-2 RNA is generally  detectable in upper and lower respiratory sp ecimens during the acute  phase of infection. The expected result is Negative. Fact Sheet for Patients:  StrictlyIdeas.no Fact Sheet for Healthcare Providers: BankingDealers.co.za This test is not yet approved or cleared by the [place] FDA and has been authorized for detection and/or diagnosis of SARS-CoV-2 by FDA under an Emergency Use Authorization (EUA).  This EUA will remain in effect (meaning this test can be used) for the duration of the COVID-19 declaration under Section 564(b)(1) of the Act, 21 U.S.C. section 360bbb-3(b)(1), unless the authorization is terminated or revoked sooner. Performed at [facility], [place] [address], [place], [place] [address]      Labs: BNP (last 3 results) No results for input(s): BNP in the last 8760 hours. Basic Metabolic  Panel: Recent Labs  Lab 05/26/19 2138 05/27/19 0615 05/28/19 0518 05/29/19 0544 05/30/19 0537  NA 139 137 140 139 139  K 3.4* 3.8 3.4* 3.7 3.8  CL 105 107 108 106 106  CO2 24 21* 24 24 24   GLUCOSE 116* 119* 121* 92 108*  BUN 13 12 9 10 13   CREATININE 0.94 0.76  0.80 0.83 0.96 0.91  CALCIUM 9.3 8.6* 8.7* 8.7* 8.7*   Liver Function Tests: Recent Labs  Lab 05/26/19 2138 05/27/19 0615 05/28/19 0518 05/29/19 0544 05/30/19 0537  AST 231* 222* 144* 100* 85*  ALT 372* 354* 296* 233* 199*  ALKPHOS 243* 219* 208* 199* 197*  BILITOT 4.2* 5.4* 5.0* 2.8* 1.9*  PROT 7.6 7.0 6.8 6.6 6.6  ALBUMIN 4.1 3.4* 3.5 3.4* 3.3*   Recent Labs  Lab 05/26/19 2138  LIPASE 29   No results for input(s): AMMONIA in the last 168 hours. CBC: Recent Labs  Lab 05/26/19 2138 05/27/19 0615 05/28/19 0518 05/29/19 0544 05/30/19 0537  WBC 7.4 11.1* 5.8 6.0 8.3  NEUTROABS  --   --   --  3.0 5.7  HGB 14.7 13.2 12.6 12.5 12.6  HCT 46.1* 40.3 40.7 41.2 40.9  MCV 93.1 91.6 94.0 96.7 96.0  PLT 233 221 211 230 252   Cardiac Enzymes: No results for input(s): CKTOTAL, CKMB, CKMBINDEX, TROPONINI in the last 168 hours. BNP: Invalid input(s): POCBNP CBG: No results for input(s): GLUCAP in the last 168 hours. D-Dimer No results for input(s): DDIMER in the last 72 hours. Hgb A1c No results for input(s): HGBA1C in the  last 72 hours. Lipid Profile No results for input(s): CHOL, HDL, LDLCALC, TRIG, CHOLHDL, LDLDIRECT in the last 72 hours. Thyroid function studies No results for input(s): TSH, T4TOTAL, T3FREE, THYROIDAB in the last 72 hours.  Invalid input(s): FREET3 Anemia work up No results for input(s): VITAMINB12, FOLATE, FERRITIN, TIBC, IRON, RETICCTPCT in the last 72 hours. Urinalysis    Component Value Date/Time   COLORURINE AMBER (A) 05/27/2019 1600   APPEARANCEUR CLEAR 05/27/2019 1600   LABSPEC 1.028 05/27/2019 1600   PHURINE 5.0 05/27/2019 1600   GLUCOSEU NEGATIVE 05/27/2019 1600    HGBUR SMALL (A) 05/27/2019 1600   BILIRUBINUR MODERATE (A) 05/27/2019 1600   BILIRUBINUR neg 09/14/2016 1158   KETONESUR NEGATIVE 05/27/2019 1600   PROTEINUR NEGATIVE 05/27/2019 1600   UROBILINOGEN negative 09/14/2016 1158   UROBILINOGEN 0.2 06/07/2014 0005   NITRITE NEGATIVE 05/27/2019 1600   LEUKOCYTESUR NEGATIVE 05/27/2019 1600   Sepsis Labs Invalid input(s): PROCALCITONIN,  WBC,  LACTICIDVEN Microbiology Recent Results (from the past 240 hour(s))  SARS Coronavirus 2 [facility] order, Performed in [facility] hospital lab) Nasopharyngeal Nasopharyngeal Swab     Status: None   Collection Time: 05/27/19  3:59 AM   Specimen: Nasopharyngeal Swab  Result Value Ref Range Status   SARS Coronavirus 2 NEGATIVE NEGATIVE Final    Comment: (NOTE) If result is NEGATIVE SARS-CoV-2 target nucleic acids are NOT DETECTED. The SARS-CoV-2 RNA is generally detectable in upper and lower  respiratory specimens during the acute phase of infection. The lowest  concentration of SARS-CoV-2 viral copies this assay can detect is 250  copies / mL. A negative result does not preclude SARS-CoV-2 infection  and should not be used as the sole basis for treatment or other  patient management decisions.  A negative result may occur with  improper specimen collection / handling, submission of specimen other  than nasopharyngeal swab, presence of viral mutation(s) within the  areas targeted by this assay, and inadequate number of viral copies  (<250 copies / mL). A negative result must be combined with clinical  observations, patient history, and epidemiological information. If result is POSITIVE SARS-CoV-2 target nucleic acids are DETECTED. The SARS-CoV-2 RNA is generally detectable in upper and lower  respiratory specimens dur ing the acute phase of infection.  Positive  results are indicative of active infection with SARS-CoV-2.  Clinical  correlation with patient history and other diagnostic information is   necessary to determine patient infection status.  Positive results do  not rule out bacterial infection or co-infection with other viruses. If result is PRESUMPTIVE POSTIVE SARS-CoV-2 nucleic acids MAY BE PRESENT.   A presumptive positive result was obtained on the submitted specimen  and confirmed on repeat testing.  While 2019 novel coronavirus  (SARS-CoV-2) nucleic acids may be present in the submitted sample  additional confirmatory testing may be necessary for epidemiological  and / or clinical management purposes  to differentiate between  SARS-CoV-2 and other Sarbecovirus currently known to infect humans.  If clinically indicated additional testing with an alternate test  methodology [phone]) is advised. The SARS-CoV-2 RNA is generally  detectable in upper and lower respiratory sp ecimens during the acute  phase of infection. The expected result is Negative. Fact Sheet for Patients:  StrictlyIdeas.no Fact Sheet for Healthcare Providers: BankingDealers.co.za This test is not yet approved or cleared by the [place] FDA and has been authorized for detection and/or diagnosis of SARS-CoV-2 by FDA under an Emergency Use Authorization (EUA).  This EUA will remain in effect (  meaning this test can be used) for the duration of the COVID-19 declaration under Section 564(b)(1) of the Act, 21 U.S.C. section 360bbb-3(b)(1), unless the authorization is terminated or revoked sooner. Performed at [facility], [place] [address], [place], [place] [address]      Time coordinating discharge: 45 minutes  SIGNED:   Tawni Millers, MD  [facility] 05/31/2019, 11:25 AM

## 2019-06-02 ENCOUNTER — Telehealth: Payer: Self-pay | Admitting: *Deleted

## 2019-06-02 ENCOUNTER — Encounter: Payer: Self-pay | Admitting: Gastroenterology

## 2019-06-02 NOTE — Telephone Encounter (Signed)
Transition Care Management Follow-up Telephone Call   Date discharged? 05/31/2019   How have you been since you were released from the hospital?  "I'm doing okay, just still weak"   Do you understand why you were in the hospital? yes   Do you understand the discharge instructions? no  Where were you discharged to? Home   Items Reviewed:  Medications reviewed: yes  Allergies reviewed: yes  Dietary changes reviewed: yes  Referrals reviewed: yes   Functional Questionnaire:   Activities of Daily Living (ADLs):   She states they are independent in the following: ambulation, bathing and hygiene, feeding, continence, grooming, toileting and dressing States they require assistance with the following: N/A    Any transportation issues/concerns?: no   Any patient concerns? no   Confirmed importance and date/time of follow-up visits scheduled yes  Provider Appointment booked with Dr. Elease Hashimoto 06/06/2019 at 1:30 PM  Confirmed with patient if condition begins to worsen call PCP or go to the ER.  Patient was given the office number and encouraged to call back with question or concerns.  : yes

## 2019-06-06 ENCOUNTER — Ambulatory Visit (INDEPENDENT_AMBULATORY_CARE_PROVIDER_SITE_OTHER): Payer: Medicare Other | Admitting: Family Medicine

## 2019-06-06 ENCOUNTER — Other Ambulatory Visit: Payer: Self-pay

## 2019-06-06 ENCOUNTER — Encounter: Payer: Self-pay | Admitting: Family Medicine

## 2019-06-06 VITALS — BP 122/62 | HR 89 | Temp 97.7°F | Wt 207.3 lb

## 2019-06-06 DIAGNOSIS — I10 Essential (primary) hypertension: Secondary | ICD-10-CM

## 2019-06-06 DIAGNOSIS — K219 Gastro-esophageal reflux disease without esophagitis: Secondary | ICD-10-CM

## 2019-06-06 DIAGNOSIS — R7401 Elevation of levels of liver transaminase levels: Secondary | ICD-10-CM

## 2019-06-06 DIAGNOSIS — K859 Acute pancreatitis without necrosis or infection, unspecified: Secondary | ICD-10-CM | POA: Diagnosis not present

## 2019-06-06 DIAGNOSIS — Z23 Encounter for immunization: Secondary | ICD-10-CM | POA: Diagnosis not present

## 2019-06-06 LAB — HEPATIC FUNCTION PANEL
ALT: 101 U/L — ABNORMAL HIGH (ref 0–35)
AST: 48 U/L — ABNORMAL HIGH (ref 0–37)
Albumin: 4.7 g/dL (ref 3.5–5.2)
Alkaline Phosphatase: 199 U/L — ABNORMAL HIGH (ref 39–117)
Bilirubin, Direct: 0.4 mg/dL — ABNORMAL HIGH (ref 0.0–0.3)
Total Bilirubin: 1.3 mg/dL — ABNORMAL HIGH (ref 0.2–1.2)
Total Protein: 7.8 g/dL (ref 6.0–8.3)

## 2019-06-06 MED ORDER — OXYBUTYNIN CHLORIDE ER 10 MG PO TB24: 10.0000 mg | ORAL_TABLET | Freq: Every day | ORAL | 3 refills | Status: DC

## 2019-06-06 MED ORDER — PANTOPRAZOLE SODIUM 40 MG PO TBEC: 40.0000 mg | DELAYED_RELEASE_TABLET | Freq: Every day | ORAL | 3 refills | Status: DC

## 2019-06-06 NOTE — Progress Notes (Signed)
Subjective:     Patient ID: Linda Blackburn, female   DOB: September 12, 1950, 68 y.o.   MRN: [id number]  HPI Patient here for hospital follow-up.  She has longstanding history of recurrent pancreatitis.  She was admitted on the 21st with abdominal pain epigastric area along with nausea and vomiting.  Her vital signs were stable with exception of slightly low blood pressure.  Also, O2 sats were down slightly.  Her initial labs were significant for alkaline phosphatase 243, lipase 29, AST 231, ALT 372, total bilirubin 4.2.  CT abdomen showed changes consistent with acute pancreatitis.  She underwent MRCP which showed bile duct dilatation and distal common bile duct stone.  No evidence for pancreatic pseudocyst or necrosis.  She underwent ERCP with successful removal of stone  Patient had mild hypokalemia which was corrected.  She improved with oral intake after her procedure above.  Initial blood pressure was low and her losartan was held during admission but she is now back on that now.  She denies any hypotension episodes or orthostatic symptoms  She has history of GERD which is controlled on Protonix.  She is requesting refills of Protonix and oxybutynin through [facility]  Past Medical History:  Diagnosis Date  . Abnormal uterine bleeding   . Amenorrhea   . Anxiety   . Depression   . Gallstones   . Hay fever   . HTN (hypertension)   . Pancreatitis   . Urine incontinence   . UTI (urinary tract infection)    reoccuring    Past Surgical History:  Procedure Laterality Date  . ABDOMINAL HYSTERECTOMY  1984   fibroids  . APPENDECTOMY  1984  . BILIARY DILATION  05/29/2019   Procedure: BILIARY DILATION;  Surgeon: Rush Landmark Telford Nab., MD;  Location: [facility];  Service: Gastroenterology;;  . BIOPSY  05/29/2019   Procedure: BIOPSY;  Surgeon: Irving Copas., MD;  Location: [facility];  Service: Gastroenterology;;  . [name]  09/08/1999  . ERCP N/A 05/29/2019   Procedure:  ENDOSCOPIC RETROGRADE CHOLANGIOPANCREATOGRAPHY (ERCP);  Surgeon: Irving Copas., MD;  Location: [facility];  Service: Gastroenterology;  Laterality: N/A;  . ERCP W/ SPHINCTEROTOMY AND BALLOON DILATION  08/2008  . ESOPHAGOGASTRODUODENOSCOPY (EGD) WITH PROPOFOL N/A 05/29/2019   Procedure: ESOPHAGOGASTRODUODENOSCOPY (EGD) WITH PROPOFOL;  Surgeon: Rush Landmark Telford Nab., MD;  Location: [facility];  Service: Gastroenterology;  Laterality: N/A;  . OOPHORECTOMY     Only 1 was taken  . POLYPECTOMY  05/29/2019   Procedure: POLYPECTOMY;  Surgeon: Mansouraty, Telford Nab., MD;  Location: [facility];  Service: Gastroenterology;;  . REMOVAL OF STONES  05/29/2019   Procedure: REMOVAL OF STONES;  Surgeon: Irving Copas., MD;  Location: [facility];  Service: Gastroenterology;;  . TONSILLECTOMY    . TUBAL LIGATION      reports that she has never smoked. She has never used smokeless tobacco. She reports current alcohol use. She reports that she does not use drugs. family history includes Alcohol abuse in her mother; Cerebral palsy in her granddaughter; Hiatal hernia in her maternal aunt; Prostate cancer in her father and paternal grandfather; Stroke in her maternal aunt. Allergies  Allergen Reactions  . Other Hives and Swelling    AQUASONIC [place] GEL  . Buprenorphine Hcl     "In a scarry movie" weird thoughts  . Codeine     GI upset  . Dilaudid [Hydromorphone Hcl] Itching  . Morphine And Related     "In a scarry movie" weird thoughts  Review of Systems  Constitutional: Negative for chills and fever.  Respiratory: Negative for shortness of breath.   Cardiovascular: Negative for chest pain.  Gastrointestinal: Negative for abdominal distention, blood in stool, nausea and vomiting.  Genitourinary: Negative for dysuria.  Neurological: Negative for dizziness.       Objective:   Physical Exam Constitutional:      Appearance: Normal appearance.  Cardiovascular:     Rate and  Rhythm: Normal rate and regular rhythm.  Pulmonary:     Effort: Pulmonary effort is normal.     Breath sounds: Normal breath sounds.  Abdominal:     Comments: Still has some mild tenderness epigastric area.  No guarding or rebound.  Neurological:     Mental Status: She is alert.        Assessment:     Recent acute pancreatitis with common bile duct stone which was removed by ERCP.  Elevated liver transaminases which should be improving.  Overall improved.  Still has some general fatigue    Plan:     -Flu vaccine given -Recheck hepatic panel -Refilled her oxybutynin and Protonix through [facility] -She will set up physical for sometime around January  [name] MD [facility]

## 2019-06-08 ENCOUNTER — Encounter: Payer: Self-pay | Admitting: Family Medicine

## 2019-06-10 ENCOUNTER — Ambulatory Visit: Payer: Medicare Other

## 2019-06-26 ENCOUNTER — Ambulatory Visit
Admission: RE | Admit: 2019-06-26 | Discharge: 2019-06-26 | Disposition: A | Discharge status: Home / Self Care | Payer: Medicare Other | Source: Ambulatory Visit | Attending: Family Medicine | Admitting: Family Medicine

## 2019-06-26 ENCOUNTER — Other Ambulatory Visit: Payer: Self-pay

## 2019-06-26 DIAGNOSIS — Z1231 Encounter for screening mammogram for malignant neoplasm of breast: Secondary | ICD-10-CM

## 2019-07-26 IMAGING — DX DG CHEST 1V PORT
1 series · 1 of 1 positions shown · non-contrast
Comparison: March 21, 2016

CLINICAL DATA: Chest pain

EXAM:
PORTABLE CHEST 1 VIEW

[chest ap]
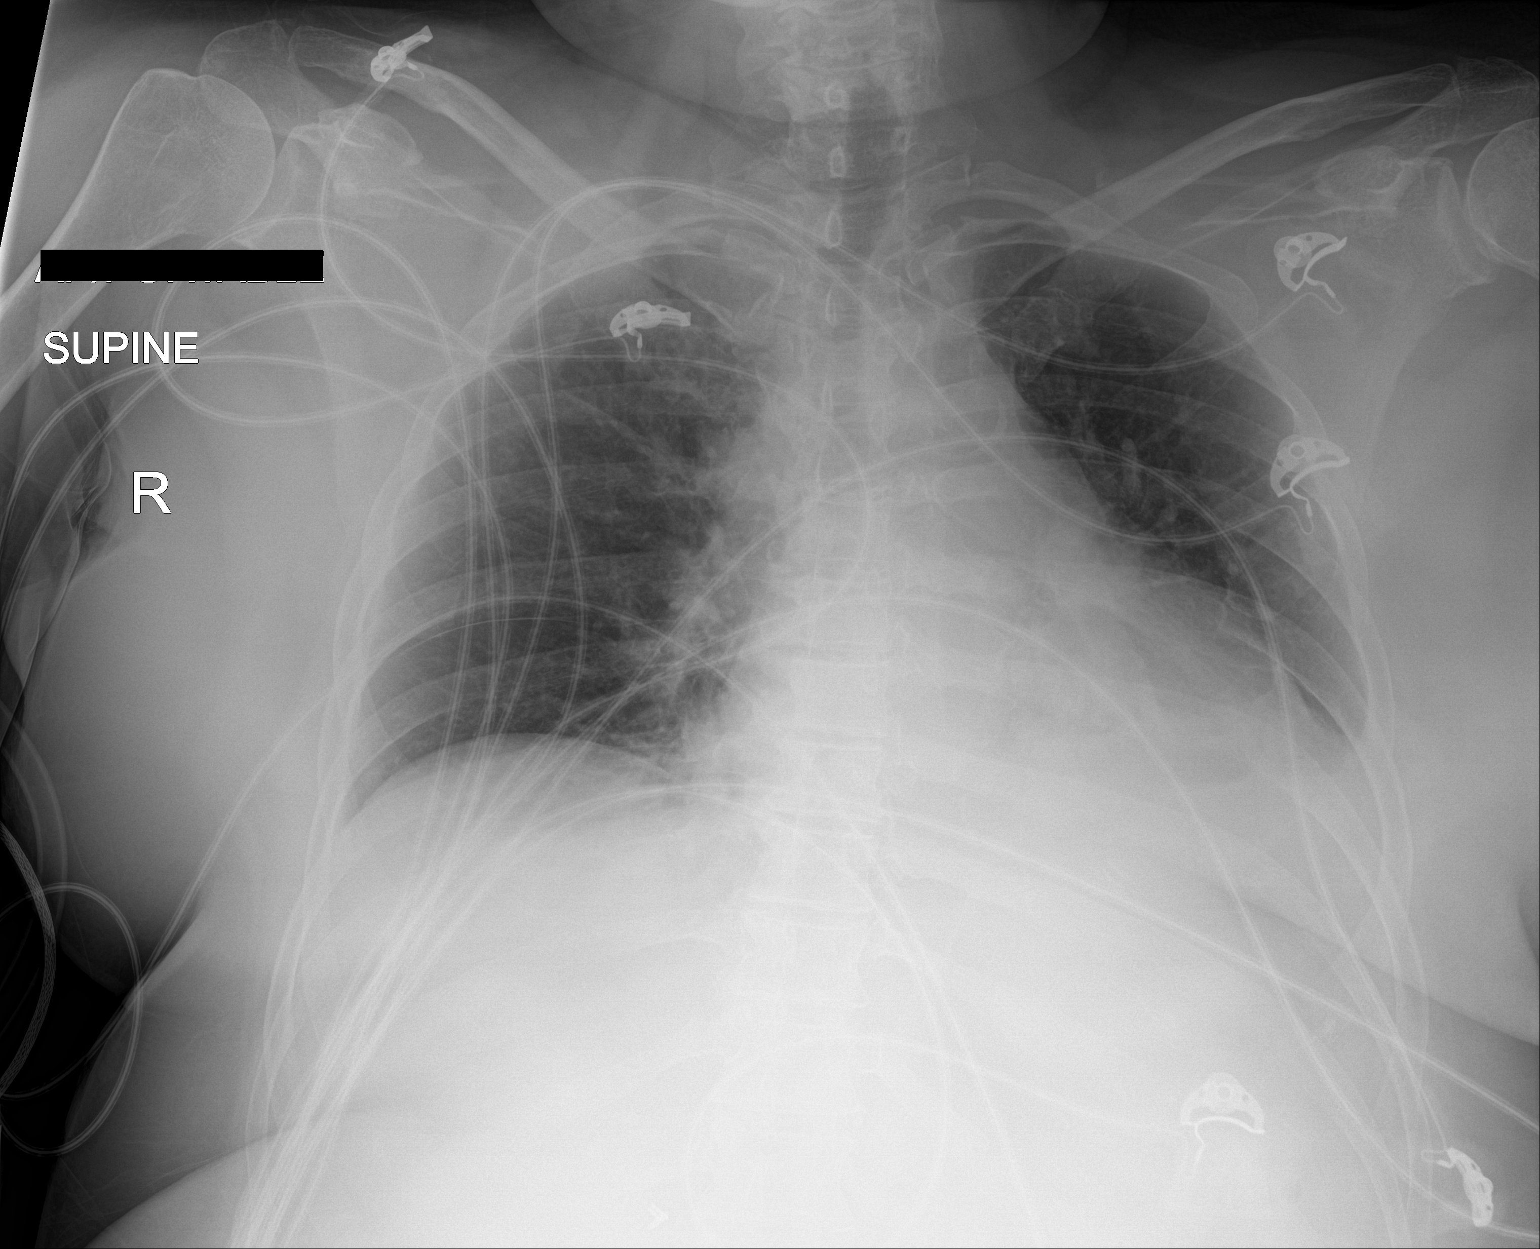

[1 of 1 positions shown; findings below may reference images not displayed]

FINDINGS: There is airspace consolidation in the left lower lobe with small
left pleural effusion. The lungs elsewhere are clear. Heart is
mildly enlarged with pulmonary vascularity within normal limits. No
adenopathy. There is aortic atherosclerosis. No evident bone
lesions.
IMPRESSION: Left lower lobe airspace consolidation with small left pleural
effusion. Lungs elsewhere clear. Heart mildly enlarged, stable.
There is aortic atherosclerosis.

Aortic Atherosclerosis ([id number]-JK8.8).

## 2019-07-26 IMAGING — CT CT ABD-PELV W/O CM
2 of 4 series · 17 of 46 positions shown, 19 images · non-contrast
Comparison: 03/21/2016

CLINICAL DATA: Abdominal distention

EXAM:
CT ABDOMEN AND PELVIS WITHOUT CONTRAST
TECHNIQUE: Multidetector CT imaging of the abdomen and pelvis was performed
following the standard protocol without IV contrast.

[Series 2: axial st · axial · 0.98mm/px · z∈[+1040,+1505]mm · 14 of 103 slices shown, 16 images]
[im 5/103  soft-tissue]
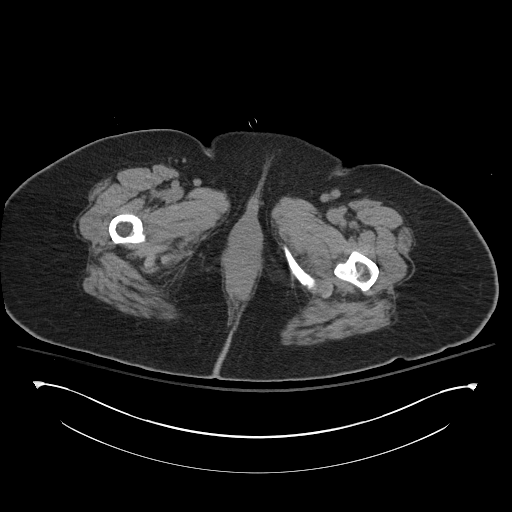
[im 5/103  bone]
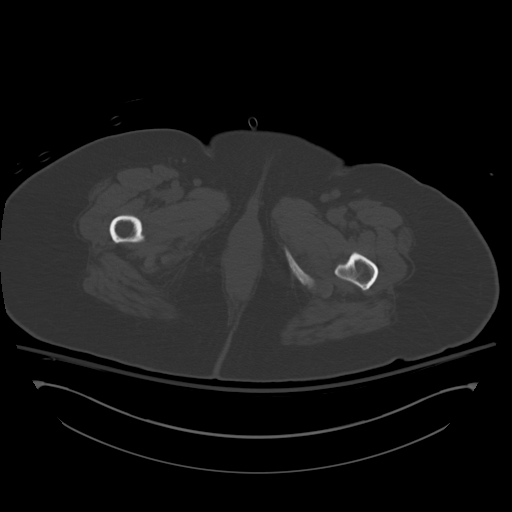
[im 15/103  soft-tissue]
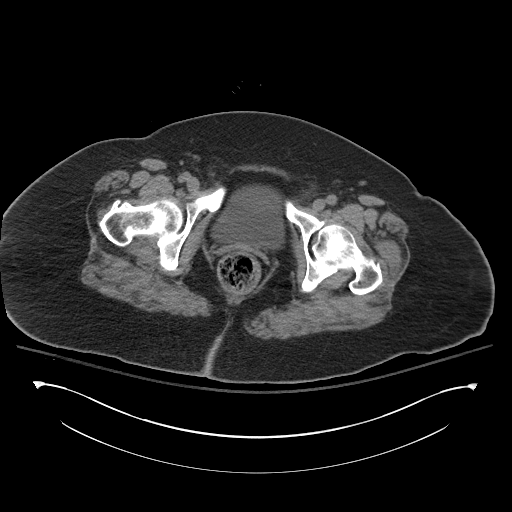
[im 20/103  soft-tissue]
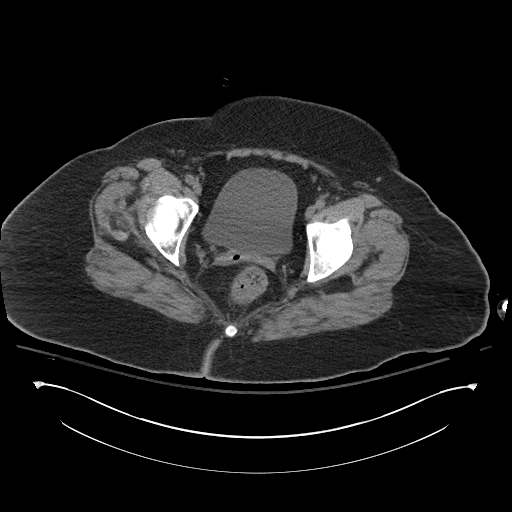
[im 30/103  soft-tissue]
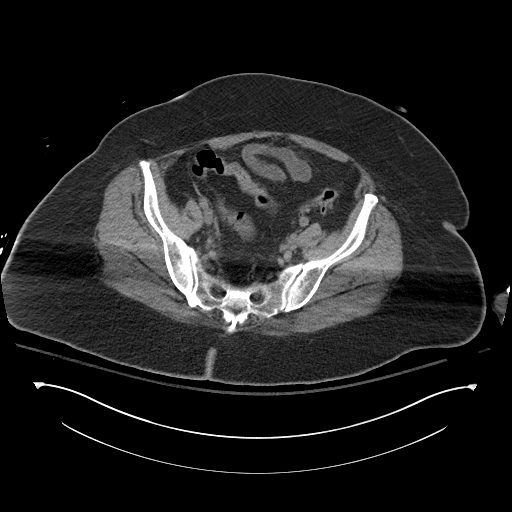
[im 35/103  soft-tissue]
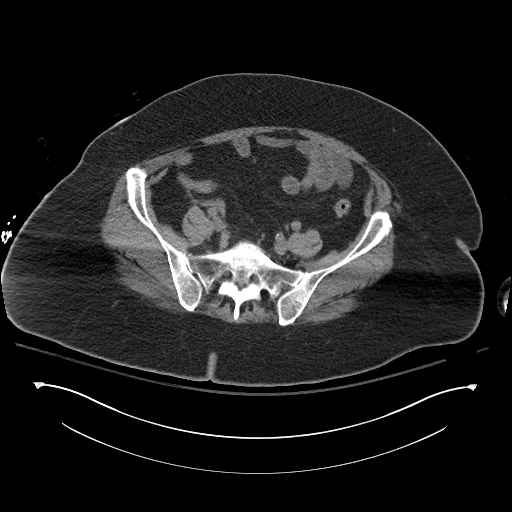
[im 39/103  soft-tissue]
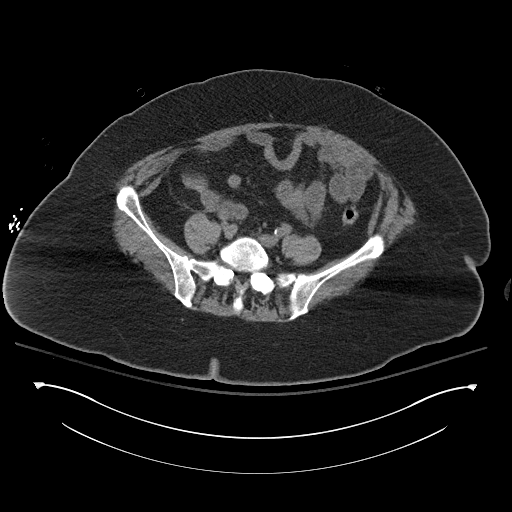
[im 49/103  soft-tissue]
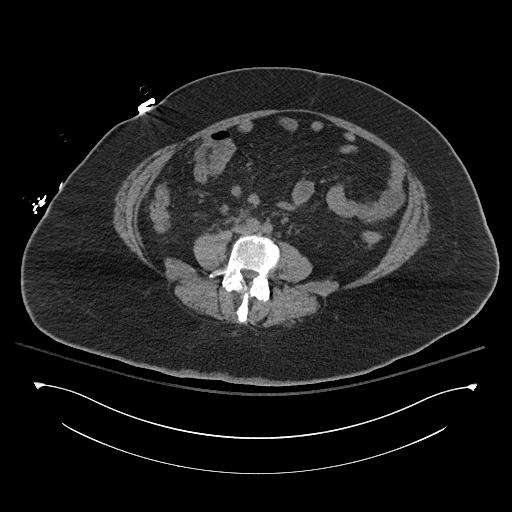
[im 54/103  soft-tissue]
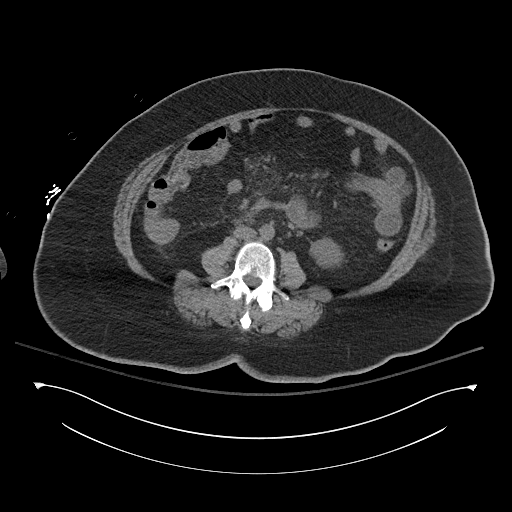
[im 64/103  soft-tissue]
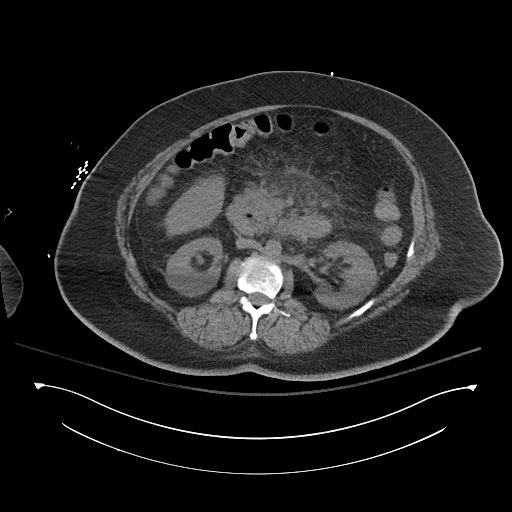
[im 64/103  bone]
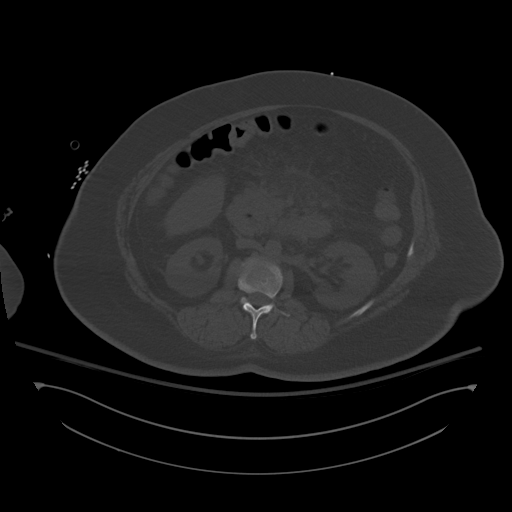
[im 69/103  soft-tissue]
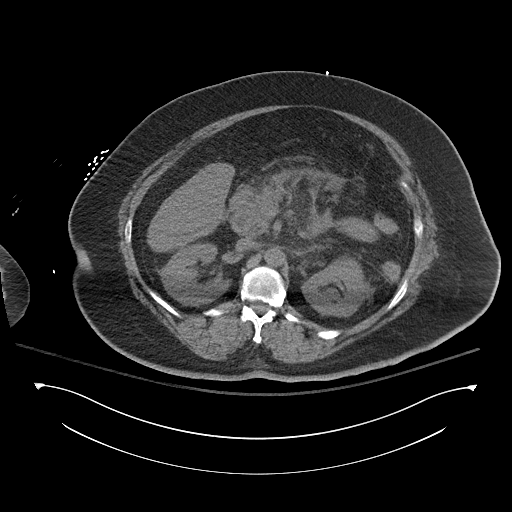
[im 78/103  soft-tissue]
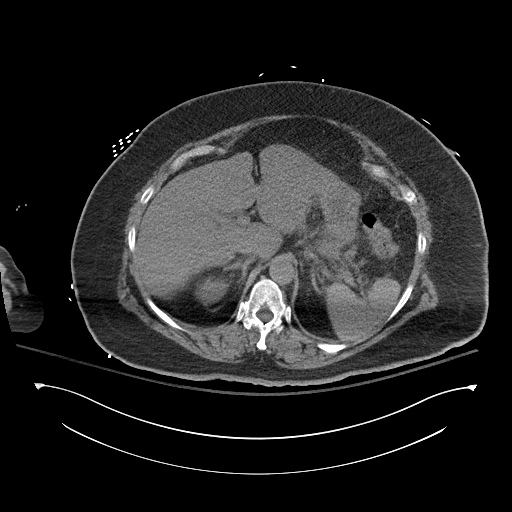
[im 83/103  soft-tissue]
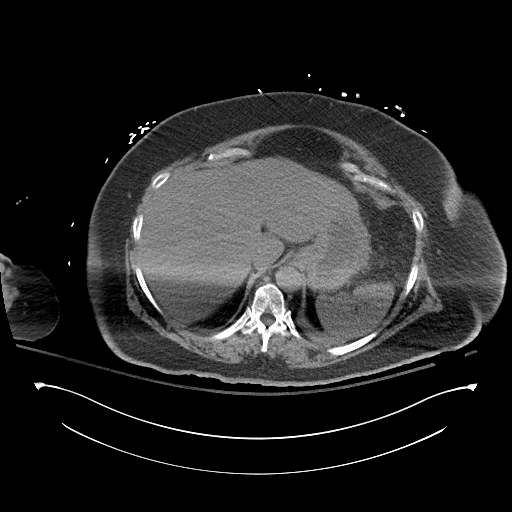
[im 88/103  soft-tissue]
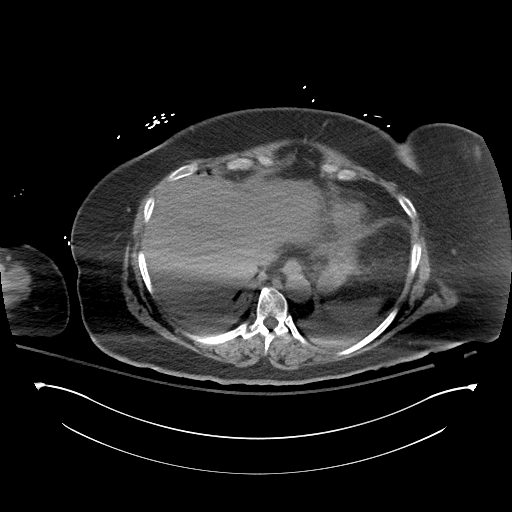
[im 98/103  soft-tissue]
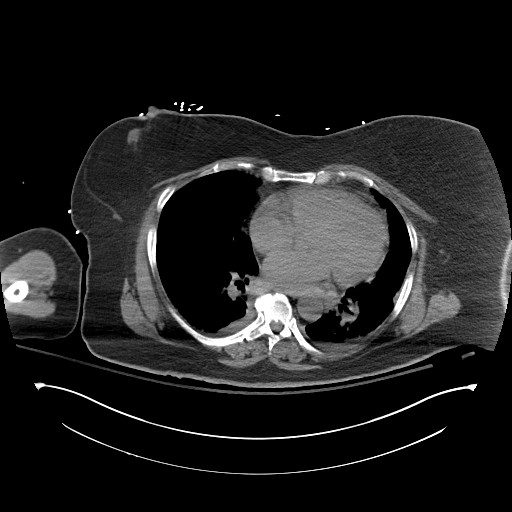

[Series 4: coronal st · coronal · 0.90mm/px · 3 of 111 slices shown]
[im 37/111  soft-tissue]
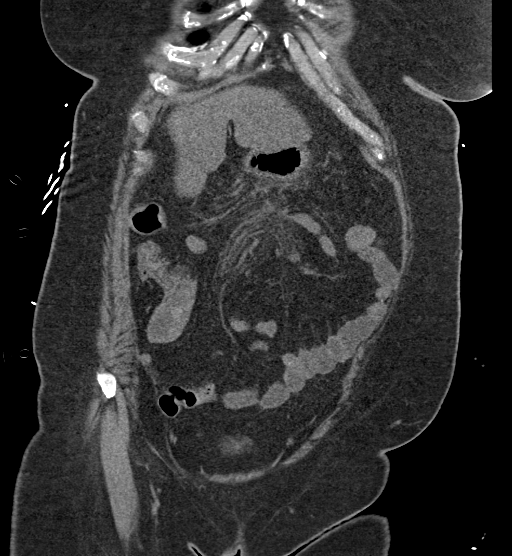
[im 49/111  soft-tissue]
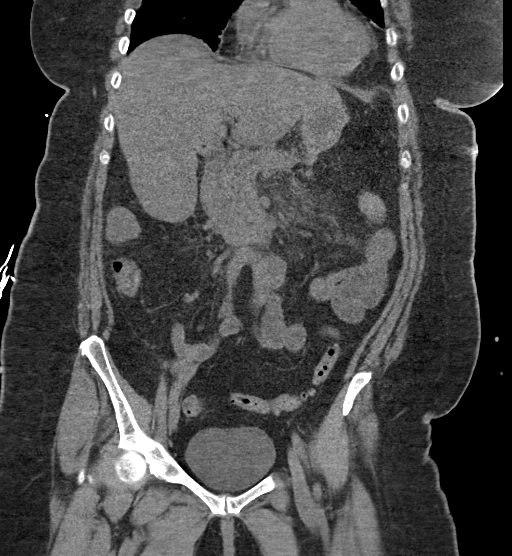
[im 62/111  soft-tissue]
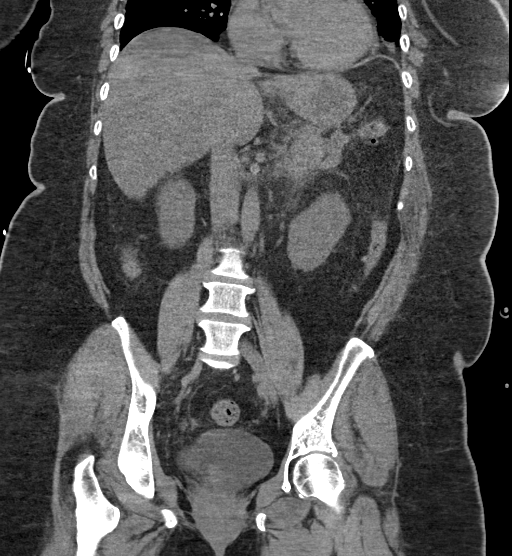

[17 of 46 positions shown; findings below may reference images not displayed]

FINDINGS: Lower chest:  Lower lobe atelectasis, multi segment on the left.

Hepatobiliary: Possible hepatic steatosis.Cholecystectomy. No
visible choledocholithiasis.

Pancreas: Diffuse expansion and peripancreatic edema. Gas at the
level of the head is stable and attributed to a duodenal
diverticulum. No gross fluid collection.

Spleen: Unremarkable.

Adrenals/Urinary Tract: Negative adrenals. No hydronephrosis or
stone. Unremarkable bladder.

Stomach/Bowel: No obstruction. Appendectomy. Sigmoid diverticulosis.

Vascular/Lymphatic: Mild atherosclerotic calcification. No mass or
adenopathy.

Reproductive:Hysterectomy

Other: No ascites or pneumoperitoneum.

Musculoskeletal: Degenerative changes without acute or aggressive
finding.
IMPRESSION: 1. Acute pancreatitis.
2. Multi segment atelectasis.
3. Cholecystectomy

## 2019-08-15 ENCOUNTER — Other Ambulatory Visit: Payer: Self-pay

## 2019-08-18 ENCOUNTER — Ambulatory Visit (INDEPENDENT_AMBULATORY_CARE_PROVIDER_SITE_OTHER): Payer: Medicare Other | Admitting: Family Medicine

## 2019-08-18 ENCOUNTER — Other Ambulatory Visit: Payer: Self-pay

## 2019-08-18 ENCOUNTER — Encounter: Payer: Self-pay | Admitting: Family Medicine

## 2019-08-18 VITALS — BP 118/78 | HR 76 | Temp 97.6°F | Ht 61.25 in | Wt 215.6 lb

## 2019-08-18 DIAGNOSIS — I10 Essential (primary) hypertension: Secondary | ICD-10-CM

## 2019-08-18 DIAGNOSIS — F339 Major depressive disorder, recurrent, unspecified: Secondary | ICD-10-CM

## 2019-08-18 DIAGNOSIS — R0600 Dyspnea, unspecified: Secondary | ICD-10-CM | POA: Diagnosis not present

## 2019-08-18 DIAGNOSIS — K219 Gastro-esophageal reflux disease without esophagitis: Secondary | ICD-10-CM

## 2019-08-18 MED ORDER — LOSARTAN POTASSIUM 100 MG PO TABS: 100.0000 mg | ORAL_TABLET | Freq: Every day | ORAL | 3 refills | Status: DC

## 2019-08-18 MED ORDER — SERTRALINE HCL 100 MG PO TABS: 100.0000 mg | ORAL_TABLET | Freq: Every day | ORAL | 3 refills | Status: DC

## 2019-08-18 MED ORDER — ONDANSETRON 4 MG PO TBDP: 4.0000 mg | ORAL_TABLET | Freq: Three times a day (TID) | ORAL | 3 refills | Status: DC | PRN

## 2019-08-18 NOTE — Patient Instructions (Signed)
Set up 30 minute follow up for this Friday (pt requests early afternoon).

## 2019-08-18 NOTE — Progress Notes (Signed)
Subjective:     Patient ID: Linda Blackburn, female   DOB: 08/25/1951, 68 y.o.   MRN: [id number]  HPI   Linda Blackburn is here needing several medication refills.  She has history of recurrent pancreatitis, GERD, hypertension, recurrent depression.  She is needing refills of losartan, sertraline, and Zofran for as needed use.  Her blood pressures been stable.  No recent dizziness.  No headaches.  She feels her depression is relatively stable on sertraline.  She still has recurrent bouts of nausea intermittently but currently no abdominal pain.  Requesting refills of Zofran which helps with her nausea symptoms.  She has chronic insomnia and takes Ambien low dosage only sporadically for severe episodes.  She is requesting handicap placard.  She has difficulty walking more than 200 feet without stopping to rest.  She is limited by some dyspnea.  She has had some weight gain in recent years and realizes that is probably part of her problem.  No recent chest pains  Wt Readings from Last 3 Encounters:  08/18/19 215 lb 9.6 oz (97.8 kg)  06/06/19 207 lb 4.8 oz (94 kg)  05/27/19 211 lb 10.3 oz (96 kg)     Past Medical History:  Diagnosis Date  . Abnormal uterine bleeding   . Amenorrhea   . Anxiety   . Depression   . Gallstones   . Hay fever   . HTN (hypertension)   . Pancreatitis   . Urine incontinence   . UTI (urinary tract infection)    reoccuring    Past Surgical History:  Procedure Laterality Date  . ABDOMINAL HYSTERECTOMY  1984   fibroids  . APPENDECTOMY  1984  . BILIARY DILATION  05/29/2019   Procedure: BILIARY DILATION;  Surgeon: Rush Landmark Telford Nab., MD;  Location: [facility];  Service: Gastroenterology;;  . BIOPSY  05/29/2019   Procedure: BIOPSY;  Surgeon: Irving Copas., MD;  Location: [facility];  Service: Gastroenterology;;  . [name]  09/08/1999  . ERCP N/A 05/29/2019   Procedure: ENDOSCOPIC RETROGRADE CHOLANGIOPANCREATOGRAPHY (ERCP);  Surgeon:  Irving Copas., MD;  Location: [facility];  Service: Gastroenterology;  Laterality: N/A;  . ERCP W/ SPHINCTEROTOMY AND BALLOON DILATION  08/2008  . ESOPHAGOGASTRODUODENOSCOPY (EGD) WITH PROPOFOL N/A 05/29/2019   Procedure: ESOPHAGOGASTRODUODENOSCOPY (EGD) WITH PROPOFOL;  Surgeon: Rush Landmark Telford Nab., MD;  Location: [facility];  Service: Gastroenterology;  Laterality: N/A;  . OOPHORECTOMY     Only 1 was taken  . POLYPECTOMY  05/29/2019   Procedure: POLYPECTOMY;  Surgeon: Mansouraty, Telford Nab., MD;  Location: [facility];  Service: Gastroenterology;;  . REMOVAL OF STONES  05/29/2019   Procedure: REMOVAL OF STONES;  Surgeon: Irving Copas., MD;  Location: [facility];  Service: Gastroenterology;;  . TONSILLECTOMY    . TUBAL LIGATION      reports that she has never smoked. She has never used smokeless tobacco. She reports current alcohol use. She reports that she does not use drugs. family history includes Alcohol abuse in her mother; Cerebral palsy in her granddaughter; Hiatal hernia in her maternal aunt; Prostate cancer in her father and paternal grandfather; Stroke in her maternal aunt. Allergies  Allergen Reactions  . Other Hives and Swelling    AQUASONIC [place] GEL  . Buprenorphine Hcl     "In a scarry movie" weird thoughts  . Codeine     GI upset  . Dilaudid [Hydromorphone Hcl] Itching  . Morphine And Related     "In a scarry movie" weird thoughts  Review of Systems  Constitutional: Negative for appetite change, fatigue and unexpected weight change.  Eyes: Negative for visual disturbance.  Respiratory: Negative for cough, chest tightness, shortness of breath and wheezing.   Cardiovascular: Negative for chest pain, palpitations and leg swelling.  Gastrointestinal: Negative for abdominal pain.  Genitourinary: Negative for dysuria.  Neurological: Negative for dizziness, seizures, syncope, weakness, light-headedness and headaches.       Objective:    Physical Exam Constitutional:      Appearance: She is well-developed.  Eyes:     Pupils: Pupils are equal, round, and reactive to light.  Neck:     Thyroid: No thyromegaly.     Vascular: No JVD.  Cardiovascular:     Rate and Rhythm: Normal rate and regular rhythm.     Heart sounds: No gallop.   Pulmonary:     Effort: Pulmonary effort is normal. No respiratory distress.     Breath sounds: Normal breath sounds. No wheezing or rales.  Musculoskeletal:     Cervical back: Neck supple.     Right lower leg: No edema.     Left lower leg: No edema.  Neurological:     Mental Status: She is alert.        Assessment:     #1 hypertension stable and at goal  #2 history of recurrent pancreatitis  #3 history of recurrent depression currently stable on sertraline  #4 dyspnea with exertion probably related to deconditioning and weight gain    Plan:     -She is encouraged to lose some weight -Refill sertraline and losartan for 1 year -Refill Zofran for as needed use -Patient had several questions regarding various skin lesions.  She has scattered seborrheic keratoses and small nodular lesion anterior chest with past history of basal cell carcinoma.  We suggested that she reschedule to have these assessed and she will come in this Friday for that  [name] MD [facility]

## 2019-08-21 ENCOUNTER — Other Ambulatory Visit: Payer: Self-pay

## 2019-08-22 ENCOUNTER — Encounter: Payer: Self-pay | Admitting: Family Medicine

## 2019-08-22 ENCOUNTER — Ambulatory Visit (INDEPENDENT_AMBULATORY_CARE_PROVIDER_SITE_OTHER): Payer: Medicare Other | Admitting: Family Medicine

## 2019-08-22 VITALS — BP 118/64 | HR 85 | Temp 97.7°F | Wt 216.3 lb

## 2019-08-22 DIAGNOSIS — Q828 Other specified congenital malformations of skin: Secondary | ICD-10-CM | POA: Diagnosis not present

## 2019-08-22 DIAGNOSIS — L821 Other seborrheic keratosis: Secondary | ICD-10-CM

## 2019-08-22 NOTE — Progress Notes (Signed)
Subjective:     Patient ID: Linda Blackburn, female   DOB: Jul 16, 1951, 68 y.o.   MRN: [id number]  HPI   Linda Blackburn is here requesting several skin lesions to be treated.  She has several keratoses on her chest and one on her lower back and these are irritated because of where they occur and frequently rub against clothing.  They sometimes itch.  She has had treatment with liquid nitrogen in the past and has tolerated well.  She also has several skin tags that are irritating because of location.    Past Medical History:  Diagnosis Date  . Abnormal uterine bleeding   . Amenorrhea   . Anxiety   . Depression   . Gallstones   . Hay fever   . HTN (hypertension)   . Pancreatitis   . Urine incontinence   . UTI (urinary tract infection)    reoccuring    Past Surgical History:  Procedure Laterality Date  . ABDOMINAL HYSTERECTOMY  1984   fibroids  . APPENDECTOMY  1984  . BILIARY DILATION  05/29/2019   Procedure: BILIARY DILATION;  Surgeon: Rush Landmark Telford Nab., MD;  Location: [facility];  Service: Gastroenterology;;  . BIOPSY  05/29/2019   Procedure: BIOPSY;  Surgeon: Irving Copas., MD;  Location: [facility];  Service: Gastroenterology;;  . [name]  09/08/1999  . ERCP N/A 05/29/2019   Procedure: ENDOSCOPIC RETROGRADE CHOLANGIOPANCREATOGRAPHY (ERCP);  Surgeon: Irving Copas., MD;  Location: [facility];  Service: Gastroenterology;  Laterality: N/A;  . ERCP W/ SPHINCTEROTOMY AND BALLOON DILATION  08/2008  . ESOPHAGOGASTRODUODENOSCOPY (EGD) WITH PROPOFOL N/A 05/29/2019   Procedure: ESOPHAGOGASTRODUODENOSCOPY (EGD) WITH PROPOFOL;  Surgeon: Rush Landmark Telford Nab., MD;  Location: [facility];  Service: Gastroenterology;  Laterality: N/A;  . OOPHORECTOMY     Only 1 was taken  . POLYPECTOMY  05/29/2019   Procedure: POLYPECTOMY;  Surgeon: Mansouraty, Telford Nab., MD;  Location: [facility];  Service: Gastroenterology;;  . REMOVAL OF STONES  05/29/2019   Procedure:  REMOVAL OF STONES;  Surgeon: Irving Copas., MD;  Location: [facility];  Service: Gastroenterology;;  . TONSILLECTOMY    . TUBAL LIGATION      reports that she has never smoked. She has never used smokeless tobacco. She reports current alcohol use. She reports that she does not use drugs. family history includes Alcohol abuse in her mother; Cerebral palsy in her granddaughter; Hiatal hernia in her maternal aunt; Prostate cancer in her father and paternal grandfather; Stroke in her maternal aunt. Allergies  Allergen Reactions  . Other Hives and Swelling    AQUASONIC [place] GEL  . Buprenorphine Hcl     "In a scarry movie" weird thoughts  . Codeine     GI upset  . Dilaudid [Hydromorphone Hcl] Itching  . Morphine And Related     "In a scarry movie" weird thoughts     Review of Systems  Constitutional: Negative for appetite change, chills, fever and unexpected weight change.  Respiratory: Negative for cough.        Objective:   Physical Exam Vitals reviewed.  Cardiovascular:     Rate and Rhythm: Normal rate and regular rhythm.  Skin:    Comments: Patient has several scattered skin tags including on the anterior neck region and anterior chest.  She has a couple under the right breast and also couple right axillary region.  She has brownish well-demarcated scaly lesion under the left breast and a similar lesion posterior back  Assessment:     Multiple skin tags including neck, right axillary region, anterior chest irritating because of location  She has a couple seborrheic keratoses.  These have totally benign features and one in the left breast 1 lower back region midline    Plan:     -Discussed risk and benefits of treatment with liquid nitrogen and patient consented.  We discussed risk of blistering, infection, pain.  She tolerated well.  A total of 8 skin tags were treated -She knows to follow-up if these are not resolving following treatment with liquid  nitrogen  [name] MD [facility] at [facility]

## 2019-08-22 NOTE — Patient Instructions (Signed)
Skin Tag, Adult  A skin tag (acrochordon) is a soft, extra growth of skin. Most skin tags are flesh-colored and rarely bigger than a pencil eraser. They commonly form near areas where there are folds in the skin, such as the armpit or groin. Skin tags are not dangerous, and they do not spread from person to person (are not contagious). You may have one skin tag or several. Skin tags do not require treatment. However, your health care provider may recommend removal of a skin tag if it:  Gets irritated from clothing.  Bleeds.  Is visible and unsightly. Your health care provider can remove skin tags with a simple surgical procedure or a procedure that involves freezing the skin tag. Follow these instructions at home:  Watch for any changes in your skin tag. A normal skin tag does not require any other special care at home.  Take over-the-counter and prescription medicines only as told by your health care provider.  Keep all follow-up visits as told by your health care provider. This is important. Contact a health care provider if:  You have a skin tag that: ? Becomes painful. ? Changes color. ? Bleeds. ? Swells.  You develop more skin tags. This information is not intended to replace advice given to you by your health care provider. Make sure you discuss any questions you have with your health care provider. Document Released: 09/05/2015 Document Revised: 08/03/2017 Document Reviewed: 09/05/2015 Elsevier Patient Education  2020 [place]. Seborrheic Keratosis A seborrheic keratosis is a common, noncancerous (benign) skin growth. These growths are velvety, waxy, rough, tan, brown, or black spots that appear on the skin. These skin growths can be flat or raised, and scaly. What are the causes? The cause of this condition is not known. What increases the risk? You are more likely to develop this condition if you:  Have a family history of seborrheic keratosis.  Are 50 or  older.  Are pregnant.  Have had estrogen replacement therapy. What are the signs or symptoms? Symptoms of this condition include growths on the face, chest, shoulders, back, or other areas. These growths:  Are usually painless, but may become irritated and itchy.  Can be yellow, brown, black, or other colors.  Are slightly raised or have a flat surface.  Are sometimes rough or wart-like in texture.  Are often velvety or waxy on the surface.  Are round or oval-shaped.  Often occur in groups, but may occur as a single growth. How is this diagnosed? This condition is diagnosed with a medical history and physical exam.  A sample of the growth may be tested (skin biopsy).  You may need to see a skin specialist (dermatologist). How is this treated? Treatment is not usually needed for this condition, unless the growths are irritated or bleed often.  You may also choose to have the growths removed if you do not like their appearance. ? Most commonly, these growths are treated with a procedure in which liquid nitrogen is applied to "freeze" off the growth (cryosurgery). ? They may also be burned off with electricity (electrocautery) or removed by scraping (curettage). Follow these instructions at home:  Watch your growth for any changes.  Keep all follow-up visits as told by your health care provider. This is important.  Do not scratch or pick at the growth or growths. This can cause them to become irritated or infected. Contact a health care provider if:  You suddenly have many new growths.  Your growth bleeds,  itches, or hurts.  Your growth suddenly becomes larger or changes color. Summary  A seborrheic keratosis is a common, noncancerous (benign) skin growth.  Treatment is not usually needed for this condition, unless the growths are irritated or bleed often.  Watch your growth for any changes.  Contact a health care provider if you suddenly have many new growths or  your growth suddenly becomes larger or changes color.  Keep all follow-up visits as told by your health care provider. This is important. This information is not intended to replace advice given to you by your health care provider. Make sure you discuss any questions you have with your health care provider. Document Released: 09/23/2010 Document Revised: 01/03/2018 Document Reviewed: 01/03/2018 Elsevier Patient Education  2020 [employer].

## 2019-10-12 ENCOUNTER — Ambulatory Visit: Payer: Medicare Other | Attending: Internal Medicine

## 2019-10-12 DIAGNOSIS — Z23 Encounter for immunization: Secondary | ICD-10-CM | POA: Insufficient documentation

## 2019-10-12 NOTE — Progress Notes (Signed)
   Covid-19 Vaccination Clinic  Name:  Abigail Birchenough    MRN: [id number] DOB: June 10, 1951  10/12/2019  Ms. Brightman was observed post Covid-19 immunization for 15 minutes without incidence. She was provided with Vaccine Information Sheet and instruction to access the V-Safe system.   Ms. Lehnert was instructed to call 911 with any severe reactions post vaccine: Marland Kitchen Difficulty breathing  . Swelling of your face and throat  . A fast heartbeat  . A bad rash all over your body  . Dizziness and weakness    Immunizations Administered    Name Date Dose VIS Date Route   Pfizer COVID-19 Vaccine 10/12/2019  3:17 PM 0.3 mL 08/15/2019 Intramuscular   Manufacturer: [place]   Lot: [id number]   [place]: [id number]

## 2019-10-13 ENCOUNTER — Other Ambulatory Visit: Payer: Self-pay

## 2019-10-14 ENCOUNTER — Ambulatory Visit (INDEPENDENT_AMBULATORY_CARE_PROVIDER_SITE_OTHER): Payer: Medicare Other | Admitting: Family Medicine

## 2019-10-14 ENCOUNTER — Encounter: Payer: Self-pay | Admitting: Family Medicine

## 2019-10-14 ENCOUNTER — Other Ambulatory Visit: Payer: Self-pay

## 2019-10-14 ENCOUNTER — Ambulatory Visit (INDEPENDENT_AMBULATORY_CARE_PROVIDER_SITE_OTHER): Payer: Medicare Other

## 2019-10-14 VITALS — Ht 61.0 in | Wt 216.0 lb

## 2019-10-14 VITALS — BP 112/70 | HR 83 | Temp 97.5°F | Ht 61.25 in | Wt 215.3 lb

## 2019-10-14 DIAGNOSIS — Z0000 Encounter for general adult medical examination without abnormal findings: Secondary | ICD-10-CM

## 2019-10-14 DIAGNOSIS — Z780 Asymptomatic menopausal state: Secondary | ICD-10-CM | POA: Diagnosis not present

## 2019-10-14 DIAGNOSIS — Z1211 Encounter for screening for malignant neoplasm of colon: Secondary | ICD-10-CM | POA: Diagnosis not present

## 2019-10-14 DIAGNOSIS — Z13820 Encounter for screening for osteoporosis: Secondary | ICD-10-CM

## 2019-10-14 DIAGNOSIS — R350 Frequency of micturition: Secondary | ICD-10-CM

## 2019-10-14 LAB — POCT URINALYSIS DIPSTICK
Bilirubin, UA: NEGATIVE
Blood, UA: POSITIVE
Glucose, UA: NEGATIVE
Ketones, UA: NEGATIVE
Nitrite, UA: NEGATIVE
Protein, UA: POSITIVE — AB
Spec Grav, UA: 1.02 (ref 1.010–1.025)
Urobilinogen, UA: 0.2 E.U./dL
pH, UA: 5.5 (ref 5.0–8.0)

## 2019-10-14 MED ORDER — FLUCONAZOLE 100 MG PO TABS: 100.0000 mg | ORAL_TABLET | Freq: Every day | ORAL | 0 refills | Status: DC

## 2019-10-14 MED ORDER — CEPHALEXIN 500 MG PO CAPS: 500.0000 mg | ORAL_CAPSULE | Freq: Three times a day (TID) | ORAL | 0 refills | Status: DC

## 2019-10-14 NOTE — Progress Notes (Signed)
This visit is being conducted via phone call due to the COVID-19 pandemic. This patient has given me verbal consent via phone to conduct this visit, patient states they are participating from their home address. Some vital signs may be absent or patient reported.   Patient identification: identified by name, DOB, and current address.  Location provider: [facility], Office Persons participating in the virtual visit: Ms. Harbor Hembree and Franne Forts, LPN.    Subjective:   Linda Blackburn is a 69 y.o. female who presents for Medicare Annual (Subsequent) preventive examination.  She received first covid vaccine on 10/12/19 and had no problems. She reports that she is walking about 2-3 times a week for 15 minutes each. She feels "winded" if she walks to much since her most recent hospitalization of pancreatitis.   Review of Systems:  No ROS: Annual Medicare Wellness visit today Cardiac Risk Factors include: sedentary lifestyle;hypertension;obesity (BMI >30kg/m2);dyslipidemia    Objective:     Vitals: Ht 5\' 1"  (1.549 m)   Wt 216 lb (98 kg)   BMI 40.81 kg/m   Body mass index is 40.81 kg/m.  Advanced Directives 10/14/2019 05/27/2019 05/26/2019 11/19/2018 10/11/2018 08/09/2018 08/08/2018  Does Patient Have a Medical Advance Directive? Yes Yes Yes No Yes Yes No  Type of Paramedic of [place];Living will [place];Living will [place];Living will - [place];Living will Living will -  Does patient want to make changes to medical advance directive? No - Patient declined No - Patient declined No - Patient declined - No - Patient declined No - Patient declined -  Copy of Page in Chart? No - copy requested No - copy requested No - copy requested - No - copy requested - -  Would patient like information on creating a medical advance directive? - - - No - Patient declined - - -     Tobacco Social History   Tobacco Use  Smoking Status Never Smoker  Smokeless Tobacco Never Used     Counseling given: Not Answered   Clinical Intake:  Pre-visit preparation completed: Yes  Pain : No/denies pain     BMI - recorded: 40.81 Nutritional Status: BMI > 30  Obese Nutritional Risks: None Diabetes: No  How often do you need to have someone help you when you read instructions, pamphlets, or other written materials from your doctor or pharmacy?: 1 - Never  Interpreter Needed?: No  Information entered by :: Franne Forts, LPN.  Past Medical History:  Diagnosis Date  . Abnormal uterine bleeding   . Amenorrhea   . Anxiety   . Depression   . Gallstones   . Hay fever   . HTN (hypertension)   . Pancreatitis   . Urine incontinence   . UTI (urinary tract infection)    reoccuring    Past Surgical History:  Procedure Laterality Date  . ABDOMINAL HYSTERECTOMY  1984   fibroids  . APPENDECTOMY  1984  . BILIARY DILATION  05/29/2019   Procedure: BILIARY DILATION;  Surgeon: Rush Landmark Telford Nab., MD;  Location: [facility];  Service: Gastroenterology;;  . BIOPSY  05/29/2019   Procedure: BIOPSY;  Surgeon: Irving Copas., MD;  Location: [facility];  Service: Gastroenterology;;  . [name]  09/08/1999  . ERCP N/A 05/29/2019   Procedure: ENDOSCOPIC RETROGRADE CHOLANGIOPANCREATOGRAPHY (ERCP);  Surgeon: Irving Copas., MD;  Location: [facility];  Service: Gastroenterology;  Laterality: N/A;  . ERCP W/ SPHINCTEROTOMY AND BALLOON  DILATION  08/2008  . ESOPHAGOGASTRODUODENOSCOPY (EGD) WITH PROPOFOL N/A 05/29/2019   Procedure: ESOPHAGOGASTRODUODENOSCOPY (EGD) WITH PROPOFOL;  Surgeon: Rush Landmark Telford Nab., MD;  Location: [facility];  Service: Gastroenterology;  Laterality: N/A;  . OOPHORECTOMY     Only 1 was taken  . POLYPECTOMY  05/29/2019   Procedure: POLYPECTOMY;  Surgeon: Mansouraty, Telford Nab., MD;  Location: [facility];  Service:  Gastroenterology;;  . REMOVAL OF STONES  05/29/2019   Procedure: REMOVAL OF STONES;  Surgeon: Irving Copas., MD;  Location: [facility];  Service: Gastroenterology;;  . TONSILLECTOMY    . TUBAL LIGATION     Family History  Problem Relation Age of Onset  . Alcohol abuse Mother   . Prostate cancer Father   . Stroke Maternal Aunt   . Hiatal hernia Maternal Aunt   . Prostate cancer Paternal Grandfather   . Cerebral palsy Granddaughter   . Breast cancer Neg Hx    Social History   Socioeconomic History  . Marital status: Widowed    Spouse name: Not on file  . Number of children: 2  . Years of education: Not on file  . Highest education level: Not on file  Occupational History  . Occupation: retired  Tobacco Use  . Smoking status: Never Smoker  . Smokeless tobacco: Never Used  Substance and Sexual Activity  . Alcohol use: Yes    Alcohol/week: 0.0 standard drinks    Comment: occ - hardly every   . Drug use: No  . Sexual activity: Not Currently    Partners: Male    Birth control/protection: Surgical  Other Topics Concern  . Not on file  Social History Narrative   10/14/2019: Lives alone in apartment.    Has two sons, one in [place] and one in [place], 5 grandchildren    Has considered moving closer to sons but likes [place] too much.   Neighbors good social support   Enjoys watching soap operas, walking when she feels well, but recently struggling with more frequent episodes of pancreatitis requiring hospitalization.   Keeps positive attitude, "one day at a time"      Social Determinants of Health   Financial Resource Strain: Low Risk   . Difficulty of Paying Living Expenses: Not hard at all  Food Insecurity: No Food Insecurity  . Worried About Charity fundraiser in the Last Year: Never true  . Ran Out of Food in the Last Year: Never true  Transportation Needs: No Transportation Needs  . Lack of Transportation (Medical): No  . Lack of Transportation (Non-Medical): No    Physical Activity: Insufficiently Active  . Days of Exercise per Week: 3 days  . Minutes of Exercise per Session: 20 min  Stress: Stress Concern Present  . Feeling of Stress : To some extent  Social Connections: Unknown  . Frequency of Communication with Friends and Family: More than three times a week  . Frequency of Social Gatherings with Friends and Family: Once a week  . Attends Religious Services: Not on file  . Active Member of Clubs or Organizations: Yes  . Attends Archivist Meetings: Not on file  . Marital Status: Widowed    Outpatient Encounter Medications as of 10/14/2019  Medication Sig  . HYDROcodone-acetaminophen (NORCO/VICODIN) 5-325 MG tablet Take 1 tablet by mouth every 6 (six) hours as needed for moderate pain.  Marland Kitchen losartan (COZAAR) 100 MG tablet Take 1 tablet (100 mg total) by mouth daily.  . naproxen sodium (ALEVE) 220 MG tablet  Take 440 mg by mouth daily as needed (pain).  . ondansetron (ZOFRAN-ODT) 4 MG disintegrating tablet Take 1 tablet (4 mg total) by mouth every 8 (eight) hours as needed.  Marland Kitchen oxybutynin (DITROPAN-XL) 10 MG 24 hr tablet Take 1 tablet (10 mg total) by mouth daily after breakfast.  . pantoprazole (PROTONIX) 40 MG tablet Take 1 tablet (40 mg total) by mouth daily at 6 (six) AM.  . sertraline (ZOLOFT) 100 MG tablet Take 1 tablet (100 mg total) by mouth daily.  Marland Kitchen zolpidem (AMBIEN) 5 MG tablet Take 1 tablet (5 mg total) by mouth at bedtime as needed for sleep.   No facility-administered encounter medications on file as of 10/14/2019.    Activities of Daily Living In your present state of health, do you have any difficulty performing the following activities: 10/14/2019 05/27/2019  Hearing? N N  Vision? N N  Difficulty concentrating or making decisions? N N  Walking or climbing stairs? N N  Dressing or bathing? N N  Doing errands, shopping? N N  Preparing Food and eating ? N -  Using the Toilet? N -  In the past six months, have you  accidently leaked urine? Y -  Do you have problems with loss of bowel control? N -  Managing your Medications? N -  Managing your Finances? N -  Housekeeping or managing your Housekeeping? N -  Some recent data might be hidden    Patient Care Team: [name], MD as PCP - General Milus Banister, MD as Attending Physician (Gastroenterology) Katy Apo, MD as Consulting Physician (Ophthalmology)    Assessment:   This is a routine wellness examination for [facility].  Exercise Activities and Dietary recommendations Current Exercise Habits: Home exercise routine, Type of exercise: walking, Time (Minutes): 20, Frequency (Times/Week): 3, Weekly Exercise (Minutes/Week): 60, Intensity: Moderate  Goals    . Patient Stated     Wants covid to improve so she can travel to visit her children in [facility] and [place]       Fall Risk Fall Risk  10/14/2019 04/03/2019 10/11/2018 08/10/2017 08/07/2016  Falls in the past year? 0 (No Data) 0 No No  Comment - Emmi Telephone Survey: data to providers prior to load - - -  Number falls in past yr: - (No Data) - - -  Comment - Emmi Telephone Survey Actual Response =  - - -  Risk for fall due to : Medication side effect - - - -  Follow up Falls evaluation completed;Education provided;Falls prevention discussed - - - -   Is the patient's home free of loose throw rugs in walkways, pet beds, electrical cords, etc?   yes      Grab bars in the bathroom? yes      Handrails on the stairs?   yes      Adequate lighting?   yes  Timed Get Up and Go performed: N/A due to telephone visit.  Depression Screen PHQ 2/9 Scores 10/14/2019 10/11/2018 08/10/2017 08/07/2016  PHQ - 2 Score 1 0 0 0  PHQ- 9 Score - 2 - -     Cognitive Function     6CIT Screen 10/14/2019  What Year? 0 points  What month? 0 points  What time? 0 points  Count back from 20 0 points  Months in reverse 0 points  Repeat phrase 0 points  Total Score 0    Immunization History  Administered Date(s)  Administered  . Fluad Quad(high Dose 65+) 06/06/2019  .  Influenza Split 06/28/2011, 07/10/2012  . Influenza, High Dose Seasonal PF 07/22/2015, 06/30/2016, 06/19/2017, 06/25/2018  . Influenza,inj,Quad PF,6+ Mos 07/16/2013, 06/16/2014  . PFIZER SARS-COV-2 Vaccination 10/12/2019  . Pneumococcal Conjugate-13 07/20/2014  . Pneumococcal Polysaccharide-23 07/22/2015  . Td 04/01/2010  . Zoster 08/14/2011    Qualifies for Shingles Vaccine?yes  Screening Tests Health Maintenance  Topic Date Due  . COLONOSCOPY  11/03/2019  . TETANUS/TDAP  04/01/2020  . MAMMOGRAM  06/25/2021  . INFLUENZA VACCINE  Completed  . DEXA SCAN  Completed  . Hepatitis C Screening  Completed  . PNA vac Low Risk Adult  Completed    Cancer Screenings: Lung: Low Dose CT Chest recommended if Age 29-80 years, 30 pack-year currently smoking OR have quit w/in 15years. Patient does not qualify. Breast:  Up to date on Mammogram? Yes   Up to date of Bone Density/Dexa? No; order sent to [facility] radiology [facility] Colorectal: yes; referral sent to [facility] for colonoscopy after 11/03/2019.   Additional Screenings:  Hepatitis C Screening: completed 08/07/2016.     Plan:   Ms. Basch understands that an order for DEXA was sent to [facility] radiology on [facility] and a referral was sent to [facility] endoscopy center for a colonoscopy due after 11/03/2019. She will check with her pharmacy for her out of pocket costs for shingrix vaccines. She will be due in July 2021 for an updated Tdap and understands that Medicare does not cover this cost and she can check with the [place] and her pharmacy for that cost as well. Offered list of counseling services in [place] area and patient declined at this time. Recommended she check out [employer] for their "Adventures in Learning" online classes.    I have personally reviewed and noted the following in the patient's chart:   . Medical and  social history . Use of alcohol, tobacco or illicit drugs  . Current medications and supplements . Functional ability and status . Nutritional status . Physical activity . Advanced directives . List of other physicians . Hospitalizations, surgeries, and ER visits in previous 12 months . Vitals . Screenings to include cognitive, depression, and falls . Referrals and appointments  In addition, I have reviewed and discussed with patient certain preventive protocols, quality metrics, and best practice recommendations. A written personalized care plan for preventive services as well as general preventive health recommendations were provided to patient.     Franne Forts, LPN  [id number]

## 2019-10-14 NOTE — Progress Notes (Signed)
Subjective:     Patient ID: Linda Blackburn, female   DOB: [birth year], 69 y.o.   MRN: [id number]  HPI [name] is seen with 1 week history of some intermittent dysuria.  She had onset last week and then felt somewhat better over the weekend and had recurrence this week.  She has burning with urination and some frequency.  No gross hematuria.  No recent UTI.  No flank pain.  No fevers or chills.  No nausea or vomiting.  Past Medical History:  Diagnosis Date  . Abnormal uterine bleeding   . Amenorrhea   . Anxiety   . Depression   . Gallstones   . Hay fever   . HTN (hypertension)   . Pancreatitis   . Urine incontinence   . UTI (urinary tract infection)    reoccuring    Past Surgical History:  Procedure Laterality Date  . ABDOMINAL HYSTERECTOMY  1984   fibroids  . APPENDECTOMY  1984  . BILIARY DILATION  05/29/2019   Procedure: BILIARY DILATION;  Surgeon: Rush Landmark Telford Nab., MD;  Location: [facility];  Service: Gastroenterology;;  . BIOPSY  05/29/2019   Procedure: BIOPSY;  Surgeon: Irving Copas., MD;  Location: [facility];  Service: Gastroenterology;;  . [name]  09/08/1999  . ERCP N/A 05/29/2019   Procedure: ENDOSCOPIC RETROGRADE CHOLANGIOPANCREATOGRAPHY (ERCP);  Surgeon: Irving Copas., MD;  Location: [facility];  Service: Gastroenterology;  Laterality: N/A;  . ERCP W/ SPHINCTEROTOMY AND BALLOON DILATION  08/2008  . ESOPHAGOGASTRODUODENOSCOPY (EGD) WITH PROPOFOL N/A 05/29/2019   Procedure: ESOPHAGOGASTRODUODENOSCOPY (EGD) WITH PROPOFOL;  Surgeon: Rush Landmark Telford Nab., MD;  Location: [facility];  Service: Gastroenterology;  Laterality: N/A;  . OOPHORECTOMY     Only 1 was taken  . POLYPECTOMY  05/29/2019   Procedure: POLYPECTOMY;  Surgeon: Mansouraty, Telford Nab., MD;  Location: [facility];  Service: Gastroenterology;;  . REMOVAL OF STONES  05/29/2019   Procedure: REMOVAL OF STONES;  Surgeon: Irving Copas., MD;  Location: [facility];  Service: Gastroenterology;;  . TONSILLECTOMY    . TUBAL LIGATION      reports that she has never smoked. She has never used smokeless tobacco. She reports current alcohol use. She reports that she does not use drugs. family history includes Alcohol abuse in her mother; Cerebral palsy in her granddaughter; Hiatal hernia in her maternal aunt; Prostate cancer in her father and paternal grandfather; Stroke in her maternal aunt. Allergies  Allergen Reactions  . Other Hives and Swelling    AQUASONIC [place] GEL  . Buprenorphine Hcl     "In a scarry movie" weird thoughts  . Codeine     GI upset  . Dilaudid [Hydromorphone Hcl] Itching  . Morphine And Related     "In a scarry movie" weird thoughts     Review of Systems  Constitutional: Negative for appetite change, chills and fever.  Gastrointestinal: Negative for abdominal pain, constipation, diarrhea, nausea and vomiting.  Genitourinary: Positive for dysuria and frequency.  Musculoskeletal: Negative for back pain.  Neurological: Negative for dizziness.       Objective:   Physical Exam Vitals reviewed.  Constitutional:      Appearance: Normal appearance.  Cardiovascular:     Rate and Rhythm: Normal rate and regular rhythm.  Pulmonary:     Effort: Pulmonary effort is normal.     Breath sounds: Normal breath sounds.  Neurological:     Mental Status: She is alert.        Assessment:  Dysuria.  Suspect uncomplicated cystitis.  Patient nontoxic in appearance    Plan:     -Urine culture sent -Keflex 500 mg 3 times daily for 5 days -Plenty of fluids and follow-up promptly for any fever or worsening symptoms  [name] MD [facility]

## 2019-10-14 NOTE — Patient Instructions (Signed)
Ms. Linda Blackburn , Thank you for taking time to participate in your Medicare Wellness Visit. I appreciate your ongoing commitment to your health goals. Please review the following plan we discussed and let me know if I can assist you in the future.   Screening recommendations/referrals: Colorectal Screening: colonoscopy 11/03/2014; due after 11/04/2019; referral sent to [facility] for Dr. Ardis Hughs. They will contact you to schedule.  Mammogram: completed 06/26/2019; due again after 06/25/2020.  Bone Density: completed 09/22/2015; due after 09/21/2018. Order sent to [facility] radiology [facility]. They will contact you to schedule.   Vision and Dental Exams: Recommended annual ophthalmology exams for early detection of glaucoma and other disorders of the eye Recommended annual dental exams for proper oral hygiene  Diabetic Exams: Diabetic Eye Exam: N/A Diabetic Foot Exam: N/A  Vaccinations: Influenza vaccine: completed 06/06/2019; due again in fall 2021. Pneumococcal vaccine: completed 07/20/2014 & 07/22/2015; up to date at this time.  Tdap vaccine: completed 04/01/2010; due again 04/01/2020. Please call your insurance company to determine your out of pocket expense. You may also receive this vaccine at your local pharmacy or Health Dept. Also at our office. Shingles vaccine: Please call your insurance company or pharmacy to determine your out of pocket expense for the Shingrix vaccine. You may receive this vaccine at your local pharmacy.  Advanced directives: Advance directives discussed with you today. Please bring a copy of your POA (Power of [place]) and/or Living Will to your next appointment.  Goals: Recommend to drink at least 6-8 8oz glasses of water per day.  Recommend to exercise for at least 150 minutes per week.  Recommend to remove any items from the home that may cause slips or trips.  Recommend to decrease portion sizes by eating 3 small healthy meals and at least 2  healthy snacks per day.  Check out [email or web address] for [employer] [facility] and click on "Adventures in Learning" as we discussed.   Please let us know if you change your mind about a list of counseling services in [place] area.   Next appointment: Please schedule your Annual Wellness Visit with your Nurse Health Advisor in one year.  Preventive Care 35 Years and Older, Female Preventive care refers to lifestyle choices and visits with your health care provider that can promote health and wellness. What does preventive care include?  A yearly physical exam. This is also called an annual well check.  Dental exams once or twice a year.  Routine eye exams. Ask your health care provider how often you should have your eyes checked.  Personal lifestyle choices, including:  Daily care of your teeth and gums.  Regular physical activity.  Eating a healthy diet.  Avoiding tobacco and drug use.  Limiting alcohol use.  Practicing safe sex.  Taking low-dose aspirin every day if recommended by your health care provider.  Taking vitamin and mineral supplements as recommended by your health care provider. What happens during an annual well check? The services and screenings done by your health care provider during your annual well check will depend on your age, overall health, lifestyle risk factors, and family history of disease. Counseling  Your health care provider may ask you questions about your:  Alcohol use.  Tobacco use.  Drug use.  Emotional well-being.  Home and relationship well-being.  Sexual activity.  Eating habits.  History of falls.  Memory and ability to understand (cognition).  Work and work Statistician.  Reproductive health. Screening  You may have the following tests  or measurements:  Height, weight, and BMI.  Blood pressure.  Lipid and cholesterol levels. These may be checked every 5 years, or more frequently if you are over 27 years  old.  Skin check.  Lung cancer screening. You may have this screening every year starting at age 73 if you have a 30-pack-year history of smoking and currently smoke or have quit within the past 15 years.  Fecal occult blood test (FOBT) of the stool. You may have this test every year starting at age 72.  Flexible sigmoidoscopy or colonoscopy. You may have a sigmoidoscopy every 5 years or a colonoscopy every 10 years starting at age 66.  Hepatitis C blood test.  Hepatitis B blood test.  Sexually transmitted disease (STD) testing.  Diabetes screening. This is done by checking your blood sugar (glucose) after you have not eaten for a while (fasting). You may have this done every 1-3 years.  Bone density scan. This is done to screen for osteoporosis. You may have this done starting at age 61.  Mammogram. This may be done every 1-2 years. Talk to your health care provider about how often you should have regular mammograms. Talk with your health care provider about your test results, treatment options, and if necessary, the need for more tests. Vaccines  Your health care provider may recommend certain vaccines, such as:  Influenza vaccine. This is recommended every year.  Tetanus, diphtheria, and acellular pertussis (Tdap, Td) vaccine. You may need a Td booster every 10 years.  Zoster vaccine. You may need this after age 21.  Pneumococcal 13-valent conjugate (PCV13) vaccine. One dose is recommended after age 27.  Pneumococcal polysaccharide (PPSV23) vaccine. One dose is recommended after age 63. Talk to your health care provider about which screenings and vaccines you need and how often you need them. This information is not intended to replace advice given to you by your health care provider. Make sure you discuss any questions you have with your health care provider. Document Released: 09/17/2015 Document Revised: 05/10/2016 Document Reviewed: 06/22/2015 Elsevier Interactive Patient  Education  2017 [employer] Prevention in the Home Falls can cause injuries. They can happen to people of all ages. There are many things you can do to make your home safe and to help prevent falls. What can I do on the outside of my home?  Regularly fix the edges of walkways and driveways and fix any cracks.  Remove anything that might make you trip as you walk through a door, such as a raised step or threshold.  Trim any bushes or trees on the path to your home.  Use bright outdoor lighting.  Clear any walking paths of anything that might make someone trip, such as rocks or tools.  Regularly check to see if handrails are loose or broken. Make sure that both sides of any steps have handrails.  Any raised decks and porches should have guardrails on the edges.  Have any leaves, snow, or ice cleared regularly.  Use sand or salt on walking paths during winter.  Clean up any spills in your garage right away. This includes oil or grease spills. What can I do in the bathroom?  Use night lights.  Install grab bars by the toilet and in the tub and shower. Do not use towel bars as grab bars.  Use non-skid mats or decals in the tub or shower.  If you need to sit down in the shower, use a plastic, non-slip stool.  Keep the floor dry. Clean up any water that spills on the floor as soon as it happens.  Remove soap buildup in the tub or shower regularly.  Attach bath mats securely with double-sided non-slip rug tape.  Do not have throw rugs and other things on the floor that can make you trip. What can I do in the bedroom?  Use night lights.  Make sure that you have a light by your bed that is easy to reach.  Do not use any sheets or blankets that are too big for your bed. They should not hang down onto the floor.  Have a firm chair that has side arms. You can use this for support while you get dressed.  Do not have throw rugs and other things on the floor that can make  you trip. What can I do in the kitchen?  Clean up any spills right away.  Avoid walking on wet floors.  Keep items that you use a lot in easy-to-reach places.  If you need to reach something above you, use a strong step stool that has a grab bar.  Keep electrical cords out of the way.  Do not use floor polish or wax that makes floors slippery. If you must use wax, use non-skid floor wax.  Do not have throw rugs and other things on the floor that can make you trip. What can I do with my stairs?  Do not leave any items on the stairs.  Make sure that there are handrails on both sides of the stairs and use them. Fix handrails that are broken or loose. Make sure that handrails are as long as the stairways.  Check any carpeting to make sure that it is firmly attached to the stairs. Fix any carpet that is loose or worn.  Avoid having throw rugs at the top or bottom of the stairs. If you do have throw rugs, attach them to the floor with carpet tape.  Make sure that you have a light switch at the top of the stairs and the bottom of the stairs. If you do not have them, ask someone to add them for you. What else can I do to help prevent falls?  Wear shoes that:  Do not have high heels.  Have rubber bottoms.  Are comfortable and fit you well.  Are closed at the toe. Do not wear sandals.  If you use a stepladder:  Make sure that it is fully opened. Do not climb a closed stepladder.  Make sure that both sides of the stepladder are locked into place.  Ask someone to hold it for you, if possible.  Clearly mark and make sure that you can see:  Any grab bars or handrails.  First and last steps.  Where the edge of each step is.  Use tools that help you move around (mobility aids) if they are needed. These include:  Canes.  Walkers.  Scooters.  Crutches.  Turn on the lights when you go into a dark area. Replace any light bulbs as soon as they burn out.  Set up your  furniture so you have a clear path. Avoid moving your furniture around.  If any of your floors are uneven, fix them.  If there are any pets around you, be aware of where they are.  Review your medicines with your doctor. Some medicines can make you feel dizzy. This can increase your chance of falling. Ask your doctor what other things that you can  do to help prevent falls. This information is not intended to replace advice given to you by your health care provider. Make sure you discuss any questions you have with your health care provider. Document Released: 06/17/2009 Document Revised: 01/27/2016 Document Reviewed: 09/25/2014 Elsevier Interactive Patient Education  2017 [employer].

## 2019-10-14 NOTE — Patient Instructions (Signed)

## 2019-10-15 LAB — URINE CULTURE
MICRO NUMBER:: 10132656
SPECIMEN QUALITY:: ADEQUATE

## 2019-10-28 ENCOUNTER — Ambulatory Visit: Payer: Medicare Other

## 2019-11-06 ENCOUNTER — Ambulatory Visit: Payer: Medicare Other | Attending: Internal Medicine

## 2019-11-06 DIAGNOSIS — Z23 Encounter for immunization: Secondary | ICD-10-CM | POA: Insufficient documentation

## 2019-11-06 NOTE — Progress Notes (Signed)
   Covid-19 Vaccination Clinic  Name:  Linda Blackburn    MRN: [id number] DOB: 10/16/1950  11/06/2019  [name] was observed post Covid-19 immunization for 15 minutes without incident. She was provided with Vaccine Information Sheet and instruction to access the V-Safe system.   [name] was instructed to call 911 with any severe reactions post vaccine: Marland Kitchen Difficulty breathing  . Swelling of face and throat  . A fast heartbeat  . A bad rash all over body  . Dizziness and weakness   Immunizations Administered    Name Date Dose VIS Date Route   Pfizer COVID-19 Vaccine 11/06/2019 12:12 PM 0.3 mL 08/15/2019 Intramuscular   Manufacturer: [employer]   Lot: [id number]   [place]: [id number]

## 2019-12-02 ENCOUNTER — Telehealth (INDEPENDENT_AMBULATORY_CARE_PROVIDER_SITE_OTHER): Payer: Medicare Other | Admitting: Family Medicine

## 2019-12-02 DIAGNOSIS — R300 Dysuria: Secondary | ICD-10-CM

## 2019-12-02 MED ORDER — CEPHALEXIN 500 MG PO CAPS: 500.0000 mg | ORAL_CAPSULE | Freq: Three times a day (TID) | ORAL | 0 refills | Status: DC

## 2019-12-02 NOTE — Progress Notes (Signed)
This visit type was conducted due to national recommendations for restrictions regarding the COVID-19 pandemic in an effort to limit this patient's exposure and mitigate transmission in our community.   Virtual Visit via Video Note  I connected with Linda Blackburn on 12/02/19 at  2:45 PM EDT by a video enabled telemedicine application and verified that I am speaking with the correct person using two identifiers.  Location patient: home Location provider:work or home office Persons participating in the virtual visit: patient, provider  I discussed the limitations of evaluation and management by telemedicine and the availability of in person appointments. The patient expressed understanding and agreed to proceed.   HPI: Linda Blackburn is seen with concern for possible UTI.  She had very similar symptoms back in February and urine culture came back mixed flora.  She relates several days now of some burning with urination especially early in the mornings and some increased urine frequency.  Denies any nausea or vomiting.  No fever.  No flank pain.  She had some sneezing which she knows may be allergy related.  Occasional postnasal drip symptoms.  She was treated with Keflex over a month ago and symptoms did improve.  No gross hematuria   ROS: See pertinent positives and negatives per HPI.  Past Medical History:  Diagnosis Date  . Abnormal uterine bleeding   . Amenorrhea   . Anxiety   . Depression   . Gallstones   . Hay fever   . HTN (hypertension)   . Pancreatitis   . Urine incontinence   . UTI (urinary tract infection)    reoccuring     Past Surgical History:  Procedure Laterality Date  . ABDOMINAL HYSTERECTOMY  1984   fibroids  . APPENDECTOMY  1984  . BILIARY DILATION  05/29/2019   Procedure: BILIARY DILATION;  Surgeon: Rush Landmark Telford Nab., MD;  Location: [facility];  Service: Gastroenterology;;  . BIOPSY  05/29/2019   Procedure: BIOPSY;  Surgeon: Irving Copas., MD;   Location: [facility];  Service: Gastroenterology;;  . [name]  09/08/1999  . ERCP N/A 05/29/2019   Procedure: ENDOSCOPIC RETROGRADE CHOLANGIOPANCREATOGRAPHY (ERCP);  Surgeon: Irving Copas., MD;  Location: [facility];  Service: Gastroenterology;  Laterality: N/A;  . ERCP W/ SPHINCTEROTOMY AND BALLOON DILATION  08/2008  . ESOPHAGOGASTRODUODENOSCOPY (EGD) WITH PROPOFOL N/A 05/29/2019   Procedure: ESOPHAGOGASTRODUODENOSCOPY (EGD) WITH PROPOFOL;  Surgeon: Rush Landmark Telford Nab., MD;  Location: [facility];  Service: Gastroenterology;  Laterality: N/A;  . OOPHORECTOMY     Only 1 was taken  . POLYPECTOMY  05/29/2019   Procedure: POLYPECTOMY;  Surgeon: Mansouraty, Telford Nab., MD;  Location: [facility];  Service: Gastroenterology;;  . REMOVAL OF STONES  05/29/2019   Procedure: REMOVAL OF STONES;  Surgeon: Irving Copas., MD;  Location: [facility];  Service: Gastroenterology;;  . TONSILLECTOMY    . TUBAL LIGATION      Family History  Problem Relation Age of Onset  . Alcohol abuse Mother   . Prostate cancer Father   . Stroke Maternal Aunt   . Hiatal hernia Maternal Aunt   . Prostate cancer Paternal Grandfather   . Cerebral palsy Granddaughter   . Breast cancer Neg Hx     SOCIAL HX: Non-smoker   Current Outpatient Medications:  .  cephALEXin (KEFLEX) 500 MG capsule, Take 1 capsule (500 mg total) by mouth 3 (three) times daily., Disp: 15 capsule, Rfl: 0 .  fluconazole (DIFLUCAN) 100 MG tablet, Take 1 tablet (100 mg total) by mouth daily., Disp:  12 tablet, Rfl: 0 .  HYDROcodone-acetaminophen (NORCO/VICODIN) 5-325 MG tablet, Take 1 tablet by mouth every 6 (six) hours as needed for moderate pain., Disp: , Rfl:  .  losartan (COZAAR) 100 MG tablet, Take 1 tablet (100 mg total) by mouth daily., Disp: 90 tablet, Rfl: 3 .  naproxen sodium (ALEVE) 220 MG tablet, Take 440 mg by mouth daily as needed (pain)., Disp: , Rfl:  .  ondansetron (ZOFRAN-ODT) 4 MG disintegrating  tablet, Take 1 tablet (4 mg total) by mouth every 8 (eight) hours as needed., Disp: 30 tablet, Rfl: 3 .  oxybutynin (DITROPAN-XL) 10 MG 24 hr tablet, Take 1 tablet (10 mg total) by mouth daily after breakfast., Disp: 90 tablet, Rfl: 3 .  pantoprazole (PROTONIX) 40 MG tablet, Take 1 tablet (40 mg total) by mouth daily at 6 (six) AM., Disp: 90 tablet, Rfl: 3 .  sertraline (ZOLOFT) 100 MG tablet, Take 1 tablet (100 mg total) by mouth daily., Disp: 90 tablet, Rfl: 3 .  zolpidem (AMBIEN) 5 MG tablet, Take 1 tablet (5 mg total) by mouth at bedtime as needed for sleep., Disp: 30 tablet, Rfl: 1  EXAM:  VITALS per patient if applicable:  GENERAL: alert, oriented, appears well and in no acute distress  HEENT: atraumatic, conjunttiva clear, no obvious abnormalities on inspection of external nose and ears  NECK: normal movements of the head and neck  LUNGS: on inspection no signs of respiratory distress, breathing rate appears normal, no obvious gross SOB, gasping or wheezing  CV: no obvious cyanosis  MS: moves all visible extremities without noticeable abnormality  PSYCH/NEURO: pleasant and cooperative, no obvious depression or anxiety, speech and thought processing grossly intact  ASSESSMENT AND PLAN:  Discussed the following assessment and plan:  Dysuria.  Possible uncomplicated cystitis  -We agreed to cover her empirically with Keflex 500 mg 3 times daily for 5 days -Plenty of fluids -If symptoms not fully resolved in the next week office follow-up to further assess    I discussed the assessment and treatment plan with the patient. The patient was provided an opportunity to ask questions and all were answered. The patient agreed with the plan and demonstrated an understanding of the instructions.   The patient was advised to call back or seek an in-person evaluation if the symptoms worsen or if the condition fails to improve as anticipated.   Carolann Littler, MD

## 2019-12-03 ENCOUNTER — Encounter: Payer: Self-pay | Admitting: Gastroenterology

## 2020-01-16 ENCOUNTER — Encounter: Payer: Medicare Other | Admitting: Gastroenterology

## 2020-01-21 ENCOUNTER — Encounter (HOSPITAL_COMMUNITY): Payer: Self-pay

## 2020-01-21 ENCOUNTER — Inpatient Hospital Stay (HOSPITAL_COMMUNITY)
Admission: EM | Admit: 2020-01-21 | Discharge: 2020-01-25 | DRG: 440 | Disposition: A | Discharge status: Home / Self Care | Payer: Medicare Other | Attending: Internal Medicine | Admitting: Internal Medicine

## 2020-01-21 ENCOUNTER — Other Ambulatory Visit: Payer: Self-pay

## 2020-01-21 ENCOUNTER — Emergency Department (HOSPITAL_COMMUNITY): Payer: Medicare Other

## 2020-01-21 DIAGNOSIS — E669 Obesity, unspecified: Secondary | ICD-10-CM | POA: Diagnosis not present

## 2020-01-21 DIAGNOSIS — R001 Bradycardia, unspecified: Secondary | ICD-10-CM | POA: Diagnosis not present

## 2020-01-21 DIAGNOSIS — K5730 Diverticulosis of large intestine without perforation or abscess without bleeding: Secondary | ICD-10-CM | POA: Diagnosis not present

## 2020-01-21 DIAGNOSIS — K2100 Gastro-esophageal reflux disease with esophagitis, without bleeding: Secondary | ICD-10-CM | POA: Diagnosis not present

## 2020-01-21 DIAGNOSIS — Z20822 Contact with and (suspected) exposure to covid-19: Secondary | ICD-10-CM | POA: Diagnosis not present

## 2020-01-21 DIAGNOSIS — Z888 Allergy status to other drugs, medicaments and biological substances status: Secondary | ICD-10-CM | POA: Diagnosis not present

## 2020-01-21 DIAGNOSIS — F329 Major depressive disorder, single episode, unspecified: Secondary | ICD-10-CM | POA: Diagnosis present

## 2020-01-21 DIAGNOSIS — I10 Essential (primary) hypertension: Secondary | ICD-10-CM | POA: Diagnosis not present

## 2020-01-21 DIAGNOSIS — Z87440 Personal history of urinary (tract) infections: Secondary | ICD-10-CM

## 2020-01-21 DIAGNOSIS — E876 Hypokalemia: Secondary | ICD-10-CM | POA: Diagnosis not present

## 2020-01-21 DIAGNOSIS — Z66 Do not resuscitate: Secondary | ICD-10-CM | POA: Diagnosis present

## 2020-01-21 DIAGNOSIS — K859 Acute pancreatitis without necrosis or infection, unspecified: Secondary | ICD-10-CM | POA: Diagnosis not present

## 2020-01-21 DIAGNOSIS — D72829 Elevated white blood cell count, unspecified: Secondary | ICD-10-CM

## 2020-01-21 DIAGNOSIS — Z85828 Personal history of other malignant neoplasm of skin: Secondary | ICD-10-CM | POA: Diagnosis not present

## 2020-01-21 DIAGNOSIS — Z885 Allergy status to narcotic agent status: Secondary | ICD-10-CM

## 2020-01-21 DIAGNOSIS — F4323 Adjustment disorder with mixed anxiety and depressed mood: Secondary | ICD-10-CM | POA: Diagnosis not present

## 2020-01-21 DIAGNOSIS — R1111 Vomiting without nausea: Secondary | ICD-10-CM | POA: Diagnosis not present

## 2020-01-21 DIAGNOSIS — R112 Nausea with vomiting, unspecified: Secondary | ICD-10-CM | POA: Diagnosis not present

## 2020-01-21 DIAGNOSIS — R5383 Other fatigue: Secondary | ICD-10-CM | POA: Diagnosis not present

## 2020-01-21 DIAGNOSIS — Z79899 Other long term (current) drug therapy: Secondary | ICD-10-CM

## 2020-01-21 DIAGNOSIS — K219 Gastro-esophageal reflux disease without esophagitis: Secondary | ICD-10-CM | POA: Diagnosis present

## 2020-01-21 DIAGNOSIS — K8500 Idiopathic acute pancreatitis without necrosis or infection: Principal | ICD-10-CM | POA: Diagnosis present

## 2020-01-21 DIAGNOSIS — Z6837 Body mass index (BMI) 37.0-37.9, adult: Secondary | ICD-10-CM | POA: Diagnosis not present

## 2020-01-21 DIAGNOSIS — R1084 Generalized abdominal pain: Secondary | ICD-10-CM | POA: Diagnosis not present

## 2020-01-21 LAB — URINALYSIS, ROUTINE W REFLEX MICROSCOPIC
Bilirubin Urine: NEGATIVE
Glucose, UA: NEGATIVE mg/dL
Hgb urine dipstick: NEGATIVE
Ketones, ur: NEGATIVE mg/dL
Leukocytes,Ua: NEGATIVE
Nitrite: NEGATIVE
Protein, ur: NEGATIVE mg/dL
Specific Gravity, Urine: 1.039 — ABNORMAL HIGH (ref 1.005–1.030)
pH: 7 (ref 5.0–8.0)

## 2020-01-21 LAB — COMPREHENSIVE METABOLIC PANEL
ALT: 25 U/L (ref 0–44)
AST: 28 U/L (ref 15–41)
Albumin: 4.1 g/dL (ref 3.5–5.0)
Alkaline Phosphatase: 96 U/L (ref 38–126)
Anion gap: 8 (ref 5–15)
BUN: 16 mg/dL (ref 8–23)
CO2: 24 mmol/L (ref 22–32)
Calcium: 8.9 mg/dL (ref 8.9–10.3)
Chloride: 107 mmol/L (ref 98–111)
Creatinine, Ser: 0.6 mg/dL (ref 0.44–1.00)
GFR calc Af Amer: >60 mL/min (ref >60)
GFR calc non Af Amer: >60 mL/min (ref >60)
Glucose, Bld: 109 mg/dL — ABNORMAL HIGH (ref 70–99)
Potassium: 4.3 mmol/L (ref 3.5–5.1)
Sodium: 139 mmol/L (ref 135–145)
Total Bilirubin: 1 mg/dL (ref 0.3–1.2)
Total Protein: 7.2 g/dL (ref 6.5–8.1)

## 2020-01-21 LAB — CBC WITH DIFFERENTIAL/PLATELET
Abs Immature Granulocytes: 0.06 10*3/uL (ref 0.00–0.07)
Basophils Absolute: 0.1 10*3/uL (ref 0.0–0.1)
Basophils Relative: 1 %
Eosinophils Absolute: 0 10*3/uL (ref 0.0–0.5)
Eosinophils Relative: 0 %
HCT: 48.8 % — ABNORMAL HIGH (ref 36.0–46.0)
Hemoglobin: 15.8 g/dL — ABNORMAL HIGH (ref 12.0–15.0)
Immature Granulocytes: 1 %
Lymphocytes Relative: 14 %
Lymphs Abs: 1.6 10*3/uL (ref 0.7–4.0)
MCH: 29.5 pg (ref 26.0–34.0)
MCHC: 32.4 g/dL (ref 30.0–36.0)
MCV: 91 fL (ref 80.0–100.0)
Monocytes Absolute: 0.5 10*3/uL (ref 0.1–1.0)
Monocytes Relative: 5 %
Neutro Abs: 9 10*3/uL — ABNORMAL HIGH (ref 1.7–7.7)
Neutrophils Relative %: 79 %
Platelets: 231 10*3/uL (ref 150–400)
RBC: 5.36 MIL/uL — ABNORMAL HIGH (ref 3.87–5.11)
RDW: 13.2 % (ref 11.5–15.5)
WBC: 11.3 10*3/uL — ABNORMAL HIGH (ref 4.0–10.5)
nRBC: 0 % (ref 0.0–0.2)

## 2020-01-21 LAB — SARS CORONAVIRUS 2 BY RT PCR (HOSPITAL ORDER, PERFORMED IN ~~LOC~~ HOSPITAL LAB): SARS Coronavirus 2: NEGATIVE

## 2020-01-21 LAB — LIPASE, BLOOD: Lipase: 3350 U/L — ABNORMAL HIGH (ref 11–51)

## 2020-01-21 MED ORDER — FENTANYL CITRATE (PF) 100 MCG/2ML IJ SOLN
50.0000 ug | INTRAMUSCULAR | Status: DC | PRN
  Administered 2020-01-21 – 2020-01-24 (×18): 100 ug via INTRAVENOUS
  Administered 2020-01-24: 50 ug via INTRAVENOUS
  Administered 2020-01-24 (×3): 100 ug via INTRAVENOUS
  Filled 2020-01-21 (×22): qty 2

## 2020-01-21 MED ORDER — SODIUM CHLORIDE (PF) 0.9 % IJ SOLN
INTRAMUSCULAR | Status: AC
  Filled 2020-01-21: qty 50

## 2020-01-21 MED ORDER — FENTANYL CITRATE (PF) 100 MCG/2ML IJ SOLN
100.0000 ug | Freq: Once | INTRAMUSCULAR | Status: AC
  Administered 2020-01-21: 100 ug via INTRAVENOUS
  Filled 2020-01-21: qty 2

## 2020-01-21 MED ORDER — LOSARTAN POTASSIUM 50 MG PO TABS
100.0000 mg | ORAL_TABLET | Freq: Every day | ORAL | Status: DC
  Administered 2020-01-21 – 2020-01-25 (×5): 100 mg via ORAL
  Filled 2020-01-21 (×6): qty 2

## 2020-01-21 MED ORDER — ONDANSETRON HCL 4 MG/2ML IJ SOLN
4.0000 mg | Freq: Once | INTRAMUSCULAR | Status: AC
  Administered 2020-01-21: 4 mg via INTRAVENOUS
  Filled 2020-01-21: qty 2

## 2020-01-21 MED ORDER — ONDANSETRON HCL 4 MG/2ML IJ SOLN
4.0000 mg | Freq: Three times a day (TID) | INTRAMUSCULAR | Status: DC | PRN
  Administered 2020-01-21 – 2020-01-22 (×3): 4 mg via INTRAVENOUS
  Filled 2020-01-21 (×3): qty 2

## 2020-01-21 MED ORDER — PROCHLORPERAZINE EDISYLATE 10 MG/2ML IJ SOLN
10.0000 mg | INTRAMUSCULAR | Status: DC | PRN
  Administered 2020-01-22 – 2020-01-23 (×4): 10 mg via INTRAVENOUS
  Filled 2020-01-21 (×4): qty 2

## 2020-01-21 MED ORDER — DOCUSATE SODIUM 100 MG PO CAPS
100.0000 mg | ORAL_CAPSULE | Freq: Two times a day (BID) | ORAL | Status: DC
  Administered 2020-01-22 – 2020-01-24 (×5): 100 mg via ORAL
  Filled 2020-01-21 (×6): qty 1

## 2020-01-21 MED ORDER — SERTRALINE HCL 100 MG PO TABS
100.0000 mg | ORAL_TABLET | Freq: Every day | ORAL | Status: DC
  Administered 2020-01-21 – 2020-01-25 (×5): 100 mg via ORAL
  Filled 2020-01-21 (×5): qty 1

## 2020-01-21 MED ORDER — FENTANYL CITRATE (PF) 100 MCG/2ML IJ SOLN: 12.5000 ug | INTRAMUSCULAR | Status: DC | PRN

## 2020-01-21 MED ORDER — IOHEXOL 300 MG/ML  SOLN
100.0000 mL | Freq: Once | INTRAMUSCULAR | Status: AC | PRN
  Administered 2020-01-21: 100 mL via INTRAVENOUS

## 2020-01-21 MED ORDER — OXYBUTYNIN CHLORIDE ER 5 MG PO TB24
10.0000 mg | ORAL_TABLET | Freq: Every day | ORAL | Status: DC
  Administered 2020-01-22 – 2020-01-25 (×4): 10 mg via ORAL
  Filled 2020-01-21 (×4): qty 2

## 2020-01-21 MED ORDER — ACETAMINOPHEN 650 MG RE SUPP: 650.0000 mg | Freq: Four times a day (QID) | RECTAL | Status: DC | PRN

## 2020-01-21 MED ORDER — SODIUM CHLORIDE 0.9 % IV BOLUS
1000.0000 mL | Freq: Once | INTRAVENOUS | Status: AC
  Administered 2020-01-21: 1000 mL via INTRAVENOUS

## 2020-01-21 MED ORDER — ALBUTEROL SULFATE (2.5 MG/3ML) 0.083% IN NEBU: 2.5000 mg | INHALATION_SOLUTION | RESPIRATORY_TRACT | Status: DC | PRN

## 2020-01-21 MED ORDER — PANTOPRAZOLE SODIUM 40 MG PO TBEC
40.0000 mg | DELAYED_RELEASE_TABLET | Freq: Every day | ORAL | Status: DC
  Administered 2020-01-22 – 2020-01-25 (×4): 40 mg via ORAL
  Filled 2020-01-21 (×4): qty 1

## 2020-01-21 MED ORDER — ACETAMINOPHEN 325 MG PO TABS
650.0000 mg | ORAL_TABLET | Freq: Four times a day (QID) | ORAL | Status: DC | PRN
  Administered 2020-01-23 – 2020-01-25 (×4): 650 mg via ORAL
  Filled 2020-01-21 (×4): qty 2

## 2020-01-21 MED ORDER — LACTATED RINGERS IV SOLN: INTRAVENOUS | Status: DC

## 2020-01-21 MED ORDER — ENOXAPARIN SODIUM 40 MG/0.4ML ~~LOC~~ SOLN
40.0000 mg | SUBCUTANEOUS | Status: DC
  Administered 2020-01-21 – 2020-01-24 (×4): 40 mg via SUBCUTANEOUS
  Filled 2020-01-21 (×4): qty 0.4

## 2020-01-21 MED ORDER — ZOLPIDEM TARTRATE 5 MG PO TABS: 5.0000 mg | ORAL_TABLET | Freq: Every evening | ORAL | Status: DC | PRN

## 2020-01-21 NOTE — ED Notes (Signed)
Patient transported to CT 

## 2020-01-21 NOTE — ED Notes (Addendum)
Report called  

## 2020-01-21 NOTE — ED Triage Notes (Signed)
Pt BIBA from home.   Per EMS- Pt c/o abd pain, n/v starting 0900 today.  Pt reports this feels like previous pancreatitis flare ups.  AOx4,   4 mg zofran

## 2020-01-21 NOTE — ED Notes (Signed)
Pt reports worsening abdominal pain, EDP Schlossman made aware.

## 2020-01-21 NOTE — ED Provider Notes (Signed)
[facility] DEPT Provider Note   CSN: [id number] Arrival date & time: 01/21/20  1222     History Chief Complaint  Patient presents with  . Abdominal Pain  . Nausea    Linda Blackburn is a 69 y.o. female.  HPI      Hx of chronic pancreatitis, more frequent episodes of acute pancreatitis Dr. Ardis Hughs GI, has been seen also at [facility] in [place] Here 9/21 for same Went to [facility], came home Friday evening and hasn't felt great for the last 2 days Woke up this AM at 9 and noted symptoms starting, waited to see if it was manageable at home but it was getting worse and came in Gradually getting worse Little lower epigastric pain, no radiation to back yet, radiates to umbilicus Constant pushing pain, severe 10/10, makes it difficult to breath Worse laying flat, worse sitting up all th way No diarrhea, did have loose BM Vomited at home and ambulance No fevers, but did feel clammy, lightheaded Similar to prior episodes of pancreatitis   Past Medical History:  Diagnosis Date  . Abnormal uterine bleeding   . Amenorrhea   . Anxiety   . Depression   . Gallstones   . Hay fever   . HTN (hypertension)   . Pancreatitis   . Urine incontinence   . UTI (urinary tract infection)    reoccuring     Patient Active Problem List   Diagnosis Date Noted  . Acute pancreatitis 01/21/2020  . Gastroesophageal reflux disease   . Basal cell carcinoma (BCC) in situ of skin 10/11/2018  . Stress incontinence 10/11/2018  . Hypotension (arterial) 08/09/2018  . Sinus bradycardia on ECG 08/09/2018  . AKI (acute kidney injury) ([place]) 08/09/2018  . Recurrent acute pancreatitis 08/08/2018  . Nausea & vomiting 03/07/2018  . Sepsis ([place]) 01/01/2018  . Alternating constipation and diarrhea 09/26/2016  . Obesity (BMI 30-39.9) 07/20/2014  . Chronic insomnia 06/16/2014  . Hypokalemia 06/10/2014  . Volume overload 06/10/2014  . UTI (urinary tract infection) 06/10/2014    . Leukocytosis 06/10/2014  . Acute respiratory failure with hypoxia ([place]) 06/10/2014  . Acute recurrent pancreatitis 06/06/2014  . Pancreatitis 06/06/2014  . Hypertension 09/11/2012  . Adjustment disorder with mixed anxiety and depressed mood 09/11/2012  . Pancreas divisum 01/18/2011  . URI 09/02/2010  . Depression, recurrent ([place]) 04/01/2010  . PANCREATITIS, HX OF 04/01/2010    Past Surgical History:  Procedure Laterality Date  . ABDOMINAL HYSTERECTOMY  1984   fibroids  . APPENDECTOMY  1984  . BILIARY DILATION  05/29/2019   Procedure: BILIARY DILATION;  Surgeon: Rush Landmark Telford Nab., MD;  Location: [facility];  Service: Gastroenterology;;  . BIOPSY  05/29/2019   Procedure: BIOPSY;  Surgeon: Irving Copas., MD;  Location: [facility];  Service: Gastroenterology;;  . [name]  09/08/1999  . ERCP N/A 05/29/2019   Procedure: ENDOSCOPIC RETROGRADE CHOLANGIOPANCREATOGRAPHY (ERCP);  Surgeon: Irving Copas., MD;  Location: [facility];  Service: Gastroenterology;  Laterality: N/A;  . ERCP W/ SPHINCTEROTOMY AND BALLOON DILATION  08/2008  . ESOPHAGOGASTRODUODENOSCOPY (EGD) WITH PROPOFOL N/A 05/29/2019   Procedure: ESOPHAGOGASTRODUODENOSCOPY (EGD) WITH PROPOFOL;  Surgeon: Rush Landmark Telford Nab., MD;  Location: [facility];  Service: Gastroenterology;  Laterality: N/A;  . OOPHORECTOMY     Only 1 was taken  . POLYPECTOMY  05/29/2019   Procedure: POLYPECTOMY;  Surgeon: Mansouraty, Telford Nab., MD;  Location: [facility];  Service: Gastroenterology;;  . REMOVAL OF STONES  05/29/2019   Procedure: REMOVAL  OF STONES;  Surgeon: Mansouraty, Telford Nab., MD;  Location: [facility];  Service: Gastroenterology;;  . TONSILLECTOMY    . TUBAL LIGATION       OB History    Gravida  4   Para  2   Term  2   Preterm      AB  2   Living  2     SAB      TAB      Ectopic      Multiple      Live Births  2           Family History  Problem Relation Age of  Onset  . Alcohol abuse Mother   . Prostate cancer Father   . Stroke Maternal Aunt   . Hiatal hernia Maternal Aunt   . Prostate cancer Paternal Grandfather   . Cerebral palsy Granddaughter   . Breast cancer Neg Hx     Social History   Tobacco Use  . Smoking status: Never Smoker  . Smokeless tobacco: Never Used  Substance Use Topics  . Alcohol use: Yes    Alcohol/week: 0.0 standard drinks    Comment: occ - hardly every   . Drug use: No    Home Medications Prior to Admission medications   Medication Sig Start Date End Date Taking? Authorizing Provider  ibuprofen (ADVIL) 200 MG tablet Take 400 mg by mouth every 6 (six) hours as needed for headache or moderate pain.   Yes [provider]  losartan (COZAAR) 100 MG tablet Take 1 tablet (100 mg total) by mouth daily. 08/18/19  Yes Burchette, Alinda Sierras, MD  ondansetron (ZOFRAN-ODT) 4 MG disintegrating tablet Take 1 tablet (4 mg total) by mouth every 8 (eight) hours as needed. Patient taking differently: Take 4 mg by mouth every 8 (eight) hours as needed for nausea or vomiting.  08/18/19  Yes Burchette, Alinda Sierras, MD  oxybutynin (DITROPAN-XL) 10 MG 24 hr tablet Take 1 tablet (10 mg total) by mouth daily after breakfast. 06/06/19  Yes Burchette, Alinda Sierras, MD  pantoprazole (PROTONIX) 40 MG tablet Take 1 tablet (40 mg total) by mouth daily at 6 (six) AM. 06/06/19  Yes Burchette, Alinda Sierras, MD  sertraline (ZOLOFT) 100 MG tablet Take 1 tablet (100 mg total) by mouth daily. 08/18/19  Yes Burchette, Alinda Sierras, MD  zolpidem (AMBIEN) 5 MG tablet Take 1 tablet (5 mg total) by mouth at bedtime as needed for sleep. 08/07/16  Yes Burchette, Alinda Sierras, MD  cephALEXin (KEFLEX) 500 MG capsule Take 1 capsule (500 mg total) by mouth 3 (three) times daily. Patient not taking: Reported on 01/21/2020 12/02/19   [name], MD  fluconazole (DIFLUCAN) 100 MG tablet Take 1 tablet (100 mg total) by mouth daily. Patient not taking: Reported on 01/21/2020 10/14/19    [name], MD    Allergies    Other, Buprenorphine hcl, Codeine, Dilaudid [hydromorphone hcl], and Morphine and related  Review of Systems   Review of Systems  Constitutional: Positive for fatigue. Negative for fever.  HENT: Negative for sore throat.   Eyes: Negative for visual disturbance.  Respiratory: Negative for cough and shortness of breath.   Cardiovascular: Negative for chest pain.  Gastrointestinal: Positive for abdominal pain, nausea and vomiting. Negative for constipation and diarrhea.  Genitourinary: Negative for difficulty urinating and dysuria. Frequency: chronic.  Musculoskeletal: Negative for back pain and neck pain.  Skin: Negative for rash.  Neurological: Negative for syncope and headaches.  Physical Exam Updated Vital Signs BP 136/70   Pulse (!) 51   Temp 97.7 F (36.5 C) (Oral)   Resp 18   Ht 5\' 1"  (1.549 m)   Wt 90.7 kg   SpO2 97%   BMI 37.79 kg/m   Physical Exam Vitals and nursing note reviewed.  Constitutional:      General: She is not in acute distress.    Appearance: She is well-developed. She is not diaphoretic.  HENT:     Head: Normocephalic and atraumatic.  Eyes:     Conjunctiva/sclera: Conjunctivae normal.  Cardiovascular:     Rate and Rhythm: Normal rate and regular rhythm.  Pulmonary:     Effort: Pulmonary effort is normal. No respiratory distress.  Abdominal:     General: There is no distension.     Palpations: Abdomen is soft.     Tenderness: There is abdominal tenderness in the right upper quadrant, epigastric area and left upper quadrant. There is no guarding.  Musculoskeletal:        General: No tenderness.     Cervical back: Normal range of motion.  Skin:    General: Skin is warm and dry.     Findings: No erythema or rash.  Neurological:     Mental Status: She is alert and oriented to person, place, and time.     ED Results / Procedures / Treatments   Labs (all labs ordered are listed, but only abnormal  results are displayed) Labs Reviewed  COMPREHENSIVE METABOLIC PANEL - Abnormal; Notable for the following components:      Result Value   Glucose, Bld 109 (*)    All other components within normal limits  LIPASE, BLOOD - Abnormal; Notable for the following components:   Lipase 3,350 (*)    All other components within normal limits  URINALYSIS, ROUTINE W REFLEX MICROSCOPIC - Abnormal; Notable for the following components:   Color, Urine STRAW (*)    Specific Gravity, Urine 1.039 (*)    All other components within normal limits  CBC WITH DIFFERENTIAL/PLATELET - Abnormal; Notable for the following components:   WBC 11.3 (*)    RBC 5.36 (*)    Hemoglobin 15.8 (*)    HCT 48.8 (*)    Neutro Abs 9.0 (*)    All other components within normal limits  SARS CORONAVIRUS 2 BY RT PCR (HOSPITAL ORDER, [facility])  CBC WITH DIFFERENTIAL/PLATELET  CBC  CREATININE, SERUM  BASIC METABOLIC PANEL  CBC  LIPASE, BLOOD    EKG EKG Interpretation  Date/Time:  Wednesday Jan 21 2020 13:00:59 EDT Ventricular Rate:  60 PR Interval:    QRS Duration: 107 QT Interval:  425 QTC Calculation: 425 R Axis:   -3 Text Interpretation: Sinus rhythm No significant change since last tracing Confirmed by Gareth Morgan [phone]) on 01/21/2020 1:08:09 PM   Radiology CT ABDOMEN PELVIS W CONTRAST  Result Date: 01/21/2020 CLINICAL DATA:  Mid abdominal pain. EXAM: CT ABDOMEN AND PELVIS WITH CONTRAST TECHNIQUE: Multidetector CT imaging of the abdomen and pelvis was performed using the standard protocol following bolus administration of intravenous contrast. CONTRAST:  187mL OMNIPAQUE IOHEXOL 300 MG/ML  SOLN COMPARISON:  May 27, 2019 FINDINGS: Lower chest: Very mild atelectasis is seen within the bilateral lung bases. Hepatobiliary: No focal liver abnormality is seen. Mild pneumobilia is noted. Status post cholecystectomy. No biliary dilatation. Pancreas: There is mild peripancreatic  inflammatory fat stranding. Spleen: Normal in size without focal abnormality. Adrenals/Urinary Tract: Adrenal glands  are unremarkable. Kidneys are normal, without renal calculi, focal lesion, or hydronephrosis. Bladder is unremarkable. Stomach/Bowel: Stomach is within normal limits. The appendix is not clearly identified. No evidence of bowel wall thickening, distention, or inflammatory changes. Noninflamed diverticula are seen throughout the large bowel. Vascular/Lymphatic: No significant vascular findings are present. No enlarged abdominal or pelvic lymph nodes. Reproductive: Status post hysterectomy. No adnexal masses. Other: No abdominal wall hernia or abnormality. No abdominopelvic ascites. Musculoskeletal: No acute or significant osseous findings. IMPRESSION: 1. Findings consistent with mild acute pancreatitis. 2. Colonic diverticulosis. 3. Evidence of prior cholecystectomy with mild subsequent pneumobilia. Electronically Signed   By: Virgina Norfolk M.D.   On: 01/21/2020 15:24    Procedures Procedures (including critical care time)  Medications Ordered in ED Medications  sodium chloride (PF) 0.9 % injection (has no administration in time range)  losartan (COZAAR) tablet 100 mg (has no administration in time range)  sertraline (ZOLOFT) tablet 100 mg (has no administration in time range)  zolpidem (AMBIEN) tablet 5 mg (has no administration in time range)  pantoprazole (PROTONIX) EC tablet 40 mg (has no administration in time range)  oxybutynin (DITROPAN-XL) 24 hr tablet 10 mg (has no administration in time range)  enoxaparin (LOVENOX) injection 40 mg (has no administration in time range)  lactated ringers infusion (has no administration in time range)  acetaminophen (TYLENOL) tablet 650 mg (has no administration in time range)    Or  acetaminophen (TYLENOL) suppository 650 mg (has no administration in time range)  docusate sodium (COLACE) capsule 100 mg (has no administration in time  range)  albuterol (PROVENTIL) (2.5 MG/3ML) 0.083% nebulizer solution 2.5 mg (has no administration in time range)  fentaNYL (SUBLIMAZE) injection 50-100 mcg (100 mcg Intravenous Given 01/21/20 2004)  sodium chloride 0.9 % bolus 1,000 mL (0 mLs Intravenous Stopped 01/21/20 1506)  fentaNYL (SUBLIMAZE) injection 100 mcg (100 mcg Intravenous Given 01/21/20 1345)  ondansetron (ZOFRAN) injection 4 mg (4 mg Intravenous Given 01/21/20 1346)  fentaNYL (SUBLIMAZE) injection 100 mcg (100 mcg Intravenous Given 01/21/20 1502)  iohexol (OMNIPAQUE) 300 MG/ML solution 100 mL (100 mLs Intravenous Contrast Given 01/21/20 1450)  fentaNYL (SUBLIMAZE) injection 100 mcg (100 mcg Intravenous Given 01/21/20 1645)    ED Course  I have reviewed the triage vital signs and the nursing notes.  Pertinent labs & imaging results that were available during my care of the patient were reviewed by me and considered in my medical decision making (see chart for details).    MDM Rules/Calculators/A&P                       69 year old female with a history of recurrent idiopathic pancreatitis, with pancreatitis most recently at the end of September related to common bile duct stone, who presents with concern for abdominal pain, nausea and vomiting consistent with her prior episodes of pancreatitis.  CMP shows normal transaminases and bilirubin.  Lipase is elevated to 3350, consistent with pancreatitis.  CT abdomen pelvis performed which shows acute pancreatitis.  We will admit to the hospitalist for further care.  Given IV fluids, zofran, fentanyl.  Final Clinical Impression(s) / ED Diagnoses Final diagnoses:  Idiopathic acute pancreatitis without infection or necrosis    Rx / DC Orders ED Discharge Orders    None       Gareth Morgan, MD 01/21/20 2102

## 2020-01-21 NOTE — ED Notes (Signed)
Pt placed on Purewick 

## 2020-01-21 NOTE — H&P (Addendum)
History and Physical    DOA: 01/21/2020  PCP: [name], MD  Primary GI -Dr Owens Loffler Patient coming from: home   Chief Complaint: Abdominal pain since this morning  HPI: Linda Blackburn is a 69 y.o. female with history h/o anxiety, depression, hypertension, cholelithiasis s/p cholecystectomy, recurrent acute pancreatitis of unknown etiology (patient has had extensive work-up at [facility] and other tertiary centers with negative work-up) presents with complaints of abdominal pain that started this morning around 9 AM.  Patient denies any precipitating factors.  She states she had typical symptoms associated with prior pancreatitis flares-epigastric/mid abdominal pain 10/10, radiating to the back associated with nausea.  Denies any diarrhea or vomiting. ED work-up: Afebrile, heart rate 50-70, blood pressure systolic 1 [id number] 30, O2 sat 97 to 100% on room air.  WBC 11.3, hemoglobin 15.8, hematocrit 48.8, sodium 139, potassium 4.3, chloride 107, BUN 16, creatinine 0.6, calcium 8.9, LFTs within normal limits, T bili 1.0, lipase 3350.  CT abdomen with mild pancreatitis, prior cholecystectomy and residual pneumobilia.  Patient received IV fentanyl 100 mcg x 2 doses in the ED and rates her abdominal pain around 6/10.  Patient is requested to be admitted for further evaluation and management.  Review of Systems: As per HPI otherwise 10 point review of systems negative.    Past Medical History:  Diagnosis Date  . Abnormal uterine bleeding   . Amenorrhea   . Anxiety   . Depression   . Gallstones   . Hay fever   . HTN (hypertension)   . Pancreatitis   . Urine incontinence   . UTI (urinary tract infection)    reoccuring     Past Surgical History:  Procedure Laterality Date  . ABDOMINAL HYSTERECTOMY  1984   fibroids  . APPENDECTOMY  1984  . BILIARY DILATION  05/29/2019   Procedure: BILIARY DILATION;  Surgeon: Rush Landmark Telford Nab., MD;  Location: [facility];  Service:  Gastroenterology;;  . BIOPSY  05/29/2019   Procedure: BIOPSY;  Surgeon: Irving Copas., MD;  Location: [facility];  Service: Gastroenterology;;  . [name]  09/08/1999  . ERCP N/A 05/29/2019   Procedure: ENDOSCOPIC RETROGRADE CHOLANGIOPANCREATOGRAPHY (ERCP);  Surgeon: Irving Copas., MD;  Location: [facility];  Service: Gastroenterology;  Laterality: N/A;  . ERCP W/ SPHINCTEROTOMY AND BALLOON DILATION  08/2008  . ESOPHAGOGASTRODUODENOSCOPY (EGD) WITH PROPOFOL N/A 05/29/2019   Procedure: ESOPHAGOGASTRODUODENOSCOPY (EGD) WITH PROPOFOL;  Surgeon: Rush Landmark Telford Nab., MD;  Location: [facility];  Service: Gastroenterology;  Laterality: N/A;  . OOPHORECTOMY     Only 1 was taken  . POLYPECTOMY  05/29/2019   Procedure: POLYPECTOMY;  Surgeon: Mansouraty, Telford Nab., MD;  Location: [facility];  Service: Gastroenterology;;  . REMOVAL OF STONES  05/29/2019   Procedure: REMOVAL OF STONES;  Surgeon: Irving Copas., MD;  Location: [facility];  Service: Gastroenterology;;  . TONSILLECTOMY    . TUBAL LIGATION      Social history:  reports that she has never smoked. She has never used smokeless tobacco. She reports current alcohol use. She reports that she does not use drugs.   Allergies  Allergen Reactions  . Other Hives and Swelling    AQUASONIC [place] GEL  . Buprenorphine Hcl     "In a scarry movie" weird thoughts  . Codeine     GI upset  . Dilaudid [Hydromorphone Hcl] Itching  . Morphine And Related     "In a scarry movie" weird thoughts    Family History  Problem  Relation Age of Onset  . Alcohol abuse Mother   . Prostate cancer Father   . Stroke Maternal Aunt   . Hiatal hernia Maternal Aunt   . Prostate cancer Paternal Grandfather   . Cerebral palsy Granddaughter   . Breast cancer Neg Hx       Prior to Admission medications   Medication Sig Start Date End Date Taking? Authorizing Provider  ibuprofen (ADVIL) 200 MG tablet Take 400 mg by mouth  every 6 (six) hours as needed for headache or moderate pain.   Yes [provider]  losartan (COZAAR) 100 MG tablet Take 1 tablet (100 mg total) by mouth daily. 08/18/19  Yes Burchette, Alinda Sierras, MD  ondansetron (ZOFRAN-ODT) 4 MG disintegrating tablet Take 1 tablet (4 mg total) by mouth every 8 (eight) hours as needed. Patient taking differently: Take 4 mg by mouth every 8 (eight) hours as needed for nausea or vomiting.  08/18/19  Yes Burchette, Alinda Sierras, MD  oxybutynin (DITROPAN-XL) 10 MG 24 hr tablet Take 1 tablet (10 mg total) by mouth daily after breakfast. 06/06/19  Yes Burchette, Alinda Sierras, MD  pantoprazole (PROTONIX) 40 MG tablet Take 1 tablet (40 mg total) by mouth daily at 6 (six) AM. 06/06/19  Yes Burchette, Alinda Sierras, MD  sertraline (ZOLOFT) 100 MG tablet Take 1 tablet (100 mg total) by mouth daily. 08/18/19  Yes Burchette, Alinda Sierras, MD  zolpidem (AMBIEN) 5 MG tablet Take 1 tablet (5 mg total) by mouth at bedtime as needed for sleep. 08/07/16  Yes Burchette, Alinda Sierras, MD  cephALEXin (KEFLEX) 500 MG capsule Take 1 capsule (500 mg total) by mouth 3 (three) times daily. Patient not taking: Reported on 01/21/2020 12/02/19   [name], MD  fluconazole (DIFLUCAN) 100 MG tablet Take 1 tablet (100 mg total) by mouth daily. Patient not taking: Reported on 01/21/2020 10/14/19   [name], MD    Physical Exam: Vitals:   01/21/20 1430 01/21/20 1530 01/21/20 1700 01/21/20 1910  BP: (!) 161/79 (!) 141/82 124/61 136/70  Pulse: 60 62 (!) 57 (!) 51  Resp: 15 18 14 18   Temp:      TempSrc:      SpO2: 98% 99% 93% 97%  Weight:      Height:        Constitutional: NAD, calm, comfortable Eyes: PERRL, lids and conjunctivae normal ENMT: Mucous membranes are moist. Posterior pharynx clear of any exudate or lesions.Normal dentition.  Neck: normal, supple, no masses, no thyromegaly Respiratory: clear to auscultation bilaterally, no wheezing, no crackles. Normal respiratory effort. No  accessory muscle use.  Cardiovascular: Regular rate and rhythm, no murmurs / rubs / gallops. No extremity edema. 2+ pedal pulses. No carotid bruits.  Abdomen: Moderate diffuse tenderness, mostly in epigastrium and mid abdomen/periumbilical, nondistended no masses palpated. No hepatosplenomegaly. Bowel sounds positive.  Musculoskeletal: no clubbing / cyanosis. No joint deformity upper and lower extremities. Good ROM, no contractures. Normal muscle tone.  Neurologic: CN 2-12 grossly intact. Sensation intact, DTR normal. Strength 5/5 in all 4.  Psychiatric: Normal judgment and insight. Alert and oriented x 3. Normal mood.  SKIN/catheters: no rashes, lesions, ulcers. No induration  Labs on Admission: I have personally reviewed following labs and imaging studies  CBC: Recent Labs  Lab 01/21/20 1340  WBC 11.3*  NEUTROABS 9.0*  HGB 15.8*  HCT 48.8*  MCV 91.0  PLT [id number]   Basic Metabolic Panel: Recent Labs  Lab 01/21/20 1320  NA 139  K 4.3  CL 107  CO2 24  GLUCOSE 109*  BUN 16  CREATININE 0.60  CALCIUM 8.9   GFR: Estimated Creatinine Clearance: 66.2 mL/min (by C-G formula based on SCr of 0.6 mg/dL). Recent Labs  Lab 01/21/20 1340  WBC 11.3*   Liver Function Tests: Recent Labs  Lab 01/21/20 1320  AST 28  ALT 25  ALKPHOS 96  BILITOT 1.0  PROT 7.2  ALBUMIN 4.1   Recent Labs  Lab 01/21/20 1320  LIPASE 3,350*   No results for input(s): AMMONIA in the last 168 hours. Coagulation Profile: No results for input(s): INR, PROTIME in the last 168 hours. Cardiac Enzymes: No results for input(s): CKTOTAL, CKMB, CKMBINDEX, TROPONINI in the last 168 hours. BNP (last 3 results) No results for input(s): PROBNP in the last 8760 hours. HbA1C: No results for input(s): HGBA1C in the last 72 hours. CBG: No results for input(s): GLUCAP in the last 168 hours. Lipid Profile: No results for input(s): CHOL, HDL, LDLCALC, TRIG, CHOLHDL, LDLDIRECT in the last 72 hours. Thyroid Function  Tests: No results for input(s): TSH, T4TOTAL, FREET4, T3FREE, THYROIDAB in the last 72 hours. Anemia Panel: No results for input(s): VITAMINB12, FOLATE, FERRITIN, TIBC, IRON, RETICCTPCT in the last 72 hours. Urine analysis:    Component Value Date/Time   COLORURINE AMBER (A) 05/27/2019 1600   APPEARANCEUR CLEAR 05/27/2019 1600   LABSPEC 1.028 05/27/2019 1600   PHURINE 5.0 05/27/2019 1600   GLUCOSEU NEGATIVE 05/27/2019 1600   HGBUR SMALL (A) 05/27/2019 1600   BILIRUBINUR Negative 10/14/2019 1529   KETONESUR NEGATIVE 05/27/2019 1600   PROTEINUR Positive (A) 10/14/2019 1529   PROTEINUR NEGATIVE 05/27/2019 1600   UROBILINOGEN 0.2 10/14/2019 1529   UROBILINOGEN 0.2 06/07/2014 0005   NITRITE Negative 10/14/2019 1529   NITRITE NEGATIVE 05/27/2019 1600   LEUKOCYTESUR Moderate (2+) (A) 10/14/2019 1529   LEUKOCYTESUR NEGATIVE 05/27/2019 1600    Radiological Exams on Admission: Personally reviewed  CT ABDOMEN PELVIS W CONTRAST  Result Date: 01/21/2020 CLINICAL DATA:  Mid abdominal pain. EXAM: CT ABDOMEN AND PELVIS WITH CONTRAST TECHNIQUE: Multidetector CT imaging of the abdomen and pelvis was performed using the standard protocol following bolus administration of intravenous contrast. CONTRAST:  182mL OMNIPAQUE IOHEXOL 300 MG/ML  SOLN COMPARISON:  May 27, 2019 FINDINGS: Lower chest: Very mild atelectasis is seen within the bilateral lung bases. Hepatobiliary: No focal liver abnormality is seen. Mild pneumobilia is noted. Status post cholecystectomy. No biliary dilatation. Pancreas: There is mild peripancreatic inflammatory fat stranding. Spleen: Normal in size without focal abnormality. Adrenals/Urinary Tract: Adrenal glands are unremarkable. Kidneys are normal, without renal calculi, focal lesion, or hydronephrosis. Bladder is unremarkable. Stomach/Bowel: Stomach is within normal limits. The appendix is not clearly identified. No evidence of bowel wall thickening, distention, or inflammatory  changes. Noninflamed diverticula are seen throughout the large bowel. Vascular/Lymphatic: No significant vascular findings are present. No enlarged abdominal or pelvic lymph nodes. Reproductive: Status post hysterectomy. No adnexal masses. Other: No abdominal wall hernia or abnormality. No abdominopelvic ascites. Musculoskeletal: No acute or significant osseous findings. IMPRESSION: 1. Findings consistent with mild acute pancreatitis. 2. Colonic diverticulosis. 3. Evidence of prior cholecystectomy with mild subsequent pneumobilia. Electronically Signed   By: Virgina Norfolk M.D.   On: 01/21/2020 15:24        Assessment and Plan:   Principal Problem:   Acute recurrent pancreatitis Active Problems:   Adjustment disorder with mixed anxiety and depressed mood   Leukocytosis   Obesity (BMI 30-39.9)   Gastroesophageal reflux disease  1.  Recurrent acute pancreatitis: Apparently idiopathic in nature.  She is s/p cholecystectomy since 2001.  CT abdomen today does not show any necrotic or hemorrhagic features.  Only reports mild pancreatitis.  Continue to treat conservatively with IV fluids (lactated Ringer's), IV fentanyl for pain management (allergic to codeine, morphine and Dilaudid), n.p.o. except meds.  Advance diet slowly.  Leukocytosis likely reactive, will hold off on antibiotics as of now.  2.  Hypertension: Resume home medications including losartan.  3.  Anxiety/depression: Resume Zoloft/Ambien  4.  GERD: Resume PPI  5.  Obesity (BMI 30-39): Follow-up PCP.  No acute issues.  DVT prophylaxis: Lovenox  COVID screen: Negative  Code Status: DNR as confirmed with patient.Health care proxy would be her son [place]  Patient/Family Communication: Discussed with patient and all questions answered to satisfaction.  Consults called: Will call [facility] in a.m. (patient follows Dr. Ardis Hughs) Admission status :I certify that at the point of admission it is my clinical judgment that the patient  will require inpatient hospital care spanning beyond 2 midnights from the point of admission due to high intensity of service and high frequency of surveillance required.Inpatient status is judged to be reasonable and necessary in order to provide the required intensity of service to ensure the patient's safety. The patient's presenting symptoms, physical exam findings, and initial radiographic and laboratory data in the context of their chronic comorbidities is felt to place them at high risk for further clinical deterioration. The following factors support the patient status of inpatient : Acute pancreatitis requiring IV pain medications, fluids and unable to take p.o.     Guilford Shi MD [facility] Pager in [place]  If 7PM-7AM, please contact night-coverage [email or web address]   01/21/2020, 7:17 PM

## 2020-01-22 LAB — BASIC METABOLIC PANEL
Anion gap: 6 (ref 5–15)
BUN: 12 mg/dL (ref 8–23)
CO2: 25 mmol/L (ref 22–32)
Calcium: 8.3 mg/dL — ABNORMAL LOW (ref 8.9–10.3)
Chloride: 106 mmol/L (ref 98–111)
Creatinine, Ser: 0.64 mg/dL (ref 0.44–1.00)
GFR calc Af Amer: >60 mL/min (ref >60)
GFR calc non Af Amer: >60 mL/min (ref >60)
Glucose, Bld: 92 mg/dL (ref 70–99)
Potassium: 3.5 mmol/L (ref 3.5–5.1)
Sodium: 137 mmol/L (ref 135–145)

## 2020-01-22 LAB — LIPASE, BLOOD: Lipase: 2568 U/L — ABNORMAL HIGH (ref 11–51)

## 2020-01-22 LAB — CBC
HCT: 41.9 % (ref 36.0–46.0)
Hemoglobin: 13.2 g/dL (ref 12.0–15.0)
MCH: 29.1 pg (ref 26.0–34.0)
MCHC: 31.5 g/dL (ref 30.0–36.0)
MCV: 92.5 fL (ref 80.0–100.0)
Platelets: 189 10*3/uL (ref 150–400)
RBC: 4.53 MIL/uL (ref 3.87–5.11)
RDW: 13.4 % (ref 11.5–15.5)
WBC: 10 10*3/uL (ref 4.0–10.5)
nRBC: 0 % (ref 0.0–0.2)

## 2020-01-22 MED ORDER — LIP MEDEX EX OINT
TOPICAL_OINTMENT | CUTANEOUS | Status: AC
  Filled 2020-01-22: qty 7

## 2020-01-22 MED ORDER — LACTULOSE 10 GM/15ML PO SOLN: 20.0000 g | Freq: Two times a day (BID) | ORAL | Status: DC | PRN

## 2020-01-22 NOTE — Plan of Care (Signed)
Plan of care reviewed and discussed with the patient. 

## 2020-01-22 NOTE — Progress Notes (Signed)
Pharmacy Consult: Medication Review  69 yo F with acute episode of pancreatitis.  She has hx of pancreatitis and has had multiple work-ups in the past that have not determined cause of episodes.    Current and PTA med lists reviewed for potential causes of pancreatitis.  None of her medications are known to induce pancreatitis.    There is theoretical risk of SSRIs being linked to pancreatitis based on some case reports but unclear that this is directly related to medication vs. diagnosis of depression/ social history.  Literature search only led to 1 case report specifically involving Zoloft.  Patient notes Zoloft has been held during previous admissions and patient would like to avoid disruption of this medication.  Also, there are post-marketing reports (<0.1% incidence) linking ibuprofen to cases of pancreatitis.  Tylenol is ordered inpatient.   Would not recommend any changes to medications unless there is strong temporal relationship to pancreatitis episodes and one of these medications (zoloft or ibuprofen) in which case consideration could be given to alternate therapy.    Netta Cedars, PharmD, BCPS 01/22/2020@2 :08 PM

## 2020-01-22 NOTE — Progress Notes (Signed)
PROGRESS NOTE    Linda Blackburn  [id number]  DOB: 1951-05-27  PCP: [name], MD 69 y.o. female with history h/o anxiety, depression, hypertension, cholelithiasis s/p cholecystectomy, recurrent acute pancreatitis of unknown etiology (patient has had extensive work-up at [facility] and other tertiary centers with negative work-up) presents with complaints of abdominal pain that started this morning around 9 AM.  Patient denies any precipitating factors.  She states she had typical symptoms associated with prior pancreatitis flares-epigastric/mid abdominal pain 10/10, radiating to the back associated with nausea.  Denies any diarrhea or vomiting. ED work-up: Afebrile, heart rate 50-70, blood pressure systolic 1 [id number] 30, O2 sat 97 to 100% on room air.  WBC 11.3, hemoglobin 15.8, hematocrit 48.8, sodium 139, potassium 4.3, chloride 107, BUN 16, creatinine 0.6, calcium 8.9, LFTs within normal limits, T bili 1.0, lipase 3350.  CT abdomen with mild pancreatitis, prior cholecystectomy and residual pneumobilia.  Patient received IV fentanyl 100 mcg x 2 doses in the ED Patient admitted for further evaluation and management. Subjective: Patient rates improvement in abd pain -in her words "it is down from 15 to 10"   Objective: Vitals:   01/21/20 2100 01/21/20 2150 01/22/20 0525 01/22/20 1327  BP: (!) 143/72 (!) 143/71 107/65 (!) 113/57  Pulse: 64 (!) 59 63 71  Resp:  16 15 18   Temp:  98.3 F (36.8 C) 98.2 F (36.8 C) 98 F (36.7 C)  TempSrc:  Oral    SpO2: 96% 98% 93% 92%  Weight:      Height:        Intake/Output Summary (Last 24 hours) at 01/22/2020 1612 Last data filed at 01/22/2020 0736 Gross per 24 hour  Intake 993.24 ml  Output 600 ml  Net 393.24 ml   Filed Weights   01/21/20 1240  Weight: 90.7 kg    Physical Examination:  General exam: Appears calm and comfortable  Respiratory system: Clear to auscultation. Respiratory effort normal. Cardiovascular system: S1 & S2  heard, RRR. No JVD, murmurs, rubs, gallops or clicks. No pedal edema. Gastrointestinal system: Abdomen is nondistended, soft, moderately tender in epigastrium/mid abdomen. Normal bowel sounds heard. Central nervous system: Alert and oriented. No new focal neurological deficits. Extremities: No contractures, edema or joint deformities.  Skin: No rashes, lesions or ulcers Psychiatry: Judgement and insight appear normal. Mood & affect appropriate.   Data Reviewed: I have personally reviewed following labs and imaging studies  CBC: Recent Labs  Lab 01/21/20 1340 01/22/20 0404  WBC 11.3* 10.0  NEUTROABS 9.0*  --   HGB 15.8* 13.2  HCT 48.8* 41.9  MCV 91.0 92.5  PLT 231 [id number]   Basic Metabolic Panel: Recent Labs  Lab 01/21/20 1320 01/22/20 0404  NA 139 137  K 4.3 3.5  CL 107 106  CO2 24 25  GLUCOSE 109* 92  BUN 16 12  CREATININE 0.60 0.64  CALCIUM 8.9 8.3*   GFR: Estimated Creatinine Clearance: 66.2 mL/min (by C-G formula based on SCr of 0.64 mg/dL). Liver Function Tests: Recent Labs  Lab 01/21/20 1320  AST 28  ALT 25  ALKPHOS 96  BILITOT 1.0  PROT 7.2  ALBUMIN 4.1   Recent Labs  Lab 01/21/20 1320 01/22/20 0404  LIPASE 3,350* 2,568*   No results for input(s): AMMONIA in the last 168 hours. Coagulation Profile: No results for input(s): INR, PROTIME in the last 168 hours. Cardiac Enzymes: No results for input(s): CKTOTAL, CKMB, CKMBINDEX, TROPONINI in the last 168 hours. BNP (last 3 results) No  results for input(s): PROBNP in the last 8760 hours. HbA1C: No results for input(s): HGBA1C in the last 72 hours. CBG: No results for input(s): GLUCAP in the last 168 hours. Lipid Profile: No results for input(s): CHOL, HDL, LDLCALC, TRIG, CHOLHDL, LDLDIRECT in the last 72 hours. Thyroid Function Tests: No results for input(s): TSH, T4TOTAL, FREET4, T3FREE, THYROIDAB in the last 72 hours. Anemia Panel: No results for input(s): VITAMINB12, FOLATE, FERRITIN, TIBC, IRON,  RETICCTPCT in the last 72 hours. Sepsis Labs: No results for input(s): PROCALCITON, LATICACIDVEN in the last 168 hours.  Recent Results (from the past 240 hour(s))  SARS Coronavirus 2 by RT PCR (hospital order, performed in [facility] hospital lab) Nasopharyngeal Nasopharyngeal Swab     Status: None   Collection Time: 01/21/20  3:06 PM   Specimen: Nasopharyngeal Swab  Result Value Ref Range Status   SARS Coronavirus 2 NEGATIVE NEGATIVE Final    Comment: (NOTE) SARS-CoV-2 target nucleic acids are NOT DETECTED. The SARS-CoV-2 RNA is generally detectable in upper and lower respiratory specimens during the acute phase of infection. The lowest concentration of SARS-CoV-2 viral copies this assay can detect is 250 copies / mL. A negative result does not preclude SARS-CoV-2 infection and should not be used as the sole basis for treatment or other patient management decisions.  A negative result may occur with improper specimen collection / handling, submission of specimen other than nasopharyngeal swab, presence of viral mutation(s) within the areas targeted by this assay, and inadequate number of viral copies (<250 copies / mL). A negative result must be combined with clinical observations, patient history, and epidemiological information. Fact Sheet for Patients:   StrictlyIdeas.no Fact Sheet for Healthcare Providers: BankingDealers.co.za This test is not yet approved or cleared  by the [place] FDA and has been authorized for detection and/or diagnosis of SARS-CoV-2 by FDA under an Emergency Use Authorization (EUA).  This EUA will remain in effect (meaning this test can be used) for the duration of the COVID-19 declaration under Section 564(b)(1) of the Act, 21 U.S.C. section 360bbb-3(b)(1), unless the authorization is terminated or revoked sooner. Performed at [facility], [place] [address], [place],  [place]  [address]       Radiology Studies: CT ABDOMEN PELVIS W CONTRAST  Result Date: 01/21/2020 CLINICAL DATA:  Mid abdominal pain. EXAM: CT ABDOMEN AND PELVIS WITH CONTRAST TECHNIQUE: Multidetector CT imaging of the abdomen and pelvis was performed using the standard protocol following bolus administration of intravenous contrast. CONTRAST:  19mL OMNIPAQUE IOHEXOL 300 MG/ML  SOLN COMPARISON:  May 27, 2019 FINDINGS: Lower chest: Very mild atelectasis is seen within the bilateral lung bases. Hepatobiliary: No focal liver abnormality is seen. Mild pneumobilia is noted. Status post cholecystectomy. No biliary dilatation. Pancreas: There is mild peripancreatic inflammatory fat stranding. Spleen: Normal in size without focal abnormality. Adrenals/Urinary Tract: Adrenal glands are unremarkable. Kidneys are normal, without renal calculi, focal lesion, or hydronephrosis. Bladder is unremarkable. Stomach/Bowel: Stomach is within normal limits. The appendix is not clearly identified. No evidence of bowel wall thickening, distention, or inflammatory changes. Noninflamed diverticula are seen throughout the large bowel. Vascular/Lymphatic: No significant vascular findings are present. No enlarged abdominal or pelvic lymph nodes. Reproductive: Status post hysterectomy. No adnexal masses. Other: No abdominal wall hernia or abnormality. No abdominopelvic ascites. Musculoskeletal: No acute or significant osseous findings. IMPRESSION: 1. Findings consistent with mild acute pancreatitis. 2. Colonic diverticulosis. 3. Evidence of prior cholecystectomy with mild subsequent pneumobilia. Electronically Signed   By: Hoover Browns  Houston M.D.   On: 01/21/2020 15:24        Scheduled Meds: . docusate sodium  100 mg Oral BID  . enoxaparin (LOVENOX) injection  40 mg Subcutaneous Q24H  . losartan  100 mg Oral Daily  . oxybutynin  10 mg Oral Daily  . pantoprazole  40 mg Oral Q0600  . sertraline  100 mg Oral Daily   Continuous  Infusions: . lactated ringers 125 mL/hr at 01/22/20 0525   1.  Recurrent acute pancreatitis: Apparently idiopathic in nature-had extensive w/u at [place].  She is s/p cholecystectomy since 2001.  CT abdomen today does not show any necrotic or hemorrhagic features.  Only reports mild pancreatitis.  Continue to treat conservatively with IV fluids (lactated Ringer's), IV fentanyl for pain management (allergic to codeine, morphine and Dilaudid), n.p.o. except meds.  -does not feel ready for diet. Appreciate GI input.  Leukocytosis likely reactive, will hold off on antibiotics as of now. Appreciate pharmacy assistance-reviewed home meds and no concerns for drugs contributing to pancreatitis.   2.  Hypertension: continue losartan.  3.  Anxiety/depression: on Zoloft/Ambien  4.  GERD: continue PPI  5.  Obesity (BMI 30-39): Follow-up PCP.  No acute issues.   DVT prophylaxis: Lovenox  Status is: Inpatient  Remains inpatient appropriate because:IV treatments appropriate due to intensity of illness or inability to take PO   Dispo: The patient is from: Home              Anticipated d/c is to: Home              Anticipated d/c date is: 2 days              Patient currently is not medically stable to d/c.         LOS: 1 day    Time spent: 25 minutes    Guilford Shi, MD [facility] Pager in [place]  If 7PM-7AM, please contact night-coverage [email or web address] 01/22/2020, 4:12 PM

## 2020-01-22 NOTE — Consult Note (Addendum)
Referring Provider:  [facility]         Primary Care Physician:  [name], MD Primary Gastroenterologist:  Oretha Caprice, MD                We were asked to see this patient for:   pancreatitis               ASSESSMENT /  PLAN    69 year old female with PMH significant for recurrent idiopathic pancreatitis and biliary pancreatitis, anxiety, depression, HTN, cholecystectomy  # Recurrent pancreatitis.  --Mild pancreatitis by CT scan --This time there is no evidence for bile duct stone as there was in Sept 2020. Etiologies of all the other numerous episodes through the years have never been determined.  --She is followed by [facility] who have apparently performed numerous studies on her in the past looking for causes of recurrent pancreatitis.  --CBC normal. Renal function normal.  --NPO for now. Maybe trial of clears in am.  --Continue anti-emetic, analgesics, IVF at 125 ml /hr    # Possible early cirrhosis on MRCP September 2020. Not mentioned on previous studies nor on CT scan w/ contrast this admission --Can follow up outpatient.   HPI:    Chief Complaint: Pancreatitis  Linda Blackburn is a 69 y.o. female with a history of recurrent idiopathic pancreatitis.  She is known to Dr. Ardis Hughs, has been evaluated by Dr. Zenia Resides at [facility] in 2016 and also evaluated at [facility] January 2020.  Patient is a nondrinker, she had a cholecystectomy years ago without evidence of retained stone/sludge.  She has undergone 2 or 3 ERCPs at which time pancreatic duct was not able to be cannulated.  despite a  " pre-cut" pancreatic duct sphincterotomy .  Major papilla located within a diverticulum. Discussions have been held about a Whipple surgery but patient was not interested.    05/27/19 -hospitalization for pancreatitis with associated jaundice ( new).  No ductal dilation on imaging.  Treated with Zosyn.  MRCP showed CBD dilation with a distal CBD  stone, mild pancreatitis, possibly early cirrhosis.  She underwent inpatient ERCP.  Major papilla was located entirely within a diverticulum.  The prior biliary sphincterotomy appeared open.  A stone was found on the cholangiogram.  Sphincteroplasty performed and stone removed.LFTs improved post procedure  Around 9 AM yesterday morning patient developed feeling of clamminess, dizziness, upper abdominal pain, nausea and vomiting.  Generally when she has pancreatitis her pain starts more in the epigastrium and radiates through to her right back.  This time the pain started in the mid upper abdomen.  It quickly became more diffuse across her whole upper abdomen.  Patient has had episodes of upper abdominal pain at home since her last hospitalization in September 2020.  Generally she is able to manage the transient pain at home but this time felt that it was going to require ED evaluation.  Patient presented to the ED yesterday, CT scan shows mild acute pancreatitis.. Lipase was 3350.  Liver tests were normal.  White count mildly elevated 11.3.  Hemoglobin 15.8.  Overall the pain has improved today.  She has not had any vomiting but still dealing with some nausea.   Previous evaluations:  09/09/2018 ERCP for acute recurrent pancreatitis, [facility]: Major papilla was located entirely within a diverticulum, prior biliary endoscopic sphincterotomy appeared open, cholangiogram normal, biliary tree was swept and nothing was found, failure to cannulate the DPD or VPD  03/25/2015 ERCP for acute recurrent pancreatitis at [facility]: Major papilla was located partially within a diverticulum, which contained food debris, prior biliary endoscopic sphincterotomy appeared open, a precut pancreatic sphincterotomy at the major papilla was performed  05/28/20 ERCP ( Dr. Rush Landmark) for presumed CBD stone.  --No gross lesions in esophagus. Z-line regular, 40 cm from the incisors. - Erythematous mucosa in the gastric  body. Biopsied for HP. - Mucosal nodule found in the duodenum - likely Bruenner's gland biopsied. - A single duodenal polyp. Polypectomy performed. Resected and retrieved. Clip was placed. - The major papilla was located entirely within a diverticulum. - Prior biliary sphincterotomy appeared open. - Prior minor papilla sphincterotomy/ precut was noted. - A filling defect consistent with a stone was seen on the cholangiogram. - The entire main bile duct was moderately dilated. - Sphincteroplasty performed. - Choledocholithiasis was found. Complete removal was accomplished by sphincteroplasty and balloon sweeping.   March 2016 colonoscopy for routine risk colon cancer screening.  --single subcentimeter polyp. It was adenomatous and repeat colonoscopy at 5-year interval.  Past Medical History:  Diagnosis Date  . Abnormal uterine bleeding   . Amenorrhea   . Anxiety   . Depression   . Gallstones   . Hay fever   . HTN (hypertension)   . Pancreatitis   . Urine incontinence   . UTI (urinary tract infection)    reoccuring     Past Surgical History:  Procedure Laterality Date  . ABDOMINAL HYSTERECTOMY  1984   fibroids  . APPENDECTOMY  1984  . BILIARY DILATION  05/29/2019   Procedure: BILIARY DILATION;  Surgeon: Rush Landmark Telford Nab., MD;  Location: [facility];  Service: Gastroenterology;;  . BIOPSY  05/29/2019   Procedure: BIOPSY;  Surgeon: Irving Copas., MD;  Location: [facility];  Service: Gastroenterology;;  . [name]  09/08/1999  . ERCP N/A 05/29/2019   Procedure: ENDOSCOPIC RETROGRADE CHOLANGIOPANCREATOGRAPHY (ERCP);  Surgeon: Irving Copas., MD;  Location: [facility];  Service: Gastroenterology;  Laterality: N/A;  . ERCP W/ SPHINCTEROTOMY AND BALLOON DILATION  08/2008  . ESOPHAGOGASTRODUODENOSCOPY (EGD) WITH PROPOFOL N/A 05/29/2019   Procedure: ESOPHAGOGASTRODUODENOSCOPY (EGD) WITH PROPOFOL;  Surgeon: Rush Landmark Telford Nab., MD;  Location: [facility]  ENDOSCOPY;  Service: Gastroenterology;  Laterality: N/A;  . OOPHORECTOMY     Only 1 was taken  . POLYPECTOMY  05/29/2019   Procedure: POLYPECTOMY;  Surgeon: Mansouraty, Telford Nab., MD;  Location: [facility];  Service: Gastroenterology;;  . REMOVAL OF STONES  05/29/2019   Procedure: REMOVAL OF STONES;  Surgeon: Irving Copas., MD;  Location: [facility];  Service: Gastroenterology;;  . TONSILLECTOMY    . TUBAL LIGATION      Prior to Admission medications   Medication Sig Start Date End Date Taking? Authorizing Provider  ibuprofen (ADVIL) 200 MG tablet Take 400 mg by mouth every 6 (six) hours as needed for headache or moderate pain.   Yes [provider]  losartan (COZAAR) 100 MG tablet Take 1 tablet (100 mg total) by mouth daily. 08/18/19  Yes Burchette, Alinda Sierras, MD  ondansetron (ZOFRAN-ODT) 4 MG disintegrating tablet Take 1 tablet (4 mg total) by mouth every 8 (eight) hours as needed. Patient taking differently: Take 4 mg by mouth every 8 (eight) hours as needed for nausea or vomiting.  08/18/19  Yes Burchette, Alinda Sierras, MD  oxybutynin (DITROPAN-XL) 10 MG 24 hr tablet Take 1 tablet (10 mg total) by mouth daily after breakfast. 06/06/19  Yes Burchette, Alinda Sierras, MD  pantoprazole (PROTONIX) 40  MG tablet Take 1 tablet (40 mg total) by mouth daily at 6 (six) AM. 06/06/19  Yes Burchette, Alinda Sierras, MD  sertraline (ZOLOFT) 100 MG tablet Take 1 tablet (100 mg total) by mouth daily. 08/18/19  Yes Burchette, Alinda Sierras, MD  zolpidem (AMBIEN) 5 MG tablet Take 1 tablet (5 mg total) by mouth at bedtime as needed for sleep. 08/07/16  Yes Burchette, Alinda Sierras, MD  cephALEXin (KEFLEX) 500 MG capsule Take 1 capsule (500 mg total) by mouth 3 (three) times daily. Patient not taking: Reported on 01/21/2020 12/02/19   [name], MD  fluconazole (DIFLUCAN) 100 MG tablet Take 1 tablet (100 mg total) by mouth daily. Patient not taking: Reported on 01/21/2020 10/14/19   [name], MD     Current Facility-Administered Medications  Medication Dose Route Frequency Provider Last Rate Last Admin  . acetaminophen (TYLENOL) tablet 650 mg  650 mg Oral Q6H PRN Guilford Shi, MD       Or  . acetaminophen (TYLENOL) suppository 650 mg  650 mg Rectal Q6H PRN Kamineni, Neelima, MD      . albuterol (PROVENTIL) (2.5 MG/3ML) 0.083% nebulizer solution 2.5 mg  2.5 mg Nebulization Q2H PRN Kamineni, Neelima, MD      . docusate sodium (COLACE) capsule 100 mg  100 mg Oral BID Guilford Shi, MD   100 mg at 01/22/20 0938  . enoxaparin (LOVENOX) injection 40 mg  40 mg Subcutaneous Q24H Guilford Shi, MD   40 mg at 01/21/20 2237  . fentaNYL (SUBLIMAZE) injection 50-100 mcg  50-100 mcg Intravenous Q2H PRN Guilford Shi, MD   100 mcg at 01/22/20 1259  . lactated ringers infusion   Intravenous Continuous Guilford Shi, MD 125 mL/hr at 01/22/20 0525 Rate Verify at 01/22/20 0525  . lactulose (CHRONULAC) 10 GM/15ML solution 20 g  20 g Oral BID PRN Guilford Shi, MD      . losartan (COZAAR) tablet 100 mg  100 mg Oral Daily Guilford Shi, MD   100 mg at 01/22/20 0938  . ondansetron (ZOFRAN) injection 4 mg  4 mg Intravenous Q8H PRN Lang Snow, FNP   4 mg at 01/22/20 0748  . oxybutynin (DITROPAN-XL) 24 hr tablet 10 mg  10 mg Oral Daily Guilford Shi, MD   10 mg at 01/22/20 0938  . pantoprazole (PROTONIX) EC tablet 40 mg  40 mg Oral Q0600 Guilford Shi, MD   40 mg at 01/22/20 0726  . prochlorperazine (COMPAZINE) injection 10 mg  10 mg Intravenous Q4H PRN Lang Snow, FNP   10 mg at 01/22/20 1259  . sertraline (ZOLOFT) tablet 100 mg  100 mg Oral Daily Guilford Shi, MD   100 mg at 01/22/20 [id number]  . zolpidem (AMBIEN) tablet 5 mg  5 mg Oral QHS PRN Guilford Shi, MD        Allergies as of 01/21/2020 - Review Complete 01/21/2020  Allergen Reaction Noted  . Other Hives and Swelling 10/04/2016  . Buprenorphine hcl  03/20/2016  . Codeine  12/06/2010  . Dilaudid  [hydromorphone hcl] Itching 12/02/2018  . Morphine and related  12/06/2010    Family History  Problem Relation Age of Onset  . Alcohol abuse Mother   . Prostate cancer Father   . Stroke Maternal Aunt   . Hiatal hernia Maternal Aunt   . Prostate cancer Paternal Grandfather   . Cerebral palsy Granddaughter   . Breast cancer Neg Hx     Social History   Socioeconomic History  . Marital status:  Widowed    Spouse name: Not on file  . Number of children: 2  . Years of education: Not on file  . Highest education level: Not on file  Occupational History  . Occupation: retired  Tobacco Use  . Smoking status: Never Smoker  . Smokeless tobacco: Never Used  Substance and Sexual Activity  . Alcohol use: Yes    Alcohol/week: 0.0 standard drinks    Comment: occ - hardly every   . Drug use: No  . Sexual activity: Not Currently    Partners: Male    Birth control/protection: Surgical  Other Topics Concern  . Not on file  Social History Narrative   10/14/2019: Lives alone in apartment.    Has two sons, one in [place] and one in [place], 5 grandchildren    Has considered moving closer to sons but likes [place] too much.   Neighbors good social support   Enjoys watching soap operas, walking when she feels well, but recently struggling with more frequent episodes of pancreatitis requiring hospitalization.   Keeps positive attitude, "one day at a time"      Social Determinants of Health   Financial Resource Strain: Low Risk   . Difficulty of Paying Living Expenses: Not hard at all  Food Insecurity: No Food Insecurity  . Worried About Charity fundraiser in the Last Year: Never true  . Ran Out of Food in the Last Year: Never true  Transportation Needs: No Transportation Needs  . Lack of Transportation (Medical): No  . Lack of Transportation (Non-Medical): No  Physical Activity: Insufficiently Active  . Days of Exercise per Week: 3 days  . Minutes of Exercise per Session: 20 min  Stress: Stress  Concern Present  . Feeling of Stress : To some extent  Social Connections: Unknown  . Frequency of Communication with Friends and Family: More than three times a week  . Frequency of Social Gatherings with Friends and Family: Once a week  . Attends Religious Services: Not on file  . Active Member of Clubs or Organizations: Yes  . Attends Archivist Meetings: Not on file  . Marital Status: Widowed  Intimate Partner Violence:   . Fear of Current or Ex-Partner:   . Emotionally Abused:   Marland Kitchen Physically Abused:   . Sexually Abused:     Review of Systems: All systems reviewed and negative except where noted in HPI.  Physical Exam: Vital signs in last 24 hours: Temp:  [98.2 F (36.8 C)-98.3 F (36.8 C)] 98.2 F (36.8 C) (05/20 0525) Pulse Rate:  [51-72] 63 (05/20 0525) Resp:  [14-19] 15 (05/20 0525) BP: (107-161)/(61-84) 107/65 (05/20 0525) SpO2:  [92 %-100 %] 93 % (05/20 0525) Last BM Date: 01/21/20 General:   Alert,female in NAD Psych:  Pleasant, cooperative. Normal mood and affect. Eyes:  Pupils equal, sclera clear, no icterus.   Conjunctiva pink. Ears:  Normal auditory acuity. Nose:  No deformity, discharge,  or lesions. Neck:  Supple; no masses Lungs:  Clear throughout to auscultation.   No wheezes, crackles, or rhonchi.  Heart:  Regular rate and rhythm;  no lower extremity edema Abdomen:  Soft, non-distended, moderate tenderness across whole upper abdomen, BS active, no palp mass   Rectal:  Deferred  Msk:  Symmetrical without gross deformities. . Neurologic:  Alert and  oriented x4;  grossly normal neurologically. Skin:  Intact without significant lesions or rashes.   Intake/Output from previous day: 05/19 0701 - 05/20 0700 In: 1963.2 [I.V.:963.2; IV  Piggyback:1000] Out: 300 [Urine:300] Intake/Output this shift: Total I/O In: 30 [P.O.:30] Out: 300 [Urine:300]  Lab Results: Recent Labs    01/21/20 1340 01/22/20 0404  WBC 11.3* 10.0  HGB 15.8* 13.2    HCT 48.8* 41.9  PLT 231 189   BMET Recent Labs    01/21/20 1320 01/22/20 0404  NA 139 137  K 4.3 3.5  CL 107 106  CO2 24 25  GLUCOSE 109* 92  BUN 16 12  CREATININE 0.60 0.64  CALCIUM 8.9 8.3*   LFT Recent Labs    01/21/20 1320  PROT 7.2  ALBUMIN 4.1  AST 28  ALT 25  ALKPHOS 96  BILITOT 1.0    . CBC Latest Ref Rng & Units 01/22/2020 01/21/2020 05/30/2019  WBC 4.0 - 10.5 K/uL 10.0 11.3(H) 8.3  Hemoglobin 12.0 - 15.0 g/dL 13.2 15.8(H) 12.6  Hematocrit 36.0 - 46.0 % 41.9 48.8(H) 40.9  Platelets 150 - 400 K/uL 189 231 252    . CMP Latest Ref Rng & Units 01/22/2020 01/21/2020 06/06/2019  Glucose 70 - 99 mg/dL 92 109(H) -  BUN 8 - 23 mg/dL 12 16 -  Creatinine 0.44 - 1.00 mg/dL 0.64 0.60 -  Sodium 135 - 145 mmol/L 137 139 -  Potassium 3.5 - 5.1 mmol/L 3.5 4.3 -  Chloride 98 - 111 mmol/L 106 107 -  CO2 22 - 32 mmol/L 25 24 -  Calcium 8.9 - 10.3 mg/dL 8.3(L) 8.9 -  Total Protein 6.5 - 8.1 g/dL - 7.2 7.8  Total Bilirubin 0.3 - 1.2 mg/dL - 1.0 1.3(H)  Alkaline Phos 38 - 126 U/L - 96 199(H)  AST 15 - 41 U/L - 28 48(H)  ALT 0 - 44 U/L - 25 101(H)   Studies/Results: CT ABDOMEN PELVIS W CONTRAST  Result Date: 01/21/2020 CLINICAL DATA:  Mid abdominal pain. EXAM: CT ABDOMEN AND PELVIS WITH CONTRAST TECHNIQUE: Multidetector CT imaging of the abdomen and pelvis was performed using the standard protocol following bolus administration of intravenous contrast. CONTRAST:  174mL OMNIPAQUE IOHEXOL 300 MG/ML  SOLN COMPARISON:  May 27, 2019 FINDINGS: Lower chest: Very mild atelectasis is seen within the bilateral lung bases. Hepatobiliary: No focal liver abnormality is seen. Mild pneumobilia is noted. Status post cholecystectomy. No biliary dilatation. Pancreas: There is mild peripancreatic inflammatory fat stranding. Spleen: Normal in size without focal abnormality. Adrenals/Urinary Tract: Adrenal glands are unremarkable. Kidneys are normal, without renal calculi, focal lesion, or  hydronephrosis. Bladder is unremarkable. Stomach/Bowel: Stomach is within normal limits. The appendix is not clearly identified. No evidence of bowel wall thickening, distention, or inflammatory changes. Noninflamed diverticula are seen throughout the large bowel. Vascular/Lymphatic: No significant vascular findings are present. No enlarged abdominal or pelvic lymph nodes. Reproductive: Status post hysterectomy. No adnexal masses. Other: No abdominal wall hernia or abnormality. No abdominopelvic ascites. Musculoskeletal: No acute or significant osseous findings. IMPRESSION: 1. Findings consistent with mild acute pancreatitis. 2. Colonic diverticulosis. 3. Evidence of prior cholecystectomy with mild subsequent pneumobilia. Electronically Signed   By: Virgina Norfolk M.D.   On: 01/21/2020 15:24     Tye Savoy, NP-C @  01/22/2020, 12:59 PM  ________________________________________________________________________  Velora Heckler GI MD note:  I personally examined the patient, reviewed the data and agree with the assessment and plan described above.  Another episode of acute pancreatitis of unclear etiology.  She's had evaluations at [place] over the past few years (see above) without clear explanation.  Last year, she had CBD stone removed by Dr.  Mansouraty ERCP.  Given normal liver tests this time I don't think she has recurrent stone.  Mild acute pancreatitis by imaging but she's pretty tender still on exam tonight and has no bowel sounds.  I don't know if she'll be ready to advance to clears tomorrow, we'll see. Plans for continued conservative management with IV fluids, pain meds, bowel rest for now.   Owens Loffler, MD [facility] Pager [phone]

## 2020-01-23 DIAGNOSIS — K859 Acute pancreatitis without necrosis or infection, unspecified: Secondary | ICD-10-CM

## 2020-01-23 NOTE — Progress Notes (Signed)
Progress Note   Subjective  Patient states feeling a bit better today. Pain persists and she is tender but making progress. No vomiting. Passing gas. She was able to get to the bathroom and out of bed. Not hungry, not much appetite.   Objective   Vital signs in last 24 hours: Temp:  [98 F (36.7 C)-98.7 F (37.1 C)] 98.4 F (36.9 C) (05/21 0545) Pulse Rate:  [68-75] 75 (05/21 0545) Resp:  [15-18] 15 (05/21 0545) BP: (110-134)/(57-60) 134/60 (05/21 0545) SpO2:  [92 %-93 %] 93 % (05/21 0545) Last BM Date: 01/21/20 General:    white female in NAD Heart:  Regular rate and rhythm; no murmurs Lungs: Respirations even and unlabored, lungs CTA bilaterally Abdomen:  Soft, diffuse mild TTP without peritoneal signs.  Extremities:  Without edema. Neurologic:  Alert and oriented,  grossly normal neurologically. Psych:  Cooperative. Normal mood and affect.  Intake/Output from previous day: 05/20 0701 - 05/21 0700 In: 2966.9 [P.O.:30; I.V.:2936.9] Out: 1350 [Urine:1350] Intake/Output this shift: No intake/output data recorded.  Lab Results: Recent Labs    01/21/20 1340 01/22/20 0404  WBC 11.3* 10.0  HGB 15.8* 13.2  HCT 48.8* 41.9  PLT 231 189   BMET Recent Labs    01/21/20 1320 01/22/20 0404  NA 139 137  K 4.3 3.5  CL 107 106  CO2 24 25  GLUCOSE 109* 92  BUN 16 12  CREATININE 0.60 0.64  CALCIUM 8.9 8.3*   LFT Recent Labs    01/21/20 1320  PROT 7.2  ALBUMIN 4.1  AST 28  ALT 25  ALKPHOS 96  BILITOT 1.0   PT/INR No results for input(s): LABPROT, INR in the last 72 hours.  Studies/Results: CT ABDOMEN PELVIS W CONTRAST  Result Date: 01/21/2020 CLINICAL DATA:  Mid abdominal pain. EXAM: CT ABDOMEN AND PELVIS WITH CONTRAST TECHNIQUE: Multidetector CT imaging of the abdomen and pelvis was performed using the standard protocol following bolus administration of intravenous contrast. CONTRAST:  129mL OMNIPAQUE IOHEXOL 300 MG/ML  SOLN COMPARISON:  May 27, 2019 FINDINGS: Lower chest: Very mild atelectasis is seen within the bilateral lung bases. Hepatobiliary: No focal liver abnormality is seen. Mild pneumobilia is noted. Status post cholecystectomy. No biliary dilatation. Pancreas: There is mild peripancreatic inflammatory fat stranding. Spleen: Normal in size without focal abnormality. Adrenals/Urinary Tract: Adrenal glands are unremarkable. Kidneys are normal, without renal calculi, focal lesion, or hydronephrosis. Bladder is unremarkable. Stomach/Bowel: Stomach is within normal limits. The appendix is not clearly identified. No evidence of bowel wall thickening, distention, or inflammatory changes. Noninflamed diverticula are seen throughout the large bowel. Vascular/Lymphatic: No significant vascular findings are present. No enlarged abdominal or pelvic lymph nodes. Reproductive: Status post hysterectomy. No adnexal masses. Other: No abdominal wall hernia or abnormality. No abdominopelvic ascites. Musculoskeletal: No acute or significant osseous findings. IMPRESSION: 1. Findings consistent with mild acute pancreatitis. 2. Colonic diverticulosis. 3. Evidence of prior cholecystectomy with mild subsequent pneumobilia. Electronically Signed   By: Virgina Norfolk M.D.   On: 01/21/2020 15:24       Assessment / Plan:   69 y/o female admitted with recurrent idiopathic acute pancreatitis.  Numerous episodes over the past 20 years, appears to be getting more frequent over time.  She is had an extensive evaluation at [facility] over the years without clear etiology.  She has been offered empiric Whipple in the past but has declined.  She has not on any medications over the years  persistently that would account for this. No remarkable FH of pancreatic diseases. She did have a common bile duct stone removed by Dr. Allison Quarry last year however her LFTs are normal on presentation and this does not appear consistent with biliary pancreatitis.  Her  renal function her labs are otherwise stable, pancreatitis appears mild.  She is improved since yesterday, has started ambulating some.  She states she is not ready for clear liquid diet this morning but may consider it later today.  Okay to advance diet to clears later today whenever she is ready.  Otherwise continue conservative management with fluids and pain medication.  Encourage ambulation as tolerated.  Call with questions we will continue to follow.  [place] Cellar, MD [facility]

## 2020-01-23 NOTE — Progress Notes (Signed)
PROGRESS NOTE    Linda Blackburn  [id number]  DOB: Feb 14, 1951  PCP: [name], MD 69 y.o. female with history h/o anxiety, depression, hypertension, cholelithiasis s/p cholecystectomy, recurrent acute pancreatitis of unknown etiology (patient has had extensive work-up at [facility] and other tertiary centers with negative work-up) presents with complaints of abdominal pain that started this morning around 9 AM.  Patient denies any precipitating factors.  She states she had typical symptoms associated with prior pancreatitis flares-epigastric/mid abdominal pain 10/10, radiating to the back associated with nausea.  Denies any diarrhea or vomiting. ED work-up: Afebrile, heart rate 50-70, blood pressure systolic 1 [id number], O2 sat 97 to 100% on room air.  WBC 11.3, hemoglobin 15.8, hematocrit 48.8, sodium 139, potassium 4.3, chloride 107, BUN 16, creatinine 0.6, calcium 8.9, LFTs within normal limits, T bili 1.0, lipase 3350.  CT abdomen with mild pancreatitis, prior cholecystectomy and residual pneumobilia.  Patient received IV fentanyl 100 mcg x 2 doses in the ED Patient admitted for further evaluation and management. Subjective: Patient rates improvement in abd pain but still doesn't feel ready to eat . Seen by GI.   Objective: Vitals:   01/22/20 2129 01/23/20 0545 01/23/20 0905 01/23/20 1427  BP: (!) 110/58 134/60 139/76 (!) 155/69  Pulse: 68 75 80 75  Resp: 15 15    Temp: 98.7 F (37.1 C) 98.4 F (36.9 C)  98 F (36.7 C)  TempSrc:      SpO2: 93% 93% 93% 95%  Weight:      Height:        Intake/Output Summary (Last 24 hours) at 01/23/2020 1528 Last data filed at 01/23/2020 1438 Gross per 24 hour  Intake 2925.88 ml  Output 1850 ml  Net 1075.88 ml   Filed Weights   01/21/20 1240  Weight: 90.7 kg    Physical Examination:  General exam: Appears calm and comfortable  Respiratory system: Clear to auscultation. Respiratory effort normal. Cardiovascular system: S1 & S2  heard, RRR. No JVD, murmurs, rubs, gallops or clicks. No pedal edema. Gastrointestinal system: Abdomen is nondistended, soft, moderately tender in epigastrium/mid abdomen. Normal bowel sounds heard. Central nervous system: Alert and oriented. No new focal neurological deficits. Extremities: No contractures, edema or joint deformities.  Skin: No rashes, lesions or ulcers Psychiatry: Judgement and insight appear normal. Mood & affect appropriate.   Data Reviewed: I have personally reviewed following labs and imaging studies  CBC: Recent Labs  Lab 01/21/20 1340 01/22/20 0404  WBC 11.3* 10.0  NEUTROABS 9.0*  --   HGB 15.8* 13.2  HCT 48.8* 41.9  MCV 91.0 92.5  PLT 231 [id number]   Basic Metabolic Panel: Recent Labs  Lab 01/21/20 1320 01/22/20 0404  NA 139 137  K 4.3 3.5  CL 107 106  CO2 24 25  GLUCOSE 109* 92  BUN 16 12  CREATININE 0.60 0.64  CALCIUM 8.9 8.3*   GFR: Estimated Creatinine Clearance: 66.2 mL/min (by C-G formula based on SCr of 0.64 mg/dL). Liver Function Tests: Recent Labs  Lab 01/21/20 1320  AST 28  ALT 25  ALKPHOS 96  BILITOT 1.0  PROT 7.2  ALBUMIN 4.1   Recent Labs  Lab 01/21/20 1320 01/22/20 0404  LIPASE 3,350* 2,568*   No results for input(s): AMMONIA in the last 168 hours. Coagulation Profile: No results for input(s): INR, PROTIME in the last 168 hours. Cardiac Enzymes: No results for input(s): CKTOTAL, CKMB, CKMBINDEX, TROPONINI in the last 168 hours. BNP (last 3 results) No results  for input(s): PROBNP in the last 8760 hours. HbA1C: No results for input(s): HGBA1C in the last 72 hours. CBG: No results for input(s): GLUCAP in the last 168 hours. Lipid Profile: No results for input(s): CHOL, HDL, LDLCALC, TRIG, CHOLHDL, LDLDIRECT in the last 72 hours. Thyroid Function Tests: No results for input(s): TSH, T4TOTAL, FREET4, T3FREE, THYROIDAB in the last 72 hours. Anemia Panel: No results for input(s): VITAMINB12, FOLATE, FERRITIN, TIBC, IRON,  RETICCTPCT in the last 72 hours. Sepsis Labs: No results for input(s): PROCALCITON, LATICACIDVEN in the last 168 hours.  Recent Results (from the past 240 hour(s))  SARS Coronavirus 2 by RT PCR (hospital order, performed in [facility] hospital lab) Nasopharyngeal Nasopharyngeal Swab     Status: None   Collection Time: 01/21/20  3:06 PM   Specimen: Nasopharyngeal Swab  Result Value Ref Range Status   SARS Coronavirus 2 NEGATIVE NEGATIVE Final    Comment: (NOTE) SARS-CoV-2 target nucleic acids are NOT DETECTED. The SARS-CoV-2 RNA is generally detectable in upper and lower respiratory specimens during the acute phase of infection. The lowest concentration of SARS-CoV-2 viral copies this assay can detect is 250 copies / mL. A negative result does not preclude SARS-CoV-2 infection and should not be used as the sole basis for treatment or other patient management decisions.  A negative result may occur with improper specimen collection / handling, submission of specimen other than nasopharyngeal swab, presence of viral mutation(s) within the areas targeted by this assay, and inadequate number of viral copies (<250 copies / mL). A negative result must be combined with clinical observations, patient history, and epidemiological information. Fact Sheet for Patients:   StrictlyIdeas.no Fact Sheet for Healthcare Providers: BankingDealers.co.za This test is not yet approved or cleared  by the [place] FDA and has been authorized for detection and/or diagnosis of SARS-CoV-2 by FDA under an Emergency Use Authorization (EUA).  This EUA will remain in effect (meaning this test can be used) for the duration of the COVID-19 declaration under Section 564(b)(1) of the Act, 21 U.S.C. section 360bbb-3(b)(1), unless the authorization is terminated or revoked sooner. Performed at [facility], [place] [address], [place], [place]  [address]       Radiology Studies: No results found.      Scheduled Meds: . docusate sodium  100 mg Oral BID  . enoxaparin (LOVENOX) injection  40 mg Subcutaneous Q24H  . losartan  100 mg Oral Daily  . oxybutynin  10 mg Oral Daily  . pantoprazole  40 mg Oral Q0600  . sertraline  100 mg Oral Daily   Continuous Infusions: . lactated ringers 125 mL/hr at 01/23/20 1340   1.  Recurrent acute pancreatitis: Apparently idiopathic in nature-had extensive w/u at [place]. Declined whipple procedure.  She is s/p cholecystectomy since 2001.  CT abdomen did not show any necrotic or hemorrhagic features.  Only reported mild pancreatitis. Lipase downtrending and abdominal pain improving, albeit very slowly.   Continue to treat conservatively with IV fluids (lactated Ringer's), IV fentanyl for pain management (allergic to codeine, morphine and Dilaudid), n.p.o. except meds.  -does not feel ready for diet. Appreciate GI input.  Leukocytosis likely reactive, resolved. Appreciate pharmacy assistance-reviewed home meds and no concerns for drugs contributing to pancreatitis.  Will let GI guide further management/diet advancement.  2.  Hypertension: continue losartan.  3.  Anxiety/depression: on Zoloft/Ambien  4.  GERD: continue PPI  5.  Obesity (BMI 30-39): Follow-up PCP.  No acute issues.  DVT prophylaxis: Lovenox  Status is: Inpatient  Remains inpatient appropriate because:IV treatments appropriate due to intensity of illness or inability to take PO   Dispo: The patient is from: Home              Anticipated d/c is to: Home              Anticipated d/c date is: 2 days              Patient currently is not medically stable to d/c.         LOS: 2 days    Time spent: 25 minutes    Guilford Shi, MD [facility] Pager in [place]  If 7PM-7AM, please contact night-coverage [email or web address] 01/23/2020, 3:28 PM

## 2020-01-23 NOTE — Plan of Care (Signed)

## 2020-01-24 DIAGNOSIS — K2100 Gastro-esophageal reflux disease with esophagitis, without bleeding: Secondary | ICD-10-CM

## 2020-01-24 LAB — COMPREHENSIVE METABOLIC PANEL
ALT: 16 U/L (ref 0–44)
AST: 19 U/L (ref 15–41)
Albumin: 3.3 g/dL — ABNORMAL LOW (ref 3.5–5.0)
Alkaline Phosphatase: 74 U/L (ref 38–126)
Anion gap: 6 (ref 5–15)
BUN: 10 mg/dL (ref 8–23)
CO2: 25 mmol/L (ref 22–32)
Calcium: 8.4 mg/dL — ABNORMAL LOW (ref 8.9–10.3)
Chloride: 107 mmol/L (ref 98–111)
Creatinine, Ser: 0.56 mg/dL (ref 0.44–1.00)
GFR calc Af Amer: >60 mL/min (ref >60)
GFR calc non Af Amer: >60 mL/min (ref >60)
Glucose, Bld: 60 mg/dL — ABNORMAL LOW (ref 70–99)
Potassium: 3.2 mmol/L — ABNORMAL LOW (ref 3.5–5.1)
Sodium: 138 mmol/L (ref 135–145)
Total Bilirubin: 1.5 mg/dL — ABNORMAL HIGH (ref 0.3–1.2)
Total Protein: 6.1 g/dL — ABNORMAL LOW (ref 6.5–8.1)

## 2020-01-24 LAB — LIPASE, BLOOD: Lipase: 64 U/L — ABNORMAL HIGH (ref 11–51)

## 2020-01-24 LAB — LIPID PANEL
Cholesterol: 154 mg/dL (ref 0–200)
HDL: 35 mg/dL — ABNORMAL LOW (ref >40)
LDL Cholesterol: 98 mg/dL (ref 0–99)
Total CHOL/HDL Ratio: 4.4 RATIO
Triglycerides: 103 mg/dL (ref <150)
VLDL: 21 mg/dL (ref 0–40)

## 2020-01-24 MED ORDER — POTASSIUM CHLORIDE CRYS ER 20 MEQ PO TBCR
40.0000 meq | EXTENDED_RELEASE_TABLET | Freq: Once | ORAL | Status: AC
  Administered 2020-01-24: 40 meq via ORAL
  Filled 2020-01-24: qty 2

## 2020-01-24 NOTE — Progress Notes (Signed)
[facility] Progress Note    Since last GI note: Was improving clinically until early this AM and had pretty dramatic epigastric pains that were transient.  We spoke about many chronic issues this AM (alternating bowel patters, intermittent dyspepsia)  Objective: Vital signs in last 24 hours: Temp:  [97.9 F (36.6 C)-98.2 F (36.8 C)] 97.9 F (36.6 C) (05/22 0525) Pulse Rate:  [63-80] 63 (05/22 0525) Resp:  [17] 17 (05/22 0525) BP: (130-164)/(69-80) 130/70 (05/22 0525) SpO2:  [92 %-95 %] 92 % (05/22 0525) Last BM Date: 01/21/20 General: alert and oriented times 3 Heart: regular rate and rythm Abdomen: soft,mildly tender throughout, non-distended, normal bowel sounds   Lab Results: Recent Labs    01/21/20 1340 01/22/20 0404  WBC 11.3* 10.0  HGB 15.8* 13.2  PLT 231 189  MCV 91.0 92.5   Recent Labs    01/21/20 1320 01/22/20 0404 01/24/20 0332  NA 139 137 138  K 4.3 3.5 3.2*  CL 107 106 107  CO2 24 25 25   GLUCOSE 109* 92 60*  BUN 16 12 10   CREATININE 0.60 0.64 0.56  CALCIUM 8.9 8.3* 8.4*   Recent Labs    01/21/20 1320 01/24/20 0332  PROT 7.2 6.1*  ALBUMIN 4.1 3.3*  AST 28 19  ALT 25 16  ALKPHOS 96 74  BILITOT 1.0 1.5*    Medications: Scheduled Meds: . docusate sodium  100 mg Oral BID  . enoxaparin (LOVENOX) injection  40 mg Subcutaneous Q24H  . losartan  100 mg Oral Daily  . oxybutynin  10 mg Oral Daily  . pantoprazole  40 mg Oral Q0600  . sertraline  100 mg Oral Daily   Continuous Infusions: . lactated ringers 125 mL/hr at 01/23/20 2309   PRN Meds:.acetaminophen **OR** acetaminophen, albuterol, fentaNYL (SUBLIMAZE) injection, lactulose, ondansetron (ZOFRAN) IV, prochlorperazine, zolpidem    Assessment/Plan: 69 y.o. female with chronic recurrent acute pancreatitis  Was improving until this AM and then pain worsened transiently.  Her Tbili was up just a bit this morning, perhaps a sign of another CBD stone?  No need for further  testing just now but I will order repeat LFTs for the morning.  She feels OK to try full liquids this morning and knows she needs to ambulate in the halls.   Will continue to follow.  Milus Banister, MD  01/24/2020, 7:31 AM [facility] Pager [phone]

## 2020-01-24 NOTE — Progress Notes (Signed)
[facility]                                                                              Patient Demographics  Linda Blackburn, is a 69 y.o. female, DOB - [birth year], [id number]  Admit date - 01/21/2020   Admitting Physician Alessandra Bevels, MD  Outpatient Primary MD for the patient is [name], MD  Outpatient specialists:   LOS - 3  days   Medical records reviewed and are as summarized below:    Chief Complaint  Patient presents with  . Abdominal Pain  . Nausea       Brief summary   69 y.o.femalewith history h/oanxiety, depression, hypertension, cholelithiasis s/p cholecystectomy, recurrent acute pancreatitis of unknown etiology (patient has had extensive work-up at [facility] and other tertiary centers with negative work-up) presents with complaints of abdominal pain that started this morning around 9 AM. Patient denies any precipitating factors. She states she had typical symptoms associated with prior pancreatitis flares-epigastric/mid abdominal pain 10/10, radiating to the back associated with nausea. Denies any diarrhea or vomiting. ED work-up: Afebrile, heart rate 50-70, blood pressure systolic 1 20-1 30, O2 sat 97to 100% on room air. WBC 11.3, hemoglobin 15.8, hematocrit 48.8, sodium 139, potassium 4.3, chloride 107, BUN 16, creatinine 0.6, calcium 8.9, LFTs within normal limits, T bili 1.0, lipase 3350. CT abdomen with mild pancreatitis, prior cholecystectomy and residual pneumobilia. Patient received IV fentanyl 100 mcg x 2 doses in the ED Patient admitted for further evaluation and management.   Assessment & Plan    Principal Problem:   Acute recurrent pancreatitis -Idiopathic, extensive work-up in the past at [facility], [facility], declined Whipple procedure, status post cholecystectomy 2001  -CT abdomen did not show any necrotic or hemorrhagic features, lipase 3350 at the time of admission.  Placed on n.p.o. status, IV fluids  and pain control.  GI consulted -Lipase 64 this morning.  States was feeling better yesterday however overnight had a transient severe left upper quadrant pain radiating to the back possibly CBD stone, now improving -Does not want clear liquid diet, states " the fat in the broth" always flares it up.  Placed on full liquid diet, advance slowly.  Continue IV fluids. -GI following, appreciate recommendations   Active Problems: Hypokalemia -Replaced  Anxiety/depression -Continue Zoloft  GERD Continue PPI    Obesity (BMI 30-39.9) Estimated body mass index is 37.79 kg/m as calculated from the following:   Height as of this encounter: 5\' 1"  (1.549 m).   Weight as of this encounter: 90.7 kg.  Code Status: DNR DVT Prophylaxis:  Lovenox Family Communication: Discussed all imaging results, lab results, explained to the patient   Disposition Plan:     Status is: Inpatient  Remains inpatient appropriate because:IV treatments appropriate due to intensity of illness or inability to take PO   Dispo: The patient is from: Home              Anticipated d/c is to: Home              Anticipated d/c date is: 2 days  Patient currently is not medically stable to d/c.       Time Spent in minutes   25 minutes  Procedures:  None  Consultants:   Gastroenterology  Antimicrobials:   Anti-infectives (From admission, onward)   None          Medications  Scheduled Meds: . docusate sodium  100 mg Oral BID  . enoxaparin (LOVENOX) injection  40 mg Subcutaneous Q24H  . losartan  100 mg Oral Daily  . oxybutynin  10 mg Oral Daily  . pantoprazole  40 mg Oral Q0600  . sertraline  100 mg Oral Daily   Continuous Infusions: . lactated ringers 75 mL/hr at 01/24/20 0919   PRN Meds:.acetaminophen **OR** acetaminophen, albuterol, fentaNYL (SUBLIMAZE) injection, lactulose, ondansetron (ZOFRAN) IV, prochlorperazine, zolpidem      Subjective:   Devinn Steenberg was seen and  examined today.  States had started to feel better yesterday evening but overnight had transient severe abdominal pain which is now improving.  No vomiting, fevers or chills.  Had a BM this morning.    Objective:   Vitals:   01/23/20 0905 01/23/20 1427 01/23/20 2140 01/24/20 0525  BP: 139/76 (!) 155/69 (!) 164/80 130/70  Pulse: 80 75 68 63  Resp:   17 17  Temp:  98 F (36.7 C) 98.2 F (36.8 C) 97.9 F (36.6 C)  TempSrc:      SpO2: 93% 95% 95% 92%  Weight:      Height:        Intake/Output Summary (Last 24 hours) at 01/24/2020 1216 Last data filed at 01/24/2020 6440 Gross per 24 hour  Intake 1848.65 ml  Output 2450 ml  Net -601.35 ml     Wt Readings from Last 3 Encounters:  01/21/20 90.7 kg  10/14/19 97.7 kg  10/14/19 98 kg     Exam  General: Alert and oriented x 3, NAD  Cardiovascular: S1 S2 auscultated, no murmurs, RRR  Respiratory: Clear to auscultation bilaterally, no wheezing, rales or rhonchi  Gastrointestinal: Soft, mild diffuse TTP, ND, NBS  Ext: no pedal edema bilaterally  Neuro: No new deficits  Musculoskeletal: No digital cyanosis, clubbing  Skin: No rashes  Psych: Normal affect and demeanor, alert and oriented x3    Data Reviewed:  I have personally reviewed following labs and imaging studies  Micro Results Recent Results (from the past 240 hour(s))  SARS Coronavirus 2 by RT PCR (hospital order, performed in [facility] hospital lab) Nasopharyngeal Nasopharyngeal Swab     Status: None   Collection Time: 01/21/20  3:06 PM   Specimen: Nasopharyngeal Swab  Result Value Ref Range Status   SARS Coronavirus 2 NEGATIVE NEGATIVE Final    Comment: (NOTE) SARS-CoV-2 target nucleic acids are NOT DETECTED. The SARS-CoV-2 RNA is generally detectable in upper and lower respiratory specimens during the acute phase of infection. The lowest concentration of SARS-CoV-2 viral copies this assay can detect is 250 copies / mL. A negative result does not  preclude SARS-CoV-2 infection and should not be used as the sole basis for treatment or other patient management decisions.  A negative result may occur with improper specimen collection / handling, submission of specimen other than nasopharyngeal swab, presence of viral mutation(s) within the areas targeted by this assay, and inadequate number of viral copies (<250 copies / mL). A negative result must be combined with clinical observations, patient history, and epidemiological information. Fact Sheet for Patients:   BoilerBrush.com.cy Fact Sheet for Healthcare Providers: https://pope.com/ This test is  not yet approved or cleared  by the [place] and has been authorized for detection and/or diagnosis of SARS-CoV-2 by FDA under an Emergency Use Authorization (EUA).  This EUA will remain in effect (meaning this test can be used) for the duration of the COVID-19 declaration under Section 564(b)(1) of the Act, 21 U.S.C. section 360bbb-3(b)(1), unless the authorization is terminated or revoked sooner. Performed at [facility], [address], [place], [place] [address]     Radiology Reports CT ABDOMEN PELVIS W CONTRAST  Result Date: 01/21/2020 CLINICAL DATA:  Mid abdominal pain. EXAM: CT ABDOMEN AND PELVIS WITH CONTRAST TECHNIQUE: Multidetector CT imaging of the abdomen and pelvis was performed using the standard protocol following bolus administration of intravenous contrast. CONTRAST:  OMNIPAQUE IOHEXOL 300 MG/ML  SOLN COMPARISON:  May 27, 2019 FINDINGS: Lower chest: Very mild atelectasis is seen within the bilateral lung bases. Hepatobiliary: No focal liver abnormality is seen. Mild pneumobilia is noted. Status post cholecystectomy. No biliary dilatation. Pancreas: There is mild peripancreatic inflammatory fat stranding. Spleen: Normal in size without focal abnormality. Adrenals/Urinary Tract: Adrenal  glands are unremarkable. Kidneys are normal, without renal calculi, focal lesion, or hydronephrosis. Bladder is unremarkable. Stomach/Bowel: Stomach is within normal limits. The appendix is not clearly identified. No evidence of bowel wall thickening, distention, or inflammatory changes. Noninflamed diverticula are seen throughout the large bowel. Vascular/Lymphatic: No significant vascular findings are present. No enlarged abdominal or pelvic lymph nodes. Reproductive: Status post hysterectomy. No adnexal masses. Other: No abdominal wall hernia or abnormality. No abdominopelvic ascites. Musculoskeletal: No acute or significant osseous findings. IMPRESSION: 1. Findings consistent with mild acute pancreatitis. 2. Colonic diverticulosis. 3. Evidence of prior cholecystectomy with mild subsequent pneumobilia. Electronically Signed   By: Aram Candela M.D.   On: 01/21/2020 15:24    Lab Data:  CBC: Recent Labs  Lab 01/21/20 1340 01/22/20 0404  WBC 11.3* 10.0  NEUTROABS 9.0*  --   HGB 15.8* 13.2  HCT 48.8* 41.9  MCV 91.0 92.5  PLT 231 189   Basic Metabolic Panel: Recent Labs  Lab 01/21/20 1320 01/22/20 0404 01/24/20 0332  NA 139 137 138  K 4.3 3.5 3.2*  CL 107 106 107  CO2 24 25 25   GLUCOSE 109* 92 60*  BUN 16 12 10   CREATININE 0.60 0.64 0.56  CALCIUM 8.9 8.3* 8.4*   GFR: Estimated Creatinine Clearance: 66.2 mL/min (by C-G formula based on SCr of 0.56 mg/dL). Liver Function Tests: Recent Labs  Lab 01/21/20 1320 01/24/20 0332  AST 28 19  ALT 25 16  ALKPHOS 96 74  BILITOT 1.0 1.5*  PROT 7.2 6.1*  ALBUMIN 4.1 3.3*   Recent Labs  Lab 01/21/20 1320 01/22/20 0404 01/24/20 0332  LIPASE 3,350* 2,568* 64*   No results for input(s): AMMONIA in the last 168 hours. Coagulation Profile: No results for input(s): INR, PROTIME in the last 168 hours. Cardiac Enzymes: No results for input(s): CKTOTAL, CKMB, CKMBINDEX, TROPONINI in the last 168 hours. BNP (last 3 results) No  results for input(s): PROBNP in the last 8760 hours. HbA1C: No results for input(s): HGBA1C in the last 72 hours. CBG: No results for input(s): GLUCAP in the last 168 hours. Lipid Profile: Recent Labs    01/24/20 0332  CHOL 154  HDL 35*  LDLCALC 98  TRIG 621  CHOLHDL 4.4   Thyroid Function Tests: No results for input(s): TSH, T4TOTAL, FREET4, T3FREE, THYROIDAB in the last 72 hours. Anemia Panel: No results for  input(s): VITAMINB12, FOLATE, FERRITIN, TIBC, IRON, RETICCTPCT in the last 72 hours. Urine analysis:    Component Value Date/Time   COLORURINE STRAW (A) 01/21/2020 1959   APPEARANCEUR CLEAR 01/21/2020 1959   LABSPEC 1.039 (H) 01/21/2020 1959   PHURINE 7.0 01/21/2020 1959   GLUCOSEU NEGATIVE 01/21/2020 1959   HGBUR NEGATIVE 01/21/2020 1959   BILIRUBINUR NEGATIVE 01/21/2020 1959   BILIRUBINUR Negative 10/14/2019 1529   KETONESUR NEGATIVE 01/21/2020 1959   PROTEINUR NEGATIVE 01/21/2020 1959   UROBILINOGEN 0.2 10/14/2019 1529   UROBILINOGEN 0.2 06/07/2014 0005   NITRITE NEGATIVE 01/21/2020 1959   LEUKOCYTESUR NEGATIVE 01/21/2020 1959     Darious Rehman M.D. [facility] 01/24/2020, 12:16 PM   Call night coverage person covering after 7pm

## 2020-01-24 NOTE — Progress Notes (Signed)
PT Cancellation Note  Patient Details Name: Linda Blackburn MRN: [id number] DOB: 1951-08-04   Cancelled Treatment:    Reason Eval/Treat Not Completed: PT screened, no needs identified, will sign off. Pt up to bathroom on her own. Pt reports she has been up amb in hallway without difficulty. No PT needs at this time    [facility] 01/24/2020, 4:02 PM

## 2020-01-25 DIAGNOSIS — K8500 Idiopathic acute pancreatitis without necrosis or infection: Principal | ICD-10-CM

## 2020-01-25 LAB — HEPATIC FUNCTION PANEL
ALT: 17 U/L (ref 0–44)
AST: 20 U/L (ref 15–41)
Albumin: 3.4 g/dL — ABNORMAL LOW (ref 3.5–5.0)
Alkaline Phosphatase: 76 U/L (ref 38–126)
Bilirubin, Direct: 0.2 mg/dL (ref 0.0–0.2)
Indirect Bilirubin: 1.1 mg/dL — ABNORMAL HIGH (ref 0.3–0.9)
Total Bilirubin: 1.3 mg/dL — ABNORMAL HIGH (ref 0.3–1.2)
Total Protein: 6.3 g/dL — ABNORMAL LOW (ref 6.5–8.1)

## 2020-01-25 LAB — BASIC METABOLIC PANEL
Anion gap: 9 (ref 5–15)
BUN: 7 mg/dL — ABNORMAL LOW (ref 8–23)
CO2: 25 mmol/L (ref 22–32)
Calcium: 8.7 mg/dL — ABNORMAL LOW (ref 8.9–10.3)
Chloride: 103 mmol/L (ref 98–111)
Creatinine, Ser: 0.61 mg/dL (ref 0.44–1.00)
GFR calc Af Amer: >60 mL/min (ref >60)
GFR calc non Af Amer: >60 mL/min (ref >60)
Glucose, Bld: 71 mg/dL (ref 70–99)
Potassium: 3.7 mmol/L (ref 3.5–5.1)
Sodium: 137 mmol/L (ref 135–145)

## 2020-01-25 LAB — LIPASE, BLOOD: Lipase: 29 U/L (ref 11–51)

## 2020-01-25 NOTE — Progress Notes (Signed)
[facility] Progress Note    Since last GI note:  Full liquids started yesterday. She tolerated this well. Abd pain much improved.  Repeat LFTs this AM show her Tbili is mainly indirect.  Objective: Vital signs in last 24 hours: Temp:  [98 F (36.7 C)-98.4 F (36.9 C)] 98.2 F (36.8 C) (05/23 0529) Pulse Rate:  [57-65] 57 (05/23 0529) Resp:  [17] 17 (05/23 0529) BP: (111-167)/(57-81) 167/81 (05/23 0529) SpO2:  [98 %-99 %] 98 % (05/23 0529) Last BM Date: 01/24/20 General: alert and oriented times 3 Heart: regular rate and rythm Abdomen: soft, non-tender, non-distended, normal bowel sounds   Lab Results: No results for input(s): WBC, HGB, PLT, MCV in the last 72 hours. Recent Labs    01/24/20 0332 01/25/20 0303  NA 138 137  K 3.2* 3.7  CL 107 103  CO2 25 25  GLUCOSE 60* 71  BUN 10 7*  CREATININE 0.56 0.61  CALCIUM 8.4* 8.7*   Recent Labs    01/24/20 0332 01/25/20 0303  PROT 6.1* 6.3*  ALBUMIN 3.3* 3.4*  AST 19 20  ALT 16 17  ALKPHOS 74 76  BILITOT 1.5* 1.3*  BILIDIR  --  0.2  IBILI  --  1.1*   Medications: Scheduled Meds: . docusate sodium  100 mg Oral BID  . enoxaparin (LOVENOX) injection  40 mg Subcutaneous Q24H  . losartan  100 mg Oral Daily  . oxybutynin  10 mg Oral Daily  . pantoprazole  40 mg Oral Q0600  . sertraline  100 mg Oral Daily   Continuous Infusions: . lactated ringers 75 mL/hr at 01/25/20 0121   PRN Meds:.acetaminophen **OR** acetaminophen, albuterol, fentaNYL (SUBLIMAZE) injection, lactulose, ondansetron (ZOFRAN) IV, prochlorperazine, zolpidem    Assessment/Plan: 69 y.o. female with resolving acute pancreatitis.  She is probably safe for d/c later today or in the morning as long as she tolerates solid foods.  My office will reach out to arrange follow up appt in the next few weeks.  Please call or page with any further questions or concerns.     Milus Banister, MD  01/25/2020, 8:20 AM [facility] Pager [phone]

## 2020-01-25 NOTE — Discharge Summary (Addendum)
Physician Discharge Summary   Patient ID: Linda Blackburn MRN: [id number] DOB/AGE: 04-21-1951 69 y.o.  Admit date: 01/21/2020 Discharge date: 01/25/2020  Primary Care Physician:  [name], MD   Recommendations for Outpatient Follow-up:  1. Follow up with PCP in 1-2 weeks 2. Patient recommended to follow-up with GI Dr. Ardis Hughs in 2 weeks  Home Health: None Equipment/Devices:   Discharge Condition: stable  CODE STATUS: FULL  Diet recommendation: Full liquid or low-fat diet for next few days   Discharge Diagnoses:   . Acute recurrent pancreatitis, unclear etiology, resolving . Leukocytosis . Hypokalemia . Adjustment disorder with mixed anxiety and depressed mood . Gastroesophageal reflux disease . Obesity (BMI 30-39.9)   Consults:  {Gastroenterology, Dr. Ardis Hughs   Allergies:   Allergies  Allergen Reactions  . Other Hives and Swelling    AQUASONIC [place] GEL  . Buprenorphine Hcl     "In a scarry movie" weird thoughts  . Codeine     GI upset  . Dilaudid [Hydromorphone Hcl] Itching  . Morphine And Related     "In a scarry movie" weird thoughts     DISCHARGE MEDICATIONS: Allergies as of 01/25/2020      Reactions   Other Hives, Swelling   AQUASONIC [place] GEL   Buprenorphine Hcl    "In a scarry movie" weird thoughts   Codeine    GI upset   Dilaudid [hydromorphone Hcl] Itching   Morphine And Related    "In a scarry movie" weird thoughts      Medication List    STOP taking these medications   ibuprofen 200 MG tablet Commonly known as: ADVIL     TAKE these medications   losartan 100 MG tablet Commonly known as: COZAAR Take 1 tablet (100 mg total) by mouth daily.   ondansetron 4 MG disintegrating tablet Commonly known as: ZOFRAN-ODT Take 1 tablet (4 mg total) by mouth every 8 (eight) hours as needed. What changed: reasons to take this   oxybutynin 10 MG 24 hr tablet Commonly known as: DITROPAN-XL Take 1 tablet (10 mg total) by mouth daily  after breakfast.   pantoprazole 40 MG tablet Commonly known as: PROTONIX Take 1 tablet (40 mg total) by mouth daily at 6 (six) AM.   sertraline 100 MG tablet Commonly known as: Zoloft Take 1 tablet (100 mg total) by mouth daily.   zolpidem 5 MG tablet Commonly known as: AMBIEN Take 1 tablet (5 mg total) by mouth at bedtime as needed for sleep.        Brief H and P: For complete details please refer to admission H and P, but in brief *69 y.o.femalewith history h/oanxiety, depression, hypertension, cholelithiasis s/p cholecystectomy, recurrent acute pancreatitis of unknown etiology (patient has had extensive work-up at [facility] and other tertiary centers with negative work-up) presents with complaints of abdominal pain that started this morning around 9 AM. Patient denies any precipitating factors. She states she had typical symptoms associated with prior pancreatitis flares-epigastric/mid abdominal pain 10/10, radiating to the back associated with nausea. Denies any diarrhea or vomiting. ED work-up: Afebrile, heart rate 50-70, blood pressure systolic 1 [id number], O2 sat 97to 100% on room air. WBC 11.3, hemoglobin 15.8, hematocrit 48.8, sodium 139, potassium 4.3, chloride 107, BUN 16, creatinine 0.6, calcium 8.9, LFTs within normal limits, T bili 1.0, lipase 3350. CT abdomen with mild pancreatitis, prior cholecystectomy and residual pneumobilia. Patient received IV fentanyl 100 mcg x 2 doses in the ED Patient admitted for further evaluation and  management  Hospital Course:   Acute recurrent pancreatitis, idiopathic -Idiopathic, extensive work-up in the past at [facility], [facility], declined Whipple procedure, status post cholecystectomy 2001  -CT abdomen did not show any necrotic or hemorrhagic features, lipase 3350 at the time of admission.  Based on n.p.o. status, IV fluids, pain control, GI was consulted. -Lipase 29 at the time of discharge.  On 5/22, patient had a transient  left upper quadrant pain radiating to the back, possibly could be CBD stone but now improved. -LFTs showed only mild elevation in the indirect bilirubin.  Triglycerides 103 Patient was placed on solid diet at the time of discharge, tolerating. Recommended to follow-up with GI in 2 weeks   Hypokalemia -Replaced  Anxiety/depression -Continue Zoloft  GERD Continue PPI    Obesity (BMI 30-39.9) Estimated body mass index is 37.79 kg/m as calculated from the following:   Height as of this encounter: 5\' 1"  (1.549 m).   Weight as of this encounter: 90.7 kg.    Day of Discharge S: Feels a lot better today, slept well, no abdominal pain, ambulating in the room without any difficulty, tolerating diet  BP (!) 167/81   Pulse (!) 57   Temp 98.2 F (36.8 C)   Resp 17   Ht 5\' 1"  (1.549 m)   Wt 90.7 kg   SpO2 98%   BMI 37.79 kg/m   Physical Exam: General: Alert and awake oriented x3 not in any acute distress. HEENT: anicteric sclera, pupils reactive to light and accommodation CVS: S1-S2 clear no murmur rubs or gallops Chest: clear to auscultation bilaterally, no wheezing rales or rhonchi Abdomen: soft nontender, nondistended, normal bowel sounds Extremities: no cyanosis, clubbing or edema noted bilaterally Neuro: Cranial nerves II-XII intact, no focal neurological deficits    Get Medicines reviewed and adjusted: Please take all your medications with you for your next visit with your Primary MD  Please request your Primary MD to go over all hospital tests and procedure/radiological results at the follow up. Please ask your Primary MD to get all Hospital records sent to his/her office.  If you experience worsening of your admission symptoms, develop shortness of breath, life threatening emergency, suicidal or homicidal thoughts you must seek medical attention immediately by calling 911 or calling your MD immediately  if symptoms less severe.  You must read complete  instructions/literature along with all the possible adverse reactions/side effects for all the Medicines you take and that have been prescribed to you. Take any new Medicines after you have completely understood and accept all the possible adverse reactions/side effects.   Do not drive when taking pain medications.   Do not take more than prescribed Pain, Sleep and Anxiety Medications  Special Instructions: If you have smoked or chewed Tobacco  in the last 2 yrs please stop smoking, stop any regular Alcohol  and or any Recreational drug use.  Wear Seat belts while driving.  Please note  You were cared for by a hospitalist during your hospital stay. Once you are discharged, your primary care physician will handle any further medical issues. Please note that NO REFILLS for any discharge medications will be authorized once you are discharged, as it is imperative that you return to your primary care physician (or establish a relationship with a primary care physician if you do not have one) for your aftercare needs so that they can reassess your need for medications and monitor your lab values.   The results of significant diagnostics from  this hospitalization (including imaging, microbiology, ancillary and laboratory) are listed below for reference.      Procedures/Studies:  CT ABDOMEN PELVIS W CONTRAST  Result Date: 01/21/2020 CLINICAL DATA:  Mid abdominal pain. EXAM: CT ABDOMEN AND PELVIS WITH CONTRAST TECHNIQUE: Multidetector CT imaging of the abdomen and pelvis was performed using the standard protocol following bolus administration of intravenous contrast. CONTRAST:  146mL OMNIPAQUE IOHEXOL 300 MG/ML  SOLN COMPARISON:  May 27, 2019 FINDINGS: Lower chest: Very mild atelectasis is seen within the bilateral lung bases. Hepatobiliary: No focal liver abnormality is seen. Mild pneumobilia is noted. Status post cholecystectomy. No biliary dilatation. Pancreas: There is mild peripancreatic  inflammatory fat stranding. Spleen: Normal in size without focal abnormality. Adrenals/Urinary Tract: Adrenal glands are unremarkable. Kidneys are normal, without renal calculi, focal lesion, or hydronephrosis. Bladder is unremarkable. Stomach/Bowel: Stomach is within normal limits. The appendix is not clearly identified. No evidence of bowel wall thickening, distention, or inflammatory changes. Noninflamed diverticula are seen throughout the large bowel. Vascular/Lymphatic: No significant vascular findings are present. No enlarged abdominal or pelvic lymph nodes. Reproductive: Status post hysterectomy. No adnexal masses. Other: No abdominal wall hernia or abnormality. No abdominopelvic ascites. Musculoskeletal: No acute or significant osseous findings. IMPRESSION: 1. Findings consistent with mild acute pancreatitis. 2. Colonic diverticulosis. 3. Evidence of prior cholecystectomy with mild subsequent pneumobilia. Electronically Signed   By: Virgina Norfolk M.D.   On: 01/21/2020 15:24       LAB RESULTS: Basic Metabolic Panel: Recent Labs  Lab 01/24/20 0332 01/25/20 0303  NA 138 137  K 3.2* 3.7  CL 107 103  CO2 25 25  GLUCOSE 60* 71  BUN 10 7*  CREATININE 0.56 0.61  CALCIUM 8.4* 8.7*   Liver Function Tests: Recent Labs  Lab 01/24/20 0332 01/25/20 0303  AST 19 20  ALT 16 17  ALKPHOS 74 76  BILITOT 1.5* 1.3*  PROT 6.1* 6.3*  ALBUMIN 3.3* 3.4*   Recent Labs  Lab 01/24/20 0332 01/25/20 0303  LIPASE 64* 29   No results for input(s): AMMONIA in the last 168 hours. CBC: Recent Labs  Lab 01/21/20 1340 01/21/20 1340 01/22/20 0404  WBC 11.3*  --  10.0  NEUTROABS 9.0*  --   --   HGB 15.8*  --  13.2  HCT 48.8*  --  41.9  MCV 91.0   < > 92.5  PLT 231  --  189   < > = values in this interval not displayed.   Cardiac Enzymes: No results for input(s): CKTOTAL, CKMB, CKMBINDEX, TROPONINI in the last 168 hours. BNP: Invalid input(s): POCBNP CBG: No results for input(s):  GLUCAP in the last 168 hours.     Disposition and Follow-up: Discharge Instructions    Diet - low sodium heart healthy   Complete by: As directed    Discharge instructions   Complete by: As directed    Please continue Full liquids or soft low fat diet for next 3-4 days   Increase activity slowly   Complete by: As directed        DISPOSITION: Home   DISCHARGE FOLLOW-UP Follow-up Information    Milus Banister, MD. Schedule an appointment as soon as possible for a visit in 2 week(s).   Specialty: Gastroenterology Contact information: [address] [place] [address] [phone]        [name], MD. Schedule an appointment as soon as possible for a visit in 2 week(s).   Specialty: Family Medicine Contact information:  [place] [address] [phone]            Time coordinating discharge:  35 minutes  Signed:   Estill Cotta M.D. [facility] 01/25/2020, 2:03 PM

## 2020-01-25 NOTE — TOC Transition Note (Signed)
Transition of Care [facility]) - CM/SW Discharge Note   Patient Details  Name: Linda Blackburn MRN: [id number] Date of Birth: [birth year]  Transition of Care [facility]) CM/SW Contact:  Greg Cutter, LCSW Phone Number: 01/25/2020, 9:41 AM   Clinical Narrative:   LCSW spoke with patient's attending physician on 01/25/20 regarding social work order. Patient is in need of transportation assistance today as her friend is unable to pick her up from the hospital and both of her sons live out of town. LCSW contacted patient and confirmed her lack of stable transportation. Patient is unable to find any other source of transportation in order to get home. LCSW completed Taxi Voucher per attending physician's request. LCSW left completed Taxi Voucher in patient's chart and updated patient's hall nurse.   Readmission Risk Interventions No flowsheet data found.   [name], BSW, MSW, LCSW [email or web address]

## 2020-01-26 ENCOUNTER — Telehealth: Payer: Self-pay

## 2020-01-26 NOTE — Telephone Encounter (Signed)
-----   Message from Milus Banister, MD sent at 01/25/2020  8:40 AM EDT ----- She needs OV with me, first availabe for hosp follow up . Thanks

## 2020-01-26 NOTE — Telephone Encounter (Signed)
Follow up scheduled with the patient for 03/31/20

## 2020-02-03 DIAGNOSIS — R350 Frequency of micturition: Secondary | ICD-10-CM | POA: Diagnosis not present

## 2020-02-03 DIAGNOSIS — N3946 Mixed incontinence: Secondary | ICD-10-CM | POA: Diagnosis not present

## 2020-02-06 ENCOUNTER — Encounter: Payer: Self-pay | Admitting: Family Medicine

## 2020-02-06 ENCOUNTER — Other Ambulatory Visit: Payer: Self-pay

## 2020-02-06 ENCOUNTER — Ambulatory Visit (INDEPENDENT_AMBULATORY_CARE_PROVIDER_SITE_OTHER): Payer: Medicare Other | Admitting: Family Medicine

## 2020-02-06 VITALS — BP 122/86 | HR 82 | Temp 98.0°F | Wt 217.6 lb

## 2020-02-06 DIAGNOSIS — K859 Acute pancreatitis without necrosis or infection, unspecified: Secondary | ICD-10-CM | POA: Diagnosis not present

## 2020-02-06 DIAGNOSIS — R059 Cough, unspecified: Secondary | ICD-10-CM

## 2020-02-06 DIAGNOSIS — R3915 Urgency of urination: Secondary | ICD-10-CM | POA: Diagnosis not present

## 2020-02-06 DIAGNOSIS — G8929 Other chronic pain: Secondary | ICD-10-CM

## 2020-02-06 DIAGNOSIS — F5104 Psychophysiologic insomnia: Secondary | ICD-10-CM

## 2020-02-06 DIAGNOSIS — R05 Cough: Secondary | ICD-10-CM

## 2020-02-06 DIAGNOSIS — M25561 Pain in right knee: Secondary | ICD-10-CM

## 2020-02-06 MED ORDER — MIRABEGRON ER 50 MG PO TB24: 50.0000 mg | ORAL_TABLET | Freq: Every day | ORAL | 3 refills | Status: DC

## 2020-02-06 MED ORDER — ZOLPIDEM TARTRATE 5 MG PO TABS: 5.0000 mg | ORAL_TABLET | Freq: Every evening | ORAL | 0 refills | Status: DC | PRN

## 2020-02-06 NOTE — Patient Instructions (Signed)
I will set up [place] social worker consult  I will set up sports medicine consult regarding right knee pain.   Refills of Mybetriq sent to [facility]

## 2020-02-06 NOTE — Progress Notes (Signed)
Subjective:     Patient ID: Linda Blackburn, female   DOB: 1951-01-14, 69 y.o.   MRN: [id number]  HPI Linda Blackburn seen for hospital follow-up.  She has longstanding history of recurrent idiopathic pancreatitis.  She had symptoms typical of her usual flare with fever, epigastric abdominal pain, nausea and vomiting and was admitted on May 19 with discharge on the 23rd.  She had extensive GI work-up in the past and has seen someone up in [place] with expertise in pancreatitis but they did not have anything else to offer.  She still has some residual epigastric pain at this time but is eating better.  She states she had a worse day yesterday but feels improved today.  No nausea or vomiting.  Recent lipase was over 3000.  She has had some chronic intermittent problems with insomnia.  She has taken very infrequent Ambien in the past and requesting refill.  She relates some cough with liquids especially with inconsistent symptoms.  No solid food dysphagia.  No dyspnea.  Longstanding history of urinary urgency.  She has seen urologist and was placed on Myrbetriq.  She needs refills to be sent through her [facility] and was requesting we could send those today.  Right knee pain for several years.  Denies any injury.  Progressive over time.  Mostly medial.  She had a couple of occasions where she felt like her knee was locking up.  She had more severe pain last week to the extent that she had difficulty weightbearing weight for a couple days but better now.  Past Medical History:  Diagnosis Date  . Abnormal uterine bleeding   . Amenorrhea   . Anxiety   . Depression   . Gallstones   . Hay fever   . HTN (hypertension)   . Pancreatitis   . Urine incontinence   . UTI (urinary tract infection)    reoccuring    Past Surgical History:  Procedure Laterality Date  . ABDOMINAL HYSTERECTOMY  1984   fibroids  . APPENDECTOMY  1984  . BILIARY DILATION  05/29/2019   Procedure: BILIARY DILATION;  Surgeon:  Rush Landmark Telford Nab., MD;  Location: [facility];  Service: Gastroenterology;;  . BIOPSY  05/29/2019   Procedure: BIOPSY;  Surgeon: Irving Copas., MD;  Location: [facility];  Service: Gastroenterology;;  . [name]  09/08/1999  . ERCP N/A 05/29/2019   Procedure: ENDOSCOPIC RETROGRADE CHOLANGIOPANCREATOGRAPHY (ERCP);  Surgeon: Irving Copas., MD;  Location: [facility];  Service: Gastroenterology;  Laterality: N/A;  . ERCP W/ SPHINCTEROTOMY AND BALLOON DILATION  08/2008  . ESOPHAGOGASTRODUODENOSCOPY (EGD) WITH PROPOFOL N/A 05/29/2019   Procedure: ESOPHAGOGASTRODUODENOSCOPY (EGD) WITH PROPOFOL;  Surgeon: Rush Landmark Telford Nab., MD;  Location: [facility];  Service: Gastroenterology;  Laterality: N/A;  . OOPHORECTOMY     Only 1 was taken  . POLYPECTOMY  05/29/2019   Procedure: POLYPECTOMY;  Surgeon: Mansouraty, Telford Nab., MD;  Location: [facility];  Service: Gastroenterology;;  . REMOVAL OF STONES  05/29/2019   Procedure: REMOVAL OF STONES;  Surgeon: Irving Copas., MD;  Location: [facility];  Service: Gastroenterology;;  . TONSILLECTOMY    . TUBAL LIGATION      reports that she has never smoked. She has never used smokeless tobacco. She reports current alcohol use. She reports that she does not use drugs. family history includes Alcohol abuse in her mother; Cerebral palsy in her granddaughter; Hiatal hernia in her maternal aunt; Prostate cancer in her father and paternal grandfather; Stroke in her maternal  aunt. Allergies  Allergen Reactions  . Other Hives and Swelling    AQUASONIC [place] GEL  . Buprenorphine Hcl     "In a scarry movie" weird thoughts  . Codeine     GI upset  . Dilaudid [Hydromorphone Hcl] Itching  . Morphine And Related     "In a scarry movie" weird thoughts     Review of Systems  Constitutional: Negative for chills and fever.  Respiratory: Negative for cough and shortness of breath.   Cardiovascular: Negative for chest pain.   Gastrointestinal: Positive for abdominal pain. Negative for diarrhea, nausea and vomiting.  Genitourinary: Negative for dysuria.  Neurological: Negative for weakness.  Hematological: Negative for adenopathy.       Objective:   Physical Exam Vitals reviewed.  Constitutional:      Appearance: Normal appearance.  Cardiovascular:     Rate and Rhythm: Normal rate and regular rhythm.  Pulmonary:     Effort: Pulmonary effort is normal.     Breath sounds: Normal breath sounds.  Abdominal:     General: Bowel sounds are normal. There is no distension.     Palpations: Abdomen is soft.     Tenderness: There is abdominal tenderness.     Comments: She still has some tenderness epigastric area.  No mass.   Neurological:     Mental Status: She is alert.        Assessment:     #1 transient insomnia-patient on infrequent Ambien and requesting refills  #2 history of recurrent idiopathic pancreatitis with recent admission as above.  Still has some residual epigastric pain but overall improved.  She has pending follow up with GI.    #3 chronic urinary urgency-just started on Myrbetriq and requesting refills  #4 history of cough with liquids.  Denies any solid food dysphagia.  Does have history of chronic GERD and is on chronic PPI  #5 chronic right knee pain progressive over the past couple years      Plan:     -Refilled Myrbetriq for 1 year with prescription sent to [facility]  -We will set up [id number] social consult to help with transportation needs in the future  -Set up her sports medicine referral regarding her progressive and chronic right knee pain  -Refilled Ambien for as needed use.  She uses this infrequently  -We recommended she try to keep her head in neutral or for tilt position when drinking liquids.  If she has any progressive symptoms consider barium swallow with speech and language pathology  [name] MD [facility]

## 2020-02-09 ENCOUNTER — Other Ambulatory Visit: Payer: Self-pay | Admitting: *Deleted

## 2020-02-09 NOTE — Patient Outreach (Signed)
[facility]) Care Management  02/09/2020  Chessica Audia Mar 14, 1951 [id number]  Telephone Assessment-Unsuccessful  RN attempted outreach call today however unsuccessful. RN able to leave a HIPAA approved voice message requesting a call back. Will further engage at that time.  Plan: Will schedule another outreach call next week.  Raina Mina, RN Care Management Coordinator [place] Office [phone]

## 2020-02-13 ENCOUNTER — Encounter: Payer: Medicare Other | Admitting: Gastroenterology

## 2020-02-16 ENCOUNTER — Other Ambulatory Visit: Payer: Self-pay | Admitting: *Deleted

## 2020-02-16 NOTE — Patient Outreach (Signed)
[facility]) Care Management  02/16/2020  Rubena Roseman June 16, 1951 [id number]   Telephone Assessment  Pt returned call based upon RN HIPAA voice message left earlier today. Introduced [facility] services and inquired further on pt's needs. Pt state she is trying to make arranges for possible transportation sources for her ongoing medication providers and test/procedures that are scheduled. States she has 2 sons however both are out of the states with minimal available. Pt does not have any other sources of assistance at this time. Pt needs other sources of transportation possible adie/sitting assistance that is able to stay with her during certain procedures such a colonoscopy procedures that may require her not to drive and have someone with her after the procedure. Offered to make a referral for [facility] social worker for the needed assistance (pt receptive). RN further engaged with enrollment into the [facility] program for case management services and further discussed level of care, safety in the home along with prevention measures that maybe able to assist further. Also inquired on pt's HTN history as pt states she has never had Hypertension and this condition is well managed with no acute needs.  Pt denies any other needs at this time indicated she was just seeking resources to assist with transportation.   Based upon the above request will offer to make a referral once again to the [facility] social worker to assist pt further with her current needs. Will alert primary provider of pt's disposition with [facility] services and sign off on the discipline with no additional needs.  Raina Mina, RN Care Management Coordinator [place] Office [phone]

## 2020-02-16 NOTE — Patient Outreach (Signed)
[facility]) Care Management  02/16/2020  Linda Blackburn Sep 14, 1950 [id number]   Telephone Assessment-Unsuccessful (2nd outreach)  RN attempted another outreach however remains unsuccessful. RN able to leave a HIPAA approved voice message requesting a call back. Will further pursue at that time.  Plan: Will follow up with another outreach call next week to further engage on pt's needs.  Raina Mina, RN Care Management Coordinator [place] Office [phone]

## 2020-02-18 ENCOUNTER — Other Ambulatory Visit: Payer: Self-pay | Admitting: *Deleted

## 2020-02-18 NOTE — Patient Outreach (Signed)
[place] [facility]) Care Management  02/18/2020  Sanam Marmo November 29, 1950 [id number]   CSW received referral on 02/16/2020 for assistance with transportation and caregiver needs. CSW attempted to make initial contact with pt and was unsuccessful but was able to leave a HIPPA compliant voice mail message.   CSW will send pt an unsuccessful outreach letter and attempt again in 3-4 business days if no return call is received.   Eduard Clos, MSW, [place] Worker  [place] [phone]

## 2020-02-20 ENCOUNTER — Other Ambulatory Visit: Payer: Self-pay | Admitting: *Deleted

## 2020-02-20 NOTE — Patient Outreach (Signed)
[facility] Care Management   

## 2020-02-23 ENCOUNTER — Other Ambulatory Visit: Payer: Self-pay | Admitting: *Deleted

## 2020-02-23 ENCOUNTER — Encounter: Payer: Self-pay | Admitting: *Deleted

## 2020-02-23 NOTE — Patient Outreach (Addendum)
[facility]) Care Management  02/23/2020  Linda Blackburn 01/13/1951 [id number]   CSW received a callback from pt returning [place] call and outreach letter.  Pt identity confirmed.  Pt reports she has been a widow for 11 years- lives alone and has chronic pancreatitis that can cause unexpected periods of time when she is in need of caregiver support.  Pt also shared with CSW that she has an upcoming eye surgery and possible knee surgery in the future and is looking to be prepared with transportation and caregiver assistance.   CSW provided pt with some community based resources and will plan to mail her additional options.   Although pt does have a supportive network of neighbors/friends and family (sons live out of state), she is wanting to have options for services for when/if she needs more assistance than she is comfortable asking friends to assist.   Pt also reports having a long term care insurance policy- CSW encouraged pt to review her policy coverage so she is familiar with the benefits/waiting period, etc.   CSW will mail pt resource material and plan to follow up by phone for further discussion of needs.   Eduard Clos, MSW, [place] Worker  [place] [phone]

## 2020-02-23 NOTE — Telephone Encounter (Signed)
This encounter was created in error - please disregard.

## 2020-02-24 NOTE — Patient Outreach (Deleted)
[facility]) Care Management   02/24/2020  Linda Blackburn 29-Apr-1951 [id number]  Linda Blackburn is an 69 y.o. female  Subjective:   Objective:   Review of Systems  Endocrine: Positive for cold intolerance.    Physical Exam  Encounter Medications:   Outpatient Encounter Medications as of 02/20/2020  Medication Sig  . losartan (COZAAR) 100 MG tablet Take 1 tablet (100 mg total) by mouth daily.  . mirabegron ER (MYRBETRIQ) 50 MG TB24 tablet Take 1 tablet (50 mg total) by mouth daily.  . ondansetron (ZOFRAN-ODT) 4 MG disintegrating tablet Take 1 tablet (4 mg total) by mouth every 8 (eight) hours as needed. (Patient taking differently: Take 4 mg by mouth every 8 (eight) hours as needed for nausea or vomiting. )  . oxybutynin (DITROPAN-XL) 10 MG 24 hr tablet Take 1 tablet (10 mg total) by mouth daily after breakfast.  . pantoprazole (PROTONIX) 40 MG tablet Take 1 tablet (40 mg total) by mouth daily at 6 (six) AM.  . sertraline (ZOLOFT) 100 MG tablet Take 1 tablet (100 mg total) by mouth daily.  Marland Kitchen zolpidem (AMBIEN) 5 MG tablet Take 1 tablet (5 mg total) by mouth at bedtime as needed for sleep.   No facility-administered encounter medications on file as of 02/20/2020.    Functional Status:   In your present state of health, do you have any difficulty performing the following activities: 01/21/2020 10/14/2019  Hearing? N N  Vision? N N  Difficulty concentrating or making decisions? N N  Walking or climbing stairs? Y N  Comment gets short of breath -  Dressing or bathing? N N  Doing errands, shopping? N N  Preparing Food and eating ? - N  Using the Toilet? - N  In the past six months, have you accidently leaked urine? - Y  Do you have problems with loss of bowel control? - N  Managing your Medications? - N  Managing your Finances? - N  Housekeeping or managing your Housekeeping? - N  Some recent data might be hidden    Fall/Depression Screening:     Fall Risk  10/14/2019 04/03/2019 10/11/2018  Falls in the past year? 0 (No Data) 0  Comment - Emmi Telephone Survey: data to providers prior to load -  Number falls in past yr: - (No Data) -  Comment - Emmi Telephone Survey Actual Response =  -  Risk for fall due to : Medication side effect - -  Follow up Falls evaluation completed;Education provided;Falls prevention discussed - -   PHQ 2/9 Scores 10/14/2019 10/11/2018 08/10/2017 08/07/2016 07/22/2015 06/16/2014  PHQ - 2 Score 1 0 0 0 0 0  PHQ- 9 Score - 2 - - - -    Assessment:    Plan:

## 2020-02-25 ENCOUNTER — Other Ambulatory Visit: Payer: Self-pay

## 2020-02-25 ENCOUNTER — Ambulatory Visit (INDEPENDENT_AMBULATORY_CARE_PROVIDER_SITE_OTHER): Payer: Medicare Other

## 2020-02-25 ENCOUNTER — Telehealth: Payer: Self-pay | Admitting: Family Medicine

## 2020-02-25 ENCOUNTER — Encounter: Payer: Self-pay | Admitting: Family Medicine

## 2020-02-25 ENCOUNTER — Ambulatory Visit: Payer: Self-pay

## 2020-02-25 ENCOUNTER — Ambulatory Visit (INDEPENDENT_AMBULATORY_CARE_PROVIDER_SITE_OTHER): Payer: Medicare Other | Admitting: Family Medicine

## 2020-02-25 VITALS — BP 110/76 | HR 88 | Ht 61.0 in | Wt 217.4 lb

## 2020-02-25 DIAGNOSIS — M7651 Patellar tendinitis, right knee: Secondary | ICD-10-CM

## 2020-02-25 DIAGNOSIS — M25561 Pain in right knee: Secondary | ICD-10-CM

## 2020-02-25 DIAGNOSIS — G8929 Other chronic pain: Secondary | ICD-10-CM

## 2020-02-25 NOTE — Progress Notes (Signed)
Subjective:    CC: R knee pain  I, Molly Weber, LAT, ATC, am serving as scribe for Dr. Lynne Leader.  HPI: Pt is a 69 y/o female presenting w/ c/o chronic R anterior knee pain that has been worsening.  She was recently hospitalized for pancreatitis and had pain in her knee when trying to get out of bed while at the hospital.  Pain is located anterior knee.  She notes she has a history of skin allergy to aqua sonic ultrasound gel  Radiating pain: no R knee swelling: yes R knee mechanical symptoms: yes Aggravating factors: walking; stepping down onto her R LE; descending stairs; pressure on the knee Treatments tried: Elevation  Pertinent review of Systems: No fevers or chills  Relevant historical information: History of chronic pancreatitis idiopathic   Objective:    Vitals:   02/25/20 1246  BP: 110/76  Pulse: 88  SpO2: 97%   General: Well Developed, well nourished, and in no acute distress.   MSK: Right knee slightly swollen anterior knee otherwise normal-appearing Range of motion full with mild crepitation. Stable ligamentous exam. Negative Murray's test. Intact strength some pain with extension.   Lab and Radiology Results  Diagnostic Limited MSK Ultrasound of: Right knee I used [place] Lubricating jelly II for the ultrasound given her history of allergy to aqua sonic ultrasound gel. Quadricep tendon normal-appearing Small joint effusion present superior patellar space. Patellar tendon intact.  Calcific changes at distal tendon insertion on the tibia present indicating chronic distal patellar tendinopathy. Medial and lateral joint line slightly narrowed and degenerative appearing Posterior knee no significant Baker's cyst. Impression: Chronic calcific patellar tendinitis  X-ray images right knee obtained today personally and independently reviewed. Mild DJD most prominent at medial compartment and patellofemoral compartment. Calcific changes seen on ultrasound  not visible on x-ray at distal patellar tendon. No acute fractures. Await formal radiology review  Impression and Recommendations:    Assessment and Plan: 69 y.o. female with right anterior knee pain. Physical exam and ultrasound examination are more consistent with patellar tendinitis. She has several odd drug allergies and idiopathic pancreatitis.  I am reluctant to proceed with steroid injection in this context.  Discussing treatment plans and options.  We will try topical Voltaren gel and physical therapy first.  If not better would consider steroid injection into the right knee as this may be helpful.  Recheck back in about 4 weeks.  PDMP not reviewed this encounter. Orders Placed This Encounter  Procedures  . [place] LIMITED JOINT SPACE STRUCTURES LOW RIGHT(NO LINKED CHARGES)    Order Specific Question:   Reason for Exam (SYMPTOM  OR DIAGNOSIS REQUIRED)    Answer:   R knee pain    Order Specific Question:   Preferred imaging location?    Answer:   [place]  . DG Knee AP/LAT W/[place] Right    Standing Status:   Future    Number of Occurrences:   1    Standing Expiration Date:   03/26/2020    Order Specific Question:   Reason for Exam (SYMPTOM  OR DIAGNOSIS REQUIRED)    Answer:   R knee pain    Order Specific Question:   Preferred imaging location?    Answer:   [facility]  . Ambulatory referral to Physical Therapy    Referral Priority:   Routine    Referral Type:   Physical Medicine    Referral Reason:   Specialty Services Required  Requested Specialty:   Physical Therapy   No orders of the defined types were placed in this encounter.   Discussed warning signs or symptoms. Please see discharge instructions. Patient expresses understanding.   The above documentation has been reviewed and is accurate and complete Lynne Leader, M.D.

## 2020-02-25 NOTE — Patient Instructions (Addendum)
Thank you for coming in today. Plan for Physical Therapy.  Try using over the counter voltaren gel on the knee.  Wait at least 1 day to start so we can be more sure about what you may be allergic to if you break out.  I used [place] Lubricating jelly II for the ultrasound.  If you do get a localized rash ok to use hydrocortisone cream.   Get xray today.   Recheck with me in 4 weeks.  Return sooner if needed.     Patellar Tendinitis  Patellar tendinitis is also called jumper's knee or patellar tendinopathy. This condition happens when there is damage to and inflammation of the patellar tendon. Tendons are cord-like tissues that connect muscles to bones. The patellar tendon connects the bottom of the kneecap (patella) to the top of the shin bone (tibia). Patellar tendinitis causes pain in the front of the knee. The condition happens in the following stages:  Stage 1: In this stage, you have pain only after activity.  Stage 2: In this stage, you have pain during and after activity.  Stage 3: In this stage, you have pain at rest as well as during and after activity. The pain limits your ability to do the activity.  Stage 4: In this stage, the tendon tears and severely limits your activity. What are the causes? This condition is caused by repeated (repetitive) stress on the tendon. This stress may cause the tendon to stretch, swell, thicken, or tear. What increases the risk? The following factors may make you more likely to develop this condition:  Participating in sports that involve running, kicking, and jumping, especially on hard surfaces. These include: ? Basketball. ? Volleyball. ? Soccer. ? Track and field.  Training too hard.  Having tight thigh muscles.  Having received steroid injections in the tendon.  Having had knee surgery.  Being 48-22 years old.  Having rheumatoid arthritis, diabetes, or kidney disease. These conditions interrupt blood flow to the knee, causing  the tendon to weaken. What are the signs or symptoms? The main symptom of this condition is pain and swelling in the front of the knee. The pain usually starts slowly and gradually gets worse. It may become painful to straighten your leg. The pain may get worse when you walk, run, or jump. How is this diagnosed? This condition may be diagnosed based on:  Your symptoms.  Your medical history.  A physical exam. During the physical exam, your health care provider may check for: ? Tenderness in your patella. ? Tightness in your thigh muscles. ? Pain when you straighten your knee.  Imaging tests, including: ? X-rays. These will show the position and condition of your patella. ? An MRI. This will show any abnormality of the tendon. ? Ultrasound. This will show any swelling or other abnormalities of the tendon. How is this treated? Treatment for this condition depends on the stage of the condition. It may involve:  Avoiding activities that cause pain, such as jumping.  Icing and elevating your knee.  Having sound wave stimulation to promote healing.  Doing stretching and strengthening exercises (physical therapy) when pain and swelling improve.  Wearing a knee brace. This may be needed if your condition does not improve with treatment.  Using crutches or a walker. This may be needed if your condition does not improve with treatment.  Surgery. This may be done if you have stage 4 tendinitis. Follow these instructions at home: If you have a brace:  Wear  the brace as told by your health care provider. Remove it only as told by your health care provider.  Loosen the brace if your toes tingle, become numb, or turn cold and blue.  Keep the brace clean.  If the brace is not waterproof: ? Do not let it get wet. ? Cover it with a watertight covering when you take a bath or shower.  Ask your health care provider when it is safe for you to drive. Managing pain, stiffness, and  swelling   If directed, put ice on the injured area. ? If you have a removable brace, remove it as told by your health care provider. ? Put ice in a plastic bag. ? Place a towel between your skin and the bag. ? Leave the ice on for 20 minutes, 2-3 times a day.  Move your toes often to reduce stiffness and swelling.  Raise (elevate) your knee above the level of your heart while you are sitting or lying down. Activity  Do not use the injured limb to support your body weight until your health care provider says that you can. Use your crutches or a walker as told by your health care provider.  Return to your normal activities as told by your health care provider. Ask your health care provider what activities are safe for you.  Do exercises as told by your health care provider or physical therapist. General instructions  Take over-the-counter and prescription medicines only as told by your health care provider.  Do not use any products that contain nicotine or tobacco, such as cigarettes, e-cigarettes, and chewing tobacco. These can delay healing. If you need help quitting, ask your health care provider.  Keep all follow-up visits as told by your health care provider. This is important. How is this prevented?  Warm up and stretch before being active.  Cool down and stretch after being active.  Give your body time to rest between periods of activity. ? You may need to reduce how often you play a sport that requires frequent jumping.  Make sure to use equipment that fits you.  Be safe and responsible while being active. This will help you avoid falls which can damage the tendon.  Do at least 150 minutes of moderate-intensity exercise each week, such as brisk walking or water aerobics.  Maintain physical fitness, including: ? Strength. ? Flexibility. ? Cardiovascular fitness. ? Endurance. Contact a health care provider if:  Your symptoms have not improved in 6 weeks.  Your  symptoms get worse. Summary  Patellar tendinitis is also called jumper's knee or patellar tendinopathy. This condition happens when there is damage to and inflammation of the patellar tendon.  Treatment for this condition depends on the stage of the condition and may include rest, ice, exercises, medicines, and surgery.  Do not use the injured limb to support your body weight until your health care provider says that you can.  Take over-the-counter and prescription medicines only as told by your health care provider.  Keep all follow-up visits as told by your health care provider. This is important. This information is not intended to replace advice given to you by your health care provider. Make sure you discuss any questions you have with your health care provider. Document Revised: 12/12/2018 Document Reviewed: 07/15/2018 Elsevier Patient Education  [place].

## 2020-02-25 NOTE — Telephone Encounter (Signed)
LVM for patient to call back and schedule PT with Ander Purpura

## 2020-02-26 ENCOUNTER — Telehealth: Payer: Self-pay | Admitting: Family Medicine

## 2020-02-26 NOTE — Telephone Encounter (Signed)
Pt has been assigned to jury duty on 04/27/20 in [employer]. Pt is requesting a letter excusing her from jury duty because of her health. Pt has been in and out of the hospital and severe knee pain. Pt will also be starting physical therapy on 03/01/20. Pt's jury number is [id number]. Thanks

## 2020-02-27 NOTE — Progress Notes (Signed)
Very small amount of arthritis present in the right knee

## 2020-02-27 NOTE — Telephone Encounter (Signed)
Please advise 

## 2020-02-29 ENCOUNTER — Encounter: Payer: Self-pay | Admitting: Family Medicine

## 2020-02-29 NOTE — Telephone Encounter (Signed)
Letter done.  Under "letters" tab.

## 2020-03-01 ENCOUNTER — Ambulatory Visit (INDEPENDENT_AMBULATORY_CARE_PROVIDER_SITE_OTHER): Payer: Medicare Other | Admitting: Physical Therapy

## 2020-03-01 ENCOUNTER — Encounter: Payer: Self-pay | Admitting: Physical Therapy

## 2020-03-01 ENCOUNTER — Other Ambulatory Visit: Payer: Self-pay

## 2020-03-01 DIAGNOSIS — G8929 Other chronic pain: Secondary | ICD-10-CM | POA: Diagnosis not present

## 2020-03-01 DIAGNOSIS — M25561 Pain in right knee: Secondary | ICD-10-CM | POA: Diagnosis not present

## 2020-03-01 NOTE — Patient Instructions (Signed)
Access Code: [email or web address] URL: [email or web address] Date: 03/01/2020 Prepared by: Lyndee Hensen  Exercises Supine Quadricep Sets - 2 x daily - 2 sets - 10 reps - 5 hold Seated Knee Extension AROM - 2 x daily - 2 sets - 10 reps Seated Hamstring Stretch - 2 x daily - 3 reps - 30 hold Straight Leg Raise - 2 x daily - 2 sets - 10 reps

## 2020-03-01 NOTE — Telephone Encounter (Signed)
Pt informed that letter is up front to pick up

## 2020-03-03 ENCOUNTER — Other Ambulatory Visit: Payer: Self-pay

## 2020-03-03 ENCOUNTER — Encounter: Payer: Self-pay | Admitting: Physical Therapy

## 2020-03-03 ENCOUNTER — Ambulatory Visit (INDEPENDENT_AMBULATORY_CARE_PROVIDER_SITE_OTHER): Payer: Medicare Other | Admitting: Physical Therapy

## 2020-03-03 DIAGNOSIS — G8929 Other chronic pain: Secondary | ICD-10-CM | POA: Diagnosis not present

## 2020-03-03 DIAGNOSIS — M25561 Pain in right knee: Secondary | ICD-10-CM

## 2020-03-03 NOTE — Therapy (Signed)
[facility] [address] [place], [place], [address] Phone: [phone]   Fax:  [phone]  Physical Therapy Evaluation  Patient Details  Name: Linda Blackburn MRN: [id number] Date of Birth: June 13, 1951 Referring Provider (PT): Lynne Leader   Encounter Date: 03/01/2020   PT End of Session - 03/03/20 0920    Visit Number 1    Number of Visits 12    Date for PT Re-Evaluation 04/12/20    Authorization Type Medicare    PT Start Time 1350    PT Stop Time 1430    PT Time Calculation (min) 40 min    Activity Tolerance Patient tolerated treatment well    Behavior During Therapy [facility] for tasks assessed/performed           Past Medical History:  Diagnosis Date  . Abnormal uterine bleeding   . Amenorrhea   . Anxiety   . Depression   . Gallstones   . Hay fever   . HTN (hypertension)   . Pancreatitis   . Urine incontinence   . UTI (urinary tract infection)    reoccuring     Past Surgical History:  Procedure Laterality Date  . ABDOMINAL HYSTERECTOMY  1984   fibroids  . APPENDECTOMY  1984  . BILIARY DILATION  05/29/2019   Procedure: BILIARY DILATION;  Surgeon: Rush Landmark Telford Nab., MD;  Location: [facility];  Service: Gastroenterology;;  . BIOPSY  05/29/2019   Procedure: BIOPSY;  Surgeon: Irving Copas., MD;  Location: [facility];  Service: Gastroenterology;;  . [name]  09/08/1999  . ERCP N/A 05/29/2019   Procedure: ENDOSCOPIC RETROGRADE CHOLANGIOPANCREATOGRAPHY (ERCP);  Surgeon: Irving Copas., MD;  Location: [facility];  Service: Gastroenterology;  Laterality: N/A;  . ERCP W/ SPHINCTEROTOMY AND BALLOON DILATION  08/2008  . ESOPHAGOGASTRODUODENOSCOPY (EGD) WITH PROPOFOL N/A 05/29/2019   Procedure: ESOPHAGOGASTRODUODENOSCOPY (EGD) WITH PROPOFOL;  Surgeon: Rush Landmark Telford Nab., MD;  Location: [facility];  Service: Gastroenterology;  Laterality: N/A;  . OOPHORECTOMY     Only 1 was taken  .  POLYPECTOMY  05/29/2019   Procedure: POLYPECTOMY;  Surgeon: Mansouraty, Telford Nab., MD;  Location: [facility];  Service: Gastroenterology;;  . REMOVAL OF STONES  05/29/2019   Procedure: REMOVAL OF STONES;  Surgeon: Irving Copas., MD;  Location: [facility];  Service: Gastroenterology;;  . TONSILLECTOMY    . TUBAL LIGATION      There were no vitals filed for this visit.    Subjective Assessment - 03/03/20 0912    Subjective Pt reports chronic pancreatitis, for which she is frequently hospitalized for. Has had mild R knee pain in the past, but has been worsened lately, after last hospitalization. Lives alone/widow. Has stairs, increased pain with this. Is not exercising at this time. Pain variable with activity. Had one instance of knee buckling on her.    Limitations Lifting;Walking;Standing;House hold activities    Patient Stated Goals Decreased pain in R knee    Currently in Pain? Yes    Pain Score 5     Pain Location Knee    Pain Orientation Right    Pain Descriptors / Indicators Aching    Pain Type Acute pain    Pain Onset 1 to 4 weeks ago    Pain Frequency Intermittent    Aggravating Factors  standing, walking, transfers ,stairs,    Pain Relieving Factors rest              [facility] PT Assessment - 03/03/20 0001  Assessment   Medical Diagnosis R knee pain    Referring Provider (PT) Lynne Leader    Prior Therapy no      Balance Screen   Has the patient fallen in the past 6 months No      Prior Function   Level of Independence Independent      Cognition   Overall Cognitive Status Within Functional Limits for tasks assessed      AROM   Overall AROM Comments R knee: mild limitation for full extension, Flexion WNL.   L knee: WNL:     Hips: [facility]       Strength   Strength Assessment Site Knee;Hip    Right/Left Hip Right    Right Hip Flexion 4-/5    Right Hip Extension 4-/5    Right Hip External Rotation  4-/5    Right Hip Internal Rotation 4-/5    Right  Hip ABduction 4-/5    Right/Left Knee Right    Right Knee Flexion 4/5    Right Knee Extension 4/5      Palpation   Palpation comment pain in medial knee and lateral knee, around patella, no pain at anterior knee, patella tendon,  Mild maltracking of R patella                       Objective measurements completed on examination: See above findings.       [facility] Adult PT Treatment/Exercise - 03/03/20 0001      Exercises   Exercises Knee/Hip      Knee/Hip Exercises: Stretches   Active Hamstring Stretch 2 reps;30 seconds    Active Hamstring Stretch Limitations seated      Knee/Hip Exercises: Seated   Long Arc Quad 15 reps;Right      Knee/Hip Exercises: Supine   Quad Sets 10 reps;Both    Straight Leg Raises 10 reps;Both                  PT Education - 03/03/20 0920    Education Details PT POC, Exam findings, Initial HEP    Person(s) Educated Patient    Methods Explanation;Demonstration;Tactile cues;Verbal cues;Handout    Comprehension Verbalized understanding;Returned demonstration;Verbal cues required;Tactile cues required;Need further instruction            PT Short Term Goals - 03/03/20 0926      PT SHORT TERM GOAL #1   Title Pt to be independent with initial HEP    Time 2    Period Weeks    Status New    Target Date 03/15/20             PT Long Term Goals - 03/03/20 0926      PT LONG TERM GOAL #1   Title Pt to be independent with final HEP    Time 6    Period Weeks    Status New    Target Date 04/12/20      PT LONG TERM GOAL #2   Title Pt to report decreased pain in R knee to 0-1/10 with standing and walking activity    Time 6    Period Weeks    Status New    Target Date 04/12/20      PT LONG TERM GOAL #3   Title Pt to demo improved strength of R knee and hip to at least 4+/5 to improve stability and pain    Time 6    Period Weeks    Status New  Target Date 04/12/20      PT LONG TERM GOAL #4   Title Pt to demo  ability for safe stair negotiation with 1 hand rail and pain no greater than 1/10.    Time 6    Period Weeks    Status New    Target Date 04/12/20      PT LONG TERM GOAL #5   Title Pt to report ability for ambulation up to 30 min, with pain in knee no greater than 1/10 , for improved ability for exercise.    Time 6    Period Weeks    Status New    Target Date 04/12/20                  Plan - 03/03/20 9518    Clinical Impression Statement Pt presents with primary complaint of increased pain in R knee. Pt with most pain at lateral and medial knee around patella. Pt with decreased strength of R knee and hip ,with lack of effective HEP for her dx. She is generally deconditioned due to re-occuring hospitalizations. Pt with decreased ability for full functional acitivites, stairs, bending, squatting, transfers, and exercise, due to pain and deficits. Pt to benefit from skilled PT to improve.    Personal Factors and Comorbidities Fitness;Comorbidity 1;Time since onset of injury/illness/exacerbation    Examination-Activity Limitations Lift;Stand;Locomotion Level;Bend;Stairs;Squat    Examination-Participation Restrictions Church;Cleaning;Community Activity;Driving;Shop;Laundry    Stability/Clinical Decision Making Stable/Uncomplicated    Clinical Decision Making Low    Rehab Potential Good    PT Frequency 2x / week    PT Duration 6 weeks    PT Treatment/Interventions ADLs/Self Care Home Management;Cryotherapy;Iontophoresis 4mg /ml Dexamethasone;Moist Heat;Ultrasound;DME Instruction;Stair training;Gait training;Functional mobility training;Therapeutic activities;Therapeutic exercise;Orthotic Fit/Training;Patient/family education;Neuromuscular re-education;Balance training;Passive range of motion;Dry needling;Vasopneumatic Device;Taping;Spinal Manipulations;Joint Manipulations;Manual techniques    PT Home Exercise Plan [email or web address]    Consulted and Agree with Plan of Care Patient            Patient will benefit from skilled therapeutic intervention in order to improve the following deficits and impairments:  Pain, Decreased mobility, Increased muscle spasms, Decreased activity tolerance, Decreased range of motion, Decreased strength, Impaired flexibility, Difficulty walking  Visit Diagnosis: Chronic pain of right knee     Problem List Patient Active Problem List   Diagnosis Date Noted  . Acute pancreatitis 01/21/2020  . Gastroesophageal reflux disease   . Basal cell carcinoma (BCC) in situ of skin 10/11/2018  . Stress incontinence 10/11/2018  . Hypotension (arterial) 08/09/2018  . Sinus bradycardia on ECG 08/09/2018  . AKI (acute kidney injury) ([place]) 08/09/2018  . Recurrent acute pancreatitis 08/08/2018  . Nausea & vomiting 03/07/2018  . Sepsis ([place]) 01/01/2018  . Alternating constipation and diarrhea 09/26/2016  . Obesity (BMI 30-39.9) 07/20/2014  . Chronic insomnia 06/16/2014  . Hypokalemia 06/10/2014  . Volume overload 06/10/2014  . UTI (urinary tract infection) 06/10/2014  . Leukocytosis 06/10/2014  . Acute respiratory failure with hypoxia ([place]) 06/10/2014  . Acute recurrent pancreatitis 06/06/2014  . Pancreatitis 06/06/2014  . Hypertension 09/11/2012  . Adjustment disorder with mixed anxiety and depressed mood 09/11/2012  . Pancreas divisum 01/18/2011  . URI 09/02/2010  . Depression, recurrent ([place]) 04/01/2010  . PANCREATITIS, HX OF 04/01/2010    Lyndee Hensen, PT, DPT 9:50 AM  03/03/20    [facility] [place], [place], [address] Phone: [phone]   Fax:  [phone]  Name: Linda Blackburn MRN: [id number] Date of Birth: November 19, 1950

## 2020-03-04 ENCOUNTER — Encounter: Payer: Medicare Other | Admitting: Physical Therapy

## 2020-03-04 NOTE — Therapy (Signed)
[facility] [address] [place], [place], [address] Phone: [phone]   Fax:  [phone]  Physical Therapy Treatment  Patient Details  Name: Linda Blackburn MRN: [id number] Date of Birth: [birth year] Referring Provider (PT): Lynne Leader   Encounter Date: 03/03/2020   PT End of Session - 03/03/20 6644    Visit Number 2    Number of Visits 12    Date for PT Re-Evaluation 04/12/20    Authorization Type Medicare    PT Start Time 0347    PT Stop Time 1647    PT Time Calculation (min) 40 min    Activity Tolerance Patient tolerated treatment well    Behavior During Therapy [facility] for tasks assessed/performed           Past Medical History:  Diagnosis Date  . Abnormal uterine bleeding   . Amenorrhea   . Anxiety   . Depression   . Gallstones   . Hay fever   . HTN (hypertension)   . Pancreatitis   . Urine incontinence   . UTI (urinary tract infection)    reoccuring     Past Surgical History:  Procedure Laterality Date  . ABDOMINAL HYSTERECTOMY  1984   fibroids  . APPENDECTOMY  1984  . BILIARY DILATION  05/29/2019   Procedure: BILIARY DILATION;  Surgeon: Rush Landmark Telford Nab., MD;  Location: [facility];  Service: Gastroenterology;;  . BIOPSY  05/29/2019   Procedure: BIOPSY;  Surgeon: Irving Copas., MD;  Location: [facility];  Service: Gastroenterology;;  . [name]  09/08/1999  . ERCP N/A 05/29/2019   Procedure: ENDOSCOPIC RETROGRADE CHOLANGIOPANCREATOGRAPHY (ERCP);  Surgeon: Irving Copas., MD;  Location: [facility];  Service: Gastroenterology;  Laterality: N/A;  . ERCP W/ SPHINCTEROTOMY AND BALLOON DILATION  08/2008  . ESOPHAGOGASTRODUODENOSCOPY (EGD) WITH PROPOFOL N/A 05/29/2019   Procedure: ESOPHAGOGASTRODUODENOSCOPY (EGD) WITH PROPOFOL;  Surgeon: Rush Landmark Telford Nab., MD;  [facility]: [facility];  Service: Gastroenterology;  Laterality: N/A;  . OOPHORECTOMY     Only 1 was taken  .  POLYPECTOMY  05/29/2019   Procedure: POLYPECTOMY;  Surgeon: Mansouraty, Telford Nab., MD;  Location: [facility];  Service: Gastroenterology;;  . REMOVAL OF STONES  05/29/2019   Procedure: REMOVAL OF STONES;  Surgeon: Irving Copas., MD;  [facility]: [facility];  Service: Gastroenterology;;  . TONSILLECTOMY    . TUBAL LIGATION      There were no vitals filed for this visit.   Subjective Assessment - 03/03/20 1612    Subjective Pt with no new complaints. Has mild soreness with HEP.    Currently in Pain? Yes    Pain Score 5     Pain Location Knee    Pain Orientation Right    Pain Descriptors / Indicators Aching    Pain Type Acute pain    Pain Onset 1 to 4 weeks ago    Pain Frequency Intermittent                             [facility] Adult PT Treatment/Exercise - 03/03/20 1614      Exercises   Exercises Knee/Hip      Knee/Hip Exercises: Stretches   Active Hamstring Stretch 2 reps;30 seconds    Active Hamstring Stretch Limitations seated      Knee/Hip Exercises: Aerobic   Recumbent Bike L1 x 5 min;       Knee/Hip Exercises: Seated   Long Arc [employer] reps;Both  Long Arc Quad Weight 2 lbs.      Knee/Hip Exercises: Supine   Quad Sets 10 reps;Both    Short Arc [employer] 20 reps    Short Arc Quad Sets Limitations 2    Straight Leg Raises 10 reps;Both      Manual Therapy   Manual Therapy Joint mobilization;Soft tissue mobilization;Taping;Passive ROM    Joint Mobilization R knee mobs to inc flex and ext    Soft tissue mobilization STM and IASTm to R quad     Passive ROM for increasing extension    Kinesiotex Ligament Correction      Kinesiotix   Ligament Correction 1 I strip for patella tracking, 1 I strip for patellla tendeon decompression;                     PT Short Term Goals - 03/03/20 0926      PT SHORT TERM GOAL #1   Title Pt to be independent with initial HEP    Time 2    Period Weeks    Status New    Target Date  03/15/20             PT Long Term Goals - 03/03/20 0926      PT LONG TERM GOAL #1   Title Pt to be independent with final HEP    Time 6    Period Weeks    Status New    Target Date 04/12/20      PT LONG TERM GOAL #2   Title Pt to report decreased pain in R knee to 0-1/10 with standing and walking activity    Time 6    Period Weeks    Status New    Target Date 04/12/20      PT LONG TERM GOAL #3   Title Pt to demo improved strength of R knee and hip to at least 4+/5 to improve stability and pain    Time 6    Period Weeks    Status New    Target Date 04/12/20      PT LONG TERM GOAL #4   Title Pt to demo ability for safe stair negotiation with 1 hand rail and pain no greater than 1/10.    Time 6    Period Weeks    Status New    Target Date 04/12/20      PT LONG TERM GOAL #5   Title Pt to report ability for ambulation up to 30 min, with pain in knee no greater than 1/10 , for improved ability for exercise.    Time 6    Period Weeks    Status New    Target Date 04/12/20                 Plan - 03/04/20 1007    Clinical Impression Statement Ther ex progressed today for light strengthening, HEP reviewed. Manual done for quad tightness/pain, as well as to improve ROM fo extension. Trial for taping for pain and patella tracking. Plan to progress as tolerateed. Pt with soreness with most activities today.    Personal Factors and Comorbidities Fitness;Comorbidity 1;Time since onset of injury/illness/exacerbation    Examination-Activity Limitations Lift;Stand;Locomotion Level;Bend;Stairs;Squat    Examination-Participation Restrictions Church;Cleaning;Community Activity;Driving;Shop;Laundry    Stability/Clinical Decision Making Stable/Uncomplicated    Rehab Potential Good    PT Frequency 2x / week    PT Duration 6 weeks    PT Treatment/Interventions ADLs/Self Care Home Management;Cryotherapy;Iontophoresis 4mg /ml Dexamethasone;Moist  Heat;Ultrasound;DME Instruction;Stair  training;Gait training;Functional mobility training;Therapeutic activities;Therapeutic exercise;Orthotic Fit/Training;Patient/family education;Neuromuscular re-education;Balance training;Passive range of motion;Dry needling;Vasopneumatic Device;Taping;Spinal Manipulations;Joint Manipulations;Manual techniques    PT Home Exercise Plan [id number]    Consulted and Agree with Plan of Care Patient           Patient will benefit from skilled therapeutic intervention in order to improve the following deficits and impairments:  Pain, Decreased mobility, Increased muscle spasms, Decreased activity tolerance, Decreased range of motion, Decreased strength, Impaired flexibility, Difficulty walking  Visit Diagnosis: Chronic pain of right knee     Problem List Patient Active Problem List   Diagnosis Date Noted  . Acute pancreatitis 01/21/2020  . Gastroesophageal reflux disease   . Basal cell carcinoma (BCC) in situ of skin 10/11/2018  . Stress incontinence 10/11/2018  . Hypotension (arterial) 08/09/2018  . Sinus bradycardia on ECG 08/09/2018  . AKI (acute kidney injury) ([place]) 08/09/2018  . Recurrent acute pancreatitis 08/08/2018  . Nausea & vomiting 03/07/2018  . Sepsis ([place]) 01/01/2018  . Alternating constipation and diarrhea 09/26/2016  . Obesity (BMI 30-39.9) 07/20/2014  . Chronic insomnia 06/16/2014  . Hypokalemia 06/10/2014  . Volume overload 06/10/2014  . UTI (urinary tract infection) 06/10/2014  . Leukocytosis 06/10/2014  . Acute respiratory failure with hypoxia ([place]) 06/10/2014  . Acute recurrent pancreatitis 06/06/2014  . Pancreatitis 06/06/2014  . Hypertension 09/11/2012  . Adjustment disorder with mixed anxiety and depressed mood 09/11/2012  . Pancreas divisum 01/18/2011  . URI 09/02/2010  . Depression, recurrent ([place]) 04/01/2010  . PANCREATITIS, HX OF 04/01/2010    Lyndee Hensen, PT, DPT 10:09 AM  03/04/20    [facility] [place], [place], [address] Phone: [phone]   Fax:  [phone]  Name: Linda Blackburn MRN: [id number] Date of Birth: April 19, 1951

## 2020-03-09 ENCOUNTER — Encounter: Payer: Self-pay | Admitting: Physical Therapy

## 2020-03-09 ENCOUNTER — Other Ambulatory Visit: Payer: Self-pay

## 2020-03-09 ENCOUNTER — Ambulatory Visit (INDEPENDENT_AMBULATORY_CARE_PROVIDER_SITE_OTHER): Payer: Medicare Other | Admitting: Physical Therapy

## 2020-03-09 DIAGNOSIS — G8929 Other chronic pain: Secondary | ICD-10-CM

## 2020-03-09 DIAGNOSIS — M25561 Pain in right knee: Secondary | ICD-10-CM | POA: Diagnosis not present

## 2020-03-11 ENCOUNTER — Encounter: Payer: Self-pay | Admitting: Physical Therapy

## 2020-03-11 ENCOUNTER — Ambulatory Visit (INDEPENDENT_AMBULATORY_CARE_PROVIDER_SITE_OTHER): Payer: Medicare Other | Admitting: Physical Therapy

## 2020-03-11 ENCOUNTER — Other Ambulatory Visit: Payer: Self-pay

## 2020-03-11 DIAGNOSIS — G8929 Other chronic pain: Secondary | ICD-10-CM | POA: Diagnosis not present

## 2020-03-11 DIAGNOSIS — M25561 Pain in right knee: Secondary | ICD-10-CM

## 2020-03-11 NOTE — Therapy (Signed)
[facility] [address] [place], [place], [address] Phone: [phone]   Fax:  [phone]  Physical Therapy Treatment  Patient Details  Name: Linda Blackburn MRN: [id number] Date of Birth: 1950-12-05 Referring Provider (PT): Lynne Leader   Encounter Date: 03/09/2020   PT End of Session - 03/11/20 1200    Visit Number 3    Number of Visits 12    Date for PT Re-Evaluation 04/12/20    Authorization Type Medicare    PT Start Time 9390    PT Stop Time 1340    PT Time Calculation (min) 44 min    Activity Tolerance Patient tolerated treatment well    Behavior During Therapy [facility] for tasks assessed/performed           Past Medical History:  Diagnosis Date  . Abnormal uterine bleeding   . Amenorrhea   . Anxiety   . Depression   . Gallstones   . Hay fever   . HTN (hypertension)   . Pancreatitis   . Urine incontinence   . UTI (urinary tract infection)    reoccuring     Past Surgical History:  Procedure Laterality Date  . ABDOMINAL HYSTERECTOMY  1984   fibroids  . APPENDECTOMY  1984  . BILIARY DILATION  05/29/2019   Procedure: BILIARY DILATION;  Surgeon: Rush Landmark Telford Nab., MD;  Location: [facility];  Service: Gastroenterology;;  . BIOPSY  05/29/2019   Procedure: BIOPSY;  Surgeon: Irving Copas., MD;  [facility];  Service: Gastroenterology;;  . [name]  09/08/1999  . ERCP N/A 05/29/2019   Procedure: ENDOSCOPIC RETROGRADE CHOLANGIOPANCREATOGRAPHY (ERCP);  Surgeon: Irving Copas., MD;  Location: [facility];  Service: Gastroenterology;  Laterality: N/A;  . ERCP W/ SPHINCTEROTOMY AND BALLOON DILATION  08/2008  . ESOPHAGOGASTRODUODENOSCOPY (EGD) WITH PROPOFOL N/A 05/29/2019   Procedure: ESOPHAGOGASTRODUODENOSCOPY (EGD) WITH PROPOFOL;  Surgeon: Rush Landmark Telford Nab., MD;  Location: [facility];  Service: Gastroenterology;  Laterality: N/A;  . OOPHORECTOMY     Only 1 was taken  .  POLYPECTOMY  05/29/2019   Procedure: POLYPECTOMY;  Surgeon: Mansouraty, Telford Nab., MD;  Location: [facility];  Service: Gastroenterology;;  . REMOVAL OF STONES  05/29/2019   Procedure: REMOVAL OF STONES;  Surgeon: Irving Copas., MD;  [facility]: [facility];  Service: Gastroenterology;;  . TONSILLECTOMY    . TUBAL LIGATION      There were no vitals filed for this visit.   Subjective Assessment - 03/11/20 1157    Subjective Pt stats less pain in R knee. Tape did not irritate skin, but did not stay on well.    Currently in Pain? No/denies    Pain Score 0-No pain    Pain Location Knee    Pain Orientation Right    Pain Descriptors / Indicators Aching    Pain Type Acute pain    Pain Onset 1 to 4 weeks ago    Pain Frequency Intermittent                             [facility] Adult PT Treatment/Exercise - 03/11/20 0001      Exercises   Exercises Knee/Hip      Knee/Hip Exercises: Stretches   Active Hamstring Stretch 2 reps;30 seconds    Active Hamstring Stretch Limitations seated      Knee/Hip Exercises: Aerobic   Recumbent Bike L1 x 8 min;       Knee/Hip Exercises: Standing  Hip Flexion 20 reps    Hip Abduction 2 sets;10 reps;Both      Knee/Hip Exercises: Seated   Long Arc [employer] reps;Both    Long Arc Quad Weight 2 lbs.    Hamstring Curl Both;1 set;15 reps      Knee/Hip Exercises: Supine   Quad Sets 10 reps;Both    Straight Leg Raises 10 reps;Both      Knee/Hip Exercises: Sidelying   Hip ABduction 10 reps;Both      Manual Therapy   Manual Therapy Joint mobilization;Soft tissue mobilization;Taping;Passive ROM    Joint Mobilization R knee mobs to inc flex and ext    Soft tissue mobilization STM and IASTm to R quad     Passive ROM for increasing extension                    PT Short Term Goals - 03/03/20 0926      PT SHORT TERM GOAL #1   Title Pt to be independent with initial HEP    Time 2    Period Weeks    Status New     Target Date 03/15/20             PT Long Term Goals - 03/03/20 0926      PT LONG TERM GOAL #1   Title Pt to be independent with final HEP    Time 6    Period Weeks    Status New    Target Date 04/12/20      PT LONG TERM GOAL #2   Title Pt to report decreased pain in R knee to 0-1/10 with standing and walking activity    Time 6    Period Weeks    Status New    Target Date 04/12/20      PT LONG TERM GOAL #3   Title Pt to demo improved strength of R knee and hip to at least 4+/5 to improve stability and pain    Time 6    Period Weeks    Status New    Target Date 04/12/20      PT LONG TERM GOAL #4   Title Pt to demo ability for safe stair negotiation with 1 hand rail and pain no greater than 1/10.    Time 6    Period Weeks    Status New    Target Date 04/12/20      PT LONG TERM GOAL #5   Title Pt to report ability for ambulation up to 30 min, with pain in knee no greater than 1/10 , for improved ability for exercise.    Time 6    Period Weeks    Status New    Target Date 04/12/20                 Plan - 03/11/20 1201    Clinical Impression Statement Ther ex progressed with no increased pain in knee today. Plan to progress strength and activiteis as tolerated. Pt pleased with decreased pain.    Personal Factors and Comorbidities Fitness;Comorbidity 1;Time since onset of injury/illness/exacerbation    Examination-Activity Limitations Lift;Stand;Locomotion Level;Bend;Stairs;Squat    Examination-Participation Restrictions Church;Cleaning;Community Activity;Driving;Shop;Laundry    Stability/Clinical Decision Making Stable/Uncomplicated    Rehab Potential Good    PT Frequency 2x / week    PT Duration 6 weeks    PT Treatment/Interventions ADLs/Self Care Home Management;Cryotherapy;Iontophoresis 4mg /ml Dexamethasone;Moist Heat;Ultrasound;DME Instruction;Stair training;Gait training;Functional mobility training;Therapeutic activities;Therapeutic exercise;Orthotic  Fit/Training;Patient/family education;Neuromuscular re-education;Balance training;Passive range of  motion;Dry needling;Vasopneumatic Device;Taping;Spinal Manipulations;Joint Manipulations;Manual techniques    PT Home Exercise Plan [id number]    Consulted and Agree with Plan of Care Patient           Patient will benefit from skilled therapeutic intervention in order to improve the following deficits and impairments:  Pain, Decreased mobility, Increased muscle spasms, Decreased activity tolerance, Decreased range of motion, Decreased strength, Impaired flexibility, Difficulty walking  Visit Diagnosis: Chronic pain of right knee     Problem List Patient Active Problem List   Diagnosis Date Noted  . Acute pancreatitis 01/21/2020  . Gastroesophageal reflux disease   . Basal cell carcinoma (BCC) in situ of skin 10/11/2018  . Stress incontinence 10/11/2018  . Hypotension (arterial) 08/09/2018  . Sinus bradycardia on ECG 08/09/2018  . AKI (acute kidney injury) ([place]) 08/09/2018  . Recurrent acute pancreatitis 08/08/2018  . Nausea & vomiting 03/07/2018  . Sepsis ([place]) 01/01/2018  . Alternating constipation and diarrhea 09/26/2016  . Obesity (BMI 30-39.9) 07/20/2014  . Chronic insomnia 06/16/2014  . Hypokalemia 06/10/2014  . Volume overload 06/10/2014  . UTI (urinary tract infection) 06/10/2014  . Leukocytosis 06/10/2014  . Acute respiratory failure with hypoxia ([place]) 06/10/2014  . Acute recurrent pancreatitis 06/06/2014  . Pancreatitis 06/06/2014  . Hypertension 09/11/2012  . Adjustment disorder with mixed anxiety and depressed mood 09/11/2012  . Pancreas divisum 01/18/2011  . URI 09/02/2010  . Depression, recurrent ([place]) 04/01/2010  . PANCREATITIS, HX OF 04/01/2010   Lyndee Hensen, PT, DPT 12:04 PM  03/11/20    [facility] [place], [place], [address] Phone: [phone]   Fax:  [phone]  Name: Linda Blackburn MRN: [id number] Date of Birth: 1951/03/31

## 2020-03-12 NOTE — Therapy (Signed)
[facility] [address] [place], [place], [address] Phone: [phone]   Fax:  [phone]  Physical Therapy Treatment  Patient Details  Name: Linda Blackburn MRN: [id number] Date of Birth: [birth year] Referring Provider (PT): Lynne Leader   Encounter Date: 03/11/2020   PT End of Session - 03/11/20 1312    Visit Number 4    Number of Visits 12    Date for PT Re-Evaluation 04/12/20    Authorization Type Medicare    PT Start Time 1304    PT Stop Time 1345    PT Time Calculation (min) 41 min    Activity Tolerance Patient tolerated treatment well    Behavior During Therapy [facility] for tasks assessed/performed           Past Medical History:  Diagnosis Date   Abnormal uterine bleeding    Amenorrhea    Anxiety    Depression    Gallstones    Hay fever    HTN (hypertension)    Pancreatitis    Urine incontinence    UTI (urinary tract infection)    reoccuring     Past Surgical History:  Procedure Laterality Date   ABDOMINAL HYSTERECTOMY  1984   fibroids   APPENDECTOMY  1984   BILIARY DILATION  05/29/2019   Procedure: BILIARY DILATION;  Surgeon: Irving Copas., MD;  Location: [facility];  Service: Gastroenterology;;   BIOPSY  05/29/2019   Procedure: BIOPSY;  Surgeon: Irving Copas., MD;  Location: [facility];  Service: Gastroenterology;;   CHOLECYSTECTOMY  09/08/1999   ERCP N/A 05/29/2019   Procedure: ENDOSCOPIC RETROGRADE CHOLANGIOPANCREATOGRAPHY (ERCP);  Surgeon: Irving Copas., MD;  Location: [facility];  Service: Gastroenterology;  Laterality: N/A;   ERCP W/ SPHINCTEROTOMY AND BALLOON DILATION  08/2008   ESOPHAGOGASTRODUODENOSCOPY (EGD) WITH PROPOFOL N/A 05/29/2019   Procedure: ESOPHAGOGASTRODUODENOSCOPY (EGD) WITH PROPOFOL;  Surgeon: Rush Landmark Telford Nab., MD;  [facility]: [facility];  Service: Gastroenterology;  Laterality: N/A;   OOPHORECTOMY     Only 1 was taken    POLYPECTOMY  05/29/2019   Procedure: POLYPECTOMY;  Surgeon: Mansouraty, Telford Nab., MD;  Location: [facility];  Service: Gastroenterology;;   REMOVAL OF STONES  05/29/2019   Procedure: REMOVAL OF STONES;  Surgeon: Irving Copas., MD;  Location: [facility];  Service: Gastroenterology;;   TONSILLECTOMY     TUBAL LIGATION      There were no vitals filed for this visit.   Subjective Assessment - 03/11/20 1310    Subjective Pt states minimal pain in knee. Low back has been sore since last visit. Pt has had on/off pain in low back but not in quite a while. No injury, but is doing new exercises for knee pain.    Currently in Pain? Yes    Pain Score 0-No pain    Pain Location Back    Pain Orientation Right;Left    Pain Descriptors / Indicators Aching    Pain Type Acute pain    Pain Onset In the past 7 days    Pain Frequency Intermittent    Aggravating Factors  bending                             [facility] Adult PT Treatment/Exercise - 03/12/20 0001      Exercises   Exercises Knee/Hip      Knee/Hip Exercises: Stretches   Active Hamstring Stretch 2 reps;30 seconds    Active Hamstring Stretch  Limitations seated    Other Knee/Hip Stretches SKTC 30 sec x 3, pelvic tilts x20;       Knee/Hip Exercises: Aerobic   Recumbent Bike L1 x 8 min;       Knee/Hip Exercises: Standing   Hip Flexion 20 reps      Knee/Hip Exercises: Seated   Long Arc [employer] reps;Both    Long Arc Quad Weight 2 lbs.    Hamstring Curl Both;1 set;20 reps      Manual Therapy   Manual Therapy Joint mobilization;Soft tissue mobilization;Taping;Passive ROM    Joint Mobilization R knee mobs to inc flex and ext, light lumbar PA and SI mobs.                     PT Short Term Goals - 03/03/20 0926      PT SHORT TERM GOAL #1   Title Pt to be independent with initial HEP    Time 2    Period Weeks    Status New    Target Date 03/15/20             PT Long Term Goals -  03/03/20 0926      PT LONG TERM GOAL #1   Title Pt to be independent with final HEP    Time 6    Period Weeks    Status New    Target Date 04/12/20      PT LONG TERM GOAL #2   Title Pt to report decreased pain in R knee to 0-1/10 with standing and walking activity    Time 6    Period Weeks    Status New    Target Date 04/12/20      PT LONG TERM GOAL #3   Title Pt to demo improved strength of R knee and hip to at least 4+/5 to improve stability and pain    Time 6    Period Weeks    Status New    Target Date 04/12/20      PT LONG TERM GOAL #4   Title Pt to demo ability for safe stair negotiation with 1 hand rail and pain no greater than 1/10.    Time 6    Period Weeks    Status New    Target Date 04/12/20      PT LONG TERM GOAL #5   Title Pt to report ability for ambulation up to 30 min, with pain in knee no greater than 1/10 , for improved ability for exercise.    Time 6    Period Weeks    Status New    Target Date 04/12/20                 Plan - 03/12/20 1354    Clinical Impression Statement Pt progressing well with knee pain. She has increased soreness in low back/SI today, possibly from increased ther ex w knee pain. She has soreness to palpate at central sacrum today, no pain in central lumbar spine or in lumbar muscuature. She has pain in back with bending, no pain with other activiteis today Added light lumbar stretches for HEP, discussed d/c of SLR exercise if aggravating back pain. Plan to progress as tolerated.    Personal Factors and Comorbidities Fitness;Comorbidity 1;Time since onset of injury/illness/exacerbation    Examination-Activity Limitations Lift;Stand;Locomotion Level;Bend;Stairs;Squat    Examination-Participation Restrictions Church;Cleaning;Community Activity;Driving;Shop;Laundry    Stability/Clinical Decision Making Stable/Uncomplicated    Rehab Potential Good    PT  Frequency 2x / week    PT Duration 6 weeks    PT Treatment/Interventions  ADLs/Self Care Home Management;Cryotherapy;Iontophoresis 4mg /ml Dexamethasone;Moist Heat;Ultrasound;DME Instruction;Stair training;Gait training;Functional mobility training;Therapeutic activities;Therapeutic exercise;Orthotic Fit/Training;Patient/family education;Neuromuscular re-education;Balance training;Passive range of motion;Dry needling;Vasopneumatic Device;Taping;Spinal Manipulations;Joint Manipulations;Manual techniques    PT Home Exercise Plan [id number]    Consulted and Agree with Plan of Care Patient           Patient will benefit from skilled therapeutic intervention in order to improve the following deficits and impairments:  Pain, Decreased mobility, Increased muscle spasms, Decreased activity tolerance, Decreased range of motion, Decreased strength, Impaired flexibility, Difficulty walking  Visit Diagnosis: Chronic pain of right knee     Problem List Patient Active Problem List   Diagnosis Date Noted   Acute pancreatitis 01/21/2020   Gastroesophageal reflux disease    Basal cell carcinoma (BCC) in situ of skin 10/11/2018   Stress incontinence 10/11/2018   Hypotension (arterial) 08/09/2018   Sinus bradycardia on ECG 08/09/2018   AKI (acute kidney injury) ([place]) 08/09/2018   Recurrent acute pancreatitis 08/08/2018   Nausea & vomiting 03/07/2018   Sepsis ([place]) 01/01/2018   Alternating constipation and diarrhea 09/26/2016   Obesity (BMI 30-39.9) 07/20/2014   Chronic insomnia 06/16/2014   Hypokalemia 06/10/2014   Volume overload 06/10/2014   UTI (urinary tract infection) 06/10/2014   Leukocytosis 06/10/2014   Acute respiratory failure with hypoxia ([place]) 06/10/2014   Acute recurrent pancreatitis 06/06/2014   Pancreatitis 06/06/2014   Hypertension 09/11/2012   Adjustment disorder with mixed anxiety and depressed mood 09/11/2012   Pancreas divisum 01/18/2011   URI 09/02/2010   Depression, recurrent ([place]) 04/01/2010   PANCREATITIS, HX OF  04/01/2010    Lyndee Hensen, PT, DPT 1:58 PM  03/12/20    [facility] [address] [place], [place], [address] Phone: [phone]   Fax:  [phone]  Name: Linda Blackburn MRN: [id number] Date of Birth: September 27, 1950

## 2020-03-15 ENCOUNTER — Other Ambulatory Visit: Payer: Self-pay

## 2020-03-15 ENCOUNTER — Other Ambulatory Visit: Payer: Self-pay | Admitting: *Deleted

## 2020-03-15 ENCOUNTER — Ambulatory Visit (INDEPENDENT_AMBULATORY_CARE_PROVIDER_SITE_OTHER): Payer: Medicare Other | Admitting: Physical Therapy

## 2020-03-15 ENCOUNTER — Encounter: Payer: Self-pay | Admitting: Physical Therapy

## 2020-03-15 DIAGNOSIS — G8929 Other chronic pain: Secondary | ICD-10-CM

## 2020-03-15 DIAGNOSIS — M25561 Pain in right knee: Secondary | ICD-10-CM | POA: Diagnosis not present

## 2020-03-15 NOTE — Therapy (Signed)
[facility] [address] [place], [place], [address] Phone: [phone]   Fax:  [phone]  Physical Therapy Treatment  Patient Details  Name: Linda Blackburn MRN: [id number] Date of Birth: [birth year] Referring Provider (PT): Lynne Leader   Encounter Date: 03/15/2020   PT End of Session - 03/15/20 2113    Visit Number 5    Number of Visits 12    Date for PT Re-Evaluation 04/12/20    Authorization Type Medicare    PT Start Time 1430    PT Stop Time 1513    PT Time Calculation (min) 43 min    Activity Tolerance Patient tolerated treatment well    Behavior During Therapy [facility] for tasks assessed/performed           Past Medical History:  Diagnosis Date  . Abnormal uterine bleeding   . Amenorrhea   . Anxiety   . Depression   . Gallstones   . Hay fever   . HTN (hypertension)   . Pancreatitis   . Urine incontinence   . UTI (urinary tract infection)    reoccuring     Past Surgical History:  Procedure Laterality Date  . ABDOMINAL HYSTERECTOMY  1984   fibroids  . APPENDECTOMY  1984  . BILIARY DILATION  05/29/2019   Procedure: BILIARY DILATION;  Surgeon: Rush Landmark Telford Nab., MD;  Location: [facility];  Service: Gastroenterology;;  . BIOPSY  05/29/2019   Procedure: BIOPSY;  Surgeon: Irving Copas., MD;  Location: [facility];  Service: Gastroenterology;;  . [name]  09/08/1999  . ERCP N/A 05/29/2019   Procedure: ENDOSCOPIC RETROGRADE CHOLANGIOPANCREATOGRAPHY (ERCP);  Surgeon: Irving Copas., MD;  Location: [facility];  Service: Gastroenterology;  Laterality: N/A;  . ERCP W/ SPHINCTEROTOMY AND BALLOON DILATION  08/2008  . ESOPHAGOGASTRODUODENOSCOPY (EGD) WITH PROPOFOL N/A 05/29/2019   Procedure: ESOPHAGOGASTRODUODENOSCOPY (EGD) WITH PROPOFOL;  Surgeon: Rush Landmark Telford Nab., MD;  Location: [facility];  Service: Gastroenterology;  Laterality: N/A;  . OOPHORECTOMY     Only 1 was taken  .  POLYPECTOMY  05/29/2019   Procedure: POLYPECTOMY;  Surgeon: Mansouraty, Telford Nab., MD;  Location: [facility];  Service: Gastroenterology;;  . REMOVAL OF STONES  05/29/2019   Procedure: REMOVAL OF STONES;  Surgeon: Irving Copas., MD;  Location: [facility];  Service: Gastroenterology;;  . TONSILLECTOMY    . TUBAL LIGATION      There were no vitals filed for this visit.   Subjective Assessment - 03/15/20 2058    Subjective Pt states improving back pain, still sore at SI region, stiff in AM. Knee continues to feel better.    Currently in Pain? Yes    Pain Score 2     Pain Location Back    Pain Orientation Right;Left    Pain Descriptors / Indicators Aching    Pain Type Acute pain    Pain Onset 1 to 4 weeks ago    Pain Frequency Intermittent                             [facility] Adult PT Treatment/Exercise - 03/15/20 1443      Exercises   Exercises Knee/Hip      Knee/Hip Exercises: Stretches   Active Hamstring Stretch 2 reps;30 seconds    Active Hamstring Stretch Limitations seated    Other Knee/Hip Stretches SKTC 30 sec x 3, pelvic tilts x20; Supine March x 20;       Knee/Hip  Exercises: Aerobic   Recumbent Bike L1 x 8 min;       Knee/Hip Exercises: Standing   Hip Flexion 20 reps    Hip Abduction 2 sets;10 reps;Both      Knee/Hip Exercises: Seated   Long Arc [employer] reps;Both    Long Arc Quad Weight 2 lbs.    Hamstring Curl --      Manual Therapy   Manual Therapy Joint mobilization;Soft tissue mobilization;Taping;Passive ROM    Joint Mobilization --                    PT Short Term Goals - 03/03/20 0926      PT SHORT TERM GOAL #1   Title Pt to be independent with initial HEP    Time 2    Period Weeks    Status New    Target Date 03/15/20             PT Long Term Goals - 03/03/20 0926      PT LONG TERM GOAL #1   Title Pt to be independent with final HEP    Time 6    Period Weeks    Status New    Target Date  04/12/20      PT LONG TERM GOAL #2   Title Pt to report decreased pain in R knee to 0-1/10 with standing and walking activity    Time 6    Period Weeks    Status New    Target Date 04/12/20      PT LONG TERM GOAL #3   Title Pt to demo improved strength of R knee and hip to at least 4+/5 to improve stability and pain    Time 6    Period Weeks    Status New    Target Date 04/12/20      PT LONG TERM GOAL #4   Title Pt to demo ability for safe stair negotiation with 1 hand rail and pain no greater than 1/10.    Time 6    Period Weeks    Status New    Target Date 04/12/20      PT LONG TERM GOAL #5   Title Pt to report ability for ambulation up to 30 min, with pain in knee no greater than 1/10 , for improved ability for exercise.    Time 6    Period Weeks    Status New    Target Date 04/12/20                 Plan - 03/15/20 2114    Clinical Impression Statement Pt progressing well with knee pain. Mild soreness with activities today. Reviewed pain relief, positioning and stretches for low back pain. Plan to work towards d/c as knee pain continues to improve.    Personal Factors and Comorbidities Fitness;Comorbidity 1;Time since onset of injury/illness/exacerbation    Examination-Activity Limitations Lift;Stand;Locomotion Level;Bend;Stairs;Squat    Examination-Participation Restrictions Church;Cleaning;Community Activity;Driving;Shop;Laundry    Stability/Clinical Decision Making Stable/Uncomplicated    Rehab Potential Good    PT Frequency 2x / week    PT Duration 6 weeks    PT Treatment/Interventions ADLs/Self Care Home Management;Cryotherapy;Iontophoresis 4mg /ml Dexamethasone;Moist Heat;Ultrasound;DME Instruction;Stair training;Gait training;Functional mobility training;Therapeutic activities;Therapeutic exercise;Orthotic Fit/Training;Patient/family education;Neuromuscular re-education;Balance training;Passive range of motion;Dry needling;Vasopneumatic Device;Taping;Spinal  Manipulations;Joint Manipulations;Manual techniques    PT Home Exercise Plan [email or web address]    Consulted and Agree with Plan of Care Patient           Patient  will benefit from skilled therapeutic intervention in order to improve the following deficits and impairments:  Pain, Decreased mobility, Increased muscle spasms, Decreased activity tolerance, Decreased range of motion, Decreased strength, Impaired flexibility, Difficulty walking  Visit Diagnosis: Chronic pain of right knee     Problem List Patient Active Problem List   Diagnosis Date Noted  . Acute pancreatitis 01/21/2020  . Gastroesophageal reflux disease   . Basal cell carcinoma (BCC) in situ of skin 10/11/2018  . Stress incontinence 10/11/2018  . Hypotension (arterial) 08/09/2018  . Sinus bradycardia on ECG 08/09/2018  . AKI (acute kidney injury) ([place]) 08/09/2018  . Recurrent acute pancreatitis 08/08/2018  . Nausea & vomiting 03/07/2018  . Sepsis ([place]) 01/01/2018  . Alternating constipation and diarrhea 09/26/2016  . Obesity (BMI 30-39.9) 07/20/2014  . Chronic insomnia 06/16/2014  . Hypokalemia 06/10/2014  . Volume overload 06/10/2014  . UTI (urinary tract infection) 06/10/2014  . Leukocytosis 06/10/2014  . Acute respiratory failure with hypoxia ([place]) 06/10/2014  . Acute recurrent pancreatitis 06/06/2014  . Pancreatitis 06/06/2014  . Hypertension 09/11/2012  . Adjustment disorder with mixed anxiety and depressed mood 09/11/2012  . Pancreas divisum 01/18/2011  . URI 09/02/2010  . Depression, recurrent ([place]) 04/01/2010  . PANCREATITIS, HX OF 04/01/2010    Lyndee Hensen, PT, DPT 9:16 PM  03/15/20    [facility] [place], [place], [address] Phone: [phone]   Fax:  [phone]  Name: Linda Blackburn MRN: [id number] Date of Birth: December 18, 1950

## 2020-03-16 NOTE — Patient Outreach (Signed)
[facility]) Care Management  03/16/2020  Ivory Maduro 09/17/1950 [id number]   CSW attempted to reach pt by phone on 03/15/2020 and was unable- was able to leave voicemail message for return call.  CSW will await callback or try again per policy for 3 call attempts in 10 business days.   Eduard Clos, MSW, [place] Worker  [place] [phone]

## 2020-03-16 NOTE — Progress Notes (Addendum)
Error already printed off and handled

## 2020-03-17 ENCOUNTER — Other Ambulatory Visit: Payer: Self-pay

## 2020-03-17 ENCOUNTER — Ambulatory Visit (INDEPENDENT_AMBULATORY_CARE_PROVIDER_SITE_OTHER): Payer: Medicare Other | Admitting: Physical Therapy

## 2020-03-17 ENCOUNTER — Encounter: Payer: Self-pay | Admitting: Physical Therapy

## 2020-03-17 DIAGNOSIS — G8929 Other chronic pain: Secondary | ICD-10-CM

## 2020-03-17 DIAGNOSIS — M25561 Pain in right knee: Secondary | ICD-10-CM | POA: Diagnosis not present

## 2020-03-17 NOTE — Therapy (Signed)
[facility] [address] [place], [place], [address] Phone: [phone]   Fax:  [phone]  Physical Therapy Treatment  Patient Details  Name: Linda Blackburn MRN: [id number] Date of Birth: December 28, 1950 Referring Provider (PT): Lynne Leader   Encounter Date: 03/17/2020   PT End of Session - 03/17/20 1513    Visit Number 6    Number of Visits 12    Date for PT Re-Evaluation 04/12/20    Authorization Type Medicare    PT Start Time 1432    PT Stop Time 1512    PT Time Calculation (min) 40 min    Activity Tolerance Patient tolerated treatment well    Behavior During Therapy [facility] for tasks assessed/performed           Past Medical History:  Diagnosis Date  . Abnormal uterine bleeding   . Amenorrhea   . Anxiety   . Depression   . Gallstones   . Hay fever   . HTN (hypertension)   . Pancreatitis   . Urine incontinence   . UTI (urinary tract infection)    reoccuring     Past Surgical History:  Procedure Laterality Date  . ABDOMINAL HYSTERECTOMY  1984   fibroids  . APPENDECTOMY  1984  . BILIARY DILATION  05/29/2019   Procedure: BILIARY DILATION;  Surgeon: Rush Landmark Telford Nab., MD;  Location: [facility];  Service: Gastroenterology;;  . BIOPSY  05/29/2019   Procedure: BIOPSY;  Surgeon: Irving Copas., MD;  [facility]: [facility];  Service: Gastroenterology;;  . [name]  09/08/1999  . ERCP N/A 05/29/2019   Procedure: ENDOSCOPIC RETROGRADE CHOLANGIOPANCREATOGRAPHY (ERCP);  Surgeon: Irving Copas., MD;  Location: [facility];  Service: Gastroenterology;  Laterality: N/A;  . ERCP W/ SPHINCTEROTOMY AND BALLOON DILATION  08/2008  . ESOPHAGOGASTRODUODENOSCOPY (EGD) WITH PROPOFOL N/A 05/29/2019   Procedure: ESOPHAGOGASTRODUODENOSCOPY (EGD) WITH PROPOFOL;  Surgeon: Rush Landmark Telford Nab., MD;  Location: [facility];  Service: Gastroenterology;  Laterality: N/A;  . OOPHORECTOMY     Only 1 was taken  .  POLYPECTOMY  05/29/2019   Procedure: POLYPECTOMY;  Surgeon: Mansouraty, Telford Nab., MD;  Location: [facility];  Service: Gastroenterology;;  . REMOVAL OF STONES  05/29/2019   Procedure: REMOVAL OF STONES;  Surgeon: Irving Copas., MD;  Location: [facility];  Service: Gastroenterology;;  . TONSILLECTOMY    . TUBAL LIGATION      There were no vitals filed for this visit.   Subjective Assessment - 03/17/20 1512    Subjective Pt states improving back pain, mildly sore. R knee pain improving. Pt states she is tired today, has not slept well in last couple nights.    Currently in Pain? Yes    Pain Score 2     Pain Location Back    Pain Orientation Right;Left    Pain Descriptors / Indicators Aching    Pain Type Acute pain    Pain Onset 1 to 4 weeks ago    Pain Frequency Intermittent    Multiple Pain Sites Yes    Pain Score 0    Pain Location Knee    Pain Orientation Right                             [facility] Adult PT Treatment/Exercise - 03/17/20 1432      Exercises   Exercises Knee/Hip      Knee/Hip Exercises: Stretches   Active Hamstring Stretch 2 reps;30 seconds  Active Hamstring Stretch Limitations seated    Other Knee/Hip Stretches SKTC 30 sec x 3, pelvic tilts x20; Supine March x 20;       Knee/Hip Exercises: Aerobic   Recumbent Bike L2 x 8 min       Knee/Hip Exercises: Standing   Hip Flexion 20 reps    Hip Abduction 2 sets;10 reps;Both    Forward Step Up 10 reps;Both;Step Height: 6";Hand Hold: 1    SLS 30 sec x 3 bil;     Other Standing Knee Exercises Tandem stance 30 sec x 2 bil;       Knee/Hip Exercises: Seated   Long Arc Quad Right;20 reps;Both    Long Arc Quad Weight 2 lbs.    Hamstring Curl 20 reps    Hamstring Limitations GTB      Knee/Hip Exercises: Supine   Short Arc Quad Sets 20 reps;Right      Manual Therapy   Manual Therapy Joint mobilization;Soft tissue mobilization;Taping;Passive ROM    Passive ROM R knee flex/ext                     PT Short Term Goals - 03/03/20 0926      PT SHORT TERM GOAL #1   Title Pt to be independent with initial HEP    Time 2    Period Weeks    Status New    Target Date 03/15/20             PT Long Term Goals - 03/03/20 0926      PT LONG TERM GOAL #1   Title Pt to be independent with final HEP    Time 6    Period Weeks    Status New    Target Date 04/12/20      PT LONG TERM GOAL #2   Title Pt to report decreased pain in R knee to 0-1/10 with standing and walking activity    Time 6    Period Weeks    Status New    Target Date 04/12/20      PT LONG TERM GOAL #3   Title Pt to demo improved strength of R knee and hip to at least 4+/5 to improve stability and pain    Time 6    Period Weeks    Status New    Target Date 04/12/20      PT LONG TERM GOAL #4   Title Pt to demo ability for safe stair negotiation with 1 hand rail and pain no greater than 1/10.    Time 6    Period Weeks    Status New    Target Date 04/12/20      PT LONG TERM GOAL #5   Title Pt to report ability for ambulation up to 30 min, with pain in knee no greater than 1/10 , for improved ability for exercise.    Time 6    Period Weeks    Status New    Target Date 04/12/20                 Plan - 03/17/20 1514    Clinical Impression Statement Pt progressing well with knee pain, and back pain seems to be improving as well. Mild pain in posterior knee today with activities. Pt doing well with ther ex progression for strengthening.    Personal Factors and Comorbidities Fitness;Comorbidity 1;Time since onset of injury/illness/exacerbation    Examination-Activity Limitations Lift;Stand;Locomotion Level;Bend;Stairs;Squat    Examination-Participation Restrictions  Church;Cleaning;Community Activity;Driving;Shop;Laundry    Stability/Clinical Decision Making Stable/Uncomplicated    Rehab Potential Good    PT Frequency 2x / week    PT Duration 6 weeks    PT  Treatment/Interventions ADLs/Self Care Home Management;Cryotherapy;Iontophoresis 4mg /ml Dexamethasone;Moist Heat;Ultrasound;DME Instruction;Stair training;Gait training;Functional mobility training;Therapeutic activities;Therapeutic exercise;Orthotic Fit/Training;Patient/family education;Neuromuscular re-education;Balance training;Passive range of motion;Dry needling;Vasopneumatic Device;Taping;Spinal Manipulations;Joint Manipulations;Manual techniques    PT Home Exercise Plan [email or web address]    Consulted and Agree with Plan of Care Patient           Patient will benefit from skilled therapeutic intervention in order to improve the following deficits and impairments:  Pain, Decreased mobility, Increased muscle spasms, Decreased activity tolerance, Decreased range of motion, Decreased strength, Impaired flexibility, Difficulty walking  Visit Diagnosis: Chronic pain of right knee     Problem List Patient Active Problem List   Diagnosis Date Noted  . Acute pancreatitis 01/21/2020  . Gastroesophageal reflux disease   . Basal cell carcinoma (BCC) in situ of skin 10/11/2018  . Stress incontinence 10/11/2018  . Hypotension (arterial) 08/09/2018  . Sinus bradycardia on ECG 08/09/2018  . AKI (acute kidney injury) ([place]) 08/09/2018  . Recurrent acute pancreatitis 08/08/2018  . Nausea & vomiting 03/07/2018  . Sepsis ([place]) 01/01/2018  . Alternating constipation and diarrhea 09/26/2016  . Obesity (BMI 30-39.9) 07/20/2014  . Chronic insomnia 06/16/2014  . Hypokalemia 06/10/2014  . Volume overload 06/10/2014  . UTI (urinary tract infection) 06/10/2014  . Leukocytosis 06/10/2014  . Acute respiratory failure with hypoxia ([place]) 06/10/2014  . Acute recurrent pancreatitis 06/06/2014  . Pancreatitis 06/06/2014  . Hypertension 09/11/2012  . Adjustment disorder with mixed anxiety and depressed mood 09/11/2012  . Pancreas divisum 01/18/2011  . URI 09/02/2010  . Depression, recurrent ([place]) 04/01/2010  .  PANCREATITIS, HX OF 04/01/2010    Lyndee Hensen, PT, DPT 3:19 PM  03/17/20    [facility] [place], [place], [address] Phone: [phone]   Fax:  [phone]  Name: Candas Deemer MRN: [id number] Date of Birth: [birth year]

## 2020-03-18 ENCOUNTER — Other Ambulatory Visit: Payer: Self-pay | Admitting: *Deleted

## 2020-03-19 NOTE — Patient Outreach (Signed)
[facility]) Care Management  03/19/2020  Linda Blackburn 06-13-1951 [id number]   LATE ENTRY  CSW received a return call from pt who reports she was at her PT/rehab appointment when CSW called. Pt has received and reviewed material sent and appreciative.  Pt is feeling more comfortable and equipped with needs for when/if she needs to hire some in home support/care as well as with rides; specifically for her endoscopy down the road.  CSW validated her wishes and needs to have someone other than a neighbor or friend (no local family) to count on when she needs this type of assistance. CSW commended her and reminded her to call CSW if needs arise that she needs assistance with getting to appointment, etc.   CSW will plan to touch base with pt again in 2-3 weeks.   Eduard Clos, MSW, [place] Worker  [place] [phone]

## 2020-03-23 ENCOUNTER — Encounter: Payer: Medicare Other | Admitting: Physical Therapy

## 2020-03-24 ENCOUNTER — Ambulatory Visit (INDEPENDENT_AMBULATORY_CARE_PROVIDER_SITE_OTHER): Payer: Medicare Other | Admitting: Family Medicine

## 2020-03-24 ENCOUNTER — Ambulatory Visit: Payer: Medicare Other | Admitting: Family Medicine

## 2020-03-24 ENCOUNTER — Encounter: Payer: Self-pay | Admitting: Family Medicine

## 2020-03-24 ENCOUNTER — Other Ambulatory Visit: Payer: Self-pay

## 2020-03-24 VITALS — BP 114/78 | HR 76 | Ht 61.0 in | Wt 219.2 lb

## 2020-03-24 DIAGNOSIS — G8929 Other chronic pain: Secondary | ICD-10-CM | POA: Diagnosis not present

## 2020-03-24 DIAGNOSIS — N3946 Mixed incontinence: Secondary | ICD-10-CM | POA: Diagnosis not present

## 2020-03-24 DIAGNOSIS — M545 Low back pain: Secondary | ICD-10-CM

## 2020-03-24 DIAGNOSIS — M25561 Pain in right knee: Secondary | ICD-10-CM | POA: Diagnosis not present

## 2020-03-24 NOTE — Patient Instructions (Addendum)
Thank you for coming in today. Continue the home exercises and limited PT.  Recheck with me as needed for this.  Could re-do PT in the future it it comes back.  Let me know if you want PT for your back.

## 2020-03-24 NOTE — Progress Notes (Signed)
   I, Wendy Poet, LAT, ATC, am serving as scribe for Linda Blackburn.  Linda Blackburn is a 69 y.o. female who presents to [place] [facility] today for f/u of R ant knee pain.  She was last seen by Dr. Georgina Snell on 02/25/20 and was referred to PT of which she has completed 6 visits.  Since her last visit, pt reports that her R knee is doing much better and reports improved strength in her R leg and knee.  She rates her improvement at 75%.  She notes she is having some back soreness as well that is improving with limited physical therapy as well.  Diagnostic testing: R knee XR- 02/25/20  Pertinent review of systems: No fevers or chills  Relevant historical information: Recurrent pancreatitis multiple drug allergies.   Exam:  BP 114/78 (BP Location: Left Arm, Patient Position: Sitting, Cuff Size: Large)   Pulse 76   Ht 5\' 1"  (1.549 m)   Wt 219 lb 3.2 oz (99.4 kg)   SpO2 96%   BMI 41.42 kg/m  General: Well Developed, well nourished, and in no acute distress.   MSK: L-spine nontender midline normal lumbar motion. Right knee normal-appearing nontender normal motion with mild crepitation.  Stable ligamentous exam.    Lab and Radiology Results  EXAM: RIGHT KNEE 3 VIEWS  COMPARISON:  None.  FINDINGS: No fracture, joint effusion or dislocation. No suspicious focal osseous lesions. Minimal tricompartmental osteoarthritis with tiny marginal osteophytes. No radiopaque foreign bodies.  IMPRESSION: Minimal tricompartmental right knee osteoarthritis.   Electronically Signed   By: Ilona Sorrel M.D.   On: 02/26/2020 16:57  I, Lynne Blackburn, personally (independently) visualized and performed the interpretation of the images attached in this note.  CT scan images abdomen and pelvis with special focus on L-spine and sacrum from Jan 21, 2020 reviewed.  No severe degenerative changes.  Patient does have some thoracic osteophyte and DDD.   Assessment and  Plan: 69 y.o. female with right knee pain significant improvement with physical therapy.  Plan to continue home exercise program and recheck back with me as needed.  Could reauthorize PT in the future if needed.  Mild L-spine pain.  Mild degenerative changes and myofascial strain and spasm.  Again PT.  Happy to reauthorize in the future if needed.    Discussed warning signs or symptoms. Please see discharge instructions. Patient expresses understanding.   The above documentation has been reviewed and is accurate and complete Lynne Blackburn, M.D.

## 2020-03-25 ENCOUNTER — Encounter: Payer: Medicare Other | Admitting: Physical Therapy

## 2020-03-29 ENCOUNTER — Other Ambulatory Visit: Payer: Self-pay

## 2020-03-29 ENCOUNTER — Ambulatory Visit (INDEPENDENT_AMBULATORY_CARE_PROVIDER_SITE_OTHER): Payer: Medicare Other | Admitting: Physical Therapy

## 2020-03-29 ENCOUNTER — Encounter: Payer: Self-pay | Admitting: Physical Therapy

## 2020-03-29 DIAGNOSIS — G8929 Other chronic pain: Secondary | ICD-10-CM | POA: Diagnosis not present

## 2020-03-29 DIAGNOSIS — M25561 Pain in right knee: Secondary | ICD-10-CM

## 2020-03-29 NOTE — Patient Instructions (Signed)
Access Code: [email or web address] URL: [email or web address] Date: 03/29/2020 Prepared by: Lyndee Hensen  Exercises Supine Quadricep Sets - 2 x daily - 2 sets - 10 reps - 5 hold Seated Knee Extension AROM - 2 x daily - 2 sets - 10 reps Seated Hamstring Stretch - 2 x daily - 3 reps - 30 hold Straight Leg Raise - 2 x daily - 2 sets - 10 reps Supine Single Knee to Chest Stretch - 2 x daily - 3 reps - 30 hold Supine Posterior Pelvic Tilt - 2 x daily - 2 sets - 10 reps Supine Lower Trunk Rotation - 1 x daily - 10 reps - 10 hold Standing March with Counter Support - 1 x daily - 2 sets - 10 reps Standing Hip Abduction with Counter Support - 1 x daily - 2 sets - 10 reps Standing Tandem Balance with Counter Support - 1 x daily - 3 reps - 30 hold

## 2020-03-29 NOTE — Therapy (Signed)
[facility] [address] [place], [place], [address] Phone: [phone]   Fax:  [phone]  Physical Therapy Treatment/Discharge    Patient Details  Name: Linda Blackburn MRN: [id number] Date of Birth: 06-02-1951 Referring Provider (PT): Lynne Leader   Encounter Date: 03/29/2020   PT End of Session - 03/29/20 1307    Visit Number 7    Number of Visits 12    Date for PT Re-Evaluation 04/12/20    Authorization Type Medicare    PT Start Time 1300    PT Stop Time 1340    PT Time Calculation (min) 40 min    Activity Tolerance Patient tolerated treatment well    Behavior During Therapy [facility] for tasks assessed/performed           Past Medical History:  Diagnosis Date  . Abnormal uterine bleeding   . Amenorrhea   . Anxiety   . Depression   . Gallstones   . Hay fever   . HTN (hypertension)   . Pancreatitis   . Urine incontinence   . UTI (urinary tract infection)    reoccuring     Past Surgical History:  Procedure Laterality Date  . ABDOMINAL HYSTERECTOMY  1984   fibroids  . APPENDECTOMY  1984  . BILIARY DILATION  05/29/2019   Procedure: BILIARY DILATION;  Surgeon: Rush Landmark Telford Nab., MD;  Location: [facility];  Service: Gastroenterology;;  . BIOPSY  05/29/2019   Procedure: BIOPSY;  Surgeon: Irving Copas., MD;  Location: [facility];  Service: Gastroenterology;;  . [name]  09/08/1999  . ERCP N/A 05/29/2019   Procedure: ENDOSCOPIC RETROGRADE CHOLANGIOPANCREATOGRAPHY (ERCP);  Surgeon: Irving Copas., MD;  Location: [facility];  Service: Gastroenterology;  Laterality: N/A;  . ERCP W/ SPHINCTEROTOMY AND BALLOON DILATION  08/2008  . ESOPHAGOGASTRODUODENOSCOPY (EGD) WITH PROPOFOL N/A 05/29/2019   Procedure: ESOPHAGOGASTRODUODENOSCOPY (EGD) WITH PROPOFOL;  Surgeon: Rush Landmark Telford Nab., MD;  [facility]: [facility];  Service: Gastroenterology;  Laterality: N/A;  . OOPHORECTOMY     Only 1 was  taken  . POLYPECTOMY  05/29/2019   Procedure: POLYPECTOMY;  Surgeon: Mansouraty, Telford Nab., MD;  Location: [facility];  Service: Gastroenterology;;  . REMOVAL OF STONES  05/29/2019   Procedure: REMOVAL OF STONES;  Surgeon: Irving Copas., MD;  [facility];  Service: Gastroenterology;;  . TONSILLECTOMY    . TUBAL LIGATION      There were no vitals filed for this visit.   Subjective Assessment - 03/29/20 1305    Subjective Pt states improving knee pain . Back has also been less sore, she has been sleeping on other side of mattress which she thinks has been helpful.    Currently in Pain? No/denies    Pain Score 0-No pain                             [facility] Adult PT Treatment/Exercise - 03/29/20 1309      Exercises   Exercises Knee/Hip      Knee/Hip Exercises: Stretches   Active Hamstring Stretch 2 reps;30 seconds    Active Hamstring Stretch Limitations seated    Other Knee/Hip Stretches SKTC 30 sec x 3, pelvic tilts x20;       Knee/Hip Exercises: Aerobic   Recumbent Bike L2 x 8 min       Knee/Hip Exercises: Standing   Hip Flexion 20 reps    Hip Abduction 2 sets;10 reps;Both  Forward Step Up --    Stairs up/down 5 steps no hand rail x 5;     SLS --    Other Standing Knee Exercises Tandem stance 30 sec x 2 bil;       Knee/Hip Exercises: Seated   Long Arc [employer] reps;Both    Long Arc Quad Weight 3 lbs.    Hamstring Curl --    Hamstring Limitations --      Knee/Hip Exercises: Supine   Short Arc Quad Sets --    Straight Leg Raises 10 reps;Both      Manual Therapy   Manual Therapy Joint mobilization;Soft tissue mobilization;Taping;Passive ROM    Passive ROM R knee flex/ext                  PT Education - 03/29/20 1357    Education Details Final HEP reviewed    Person(s) Educated Patient    Methods Explanation;Demonstration;Verbal cues;Handout    Comprehension Verbalized understanding;Returned demonstration;Verbal  cues required            PT Short Term Goals - 03/29/20 1307      PT SHORT TERM GOAL #1   Title Pt to be independent with initial HEP    Time 2    Period Weeks    Status Achieved    Target Date 03/15/20             PT Long Term Goals - 03/29/20 1308      PT LONG TERM GOAL #1   Title Pt to be independent with final HEP    Time 6    Period Weeks    Status Achieved      PT LONG TERM GOAL #2   Title Pt to report decreased pain in R knee to 0-1/10 with standing and walking activity    Time 6    Period Weeks    Status Achieved      PT LONG TERM GOAL #3   Title Pt to demo improved strength of R knee and hip to at least 4+/5 to improve stability and pain    Time 6    Period Weeks    Status Achieved      PT LONG TERM GOAL #4   Title Pt to demo ability for safe stair negotiation with 1 hand rail and pain no greater than 1/10.    Time 6    Period Weeks    Status Achieved      PT LONG TERM GOAL #5   Title Pt to report ability for ambulation up to 30 min, with pain in knee no greater than 1/10 , for improved ability for exercise.    Time 6    Period Weeks    Status Achieved                 Plan - 03/29/20 1357    Clinical Impression Statement Pt has made good improvments with knee pain. She states mild "twinges" at times with standing activities. She has improved strength, ability for stairs, ability for HEP, and overall improved function. She has met goals at this time and is ready for d/c to HEP. Final HEp reviewed today. Pt also with improving back pain. Also has HEP for back stretches. Discussed f/u with MD in future for back if needed. Ready for d/c at this time.    Personal Factors and Comorbidities Fitness;Comorbidity 1;Time since onset of injury/illness/exacerbation    Examination-Activity Limitations Lift;Stand;Locomotion Level;Bend;Stairs;Squat    Examination-Participation  Restrictions Church;Cleaning;Community Activity;Driving;Shop;Laundry     Stability/Clinical Decision Making Stable/Uncomplicated    Rehab Potential Good    PT Frequency 2x / week    PT Duration 6 weeks    PT Treatment/Interventions ADLs/Self Care Home Management;Cryotherapy;Iontophoresis 13m/ml Dexamethasone;Moist Heat;Ultrasound;DME Instruction;Stair training;Gait training;Functional mobility training;Therapeutic activities;Therapeutic exercise;Orthotic Fit/Training;Patient/family education;Neuromuscular re-education;Balance training;Passive range of motion;Dry needling;Vasopneumatic Device;Taping;Spinal Manipulations;Joint Manipulations;Manual techniques    PT Home Exercise Plan [id number]    Consulted and Agree with Plan of Care Patient           Patient will benefit from skilled therapeutic intervention in order to improve the following deficits and impairments:  Pain, Decreased mobility, Increased muscle spasms, Decreased activity tolerance, Decreased range of motion, Decreased strength, Impaired flexibility, Difficulty walking  Visit Diagnosis: Chronic pain of right knee     Problem List Patient Active Problem List   Diagnosis Date Noted  . Acute pancreatitis 01/21/2020  . Gastroesophageal reflux disease   . Basal cell carcinoma (BCC) in situ of skin 10/11/2018  . Stress incontinence 10/11/2018  . Hypotension (arterial) 08/09/2018  . Sinus bradycardia on ECG 08/09/2018  . AKI (acute kidney injury) ([facility] 08/09/2018  . Recurrent acute pancreatitis 08/08/2018  . Nausea & vomiting 03/07/2018  . Sepsis ([facility] 01/01/2018  . Alternating constipation and diarrhea 09/26/2016  . Obesity (BMI 30-39.9) 07/20/2014  . Chronic insomnia 06/16/2014  . Hypokalemia 06/10/2014  . Volume overload 06/10/2014  . UTI (urinary tract infection) 06/10/2014  . Leukocytosis 06/10/2014  . Acute respiratory failure with hypoxia ([facility] 06/10/2014  . Acute recurrent pancreatitis 06/06/2014  . Pancreatitis 06/06/2014  . Hypertension 09/11/2012  . Adjustment disorder with  mixed anxiety and depressed mood 09/11/2012  . Pancreas divisum 01/18/2011  . URI 09/02/2010  . Depression, recurrent ([facility] 04/01/2010  . PANCREATITIS, HX OF 04/01/2010    LLyndee Hensen PT, DPT 2:01 PM  03/29/20    [facility] [place] [address]Phone: [phone]  Fax:  [phone] Name: CDiannia HogensonMRN: [id number] of Birth: 408-13-1952  PHYSICAL THERAPY DISCHARGE SUMMARY  Visits from Start of Care: 7 Plan: Patient agrees to discharge.  Patient goals were met. Patient is being discharged due to meeting the stated rehab goals.  ?????    LLyndee Hensen PT, DPT 2:01 PM  03/29/20

## 2020-03-31 ENCOUNTER — Ambulatory Visit: Payer: Medicare Other | Admitting: Gastroenterology

## 2020-04-06 ENCOUNTER — Ambulatory Visit: Payer: Self-pay | Admitting: *Deleted

## 2020-04-08 ENCOUNTER — Other Ambulatory Visit: Payer: Self-pay | Admitting: *Deleted

## 2020-04-08 NOTE — Patient Outreach (Signed)
[facility]) Care Management  04/08/2020  Grizelda Piscopo February 04, 1951 [id number]  LATE ENTRY  CSW attempted outreach call to pt on 04/07/2020 and was unable to reach.  CSW left a voice message for return call and will attempt outreach again in 3-4 business days per policy.    Eduard Clos, MSW, [place] Worker  [place] [phone]

## 2020-04-13 ENCOUNTER — Ambulatory Visit: Payer: Self-pay | Admitting: *Deleted

## 2020-04-15 ENCOUNTER — Other Ambulatory Visit: Payer: Self-pay | Admitting: *Deleted

## 2020-04-15 ENCOUNTER — Other Ambulatory Visit: Payer: Self-pay | Admitting: Family Medicine

## 2020-04-15 NOTE — Patient Outreach (Signed)
[facility]) Care Management  04/15/2020  Avonelle Viveros 06-23-1951 [id number]   CSW attempted to reach pt for followup and was unable to reach.  CSW left a HIPPA compliant voice message and will try again in 3-4 business days per policy if no return call is received.   Eduard Clos, MSW, [place] Worker  [place] [phone]

## 2020-04-15 NOTE — Telephone Encounter (Signed)
Pt is calling in stating that she will be out in the next few days of Rx's pantoprazole (PROTONIX) 40 MG #90 and fluconazole (DIFLUCAN) 150 MG #12.   Pharm: [facility].

## 2020-04-16 ENCOUNTER — Other Ambulatory Visit: Payer: Self-pay | Admitting: *Deleted

## 2020-04-16 NOTE — Telephone Encounter (Signed)
May refill Protonix for 1 year.  Not sure why she is requesting the fluconazole?

## 2020-04-16 NOTE — Telephone Encounter (Signed)
Please advise on diflucan

## 2020-04-16 NOTE — Patient Outreach (Signed)
[facility]) Care Management  04/16/2020  Linda Blackburn 01-09-1951 [id number]   CSW spoke with pt who reports she had a pancreatitis flare 2 weeks ago but handled it ok.  Pt shares that shs is equipped with a plan for groceries, meds, etc if she becomes sick and unable to drive and needs items.  CSW did talk to pt about potential needs she may face for assistance (if she is in need of transportation to appointments and/or needs in home help) and the plan for her to reach out to CSW as the need(s) arise.  Pt appreciative of the calls and support- pt requests follow up in October and is reminded to call if needs arise before that time.    Eduard Clos, MSW, [place] Worker  [place] [phone]

## 2020-04-20 MED ORDER — PANTOPRAZOLE SODIUM 40 MG PO TBEC: 40.0000 mg | DELAYED_RELEASE_TABLET | Freq: Every day | ORAL | 3 refills | Status: DC

## 2020-04-20 NOTE — Addendum Note (Signed)
Addended by: Nathanial Millman E on: 04/20/2020 12:06 PM   Modules accepted: Orders

## 2020-04-21 DIAGNOSIS — H5203 Hypermetropia, bilateral: Secondary | ICD-10-CM | POA: Diagnosis not present

## 2020-04-21 DIAGNOSIS — H2513 Age-related nuclear cataract, bilateral: Secondary | ICD-10-CM | POA: Diagnosis not present

## 2020-05-17 ENCOUNTER — Other Ambulatory Visit: Payer: Self-pay | Admitting: Family Medicine

## 2020-05-17 DIAGNOSIS — Z1231 Encounter for screening mammogram for malignant neoplasm of breast: Secondary | ICD-10-CM

## 2020-06-07 ENCOUNTER — Other Ambulatory Visit: Payer: Self-pay | Admitting: *Deleted

## 2020-06-07 DIAGNOSIS — Z23 Encounter for immunization: Secondary | ICD-10-CM | POA: Diagnosis not present

## 2020-06-07 NOTE — Patient Outreach (Signed)
[facility]) Care Management  06/07/2020  Aleeya Veitch June 03, 1951 [id number]   CSW spoke with pt who reports she has been doing well; no issues since August with flare ups.  She is hoping to be able to go visit her children over the holidays and reports receiving her COVID Booster and FLU shots today.   Pt denies any current needs or concerns. Pt appreciative of support and follow up and is comfortable with CSW signing off and she will outreach if needs arise.  Pt has CSW saved in her phone.  CSW will close case and advise PCP and [facility] team of above.   Eduard Clos, MSW, [place] Worker  [place] [phone]

## 2020-06-30 ENCOUNTER — Ambulatory Visit: Payer: Medicare Other

## 2020-08-05 DIAGNOSIS — R350 Frequency of micturition: Secondary | ICD-10-CM | POA: Diagnosis not present

## 2020-08-05 DIAGNOSIS — N3946 Mixed incontinence: Secondary | ICD-10-CM | POA: Diagnosis not present

## 2020-08-09 ENCOUNTER — Ambulatory Visit
Admission: RE | Admit: 2020-08-09 | Discharge: 2020-08-09 | Disposition: A | Discharge status: Home / Self Care | Payer: Medicare Other | Source: Ambulatory Visit | Attending: Family Medicine | Admitting: Family Medicine

## 2020-08-09 ENCOUNTER — Other Ambulatory Visit: Payer: Self-pay

## 2020-08-09 DIAGNOSIS — Z1231 Encounter for screening mammogram for malignant neoplasm of breast: Secondary | ICD-10-CM | POA: Diagnosis not present

## 2020-08-16 ENCOUNTER — Ambulatory Visit (INDEPENDENT_AMBULATORY_CARE_PROVIDER_SITE_OTHER): Payer: Medicare Other | Admitting: Family Medicine

## 2020-08-16 ENCOUNTER — Encounter: Payer: Self-pay | Admitting: Family Medicine

## 2020-08-16 ENCOUNTER — Other Ambulatory Visit: Payer: Self-pay

## 2020-08-16 VITALS — BP 110/68 | HR 95 | Temp 98.4°F | Ht 61.0 in | Wt 216.0 lb

## 2020-08-16 DIAGNOSIS — L304 Erythema intertrigo: Secondary | ICD-10-CM

## 2020-08-16 DIAGNOSIS — F339 Major depressive disorder, recurrent, unspecified: Secondary | ICD-10-CM

## 2020-08-16 DIAGNOSIS — Z780 Asymptomatic menopausal state: Secondary | ICD-10-CM | POA: Diagnosis not present

## 2020-08-16 DIAGNOSIS — I10 Essential (primary) hypertension: Secondary | ICD-10-CM

## 2020-08-16 DIAGNOSIS — K859 Acute pancreatitis without necrosis or infection, unspecified: Secondary | ICD-10-CM | POA: Diagnosis not present

## 2020-08-16 DIAGNOSIS — L918 Other hypertrophic disorders of the skin: Secondary | ICD-10-CM | POA: Diagnosis not present

## 2020-08-16 DIAGNOSIS — Q828 Other specified congenital malformations of skin: Secondary | ICD-10-CM

## 2020-08-16 MED ORDER — LOSARTAN POTASSIUM 100 MG PO TABS
100.0000 mg | ORAL_TABLET | Freq: Every day | ORAL | 3 refills | Status: DC
Start: 2020-08-16 — End: 2021-07-01

## 2020-08-16 MED ORDER — SERTRALINE HCL 100 MG PO TABS
ORAL_TABLET | ORAL | 3 refills | Status: DC
Start: 2020-08-16 — End: 2021-07-01

## 2020-08-16 MED ORDER — FLUCONAZOLE 100 MG PO TABS
100.0000 mg | ORAL_TABLET | Freq: Every day | ORAL | 1 refills | Status: DC | PRN
Start: 2020-08-16 — End: 2021-08-17

## 2020-08-16 MED ORDER — ONDANSETRON 4 MG PO TBDP
4.0000 mg | ORAL_TABLET | Freq: Three times a day (TID) | ORAL | 3 refills | Status: DC | PRN
Start: 2020-08-16 — End: 2021-04-11

## 2020-08-16 NOTE — Progress Notes (Signed)
Established Patient Office Visit  Subjective:  Patient ID: Linda Blackburn, female    DOB: May 01, 1951  Age: 69 y.o. MRN: [id number]  CC:  Chief Complaint  Patient presents with  . Follow-up    HPI Linda Blackburn presents for medication management and follow-up.  She has history of hypertension, history of acute recurrent pancreatitis, chronic intermittent nausea, history of recurrent depression, chronic insomnia.  She is requesting refills of several medications.  She needs losartan for hypertension.  Blood pressures have been stable.  No recent dizziness.  She requests refills of Zofran which she takes for intermittent nausea.  She has had some intertrigo rash in the past and used fluconazole for that and requesting refills.  She has history of recurrent depression.  She feels her depression is relatively stable but has been more anxious recently and questions whether she could titrate up her sertraline to 150 mg.    She is also requesting order for repeat DEXA scan.  She had DEXA scan in the past but several years ago.  No recent falls or fractures.  She has not had a flareup since last summer of her acute pancreatitis and this for her is a long interval.  She has been very excited about that.  She has some anterior neck skin tags which are irritating because of location.  They frequently rub against her jewelry.  She is requesting that these be treated with liquid nitrogen.  She has had similar treatments in the past and tolerated well  Past Medical History:  Diagnosis Date  . Abnormal uterine bleeding   . Amenorrhea   . Anxiety   . Depression   . Gallstones   . Hay fever   . HTN (hypertension)   . Pancreatitis   . Urine incontinence   . UTI (urinary tract infection)    reoccuring     Past Surgical History:  Procedure Laterality Date  . ABDOMINAL HYSTERECTOMY  1984   fibroids  . APPENDECTOMY  1984  . BILIARY DILATION  05/29/2019   Procedure: BILIARY DILATION;   Surgeon: Rush Landmark Telford Nab., MD;  [facility]: [facility];  Service: Gastroenterology;;  . BIOPSY  05/29/2019   Procedure: BIOPSY;  Surgeon: Irving Copas., MD;  [facility];  Service: Gastroenterology;;  . [name]  09/08/1999  . ERCP N/A 05/29/2019   Procedure: ENDOSCOPIC RETROGRADE CHOLANGIOPANCREATOGRAPHY (ERCP);  Surgeon: Irving Copas., MD;  Location: [facility];  Service: Gastroenterology;  Laterality: N/A;  . ERCP W/ SPHINCTEROTOMY AND BALLOON DILATION  08/2008  . ESOPHAGOGASTRODUODENOSCOPY (EGD) WITH PROPOFOL N/A 05/29/2019   Procedure: ESOPHAGOGASTRODUODENOSCOPY (EGD) WITH PROPOFOL;  Surgeon: Rush Landmark Telford Nab., MD;  [facility];  Service: Gastroenterology;  Laterality: N/A;  . OOPHORECTOMY     Only 1 was taken  . POLYPECTOMY  05/29/2019   Procedure: POLYPECTOMY;  Surgeon: Mansouraty, Telford Nab., MD;  Location: [facility];  Service: Gastroenterology;;  . REMOVAL OF STONES  05/29/2019   Procedure: REMOVAL OF STONES;  Surgeon: Irving Copas., MD;  [facility];  Service: Gastroenterology;;  . TONSILLECTOMY    . TUBAL LIGATION      Family History  Problem Relation Age of Onset  . Alcohol abuse Mother   . Prostate cancer Father   . Stroke Maternal Aunt   . Hiatal hernia Maternal Aunt   . Prostate cancer Paternal Grandfather   . Cerebral palsy Granddaughter   . Breast cancer Neg Hx     Social History   Socioeconomic History  .  Marital status: Widowed    Spouse name: Not on file  . Number of children: 2  . Years of education: Not on file  . Highest education level: Not on file  Occupational History  . Occupation: retired  Tobacco Use  . Smoking status: Never Smoker  . Smokeless tobacco: Never Used  Vaping Use  . Vaping Use: Never used  Substance and Sexual Activity  . Alcohol use: Yes    Alcohol/week: 0.0 standard drinks    Comment: occ - hardly every   . Drug use: No  . Sexual activity: Not  Currently    Partners: Male    Birth control/protection: Surgical  Other Topics Concern  . Not on file  Social History Narrative   10/14/2019: Lives alone in apartment.    Has two sons, one in [place] and one in [place], 5 grandchildren    Has considered moving closer to sons but likes [place] too much.   Neighbors good social support   Enjoys watching soap operas, walking when she feels well, but recently struggling with more frequent episodes of pancreatitis requiring hospitalization.   Keeps positive attitude, "one day at a time"      Social Determinants of Health   Financial Resource Strain: Low Risk   . Difficulty of Paying Living Expenses: Not hard at all  Food Insecurity: No Food Insecurity  . Worried About Charity fundraiser in the Last Year: Never true  . Ran Out of Food in the Last Year: Never true  Transportation Needs: No Transportation Needs  . Lack of Transportation (Medical): No  . Lack of Transportation (Non-Medical): No  Physical Activity: Insufficiently Active  . Days of Exercise per Week: 3 days  . Minutes of Exercise per Session: 20 min  Stress: Stress Concern Present  . Feeling of Stress : To some extent  Social Connections: Unknown  . Frequency of Communication with Friends and Family: More than three times a week  . Frequency of Social Gatherings with Friends and Family: Once a week  . Attends Religious Services: Not on file  . Active Member of Clubs or Organizations: Yes  . Attends Archivist Meetings: Not on file  . Marital Status: Widowed  Intimate Partner Violence: Not on file    Outpatient Medications Prior to Visit  Medication Sig Dispense Refill  . mirabegron ER (MYRBETRIQ) 50 MG TB24 tablet Take 1 tablet (50 mg total) by mouth daily. 90 tablet 3  . pantoprazole (PROTONIX) 40 MG tablet Take 1 tablet (40 mg total) by mouth daily at 6 (six) AM. 90 tablet 3  . zolpidem (AMBIEN) 5 MG tablet Take 1 tablet (5 mg total) by mouth at bedtime as needed for  sleep. 30 tablet 0  . fluconazole (DIFLUCAN) 100 MG tablet Take 100 mg by mouth daily as needed.    Marland Kitchen losartan (COZAAR) 100 MG tablet Take 1 tablet (100 mg total) by mouth daily. 90 tablet 3  . ondansetron (ZOFRAN-ODT) 4 MG disintegrating tablet Take 1 tablet (4 mg total) by mouth every 8 (eight) hours as needed. (Patient taking differently: Take 4 mg by mouth every 8 (eight) hours as needed for nausea or vomiting.) 30 tablet 3  . sertraline (ZOLOFT) 100 MG tablet Take 1 tablet (100 mg total) by mouth daily. (Patient taking differently: Take 150 mg by mouth daily. Take one and one half tablets by mouth daily.) 90 tablet 3   No facility-administered medications prior to visit.    Allergies  Allergen  Reactions  . Other Hives and Swelling    AQUASONIC [place] GEL  . Buprenorphine Hcl     "In a scarry movie" weird thoughts  . Codeine     GI upset  . Dilaudid [Hydromorphone Hcl] Itching  . Morphine And Related     "In a scarry movie" weird thoughts    ROS Review of Systems  Constitutional: Negative for fatigue.  Eyes: Negative for visual disturbance.  Respiratory: Negative for cough, chest tightness, shortness of breath and wheezing.   Cardiovascular: Negative for chest pain, palpitations and leg swelling.  Endocrine: Negative for polydipsia and polyuria.  Neurological: Negative for dizziness, seizures, syncope, weakness, light-headedness and headaches.      Objective:    Physical Exam Constitutional:      Appearance: She is well-developed and well-nourished.  Eyes:     Pupils: Pupils are equal, round, and reactive to light.  Neck:     Thyroid: No thyromegaly.     Vascular: No JVD.  Cardiovascular:     Rate and Rhythm: Normal rate and regular rhythm.     Heart sounds: No gallop.   Pulmonary:     Effort: Pulmonary effort is normal. No respiratory distress.     Breath sounds: Normal breath sounds. No wheezing or rales.  Musculoskeletal:        General: No edema.     Cervical  back: Neck supple.     Right lower leg: No edema.     Left lower leg: No edema.  Skin:    Comments: She has couple small skin tags including 2 on the right side the neck and one on left side which are irritating because of location and rubbing against jewelry.  These are all very small- approximately 1 mm at the base  Neurological:     Mental Status: She is alert.     BP 110/68 (BP Location: Left Arm, Patient Position: Sitting, Cuff Size: Large)   Pulse 95   Temp 98.4 F (36.9 C) (Temporal)   Ht 5\' 1"  (1.549 m)   Wt 216 lb (98 kg)   SpO2 96%   BMI 40.81 kg/m  Wt Readings from Last 3 Encounters:  08/16/20 216 lb (98 kg)  03/24/20 219 lb 3.2 oz (99.4 kg)  02/25/20 217 lb 6.4 oz (98.6 kg)     Health Maintenance Due  Topic Date Due  . COLONOSCOPY  11/03/2019  . TETANUS/TDAP  04/01/2020    There are no preventive care reminders to display for this patient.  Lab Results  Component Value Date   TSH 1.47 01/16/2018   Lab Results  Component Value Date   WBC 10.0 01/22/2020   HGB 13.2 01/22/2020   HCT 41.9 01/22/2020   MCV 92.5 01/22/2020   PLT 189 01/22/2020   Lab Results  Component Value Date   NA 137 01/25/2020   K 3.7 01/25/2020   CO2 25 01/25/2020   GLUCOSE 71 01/25/2020   BUN 7 (L) 01/25/2020   CREATININE 0.61 01/25/2020   BILITOT 1.3 (H) 01/25/2020   ALKPHOS 76 01/25/2020   AST 20 01/25/2020   ALT 17 01/25/2020   PROT 6.3 (L) 01/25/2020   ALBUMIN 3.4 (L) 01/25/2020   CALCIUM 8.7 (L) 01/25/2020   ANIONGAP 9 01/25/2020   GFR 89.49 08/19/2018   Lab Results  Component Value Date   CHOL 154 01/24/2020   Lab Results  Component Value Date   HDL 35 (L) 01/24/2020   Lab Results  Component Value Date  [place] 98 01/24/2020   Lab Results  Component Value Date   TRIG 103 01/24/2020   Lab Results  Component Value Date   CHOLHDL 4.4 01/24/2020   No results found for: HGBA1C    Assessment & Plan:   #1 hypertension-currently stable -Continue  losartan 100 mg daily with refills given for 1 year  #2 history of recurrent depression.  She has had some recent increased anxiety symptoms.  We discussed titrating her sertraline up to 150 mg with new prescription sent to reflect this dosage.  She will give some feedback in a few weeks  #3 history of recurrent intertrigo.  She has responded well to fluconazole in the past.  We refilled fluconazole 100 mg 1 daily for 7 days as needed for flareups  #4 irritated skin tags anterior neck.  She had a total of 3 skin tags that she wishes to have treatment today.  These appear totally benign.  We discussed risks including pain, low risk of blistering, and very low risk of infection and patient consented.  We treated a total of 3 without difficulty    Meds ordered this encounter  Medications  . losartan (COZAAR) 100 MG tablet    Sig: Take 1 tablet (100 mg total) by mouth daily.    Dispense:  90 tablet    Refill:  3  . sertraline (ZOLOFT) 100 MG tablet    Sig: Take one and one half tablet by mouth once daily    Dispense:  135 tablet    Refill:  3  . fluconazole (DIFLUCAN) 100 MG tablet    Sig: Take 1 tablet (100 mg total) by mouth daily as needed.    Dispense:  20 tablet    Refill:  1  . ondansetron (ZOFRAN-ODT) 4 MG disintegrating tablet    Sig: Take 1 tablet (4 mg total) by mouth every 8 (eight) hours as needed.    Dispense:  40 tablet    Refill:  3    Follow-up: Return in about 6 months (around 02/14/2021).    Carolann Littler, MD

## 2020-08-18 ENCOUNTER — Ambulatory Visit: Payer: Medicare Other | Admitting: Family Medicine

## 2020-09-29 ENCOUNTER — Ambulatory Visit (INDEPENDENT_AMBULATORY_CARE_PROVIDER_SITE_OTHER)
Admission: RE | Admit: 2020-09-29 | Discharge: 2020-09-29 | Disposition: A | Discharge status: Home / Self Care | Payer: Medicare Other | Source: Ambulatory Visit | Attending: Family Medicine | Admitting: Family Medicine

## 2020-09-29 ENCOUNTER — Other Ambulatory Visit: Payer: Self-pay

## 2020-09-29 DIAGNOSIS — Z780 Asymptomatic menopausal state: Secondary | ICD-10-CM

## 2020-10-04 NOTE — Progress Notes (Signed)
Mychart message sent: Bone density just barely in the osteopenia range.  Continue daily calcium 1200 mg and vitamin D 1000 international units along with regular weightbearing exercise such as walking.

## 2020-11-02 ENCOUNTER — Ambulatory Visit: Payer: Medicare Other

## 2020-11-02 NOTE — Patient Instructions (Incomplete)
Linda Blackburn , Thank you for taking time to come for your Medicare Wellness Visit. I appreciate your ongoing commitment to your health goals. Please review the following plan we discussed and let me know if I can assist you in the future.   Screening recommendations/referrals: Colonoscopy: Currently due, orders placed this visit Mammogram: Up to date, next due 08/09/2021 Bone Density: Up to date, next due 09/29/2025 Recommended yearly ophthalmology/optometry visit for glaucoma screening and checkup Recommended yearly dental visit for hygiene and checkup  Vaccinations: Influenza vaccine: Up to date, next due fall 2022  Pneumococcal vaccine: Completed series Tdap vaccine: Currently due, you may await and injury or receive at your local pharmacy Shingles vaccine: Currently due, if you would like to receive we recommend that you do so at your local pharmacy as it is less expensive     Advanced directives:   Conditions/risks identified: None   Next appointment: ***   Preventive Care 65 Years and Older, Female Preventive care refers to lifestyle choices and visits with your health care provider that can promote health and wellness. What does preventive care include?  A yearly physical exam. This is also called an annual well check.  Dental exams once or twice a year.  Routine eye exams. Ask your health care provider how often you should have your eyes checked.  Personal lifestyle choices, including:  Daily care of your teeth and gums.  Regular physical activity.  Eating a healthy diet.  Avoiding tobacco and drug use.  Limiting alcohol use.  Practicing safe sex.  Taking low-dose aspirin every day.  Taking vitamin and mineral supplements as recommended by your health care provider. What happens during an annual well check? The services and screenings done by your health care provider during your annual well check will depend on your age, overall health, lifestyle risk  factors, and family history of disease. Counseling  Your health care provider may ask you questions about your:  Alcohol use.  Tobacco use.  Drug use.  Emotional well-being.  Home and relationship well-being.  Sexual activity.  Eating habits.  History of falls.  Memory and ability to understand (cognition).  Work and work Statistician.  Reproductive health. Screening  You may have the following tests or measurements:  Height, weight, and BMI.  Blood pressure.  Lipid and cholesterol levels. These may be checked every 5 years, or more frequently if you are over 67 years old.  Skin check.  Lung cancer screening. You may have this screening every year starting at age 23 if you have a 30-pack-year history of smoking and currently smoke or have quit within the past 15 years.  Fecal occult blood test (FOBT) of the stool. You may have this test every year starting at age 3.  Flexible sigmoidoscopy or colonoscopy. You may have a sigmoidoscopy every 5 years or a colonoscopy every 10 years starting at age 58.  Hepatitis C blood test.  Hepatitis B blood test.  Sexually transmitted disease (STD) testing.  Diabetes screening. This is done by checking your blood sugar (glucose) after you have not eaten for a while (fasting). You may have this done every 1-3 years.  Bone density scan. This is done to screen for osteoporosis. You may have this done starting at age 81.  Mammogram. This may be done every 1-2 years. Talk to your health care provider about how often you should have regular mammograms. Talk with your health care provider about your test results, treatment options, and if necessary, the  need for more tests. Vaccines  Your health care provider may recommend certain vaccines, such as:  Influenza vaccine. This is recommended every year.  Tetanus, diphtheria, and acellular pertussis (Tdap, Td) vaccine. You may need a Td booster every 10 years.  Zoster vaccine. You  may need this after age 70.  Pneumococcal 13-valent conjugate (PCV13) vaccine. One dose is recommended after age 36.  Pneumococcal polysaccharide (PPSV23) vaccine. One dose is recommended after age 22. Talk to your health care provider about which screenings and vaccines you need and how often you need them. This information is not intended to replace advice given to you by your health care provider. Make sure you discuss any questions you have with your health care provider. Document Released: 09/17/2015 Document Revised: 05/10/2016 Document Reviewed: 06/22/2015 Elsevier Interactive Patient Education  2017 [place] Prevention in the Home Falls can cause injuries. They can happen to people of all ages. There are many things you can do to make your home safe and to help prevent falls. What can I do on the outside of my home?  Regularly fix the edges of walkways and driveways and fix any cracks.  Remove anything that might make you trip as you walk through a door, such as a raised step or threshold.  Trim any bushes or trees on the path to your home.  Use bright outdoor lighting.  Clear any walking paths of anything that might make someone trip, such as rocks or tools.  Regularly check to see if handrails are loose or broken. Make sure that both sides of any steps have handrails.  Any raised decks and porches should have guardrails on the edges.  Have any leaves, snow, or ice cleared regularly.  Use sand or salt on walking paths during winter.  Clean up any spills in your garage right away. This includes oil or grease spills. What can I do in the bathroom?  Use night lights.  Install grab bars by the toilet and in the tub and shower. Do not use towel bars as grab bars.  Use non-skid mats or decals in the tub or shower.  If you need to sit down in the shower, use a plastic, non-slip stool.  Keep the floor dry. Clean up any water that spills on the floor as soon as  it happens.  Remove soap buildup in the tub or shower regularly.  Attach bath mats securely with double-sided non-slip rug tape.  Do not have throw rugs and other things on the floor that can make you trip. What can I do in the bedroom?  Use night lights.  Make sure that you have a light by your bed that is easy to reach.  Do not use any sheets or blankets that are too big for your bed. They should not hang down onto the floor.  Have a firm chair that has side arms. You can use this for support while you get dressed.  Do not have throw rugs and other things on the floor that can make you trip. What can I do in the kitchen?  Clean up any spills right away.  Avoid walking on wet floors.  Keep items that you use a lot in easy-to-reach places.  If you need to reach something above you, use a strong step stool that has a grab bar.  Keep electrical cords out of the way.  Do not use floor polish or wax that makes floors slippery. If you must use wax,  use non-skid floor wax.  Do not have throw rugs and other things on the floor that can make you trip. What can I do with my stairs?  Do not leave any items on the stairs.  Make sure that there are handrails on both sides of the stairs and use them. Fix handrails that are broken or loose. Make sure that handrails are as long as the stairways.  Check any carpeting to make sure that it is firmly attached to the stairs. Fix any carpet that is loose or worn.  Avoid having throw rugs at the top or bottom of the stairs. If you do have throw rugs, attach them to the floor with carpet tape.  Make sure that you have a light switch at the top of the stairs and the bottom of the stairs. If you do not have them, ask someone to add them for you. What else can I do to help prevent falls?  Wear shoes that:  Do not have high heels.  Have rubber bottoms.  Are comfortable and fit you well.  Are closed at the toe. Do not wear sandals.  If  you use a stepladder:  Make sure that it is fully opened. Do not climb a closed stepladder.  Make sure that both sides of the stepladder are locked into place.  Ask someone to hold it for you, if possible.  Clearly mark and make sure that you can see:  Any grab bars or handrails.  First and last steps.  Where the edge of each step is.  Use tools that help you move around (mobility aids) if they are needed. These include:  Canes.  Walkers.  Scooters.  Crutches.  Turn on the lights when you go into a dark area. Replace any light bulbs as soon as they burn out.  Set up your furniture so you have a clear path. Avoid moving your furniture around.  If any of your floors are uneven, fix them.  If there are any pets around you, be aware of where they are.  Review your medicines with your doctor. Some medicines can make you feel dizzy. This can increase your chance of falling. Ask your doctor what other things that you can do to help prevent falls. This information is not intended to replace advice given to you by your health care provider. Make sure you discuss any questions you have with your health care provider. Document Released: 06/17/2009 Document Revised: 01/27/2016 Document Reviewed: 09/25/2014 Elsevier Interactive Patient Education  2017 [employer].

## 2020-11-02 NOTE — Progress Notes (Unsigned)
Subjective:   Linda Blackburn is a 70 y.o. female who presents for Medicare Annual (Subsequent) preventive examination.  Review of Systems    N/A        Objective:    There were no vitals filed for this visit. There is no height or weight on file to calculate BMI.  Advanced Directives 03/03/2020 01/21/2020 01/21/2020 10/14/2019 05/27/2019 05/26/2019 11/19/2018  Does Patient Have a Medical Advance Directive? No Yes Yes Yes Yes Yes No  Type of Advance Directive - [place];Living will - [place];Living will [place];Living will [place];Living will -  Does patient want to make changes to medical advance directive? - No - Patient declined - No - Patient declined No - Patient declined No - Patient declined -  Copy of [place] in Chart? - No - copy requested - No - copy requested No - copy requested No - copy requested -  Would patient like information on creating a medical advance directive? No - Patient declined - - - - - No - Patient declined    Current Medications (verified) Outpatient Encounter Medications as of 11/02/2020  Medication Sig  . fluconazole (DIFLUCAN) 100 MG tablet Take 1 tablet (100 mg total) by mouth daily as needed.  Marland Kitchen losartan (COZAAR) 100 MG tablet Take 1 tablet (100 mg total) by mouth daily.  . mirabegron ER (MYRBETRIQ) 50 MG TB24 tablet Take 1 tablet (50 mg total) by mouth daily.  . ondansetron (ZOFRAN-ODT) 4 MG disintegrating tablet Take 1 tablet (4 mg total) by mouth every 8 (eight) hours as needed.  . pantoprazole (PROTONIX) 40 MG tablet Take 1 tablet (40 mg total) by mouth daily at 6 (six) AM.  . sertraline (ZOLOFT) 100 MG tablet Take one and one half tablet by mouth once daily  . zolpidem (AMBIEN) 5 MG tablet Take 1 tablet (5 mg total) by mouth at bedtime as needed for sleep.   No facility-administered encounter medications on file as of 11/02/2020.    Allergies  (verified) Other, Buprenorphine hcl, Codeine, Dilaudid [hydromorphone hcl], and Morphine and related   History: Past Medical History:  Diagnosis Date  . Abnormal uterine bleeding   . Amenorrhea   . Anxiety   . Depression   . Gallstones   . Hay fever   . HTN (hypertension)   . Pancreatitis   . Urine incontinence   . UTI (urinary tract infection)    reoccuring    Past Surgical History:  Procedure Laterality Date  . ABDOMINAL HYSTERECTOMY  1984   fibroids  . APPENDECTOMY  1984  . BILIARY DILATION  05/29/2019   Procedure: BILIARY DILATION;  Surgeon: Rush Landmark Telford Nab., MD;  Location: [facility];  Service: Gastroenterology;;  . BIOPSY  05/29/2019   Procedure: BIOPSY;  Surgeon: Irving Copas., MD;  Location: [facility];  Service: Gastroenterology;;  . [name]  09/08/1999  . ERCP N/A 05/29/2019   Procedure: ENDOSCOPIC RETROGRADE CHOLANGIOPANCREATOGRAPHY (ERCP);  Surgeon: Irving Copas., MD;  Location: [facility];  Service: Gastroenterology;  Laterality: N/A;  . ERCP W/ SPHINCTEROTOMY AND BALLOON DILATION  08/2008  . ESOPHAGOGASTRODUODENOSCOPY (EGD) WITH PROPOFOL N/A 05/29/2019   Procedure: ESOPHAGOGASTRODUODENOSCOPY (EGD) WITH PROPOFOL;  Surgeon: Rush Landmark Telford Nab., MD;  Location: [facility];  Service: Gastroenterology;  Laterality: N/A;  . OOPHORECTOMY     Only 1 was taken  . POLYPECTOMY  05/29/2019   Procedure: POLYPECTOMY;  Surgeon: Mansouraty, Telford Nab., MD;  Location:  [facility];  Service: Gastroenterology;;  . REMOVAL OF STONES  05/29/2019   Procedure: REMOVAL OF STONES;  Surgeon: Rush Landmark Telford Nab., MD;  Location: [facility];  Service: Gastroenterology;;  . TONSILLECTOMY    . TUBAL LIGATION     Family History  Problem Relation Age of Onset  . Alcohol abuse Mother   . Prostate cancer Father   . Stroke Maternal Aunt   . Hiatal hernia Maternal Aunt   . Prostate cancer Paternal Grandfather   . Cerebral palsy Granddaughter    . Breast cancer Neg Hx    Social History   Socioeconomic History  . Marital status: Widowed    Spouse name: Not on file  . Number of children: 2  . Years of education: Not on file  . Highest education level: Not on file  Occupational History  . Occupation: retired  Tobacco Use  . Smoking status: Never Smoker  . Smokeless tobacco: Never Used  Vaping Use  . Vaping Use: Never used  Substance and Sexual Activity  . Alcohol use: Yes    Alcohol/week: 0.0 standard drinks    Comment: occ - hardly every   . Drug use: No  . Sexual activity: Not Currently    Partners: Male    Birth control/protection: Surgical  Other Topics Concern  . Not on file  Social History Narrative   10/14/2019: Lives alone in apartment.    Has two sons, one in [place] and one in [place], 5 grandchildren    Has considered moving closer to sons but likes [place] too much.   Neighbors good social support   Enjoys watching soap operas, walking when she feels well, but recently struggling with more frequent episodes of pancreatitis requiring hospitalization.   Keeps positive attitude, "one day at a time"      Social Determinants of Radio broadcast assistant Strain: Not on file  Food Insecurity: Not on file  Transportation Needs: No Transportation Needs  . Lack of Transportation (Medical): No  . Lack of Transportation (Non-Medical): No  Physical Activity: Not on file  Stress: Not on file  Social Connections: Not on file    Tobacco Counseling Counseling given: Not Answered   Clinical Intake:                 Diabetic?No          Activities of Daily Living In your present state of health, do you have any difficulty performing the following activities: 01/21/2020  Hearing? N  Vision? N  Difficulty concentrating or making decisions? N  Walking or climbing stairs? Y  Comment gets short of breath  Dressing or bathing? N  Doing errands, shopping? N  Some recent data might be hidden    Patient Care  Team: [name], MD as PCP - General Milus Banister, MD as Attending Physician (Gastroenterology) Katy Apo, MD as Consulting Physician (Ophthalmology)  Indicate any recent Medical Services you may have received from other than [facility] providers in the past year (date may be approximate).     Assessment:   This is a routine wellness examination for [facility].  Hearing/Vision screen No exam data present  Dietary issues and exercise activities discussed:    Goals    . Patient Stated     Wants covid to improve so she can travel to visit her children in [place] and [place]      Depression Screen PHQ 2/9 Scores 08/16/2020 10/14/2019 10/11/2018 08/10/2017 08/07/2016 07/22/2015 06/16/2014  PHQ -  2 Score 0 1 0 0 0 0 0  PHQ- 9 Score 1 - 2 - - - -    Fall Risk Fall Risk  10/14/2019 04/03/2019 10/11/2018 08/10/2017 08/07/2016  Falls in the past year? 0 (No Data) 0 No No  Comment - Emmi Telephone Survey: data to providers prior to load - - -  Number falls in past yr: - (No Data) - - -  Comment - Emmi Telephone Survey Actual Response =  - - -  Risk for fall due to : Medication side effect - - - -  Follow up Falls evaluation completed;Education provided;Falls prevention discussed - - - -    FALL RISK PREVENTION PERTAINING TO THE HOME:  Any stairs in or around the home? {YES/NO:21197} If so, are there any without handrails? No  Home free of loose throw rugs in walkways, pet beds, electrical cords, etc? Yes  Adequate lighting in your home to reduce risk of falls? Yes   ASSISTIVE DEVICES UTILIZED TO PREVENT FALLS:  Life alert? {YES/NO:21197} Use of a cane, walker or w/c? {YES/NO:21197} Grab bars in the bathroom? {YES/NO:21197} Shower chair or bench in shower? {YES/NO:21197} Elevated toilet seat or a handicapped toilet? {YES/NO:21197}   Cognitive Function:     6CIT Screen 10/14/2019  What Year? 0 points  What month? 0 points  What time? 0 points  Count back from 20 0 points  Months in  reverse 0 points  Repeat phrase 0 points  Total Score 0    Immunizations Immunization History  Administered Date(s) Administered  . Fluad Quad(high Dose 65+) 06/06/2019  . Influenza Split 06/28/2011, 07/10/2012  . Influenza, High Dose Seasonal PF 07/22/2015, 06/30/2016, 06/19/2017, 06/25/2018, 06/07/2020, 06/07/2020  . Influenza,inj,Quad PF,6+ Mos 07/16/2013, 06/16/2014  . PFIZER(Purple Top)SARS-COV-2 Vaccination 10/12/2019, 11/06/2019, 06/07/2020  . Pneumococcal Conjugate-13 07/20/2014  . Pneumococcal Polysaccharide-23 07/22/2015  . Td 04/01/2010  . Zoster 08/14/2011    TDAP status: Due, Education has been provided regarding the importance of this vaccine. Advised may receive this vaccine at local pharmacy or Health Dept. Aware to provide a copy of the vaccination record if obtained from local pharmacy or Health Dept. Verbalized acceptance and understanding.  Flu Vaccine status: Up to date  Pneumococcal vaccine status: Up to date  Covid-19 vaccine status: Completed vaccines  Qualifies for Shingles Vaccine? Yes   Zostavax completed Yes   Shingrix Completed?: No.    Education has been provided regarding the importance of this vaccine. Patient has been advised to call insurance company to determine out of pocket expense if they have not yet received this vaccine. Advised may also receive vaccine at local pharmacy or Health Dept. Verbalized acceptance and understanding.  Screening Tests Health Maintenance  Topic Date Due  . COLONOSCOPY (Pts 45-66yrs Insurance coverage will need to be confirmed)  11/03/2019  . TETANUS/TDAP  04/01/2020  . COVID-19 Vaccine (4 - Booster for Pfizer series) 12/06/2020  . MAMMOGRAM  08/09/2022  . INFLUENZA VACCINE  Completed  . DEXA SCAN  Completed  . Hepatitis C Screening  Completed  . PNA vac Low Risk Adult  Completed  . HPV VACCINES  Aged Out    Health Maintenance  Health Maintenance Due  Topic Date Due  . COLONOSCOPY (Pts 45-3yrs Insurance  coverage will need to be confirmed)  11/03/2019  . TETANUS/TDAP  04/01/2020    Colorectal cancer screening: Referral to GI placed 11/02/2020. Pt aware the office will call re: appt.  Mammogram status: Completed 08/09/2020. Repeat every year  Bone  Density status: Completed 09/29/2020. Results reflect: Bone density results: OSTEOPENIA. Repeat every 5 years.  Lung Cancer Screening: (Low Dose CT Chest recommended if Age 21-80 years, 30 pack-year currently smoking OR have quit w/in 15years.) does not qualify.   Lung Cancer Screening Referral: N/A   Additional Screening:  Hepatitis C Screening: does qualify; Completed 08/07/2016  Vision Screening: Recommended annual ophthalmology exams for early detection of glaucoma and other disorders of the eye. Is the patient up to date with their annual eye exam?  {YES/NO:21197} Who is the provider or what is the name of the office in which the patient attends annual eye exams? *** If pt is not established with a provider, would they like to be referred to a provider to establish care? {YES/NO:21197}.   Dental Screening: Recommended annual dental exams for proper oral hygiene  Community Resource Referral / Chronic Care Management: CRR required this visit?  No   CCM required this visit?  No      Plan:     I have personally reviewed and noted the following in the patient's chart:   . Medical and social history . Use of alcohol, tobacco or illicit drugs  . Current medications and supplements . Functional ability and status . Nutritional status . Physical activity . Advanced directives . List of other physicians . Hospitalizations, surgeries, and ER visits in previous 12 months . Vitals . Screenings to include cognitive, depression, and falls . Referrals and appointments  In addition, I have reviewed and discussed with patient certain preventive protocols, quality metrics, and best practice recommendations. A written personalized care  plan for preventive services as well as general preventive health recommendations were provided to patient.     Ofilia Neas, LPN   0/09/4101   Nurse Notes: None

## 2020-11-18 ENCOUNTER — Other Ambulatory Visit: Payer: Self-pay

## 2020-11-18 ENCOUNTER — Other Ambulatory Visit: Payer: Self-pay | Admitting: Family Medicine

## 2020-11-18 MED ORDER — MIRABEGRON ER 50 MG PO TB24
ORAL_TABLET | ORAL | 1 refills | Status: DC
Start: 2020-11-18 — End: 2021-08-17

## 2020-11-22 NOTE — Progress Notes (Signed)
Subjective:   Linda Blackburn is a 70 y.o. female who presents for Medicare Annual (Subsequent) preventive examination.  Virtual Visit via Video Note  I connected with Linda Blackburn  by a video enabled telemedicine application and verified that I am speaking with the correct person using two identifiers.  Location: Patient: Home Provider: Office Persons participating in the virtual visit: patient, provider   I discussed the limitations of evaluation and management by telemedicine and the availability of in person appointments. The patient expressed understanding and agreed to proceed.     Linda Beach Akari Defelice,LPN   Review of Systems    N/A  Cardiac Risk Factors include: advanced age (>8men, >22 women);hypertension     Objective:    Today's Vitals   There is no height or weight on file to calculate BMI.  Advanced Directives 11/23/2020 03/03/2020 01/21/2020 01/21/2020 10/14/2019 05/27/2019 05/26/2019  Does Patient Have a Medical Advance Directive? Yes No Yes Yes Yes Yes Yes  Type of Paramedic of [place];Living will - [place];Living will - [place];Living will [place];Living will [place];Living will  Does patient want to make changes to medical advance directive? No - Patient declined - No - Patient declined - No - Patient declined No - Patient declined No - Patient declined  Copy of [place] in Chart? No - copy requested - No - copy requested - No - copy requested No - copy requested No - copy requested  Would patient like information on creating a medical advance directive? - No - Patient declined - - - - -    Current Medications (verified) Outpatient Encounter Medications as of 11/23/2020  Medication Sig  . fluconazole (DIFLUCAN) 100 MG tablet Take 1 tablet (100 mg total) by mouth daily as needed.  Marland Kitchen losartan (COZAAR) 100 MG tablet Take 1 tablet (100 mg  total) by mouth daily.  . mirabegron ER (MYRBETRIQ) 50 MG TB24 tablet TAKE ONE TABLET BY MOUTH EVERY DAY - (SWALLOW TABLET WHOLE, DO NOT DIVIDE, CRUSH, OR CHEW)  . ondansetron (ZOFRAN-ODT) 4 MG disintegrating tablet Take 1 tablet (4 mg total) by mouth every 8 (eight) hours as needed.  . pantoprazole (PROTONIX) 40 MG tablet Take 1 tablet (40 mg total) by mouth daily at 6 (six) AM.  . sertraline (ZOLOFT) 100 MG tablet Take one and one half tablet by mouth once daily  . zolpidem (AMBIEN) 5 MG tablet Take 1 tablet (5 mg total) by mouth at bedtime as needed for sleep.   No facility-administered encounter medications on file as of 11/23/2020.    Allergies (verified) Other, Buprenorphine hcl, Codeine, Dilaudid [hydromorphone hcl], and Morphine and related   History: Past Medical History:  Diagnosis Date  . Abnormal uterine bleeding   . Amenorrhea   . Anxiety   . Depression   . Gallstones   . Hay fever   . HTN (hypertension)   . Pancreatitis   . Urine incontinence   . UTI (urinary tract infection)    reoccuring    Past Surgical History:  Procedure Laterality Date  . ABDOMINAL HYSTERECTOMY  1984   fibroids  . APPENDECTOMY  1984  . BILIARY DILATION  05/29/2019   Procedure: BILIARY DILATION;  Surgeon: Rush Landmark Telford Nab., MD;  Location: [facility];  Service: Gastroenterology;;  . BIOPSY  05/29/2019   Procedure: BIOPSY;  Surgeon: Irving Copas., MD;  Location: [facility];  Service: Gastroenterology;;  . [name]  09/08/1999  . ERCP N/A 05/29/2019   Procedure: ENDOSCOPIC RETROGRADE CHOLANGIOPANCREATOGRAPHY (ERCP);  Surgeon: Irving Copas., MD;  Location: [facility];  Service: Gastroenterology;  Laterality: N/A;  . ERCP W/ SPHINCTEROTOMY AND BALLOON DILATION  08/2008  . ESOPHAGOGASTRODUODENOSCOPY (EGD) WITH PROPOFOL N/A 05/29/2019   Procedure: ESOPHAGOGASTRODUODENOSCOPY (EGD) WITH PROPOFOL;  Surgeon: Rush Landmark Telford Nab., MD;  Location: [facility];   Service: Gastroenterology;  Laterality: N/A;  . OOPHORECTOMY     Only 1 was taken  . POLYPECTOMY  05/29/2019   Procedure: POLYPECTOMY;  Surgeon: Mansouraty, Telford Nab., MD;  Location: [facility];  Service: Gastroenterology;;  . REMOVAL OF STONES  05/29/2019   Procedure: REMOVAL OF STONES;  Surgeon: Irving Copas., MD;  Location: [facility];  Service: Gastroenterology;;  . TONSILLECTOMY    . TUBAL LIGATION     Family History  Problem Relation Age of Onset  . Alcohol abuse Mother   . Prostate cancer Father   . Stroke Maternal Aunt   . Hiatal hernia Maternal Aunt   . Prostate cancer Paternal Grandfather   . Cerebral palsy Granddaughter   . Breast cancer Neg Hx    Social History   Socioeconomic History  . Marital status: Widowed    Spouse name: Not on file  . Number of children: 2  . Years of education: Not on file  . Highest education level: Not on file  Occupational History  . Occupation: retired  Tobacco Use  . Smoking status: Never Smoker  . Smokeless tobacco: Never Used  Vaping Use  . Vaping Use: Never used  Substance and Sexual Activity  . Alcohol use: Yes    Alcohol/week: 0.0 standard drinks    Comment: occ - hardly every   . Drug use: No  . Sexual activity: Not Currently    Partners: Male    Birth control/protection: Surgical  Other Topics Concern  . Not on file  Social History Narrative   10/14/2019: Lives alone in apartment.    Has two sons, one in [place] and one in [place], 5 grandchildren    Has considered moving closer to sons but likes [place] too much.   Neighbors good social support   Enjoys watching soap operas, walking when she feels well, but recently struggling with more frequent episodes of pancreatitis requiring hospitalization.   Keeps positive attitude, "one day at a time"      Social Determinants of Health   Financial Resource Strain: Low Risk   . Difficulty of Paying Living Expenses: Not hard at all  Food Insecurity: No Food Insecurity  .  Worried About Charity fundraiser in the Last Year: Never true  . Ran Out of Food in the Last Year: Never true  Transportation Needs: No Transportation Needs  . Lack of Transportation (Medical): No  . Lack of Transportation (Non-Medical): No  Physical Activity: Inactive  . Days of Exercise per Week: 0 days  . Minutes of Exercise per Session: 0 min  Stress: No Stress Concern Present  . Feeling of Stress : Not at all  Social Connections: Moderately Isolated  . Frequency of Communication with Friends and Family: More than three times a week  . Frequency of Social Gatherings with Friends and Family: Never  . Attends Religious Services: Never  . Active Member of Clubs or Organizations: Yes  . Attends Archivist Meetings: Never  . Marital Status: Widowed    Tobacco Counseling Counseling given: Not Answered   Clinical Intake:  Pre-visit preparation completed: Yes  Pain : No/denies pain     Nutritional Risks: Nausea/ vomitting/ diarrhea (Nausea and Diarrhea) Diabetes: No  How often do you need to have someone help you when you read instructions, pamphlets, or other written materials from your doctor or pharmacy?: 1 - Never  Diabetic?No  Interpreter Needed?: No  Information entered by :: [place] of Daily Living In your present state of health, do you have any difficulty performing the following activities: 11/23/2020 01/21/2020  Hearing? N N  Vision? N N  Difficulty concentrating or making decisions? - N  Walking or climbing stairs? Y Y  Comment - gets short of breath  Dressing or bathing? N N  Doing errands, shopping? N N  Preparing Food and eating ? N -  Using the Toilet? N -  In the past six months, have you accidently leaked urine? Y -  Comment has occassional bladder leakage. Wears incontience pad -  Do you have problems with loss of bowel control? N -  Managing your Medications? N -  Managing your Finances? N -  Housekeeping or managing  your Housekeeping? N -  Some recent data might be hidden    Patient Care Team: [name], MD as PCP - General Milus Banister, MD as Attending Physician (Gastroenterology) Katy Apo, MD as Consulting Physician (Ophthalmology)  Indicate any recent Medical Services you may have received from other than [facility] providers in the past year (date may be approximate).     Assessment:   This is a routine wellness examination for [facility].  Hearing/Vision screen  Hearing Screening   125Hz  250Hz  500Hz  1000Hz  2000Hz  3000Hz  4000Hz  6000Hz  8000Hz   Right ear:           Left ear:           Vision Screening Comments: Gets routine eye exams yearly, wears glasses and has cataracts   Dietary issues and exercise activities discussed: Current Exercise Habits: The patient does not participate in regular exercise at present  Goals    . Exercise 3x per week (30 min per time)    . Patient Stated     Wants covid to improve so she can travel to visit her children in [place] and [place]    . Patient Stated     I would like to stay healthy enough that I can stay out of hospital.       Depression Screen [facility] 2/9 Scores 11/23/2020 08/16/2020 10/14/2019 10/11/2018 08/10/2017 08/07/2016 07/22/2015  PHQ - 2 Score 0 0 1 0 0 0 0  PHQ- 9 Score - 1 - 2 - - -    Fall Risk Fall Risk  11/23/2020 10/14/2019 04/03/2019 10/11/2018 08/10/2017  Falls in the past year? 0 0 (No Data) 0 No  Comment - - Emmi Telephone Survey: data to providers prior to load - -  Number falls in past yr: 0 - (No Data) - -  Comment - - Emmi Telephone Survey Actual Response =  - -  Injury with Fall? 0 - - - -  Risk for fall due to : Orthopedic patient Medication side effect - - -  Risk for fall due to: Comment has knee weakness - - - -  Follow up Falls evaluation completed;Falls prevention discussed Falls evaluation completed;Education provided;Falls prevention discussed - - -    FALL RISK PREVENTION PERTAINING TO THE HOME:  Any stairs in or around  the home? No  If so, are there any without handrails? No  Home free  of loose throw rugs in walkways, pet beds, electrical cords, etc? Yes  Adequate lighting in your home to reduce risk of falls? Yes   ASSISTIVE DEVICES UTILIZED TO PREVENT FALLS:  Life alert? No  Use of a cane, walker or w/c? No  Grab bars in the bathroom? No  Shower chair or bench in shower? No  Elevated toilet seat or a handicapped toilet? No     Cognitive Function:   Normal cognitive status assessed by direct observation by this Nurse Health Advisor. No abnormalities found.     6CIT Screen 10/14/2019  What Year? 0 points  What month? 0 points  What time? 0 points  Count back from 20 0 points  Months in reverse 0 points  Repeat phrase 0 points  Total Score 0    Immunizations Immunization History  Administered Date(s) Administered  . Fluad Quad(high Dose 65+) 06/06/2019  . Influenza Split 06/28/2011, 07/10/2012  . Influenza, High Dose Seasonal PF 07/22/2015, 06/30/2016, 06/19/2017, 06/25/2018, 06/07/2020, 06/07/2020  . Influenza,inj,Quad PF,6+ Mos 07/16/2013, 06/16/2014  . PFIZER(Purple Top)SARS-COV-2 Vaccination 10/12/2019, 11/06/2019, 06/07/2020  . Pneumococcal Conjugate-13 07/20/2014  . Pneumococcal Polysaccharide-23 07/22/2015  . Td 04/01/2010  . Zoster 08/14/2011    TDAP status: Due, Education has been provided regarding the importance of this vaccine. Advised may receive this vaccine at local pharmacy or Health Dept. Aware to provide a copy of the vaccination record if obtained from local pharmacy or Health Dept. Verbalized acceptance and understanding.  Flu Vaccine status: Up to date  Pneumococcal vaccine status: Up to date  Covid-19 vaccine status: Completed vaccines  Qualifies for Shingles Vaccine? Yes   Zostavax completed Yes   Shingrix Completed?: No.    Education has been provided regarding the importance of this vaccine. Patient has been advised to call insurance company to determine  out of pocket expense if they have not yet received this vaccine. Advised may also receive vaccine at local pharmacy or Health Dept. Verbalized acceptance and understanding.  Screening Tests Health Maintenance  Topic Date Due  . COLONOSCOPY (Pts 45-37yrs Insurance coverage will need to be confirmed)  11/03/2019  . TETANUS/TDAP  04/01/2020  . COVID-19 Vaccine (4 - Booster for Pfizer series) 12/06/2020  . MAMMOGRAM  08/09/2021  . INFLUENZA VACCINE  Completed  . DEXA SCAN  Completed  . Hepatitis C Screening  Completed  . PNA vac Low Risk Adult  Completed  . HPV VACCINES  Aged Out    Health Maintenance  Health Maintenance Due  Topic Date Due  . COLONOSCOPY (Pts 45-25yrs Insurance coverage will need to be confirmed)  11/03/2019  . TETANUS/TDAP  04/01/2020    Colorectal Cancer Screening: Patient Declined   Mammogram status: Completed 08/09/2020. Repeat every year  Bone Density status: Completed 09/29/2020. Results reflect: Bone density results: OSTEOPENIA. Repeat every 5 years.  Lung Cancer Screening: (Low Dose CT Chest recommended if Age 67-80 years, 30 pack-year currently smoking OR have quit w/in 15years.) does not qualify.   Lung Cancer Screening Referral: N/A   Additional Screening:  Hepatitis C Screening: does qualify; Completed 08/07/2016  Vision Screening: Recommended annual ophthalmology exams for early detection of glaucoma and other disorders of the eye. Is the patient up to date with their annual eye exam?  Yes  Who is the provider or what is the name of the office in which the patient attends annual eye exams? Dr. Prudencio Burly  If pt is not established with a provider, would they like to be referred to a provider  to establish care? No .   Dental Screening: Recommended annual dental exams for proper oral hygiene  Community Resource Referral / Chronic Care Management: CRR required this visit?  No   CCM required this visit?  No      Plan:     I have personally  reviewed and noted the following in the patient's chart:   . Medical and social history . Use of alcohol, tobacco or illicit drugs  . Current medications and supplements . Functional ability and status . Nutritional status . Physical activity . Advanced directives . List of other physicians . Hospitalizations, surgeries, and ER visits in previous 12 months . Vitals . Screenings to include cognitive, depression, and falls . Referrals and appointments  In addition, I have reviewed and discussed with patient certain preventive protocols, quality metrics, and best practice recommendations. A written personalized care plan for preventive services as well as general preventive health recommendations were provided to patient.     Ofilia Neas, LPN   [id number]/72/[id number]   Nurse Notes: None

## 2020-11-23 ENCOUNTER — Ambulatory Visit (INDEPENDENT_AMBULATORY_CARE_PROVIDER_SITE_OTHER): Payer: Medicare Other

## 2020-11-23 DIAGNOSIS — Z0000 Encounter for general adult medical examination without abnormal findings: Secondary | ICD-10-CM | POA: Diagnosis not present

## 2020-11-23 NOTE — Patient Instructions (Signed)
Linda Blackburn , Thank you for taking time to come for your Medicare Wellness Visit. I appreciate your ongoing commitment to your health goals. Please review the following plan we discussed and let me know if I can assist you in the future.   Screening recommendations/referrals: Colonoscopy: Currently due, please let us know when you are ready to get scheduled. Mammogram: Up to date, next due 08/09/2021 Bone Density: Up to date, next due 09/29/2025 Recommended yearly ophthalmology/optometry visit for glaucoma screening and checkup Recommended yearly dental visit for hygiene and checkup  Vaccinations: Influenza vaccine: Up to date, next due fall 2022  Pneumococcal vaccine: Completed Series  Tdap vaccine: Currently due, you may await and injury Shingles vaccine: Currently due for Shingrix, if you would like to receive we recommend that you receive at your local pharmacy.   Advanced directives: Please bring in a copy of your advanced medical directives so that may scan into your chart.   Conditions/risks identified: None   Next appointment: None    Preventive Care 65 Years and Older, Female Preventive care refers to lifestyle choices and visits with your health care provider that can promote health and wellness. What does preventive care include?  A yearly physical exam. This is also called an annual well check.  Dental exams once or twice a year.  Routine eye exams. Ask your health care provider how often you should have your eyes checked.  Personal lifestyle choices, including:  Daily care of your teeth and gums.  Regular physical activity.  Eating a healthy diet.  Avoiding tobacco and drug use.  Limiting alcohol use.  Practicing safe sex.  Taking low-dose aspirin every day.  Taking vitamin and mineral supplements as recommended by your health care provider. What happens during an annual well check? The services and screenings done by your health care provider during  your annual well check will depend on your age, overall health, lifestyle risk factors, and family history of disease. Counseling  Your health care provider may ask you questions about your:  Alcohol use.  Tobacco use.  Drug use.  Emotional well-being.  Home and relationship well-being.  Sexual activity.  Eating habits.  History of falls.  Memory and ability to understand (cognition).  Work and work Statistician.  Reproductive health. Screening  You may have the following tests or measurements:  Height, weight, and BMI.  Blood pressure.  Lipid and cholesterol levels. These may be checked every 5 years, or more frequently if you are over 21 years old.  Skin check.  Lung cancer screening. You may have this screening every year starting at age 46 if you have a 30-pack-year history of smoking and currently smoke or have quit within the past 15 years.  Fecal occult blood test (FOBT) of the stool. You may have this test every year starting at age 46.  Flexible sigmoidoscopy or colonoscopy. You may have a sigmoidoscopy every 5 years or a colonoscopy every 10 years starting at age 67.  Hepatitis C blood test.  Hepatitis B blood test.  Sexually transmitted disease (STD) testing.  Diabetes screening. This is done by checking your blood sugar (glucose) after you have not eaten for a while (fasting). You may have this done every 1-3 years.  Bone density scan. This is done to screen for osteoporosis. You may have this done starting at age 19.  Mammogram. This may be done every 1-2 years. Talk to your health care provider about how often you should have regular mammograms. Talk with  your health care provider about your test results, treatment options, and if necessary, the need for more tests. Vaccines  Your health care provider may recommend certain vaccines, such as:  Influenza vaccine. This is recommended every year.  Tetanus, diphtheria, and acellular pertussis (Tdap,  Td) vaccine. You may need a Td booster every 10 years.  Zoster vaccine. You may need this after age 62.  Pneumococcal 13-valent conjugate (PCV13) vaccine. One dose is recommended after age 48.  Pneumococcal polysaccharide (PPSV23) vaccine. One dose is recommended after age 83. Talk to your health care provider about which screenings and vaccines you need and how often you need them. This information is not intended to replace advice given to you by your health care provider. Make sure you discuss any questions you have with your health care provider. Document Released: 09/17/2015 Document Revised: 05/10/2016 Document Reviewed: 06/22/2015 Elsevier Interactive Patient Education  2017 [place] Prevention in the Home Falls can cause injuries. They can happen to people of all ages. There are many things you can do to make your home safe and to help prevent falls. What can I do on the outside of my home?  Regularly fix the edges of walkways and driveways and fix any cracks.  Remove anything that might make you trip as you walk through a door, such as a raised step or threshold.  Trim any bushes or trees on the path to your home.  Use bright outdoor lighting.  Clear any walking paths of anything that might make someone trip, such as rocks or tools.  Regularly check to see if handrails are loose or broken. Make sure that both sides of any steps have handrails.  Any raised decks and porches should have guardrails on the edges.  Have any leaves, snow, or ice cleared regularly.  Use sand or salt on walking paths during winter.  Clean up any spills in your garage right away. This includes oil or grease spills. What can I do in the bathroom?  Use night lights.  Install grab bars by the toilet and in the tub and shower. Do not use towel bars as grab bars.  Use non-skid mats or decals in the tub or shower.  If you need to sit down in the shower, use a plastic, non-slip  stool.  Keep the floor dry. Clean up any water that spills on the floor as soon as it happens.  Remove soap buildup in the tub or shower regularly.  Attach bath mats securely with double-sided non-slip rug tape.  Do not have throw rugs and other things on the floor that can make you trip. What can I do in the bedroom?  Use night lights.  Make sure that you have a light by your bed that is easy to reach.  Do not use any sheets or blankets that are too big for your bed. They should not hang down onto the floor.  Have a firm chair that has side arms. You can use this for support while you get dressed.  Do not have throw rugs and other things on the floor that can make you trip. What can I do in the kitchen?  Clean up any spills right away.  Avoid walking on wet floors.  Keep items that you use a lot in easy-to-reach places.  If you need to reach something above you, use a strong step stool that has a grab bar.  Keep electrical cords out of the way.  Do not  use floor polish or wax that makes floors slippery. If you must use wax, use non-skid floor wax.  Do not have throw rugs and other things on the floor that can make you trip. What can I do with my stairs?  Do not leave any items on the stairs.  Make sure that there are handrails on both sides of the stairs and use them. Fix handrails that are broken or loose. Make sure that handrails are as long as the stairways.  Check any carpeting to make sure that it is firmly attached to the stairs. Fix any carpet that is loose or worn.  Avoid having throw rugs at the top or bottom of the stairs. If you do have throw rugs, attach them to the floor with carpet tape.  Make sure that you have a light switch at the top of the stairs and the bottom of the stairs. If you do not have them, ask someone to add them for you. What else can I do to help prevent falls?  Wear shoes that:  Do not have high heels.  Have rubber bottoms.  Are  comfortable and fit you well.  Are closed at the toe. Do not wear sandals.  If you use a stepladder:  Make sure that it is fully opened. Do not climb a closed stepladder.  Make sure that both sides of the stepladder are locked into place.  Ask someone to hold it for you, if possible.  Clearly mark and make sure that you can see:  Any grab bars or handrails.  First and last steps.  Where the edge of each step is.  Use tools that help you move around (mobility aids) if they are needed. These include:  Canes.  Walkers.  Scooters.  Crutches.  Turn on the lights when you go into a dark area. Replace any light bulbs as soon as they burn out.  Set up your furniture so you have a clear path. Avoid moving your furniture around.  If any of your floors are uneven, fix them.  If there are any pets around you, be aware of where they are.  Review your medicines with your doctor. Some medicines can make you feel dizzy. This can increase your chance of falling. Ask your doctor what other things that you can do to help prevent falls. This information is not intended to replace advice given to you by your health care provider. Make sure you discuss any questions you have with your health care provider. Document Released: 06/17/2009 Document Revised: 01/27/2016 Document Reviewed: 09/25/2014 Elsevier Interactive Patient Education  2017 [employer].

## 2021-03-03 DIAGNOSIS — R350 Frequency of micturition: Secondary | ICD-10-CM | POA: Diagnosis not present

## 2021-03-03 DIAGNOSIS — N3946 Mixed incontinence: Secondary | ICD-10-CM | POA: Diagnosis not present

## 2021-04-07 DIAGNOSIS — R350 Frequency of micturition: Secondary | ICD-10-CM | POA: Diagnosis not present

## 2021-04-07 DIAGNOSIS — N3946 Mixed incontinence: Secondary | ICD-10-CM | POA: Diagnosis not present

## 2021-04-11 ENCOUNTER — Other Ambulatory Visit: Payer: Self-pay | Admitting: Family Medicine

## 2021-04-11 NOTE — Telephone Encounter (Signed)
Please advise, last filled in 08/2020. Should the patient continue this medication?

## 2021-04-27 DIAGNOSIS — H2513 Age-related nuclear cataract, bilateral: Secondary | ICD-10-CM | POA: Diagnosis not present

## 2021-05-12 ENCOUNTER — Other Ambulatory Visit: Payer: Self-pay | Admitting: Family Medicine

## 2021-05-23 ENCOUNTER — Other Ambulatory Visit: Payer: Self-pay | Admitting: Family Medicine

## 2021-05-23 DIAGNOSIS — Z1231 Encounter for screening mammogram for malignant neoplasm of breast: Secondary | ICD-10-CM

## 2021-06-16 ENCOUNTER — Other Ambulatory Visit: Payer: Self-pay

## 2021-06-16 ENCOUNTER — Ambulatory Visit (INDEPENDENT_AMBULATORY_CARE_PROVIDER_SITE_OTHER): Payer: Medicare Other

## 2021-06-16 DIAGNOSIS — Z23 Encounter for immunization: Secondary | ICD-10-CM

## 2021-07-01 ENCOUNTER — Other Ambulatory Visit: Payer: Self-pay | Admitting: Family Medicine

## 2021-08-10 DIAGNOSIS — R350 Frequency of micturition: Secondary | ICD-10-CM | POA: Diagnosis not present

## 2021-08-11 ENCOUNTER — Ambulatory Visit
Admission: RE | Admit: 2021-08-11 | Discharge: 2021-08-11 | Disposition: A | Discharge status: Home / Self Care | Payer: Medicare Other | Source: Ambulatory Visit | Attending: Family Medicine | Admitting: Family Medicine

## 2021-08-11 DIAGNOSIS — Z1231 Encounter for screening mammogram for malignant neoplasm of breast: Secondary | ICD-10-CM

## 2021-08-17 ENCOUNTER — Ambulatory Visit (INDEPENDENT_AMBULATORY_CARE_PROVIDER_SITE_OTHER): Payer: Medicare Other | Admitting: Family Medicine

## 2021-08-17 VITALS — BP 140/80 | HR 95 | Temp 98.3°F | Ht 61.0 in | Wt 231.2 lb

## 2021-08-17 DIAGNOSIS — K219 Gastro-esophageal reflux disease without esophagitis: Secondary | ICD-10-CM | POA: Diagnosis not present

## 2021-08-17 DIAGNOSIS — L989 Disorder of the skin and subcutaneous tissue, unspecified: Secondary | ICD-10-CM | POA: Diagnosis not present

## 2021-08-17 DIAGNOSIS — F339 Major depressive disorder, recurrent, unspecified: Secondary | ICD-10-CM

## 2021-08-17 DIAGNOSIS — I10 Essential (primary) hypertension: Secondary | ICD-10-CM | POA: Diagnosis not present

## 2021-08-17 DIAGNOSIS — Z79899 Other long term (current) drug therapy: Secondary | ICD-10-CM

## 2021-08-17 DIAGNOSIS — R5383 Other fatigue: Secondary | ICD-10-CM | POA: Diagnosis not present

## 2021-08-17 LAB — CBC WITH DIFFERENTIAL/PLATELET
Basophils Absolute: 0.1 10*3/uL (ref 0.0–0.1)
Basophils Relative: 1.2 % (ref 0.0–3.0)
Eosinophils Absolute: 0.1 10*3/uL (ref 0.0–0.7)
Eosinophils Relative: 2.5 % (ref 0.0–5.0)
HCT: 44.5 % (ref 36.0–46.0)
Hemoglobin: 14.7 g/dL (ref 12.0–15.0)
Lymphocytes Relative: 40.2 % (ref 12.0–46.0)
Lymphs Abs: 2.1 10*3/uL (ref 0.7–4.0)
MCHC: 33.1 g/dL (ref 30.0–36.0)
MCV: 87.8 fl (ref 78.0–100.0)
Monocytes Absolute: 0.4 10*3/uL (ref 0.1–1.0)
Monocytes Relative: 7.2 % (ref 3.0–12.0)
Neutro Abs: 2.6 10*3/uL (ref 1.4–7.7)
Neutrophils Relative %: 48.9 % (ref 43.0–77.0)
Platelets: 229 10*3/uL (ref 150.0–400.0)
RBC: 5.07 Mil/uL (ref 3.87–5.11)
RDW: 13.9 % (ref 11.5–15.5)
WBC: 5.2 10*3/uL (ref 4.0–10.5)

## 2021-08-17 LAB — COMPREHENSIVE METABOLIC PANEL
ALT: 27 U/L (ref 0–35)
AST: 24 U/L (ref 0–37)
Albumin: 4.4 g/dL (ref 3.5–5.2)
Alkaline Phosphatase: 79 U/L (ref 39–117)
BUN: 19 mg/dL (ref 6–23)
CO2: 27 mEq/L (ref 19–32)
Calcium: 9.6 mg/dL (ref 8.4–10.5)
Chloride: 105 mEq/L (ref 96–112)
Creatinine, Ser: 0.83 mg/dL (ref 0.40–1.20)
GFR: 70.33 mL/min (ref >60.00)
Glucose, Bld: 92 mg/dL (ref 70–99)
Potassium: 4 mEq/L (ref 3.5–5.1)
Sodium: 138 mEq/L (ref 135–145)
Total Bilirubin: 0.9 mg/dL (ref 0.2–1.2)
Total Protein: 7.6 g/dL (ref 6.0–8.3)

## 2021-08-17 LAB — VITAMIN B12: Vitamin B-12: 235 pg/mL (ref 211–911)

## 2021-08-17 LAB — TSH: TSH: 1.69 u[IU]/mL (ref 0.35–5.50)

## 2021-08-17 MED ORDER — PANTOPRAZOLE SODIUM 40 MG PO TBEC
40.0000 mg | DELAYED_RELEASE_TABLET | Freq: Every day | ORAL | 3 refills | Status: DC
Start: 2021-08-17 — End: 2022-08-22

## 2021-08-17 MED ORDER — ONDANSETRON 4 MG PO TBDP
ORAL_TABLET | ORAL | 1 refills | Status: DC
Start: 2021-08-17 — End: 2022-08-22

## 2021-08-17 MED ORDER — ZOLPIDEM TARTRATE 5 MG PO TABS: 5.0000 mg | ORAL_TABLET | Freq: Every evening | ORAL | 0 refills | Status: DC | PRN

## 2021-08-17 MED ORDER — LOSARTAN POTASSIUM 100 MG PO TABS
100.0000 mg | ORAL_TABLET | Freq: Every day | ORAL | 3 refills | Status: DC
Start: 2021-08-17 — End: 2022-08-22

## 2021-08-17 MED ORDER — FLUCONAZOLE 100 MG PO TABS
100.0000 mg | ORAL_TABLET | Freq: Every day | ORAL | 1 refills | Status: DC | PRN
Start: 2021-08-17 — End: 2022-08-22

## 2021-08-17 MED ORDER — SERTRALINE HCL 100 MG PO TABS
ORAL_TABLET | ORAL | 3 refills | Status: DC
Start: 2021-08-17 — End: 2022-08-22

## 2021-08-17 NOTE — Progress Notes (Signed)
Established Patient Office Visit  Subjective:  Patient ID: Linda Blackburn, female    DOB: 22-Nov-1950  Age: 70 y.o. MRN: [id number]  CC:  Chief Complaint  Patient presents with   Annual Exam    HPI Clint Strupp was initially scheduled for a "complete physical ".  However, she has Medicare primary and already had Medicare wellness visit earlier this year.  She has multiple medical problems that need to be addressed as follows.  History of recurrent pancreatitis.  No alcohol use.  Fortunately, she has not had any recent flareups since last spring.  She is very excited about that.  She has longstanding history of GERD and takes Protonix.  Needing refills.  She has hypertension treated with losartan.  Does not monitor blood pressures.  Has gained some weight this past year.  She has history of recurrent depression stable on sertraline 150 mg daily.  She has transient insomnia and takes Ambien infrequently.  Requesting 1 refill.  She knows to use this sparingly.  Major complaint is fatigue.  She also has a lot of achiness especially in her lower extremities especially with first getting up in the morning.  She knows her weight gain may have exacerbated things.  She has noticed some shortness of breath with things like walking but has gained weight and very sedentary.  No chest pain  She has an area right cheek which has been irritated for several months and recently has become more sore.  Has been slightly scaly in the past.  Past Medical History:  Diagnosis Date   Abnormal uterine bleeding    Amenorrhea    Anxiety    Depression    Gallstones    Hay fever    HTN (hypertension)    Pancreatitis    Urine incontinence    UTI (urinary tract infection)    reoccuring     Past Surgical History:  Procedure Laterality Date   ABDOMINAL HYSTERECTOMY  1984   fibroids   APPENDECTOMY  1984   BILIARY DILATION  05/29/2019   Procedure: BILIARY DILATION;  Surgeon: Irving Copas., MD;  Location: [facility];  Service: Gastroenterology;;   BIOPSY  05/29/2019   Procedure: BIOPSY;  Surgeon: Irving Copas., MD;  Location: [facility];  Service: Gastroenterology;;   CHOLECYSTECTOMY  09/08/1999   ERCP N/A 05/29/2019   Procedure: ENDOSCOPIC RETROGRADE CHOLANGIOPANCREATOGRAPHY (ERCP);  Surgeon: Irving Copas., MD;  Location: [facility];  Service: Gastroenterology;  Laterality: N/A;   ERCP W/ SPHINCTEROTOMY AND BALLOON DILATION  08/2008   ESOPHAGOGASTRODUODENOSCOPY (EGD) WITH PROPOFOL N/A 05/29/2019   Procedure: ESOPHAGOGASTRODUODENOSCOPY (EGD) WITH PROPOFOL;  Surgeon: Rush Landmark Telford Nab., MD;  Location: [facility];  Service: Gastroenterology;  Laterality: N/A;   OOPHORECTOMY     Only 1 was taken   POLYPECTOMY  05/29/2019   Procedure: POLYPECTOMY;  Surgeon: Mansouraty, Telford Nab., MD;  Location: [facility];  Service: Gastroenterology;;   REMOVAL OF STONES  05/29/2019   Procedure: REMOVAL OF STONES;  Surgeon: Irving Copas., MD;  Location: [facility];  Service: Gastroenterology;;   TONSILLECTOMY     TUBAL LIGATION      Family History  Problem Relation Age of Onset   Alcohol abuse Mother    Prostate cancer Father    Stroke Maternal Aunt    Hiatal hernia Maternal Aunt    Prostate cancer Paternal Grandfather    Cerebral palsy Granddaughter    Breast cancer Neg Hx     Social History  Socioeconomic History   Marital status: Widowed    Spouse name: Not on file   Number of children: 2   Years of education: Not on file   Highest education level: Not on file  Occupational History   Occupation: retired  Tobacco Use   Smoking status: Never   Smokeless tobacco: Never  Vaping Use   Vaping Use: Never used  Substance and Sexual Activity   Alcohol use: Yes    Alcohol/week: 0.0 standard drinks    Comment: occ - hardly every    Drug use: No   Sexual activity: Not Currently    Partners: Male    Birth control/protection: Surgical   Other Topics Concern   Not on file  Social History Narrative   10/14/2019: Lives alone in apartment.    Has two sons, one in [place] and one in [place], 5 grandchildren    Has considered moving closer to sons but likes [place] too much.   Neighbors good social support   Enjoys watching soap operas, walking when she feels well, but recently struggling with more frequent episodes of pancreatitis requiring hospitalization.   Keeps positive attitude, "one day at a time"      Social Determinants of Radio broadcast assistant Strain: Low Risk    Difficulty of Paying Living Expenses: Not hard at all  Food Insecurity: No Food Insecurity   Worried About Charity fundraiser in the Last Year: Never true   C-[address] in the Last Year: Never true  Transportation Needs: No Transportation Needs   Lack of Transportation (Medical): No   Lack of Transportation (Non-Medical): No  Physical Activity: Inactive   Days of Exercise per Week: 0 days   Minutes of Exercise per Session: 0 min  Stress: No Stress Concern Present   Feeling of Stress : Not at all  Social Connections: Moderately Isolated   Frequency of Communication with Friends and Family: More than three times a week   Frequency of Social Gatherings with Friends and Family: Never   Attends Religious Services: Never   Marine scientist or Organizations: Yes   Attends Archivist Meetings: Never   Marital Status: Widowed  Human resources officer Violence: Not At Risk   Fear of Current or Ex-Partner: No   Emotionally Abused: No   Physically Abused: No   Sexually Abused: No    Outpatient Medications Prior to Visit  Medication Sig Dispense Refill   Vibegron 75 MG TABS      fluconazole (DIFLUCAN) 100 MG tablet Take 1 tablet (100 mg total) by mouth daily as needed. 20 tablet 1   losartan (COZAAR) 100 MG tablet TAKE ONE TABLET BY MOUTH EVERY DAY 90 tablet 3   ondansetron (ZOFRAN-ODT) 4 MG disintegrating tablet DISSOLVE ONE TABLET ON TONGUE EVERY  8 HOURS AS NEEDED 30 tablet 4   pantoprazole (PROTONIX) 40 MG tablet TAKE ONE TABLET BY MOUTH EVERY DAY AT 6PM 90 tablet 0   sertraline (ZOLOFT) 100 MG tablet TAKE ONE AND ONE-HALF TABLETS BY MOUTH EVERY DAY 135 tablet 3   zolpidem (AMBIEN) 5 MG tablet Take 1 tablet (5 mg total) by mouth at bedtime as needed for sleep. 30 tablet 0   mirabegron ER (MYRBETRIQ) 50 MG TB24 tablet TAKE ONE TABLET BY MOUTH EVERY DAY - (SWALLOW TABLET WHOLE, DO NOT DIVIDE, CRUSH, OR CHEW) 90 tablet 1   No facility-administered medications prior to visit.    Allergies  Allergen Reactions   Other Hives  and Swelling    AQUASONIC [place] GEL   Buprenorphine Hcl     "In a scarry movie" weird thoughts   Codeine     GI upset   Dilaudid [Hydromorphone Hcl] Itching   Morphine And Related     "In a scarry movie" weird thoughts    ROS Review of Systems  Constitutional:  Positive for fatigue. Negative for chills and fever.  Respiratory:  Positive for shortness of breath. Negative for cough.   Cardiovascular:  Negative for chest pain and leg swelling.  Gastrointestinal:  Negative for abdominal pain.  Genitourinary:  Negative for dysuria.     Objective:    Physical Exam Constitutional:      Appearance: She is well-developed.  Eyes:     Pupils: Pupils are equal, round, and reactive to light.  Neck:     Thyroid: No thyromegaly.     Vascular: No JVD.  Cardiovascular:     Rate and Rhythm: Normal rate and regular rhythm.     Heart sounds:    No gallop.  Pulmonary:     Effort: Pulmonary effort is normal. No respiratory distress.     Breath sounds: Normal breath sounds. No wheezing or rales.  Musculoskeletal:     Cervical back: Neck supple.  Skin:    Comments: She has approximately 1 x 1 cm area of erythema with slight pigmentation along the posterior border.  Neurological:     Mental Status: She is alert.    BP 140/80 (BP Location: Left Arm, Patient Position: Sitting, Cuff Size: Normal)    Pulse 95    Temp  98.3 F (36.8 C) (Oral)    Ht 5\' 1"  (1.549 m)    Wt 231 lb 3.2 oz (104.9 kg)    SpO2 97%    BMI 43.68 kg/m  Wt Readings from Last 3 Encounters:  08/17/21 231 lb 3.2 oz (104.9 kg)  08/16/20 216 lb (98 kg)  03/24/20 219 lb 3.2 oz (99.4 kg)     Health Maintenance Due  Topic Date Due   Zoster Vaccines- Shingrix (1 of 2) Never done   COLONOSCOPY (Pts 45-40yrs Insurance coverage will need to be confirmed)  11/03/2019   COVID-19 Vaccine (4 - Booster for [place] series) 08/02/2020    There are no preventive care reminders to display for this patient.  Lab Results  Component Value Date   TSH 1.47 01/16/2018   Lab Results  Component Value Date   WBC 10.0 01/22/2020   HGB 13.2 01/22/2020   HCT 41.9 01/22/2020   MCV 92.5 01/22/2020   PLT 189 01/22/2020   Lab Results  Component Value Date   NA 137 01/25/2020   K 3.7 01/25/2020   CO2 25 01/25/2020   GLUCOSE 71 01/25/2020   BUN 7 (L) 01/25/2020   CREATININE 0.61 01/25/2020   BILITOT 1.3 (H) 01/25/2020   ALKPHOS 76 01/25/2020   AST 20 01/25/2020   ALT 17 01/25/2020   PROT 6.3 (L) 01/25/2020   ALBUMIN 3.4 (L) 01/25/2020   CALCIUM 8.7 (L) 01/25/2020   ANIONGAP 9 01/25/2020   GFR 89.49 08/19/2018   Lab Results  Component Value Date   CHOL 154 01/24/2020   Lab Results  Component Value Date   HDL 35 (L) 01/24/2020   Lab Results  Component Value Date   LDLCALC 98 01/24/2020   Lab Results  Component Value Date   TRIG 103 01/24/2020   Lab Results  Component Value Date   CHOLHDL 4.4 01/24/2020  No results found for: HGBA1C    Assessment & Plan:   #1 hypertension.  Borderline elevation today.  We have highly recommend she work on some weight loss and continue to monitor and if consistently up around 140 may need to look at additional medication.  Refilled losartan for 1 year  #2 history of GERD.  Symptoms currently stable. -Refill Protonix for 1 year  #3 history of recurrent depression.  Currently stable on  sertraline 150 mg daily.  Refill prescription for 1 year  #4 fatigue.  Likely multifactorial.  She has gained a lot of weight and suspect at least some of this related to deconditioning.  We will check TSH, CBC, CMP, and B12 level.  She does not have any suspicion for obstructive sleep apnea.  #5 nonspecific skin lesion right cheek.  Question irritated seborrheic keratosis.  This is been present for a year and starting to irritate her more.  We will set up referral to skin surgery center for further evaluation  #6 transient insomnia -Refill Ambien for infrequent use.   Meds ordered this encounter  Medications   losartan (COZAAR) 100 MG tablet    Sig: Take 1 tablet (100 mg total) by mouth daily.    Dispense:  90 tablet    Refill:  3   ondansetron (ZOFRAN-ODT) 4 MG disintegrating tablet    Sig: DISSOLVE ONE TABLET ON TONGUE EVERY 8 HOURS AS NEEDED    Dispense:  90 tablet    Refill:  1   pantoprazole (PROTONIX) 40 MG tablet    Sig: Take 1 tablet (40 mg total) by mouth daily.    Dispense:  90 tablet    Refill:  3   sertraline (ZOLOFT) 100 MG tablet    Sig: TAKE ONE AND ONE-HALF TABLETS BY MOUTH EVERY DAY    Dispense:  135 tablet    Refill:  3   fluconazole (DIFLUCAN) 100 MG tablet    Sig: Take 1 tablet (100 mg total) by mouth daily as needed.    Dispense:  20 tablet    Refill:  1   zolpidem (AMBIEN) 5 MG tablet    Sig: Take 1 tablet (5 mg total) by mouth at bedtime as needed for sleep.    Dispense:  30 tablet    Refill:  0    Follow-up: No follow-ups on file.    Carolann Littler, MD

## 2021-08-17 NOTE — Patient Instructions (Signed)
Consider Shingrix vaccine in the next year  Consider setting up repeat colonoscopy  I am putting in dermatology referral.

## 2021-09-27 ENCOUNTER — Telehealth: Payer: Self-pay | Admitting: Family Medicine

## 2021-09-27 DIAGNOSIS — C4441 Basal cell carcinoma of skin of scalp and neck: Secondary | ICD-10-CM | POA: Diagnosis not present

## 2021-09-27 DIAGNOSIS — D485 Neoplasm of uncertain behavior of skin: Secondary | ICD-10-CM | POA: Diagnosis not present

## 2021-09-27 NOTE — Telephone Encounter (Signed)
Unable to send results that were from another office. Patient will need to contact the office that did the biopsy.

## 2021-09-27 NOTE — Telephone Encounter (Signed)
Patient called because she has been referred to dermatology by Dr.Burchette. Dermatology was trying to figure out what the result was of a biopsy done on her leg about two years ago was. Patient states something was removed from her leg and sent off but she could not remember the results.        Please advise

## 2021-09-27 NOTE — Telephone Encounter (Signed)
Pt said dr Elease Hashimoto did the biopsy and sent biopsy to pathologist to read it

## 2021-09-28 NOTE — Telephone Encounter (Signed)
Spoke with the patient. She would like this faxed to [facility] dermatology. Results have been faxed over.

## 2021-10-19 DIAGNOSIS — H2513 Age-related nuclear cataract, bilateral: Secondary | ICD-10-CM | POA: Diagnosis not present

## 2021-11-03 ENCOUNTER — Encounter: Payer: Self-pay | Admitting: Gastroenterology

## 2021-11-09 ENCOUNTER — Telehealth: Payer: Self-pay | Admitting: Family Medicine

## 2021-11-09 NOTE — Telephone Encounter (Signed)
Left message for patient to call back and schedule Medicare Annual Wellness Visit (AWV) either virtually or in office. Left  my Linda Blackburn number [phone] ? ? ?Last AWV ;11/23/20 ?please schedule at anytime with [facility] Nurse Health Advisor 1 or 2 ? ? ?This should be a 45 minute visit.  ?

## 2021-11-22 DIAGNOSIS — H25811 Combined forms of age-related cataract, right eye: Secondary | ICD-10-CM | POA: Diagnosis not present

## 2021-11-22 DIAGNOSIS — H268 Other specified cataract: Secondary | ICD-10-CM | POA: Diagnosis not present

## 2021-11-22 DIAGNOSIS — H2511 Age-related nuclear cataract, right eye: Secondary | ICD-10-CM | POA: Diagnosis not present

## 2021-11-22 HISTORY — PX: CATARACT EXTRACTION: SUR2

## 2021-11-28 ENCOUNTER — Ambulatory Visit (INDEPENDENT_AMBULATORY_CARE_PROVIDER_SITE_OTHER): Payer: Medicare Other

## 2021-11-28 VITALS — Ht 61.0 in | Wt 238.0 lb

## 2021-11-28 DIAGNOSIS — Z0000 Encounter for general adult medical examination without abnormal findings: Secondary | ICD-10-CM | POA: Diagnosis not present

## 2021-11-28 NOTE — Progress Notes (Signed)
?I connected with Kindell Strada today by telephone and verified that I am speaking with the correct person using two identifiers. ?Location patient: home ?Location provider: work ?Persons participating in the virtual visit: Talon, Witting LPN. ?  ?I discussed the limitations, risks, security and privacy concerns of performing an evaluation and management service by telephone and the availability of in person appointments. I also discussed with the patient that there may be a patient responsible charge related to this service. The patient expressed understanding and verbally consented to this telephonic visit.  ?  ?Interactive audio and video telecommunications were attempted between this provider and patient, however failed, due to patient having technical difficulties OR patient did not have access to video capability.  We continued and completed visit with audio only. ? ?  ? ?Vital signs may be patient reported or missing. ? ?Subjective:  ? Linda Blackburn is a 71 y.o. female who presents for Medicare Annual (Subsequent) preventive examination. ? ?Review of Systems    ? ?Cardiac Risk Factors include: advanced age (>15mn, >>19women);hypertension;obesity (BMI >30kg/m2) ? ?   ?Objective:  ?  ?Today's Vitals  ? 11/28/21 1327 11/28/21 1329  ?Weight: 238 lb (108 kg)   ?Height: '5\' 1"'$  (1.549 m)   ?PainSc:  2   ? ?Body mass index is 44.97 kg/m?. ? ? ?  11/28/2021  ?  1:41 PM 11/23/2020  ?  2:14 PM 03/03/2020  ?  9:12 AM 01/21/2020  ?  3:25 PM 01/21/2020  ? 12:42 PM 10/14/2019  ?  1:27 PM 05/27/2019  ?  8:46 AM  ?Advanced Directives  ?Does Patient Have a Medical Advance Directive? Yes Yes No Yes Yes Yes Yes  ?Type of AParamedicof [place] will [place] will  [place] will  [place] Har[place]Living will [place]Living will  ?Does patient want to make changes to medical advance directive?  No -  Patient declined  No - Patient declined  No - Patient declined No - Patient declined  ?Copy of [place]in Chart? No - copy requested No - copy requested  No - copy requested  No - copy requested No - copy requested  ?Would patient like information on creating a medical advance directive?   No - Patient declined      ? ? ?Current Medications (verified) ?Outpatient Encounter Medications as of 11/28/2021  ?Medication Sig  ? fluconazole (DIFLUCAN) 100 MG tablet Take 1 tablet (100 mg total) by mouth daily as needed.  ? losartan (COZAAR) 100 MG tablet Take 1 tablet (100 mg total) by mouth daily.  ? ondansetron (ZOFRAN-ODT) 4 MG disintegrating tablet DISSOLVE ONE TABLET ON TONGUE EVERY 8 HOURS AS NEEDED  ? pantoprazole (PROTONIX) 40 MG tablet Take 1 tablet (40 mg total) by mouth daily.  ? sertraline (ZOLOFT) 100 MG tablet TAKE ONE AND ONE-HALF TABLETS BY MOUTH EVERY DAY  ? Vibegron 75 MG TABS   ? zolpidem (AMBIEN) 5 MG tablet Take 1 tablet (5 mg total) by mouth at bedtime as needed for sleep.  ? ?No facility-administered encounter medications on file as of 11/28/2021.  ? ? ?Allergies (verified) ?Other, Buprenorphine hcl, Codeine, Dilaudid [hydromorphone hcl], and Morphine and related  ? ?History: ?Past Medical History:  ?Diagnosis Date  ? Abnormal uterine bleeding   ? Amenorrhea   ? Anxiety   ? Depression   ? Gallstones   ? Hay fever   ? HTN (  hypertension)   ? Pancreatitis   ? Urine incontinence   ? UTI (urinary tract infection)   ? reoccuring   ? ?Past Surgical History:  ?Procedure Laterality Date  ? ABDOMINAL HYSTERECTOMY  1984  ? fibroids  ? APPENDECTOMY  1984  ? BILIARY DILATION  05/29/2019  ? Procedure: BILIARY DILATION;  Surgeon: Irving Copas., MD;  Location: [facility];  Service: Gastroenterology;;  ? BIOPSY  05/29/2019  ? Procedure: BIOPSY;  Surgeon: Irving Copas., MD;  Location: [facility];  Service: Gastroenterology;;  ? CATARACT EXTRACTION Right 11/22/2021  ?  CHOLECYSTECTOMY  09/08/1999  ? ERCP N/A 05/29/2019  ? Procedure: ENDOSCOPIC RETROGRADE CHOLANGIOPANCREATOGRAPHY (ERCP);  Surgeon: Irving Copas., MD;  Location: [facility];  Service: Gastroenterology;  Laterality: N/A;  ? ERCP W/ SPHINCTEROTOMY AND BALLOON DILATION  08/2008  ? ESOPHAGOGASTRODUODENOSCOPY (EGD) WITH PROPOFOL N/A 05/29/2019  ? Procedure: ESOPHAGOGASTRODUODENOSCOPY (EGD) WITH PROPOFOL;  Surgeon: Rush Landmark Telford Nab., MD;  Location: [facility];  Service: Gastroenterology;  Laterality: N/A;  ? OOPHORECTOMY    ? Only 1 was taken  ? POLYPECTOMY  05/29/2019  ? Procedure: POLYPECTOMY;  Surgeon: Rush Landmark Telford Nab., MD;  Location: [facility];  Service: Gastroenterology;;  ? REMOVAL OF STONES  05/29/2019  ? Procedure: REMOVAL OF STONES;  Surgeon: Mansouraty, Telford Nab., MD;  Location: [facility];  Service: Gastroenterology;;  ? TONSILLECTOMY    ? TUBAL LIGATION    ? ?Family History  ?Problem Relation Age of Onset  ? Alcohol abuse Mother   ? Prostate cancer Father   ? Stroke Maternal Aunt   ? Hiatal hernia Maternal Aunt   ? Prostate cancer Paternal Grandfather   ? Cerebral palsy Granddaughter   ? Breast cancer Neg Hx   ? ?Social History  ? ?Socioeconomic History  ? Marital status: Widowed  ?  Spouse name: Not on file  ? Number of children: 2  ? Years of education: Not on file  ? Highest education level: Not on file  ?Occupational History  ? Occupation: retired  ?Tobacco Use  ? Smoking status: Never  ? Smokeless tobacco: Never  ?Vaping Use  ? Vaping Use: Never used  ?Substance and Sexual Activity  ? Alcohol use: Not Currently  ?  Comment: occ - hardly every   ? Drug use: No  ? Sexual activity: Not Currently  ?  Partners: Male  ?  Birth control/protection: Surgical  ?Other Topics Concern  ? Not on file  ?Social History Narrative  ? 10/14/2019: Lives alone in apartment.   ? Has two sons, one in [place] and one in [place], 5 grandchildren  ?  Has considered moving closer to sons but likes [place] too much.   ? Neighbors good social support  ? Enjoys watching soap operas, walking when she feels well, but recently struggling with more frequent episodes of pancreatitis requiring hospitalization.  ? Keeps positive attitude, "one day at a time"  ?   ? ?Social Determinants of Health  ? ?Financial Resource Strain: Low Risk   ? Difficulty of Paying Living Expenses: Not hard at all  ?Food Insecurity: No Food Insecurity  ? Worried About Charity fundraiser in the Last Year: Never true  ? Ran Out of Food in the Last Year: Never true  ?Transportation Needs: No Transportation Needs  ? Lack of Transportation (Medical): No  ? Lack of Transportation (Non-Medical): No  ?Physical Activity: Inactive  ? Days of Exercise per Week: 0 days  ? Minutes of Exercise per Session: 0  min  ?Stress: No Stress Concern Present  ? Feeling of Stress : Only a little  ?Social Connections: Not on file  ? ? ?Tobacco Counseling ?Counseling given: Not Answered ? ? ?Clinical Intake: ? ?Pre-visit preparation completed: Yes ? ?Pain : 0-10 ?Pain Score: 2  ?Pain Type: Chronic pain ?Pain Location: Back ?Pain Orientation: Lower ?Pain Descriptors / Indicators: Throbbing ?Pain Onset: More than a month ago ?Pain Frequency: Constant ? ?  ? ?Nutritional Status: BMI > 30  Obese ?Nutritional Risks: Nausea/ vomitting/ diarrhea (nausea every morning, vomiting, 3 weeks ago, diarrhea) ?Diabetes: No ? ?How often do you need to have someone help you when you read instructions, pamphlets, or other written materials from your doctor or pharmacy?: 1 - Never ?What is the last grade level you completed in school?: associates degree ? ?Diabetic? no ? ?Interpreter Needed?: No ? ?Information entered by :: NAllen LPN ? ? ?Activities of Daily Living ? ?  11/28/2021  ?  1:43 PM 11/24/2021  ? 11:15 AM  ?In your present state of health, do you have any difficulty performing the following activities:  ?Hearing? 0 0  ?Vision? 0 0  ?Difficulty concentrating or making decisions? 0 0  ?Walking or  climbing stairs? 1 1  ?Dressing or bathing? 0 0  ?Doing errands, shopping? 0 0  ?Preparing Food and eating ? N N  ?Using the Toilet? N N  ?In the past six months, have you accidently leaked urine? Y Y  ?Do you have p

## 2021-11-28 NOTE — Patient Instructions (Signed)
Ms. Russman , ?Thank you for taking time to come for your Medicare Wellness Visit. I appreciate your ongoing commitment to your health goals. Please review the following plan we discussed and let me know if I can assist you in the future.  ? ?Screening recommendations/referrals: ?Colonoscopy: due ?Mammogram: completed 08/11/2021, due 08/12/2022 ?Bone Density: completed 09/29/2020 ?Recommended yearly ophthalmology/optometry visit for glaucoma screening and checkup ?Recommended yearly dental visit for hygiene and checkup ? ?Vaccinations: ?Influenza vaccine: completed 06/16/2021, due next flu season ?Pneumococcal vaccine: completed 07/22/2015 ?Tdap vaccine: due ?Shingles vaccine: discussed   ?Covid-19: 06/07/2020, 11/06/2019, 10/12/2019 ? ?Advanced directives: Please bring a copy of your POA (Power of Attorney) and/or Living Will to your next appointment.  ? ?Conditions/risks identified: none ? ?Next appointment: Follow up in one year for your annual wellness visit  ? ? ?Preventive Care 42 Years and Older, Female ?Preventive care refers to lifestyle choices and visits with your health care provider that can promote health and wellness. ?What does preventive care include? ?A yearly physical exam. This is also called an annual well check. ?Dental exams once or twice a year. ?Routine eye exams. Ask your health care provider how often you should have your eyes checked. ?Personal lifestyle choices, including: ?Daily care of your teeth and gums. ?Regular physical activity. ?Eating a healthy diet. ?Avoiding tobacco and drug use. ?Limiting alcohol use. ?Practicing safe sex. ?Taking low-dose aspirin every day. ?Taking vitamin and mineral supplements as recommended by your health care provider. ?What happens during an annual well check? ?The services and screenings done by your health care provider during your annual well check will depend on your age, overall health, lifestyle risk factors, and family history of disease. ?Counseling   ?Your health care provider may ask you questions about your: ?Alcohol use. ?Tobacco use. ?Drug use. ?Emotional well-being. ?Home and relationship well-being. ?Sexual activity. ?Eating habits. ?History of falls. ?Memory and ability to understand (cognition). ?Work and work Statistician. ?Reproductive health. ?Screening  ?You may have the following tests or measurements: ?Height, weight, and BMI. ?Blood pressure. ?Lipid and cholesterol levels. These may be checked every 5 years, or more frequently if you are over 50 years old. ?Skin check. ?Lung cancer screening. You may have this screening every year starting at age 29 if you have a 30-pack-year history of smoking and currently smoke or have quit within the past 15 years. ?Fecal occult blood test (FOBT) of the stool. You may have this test every year starting at age 72. ?Flexible sigmoidoscopy or colonoscopy. You may have a sigmoidoscopy every 5 years or a colonoscopy every 10 years starting at age 76. ?Hepatitis C blood test. ?Hepatitis B blood test. ?Sexually transmitted disease (STD) testing. ?Diabetes screening. This is done by checking your blood sugar (glucose) after you have not eaten for a while (fasting). You may have this done every 1-3 years. ?Bone density scan. This is done to screen for osteoporosis. You may have this done starting at age 19. ?Mammogram. This may be done every 1-2 years. Talk to your health care provider about how often you should have regular mammograms. ?Talk with your health care provider about your test results, treatment options, and if necessary, the need for more tests. ?Vaccines  ?Your health care provider may recommend certain vaccines, such as: ?Influenza vaccine. This is recommended every year. ?Tetanus, diphtheria, and acellular pertussis (Tdap, Td) vaccine. You may need a Td booster every 10 years. ?Zoster vaccine. You may need this after age 15. ?Pneumococcal 13-valent conjugate (PCV13) vaccine.  One dose is recommended  after age 96. ?Pneumococcal polysaccharide (PPSV23) vaccine. One dose is recommended after age 13. ?Talk to your health care provider about which screenings and vaccines you need and how often you need them. ?This information is not intended to replace advice given to you by your health care provider. Make sure you discuss any questions you have with your health care provider. ?Document Released: 09/17/2015 Document Revised: 05/10/2016 Document Reviewed: 06/22/2015 ?Elsevier Interactive Patient Education ? 2017 [place]. ? ?Fall Prevention in the Home ?Falls can cause injuries. They can happen to people of all ages. There are many things you can do to make your home safe and to help prevent falls. ?What can I do on the outside of my home? ?Regularly fix the edges of walkways and driveways and fix any cracks. ?Remove anything that might make you trip as you walk through a door, such as a raised step or threshold. ?Trim any bushes or trees on the path to your home. ?Use bright outdoor lighting. ?Clear any walking paths of anything that might make someone trip, such as rocks or tools. ?Regularly check to see if handrails are loose or broken. Make sure that both sides of any steps have handrails. ?Any raised decks and porches should have guardrails on the edges. ?Have any leaves, snow, or ice cleared regularly. ?Use sand or salt on walking paths during winter. ?Clean up any spills in your garage right away. This includes oil or grease spills. ?What can I do in the bathroom? ?Use night lights. ?Install grab bars by the toilet and in the tub and shower. Do not use towel bars as grab bars. ?Use non-skid mats or decals in the tub or shower. ?If you need to sit down in the shower, use a plastic, non-slip stool. ?Keep the floor dry. Clean up any water that spills on the floor as soon as it happens. ?Remove soap buildup in the tub or shower regularly. ?Attach bath mats securely with double-sided non-slip rug tape. ?Do not  have throw rugs and other things on the floor that can make you trip. ?What can I do in the bedroom? ?Use night lights. ?Make sure that you have a light by your bed that is easy to reach. ?Do not use any sheets or blankets that are too big for your bed. They should not hang down onto the floor. ?Have a firm chair that has side arms. You can use this for support while you get dressed. ?Do not have throw rugs and other things on the floor that can make you trip. ?What can I do in the kitchen? ?Clean up any spills right away. ?Avoid walking on wet floors. ?Keep items that you use a lot in easy-to-reach places. ?If you need to reach something above you, use a strong step stool that has a grab bar. ?Keep electrical cords out of the way. ?Do not use floor polish or wax that makes floors slippery. If you must use wax, use non-skid floor wax. ?Do not have throw rugs and other things on the floor that can make you trip. ?What can I do with my stairs? ?Do not leave any items on the stairs. ?Make sure that there are handrails on both sides of the stairs and use them. Fix handrails that are broken or loose. Make sure that handrails are as long as the stairways. ?Check any carpeting to make sure that it is firmly attached to the stairs. Fix any carpet that is loose or  worn. ?Avoid having throw rugs at the top or bottom of the stairs. If you do have throw rugs, attach them to the floor with carpet tape. ?Make sure that you have a light switch at the top of the stairs and the bottom of the stairs. If you do not have them, ask someone to add them for you. ?What else can I do to help prevent falls? ?Wear shoes that: ?Do not have high heels. ?Have rubber bottoms. ?Are comfortable and fit you well. ?Are closed at the toe. Do not wear sandals. ?If you use a stepladder: ?Make sure that it is fully opened. Do not climb a closed stepladder. ?Make sure that both sides of the stepladder are locked into place. ?Ask someone to hold it for  you, if possible. ?Clearly mark and make sure that you can see: ?Any grab bars or handrails. ?First and last steps. ?Where the edge of each step is. ?Use tools that help you move around (mobility aids) if they

## 2021-12-06 DIAGNOSIS — H25812 Combined forms of age-related cataract, left eye: Secondary | ICD-10-CM | POA: Diagnosis not present

## 2021-12-06 DIAGNOSIS — H2512 Age-related nuclear cataract, left eye: Secondary | ICD-10-CM | POA: Diagnosis not present

## 2021-12-21 DIAGNOSIS — C4491 Basal cell carcinoma of skin, unspecified: Secondary | ICD-10-CM | POA: Diagnosis not present

## 2022-01-11 DIAGNOSIS — C4441 Basal cell carcinoma of skin of scalp and neck: Secondary | ICD-10-CM | POA: Diagnosis not present

## 2022-02-22 DIAGNOSIS — N3946 Mixed incontinence: Secondary | ICD-10-CM | POA: Diagnosis not present

## 2022-02-22 DIAGNOSIS — R350 Frequency of micturition: Secondary | ICD-10-CM | POA: Diagnosis not present

## 2022-04-19 DIAGNOSIS — H16203 Unspecified keratoconjunctivitis, bilateral: Secondary | ICD-10-CM | POA: Diagnosis not present

## 2022-04-21 ENCOUNTER — Telehealth: Payer: Self-pay | Admitting: Family Medicine

## 2022-04-21 NOTE — Telephone Encounter (Signed)
Pt call and stated she want a call back to let her know if she need to take the RSV and the new Covid shot's

## 2022-04-24 NOTE — Telephone Encounter (Signed)
Patient informed of the message and verbalized understanding 

## 2022-05-11 ENCOUNTER — Other Ambulatory Visit: Payer: Self-pay | Admitting: Family Medicine

## 2022-05-11 DIAGNOSIS — Z1231 Encounter for screening mammogram for malignant neoplasm of breast: Secondary | ICD-10-CM

## 2022-06-02 ENCOUNTER — Ambulatory Visit (INDEPENDENT_AMBULATORY_CARE_PROVIDER_SITE_OTHER): Payer: Medicare Other | Admitting: *Deleted

## 2022-06-02 DIAGNOSIS — Z23 Encounter for immunization: Secondary | ICD-10-CM

## 2022-08-14 ENCOUNTER — Ambulatory Visit
Admission: RE | Admit: 2022-08-14 | Discharge: 2022-08-14 | Disposition: A | Discharge status: Home / Self Care | Payer: Medicare Other | Source: Ambulatory Visit | Attending: Family Medicine | Admitting: Family Medicine

## 2022-08-14 DIAGNOSIS — Z1231 Encounter for screening mammogram for malignant neoplasm of breast: Secondary | ICD-10-CM

## 2022-08-22 ENCOUNTER — Encounter: Payer: Self-pay | Admitting: Family Medicine

## 2022-08-22 ENCOUNTER — Ambulatory Visit (INDEPENDENT_AMBULATORY_CARE_PROVIDER_SITE_OTHER): Payer: Medicare Other | Admitting: Family Medicine

## 2022-08-22 VITALS — BP 122/80 | HR 77 | Temp 98.0°F | Ht 61.81 in | Wt 243.8 lb

## 2022-08-22 DIAGNOSIS — K2100 Gastro-esophageal reflux disease with esophagitis, without bleeding: Secondary | ICD-10-CM

## 2022-08-22 DIAGNOSIS — F339 Major depressive disorder, recurrent, unspecified: Secondary | ICD-10-CM | POA: Diagnosis not present

## 2022-08-22 DIAGNOSIS — R7989 Other specified abnormal findings of blood chemistry: Secondary | ICD-10-CM

## 2022-08-22 DIAGNOSIS — I10 Essential (primary) hypertension: Secondary | ICD-10-CM

## 2022-08-22 DIAGNOSIS — Q453 Other congenital malformations of pancreas and pancreatic duct: Secondary | ICD-10-CM | POA: Diagnosis not present

## 2022-08-22 DIAGNOSIS — F5104 Psychophysiologic insomnia: Secondary | ICD-10-CM

## 2022-08-22 LAB — COMPREHENSIVE METABOLIC PANEL
ALT: 31 U/L (ref 0–35)
AST: 29 U/L (ref 0–37)
Albumin: 4.6 g/dL (ref 3.5–5.2)
Alkaline Phosphatase: 87 U/L (ref 39–117)
BUN: 16 mg/dL (ref 6–23)
CO2: 23 mEq/L (ref 19–32)
Calcium: 9.7 mg/dL (ref 8.4–10.5)
Chloride: 103 mEq/L (ref 96–112)
Creatinine, Ser: 0.88 mg/dL (ref 0.40–1.20)
GFR: 65.1 mL/min (ref >60.00)
Glucose, Bld: 100 mg/dL — ABNORMAL HIGH (ref 70–99)
Potassium: 3.9 mEq/L (ref 3.5–5.1)
Sodium: 135 mEq/L (ref 135–145)
Total Bilirubin: 0.9 mg/dL (ref 0.2–1.2)
Total Protein: 7.8 g/dL (ref 6.0–8.3)

## 2022-08-22 LAB — CBC WITH DIFFERENTIAL/PLATELET
Basophils Absolute: 0.1 10*3/uL (ref 0.0–0.1)
Basophils Relative: 1 % (ref 0.0–3.0)
Eosinophils Absolute: 0.2 10*3/uL (ref 0.0–0.7)
Eosinophils Relative: 2.7 % (ref 0.0–5.0)
HCT: 45.1 % (ref 36.0–46.0)
Hemoglobin: 15.3 g/dL — ABNORMAL HIGH (ref 12.0–15.0)
Lymphocytes Relative: 46.6 % — ABNORMAL HIGH (ref 12.0–46.0)
Lymphs Abs: 2.7 10*3/uL (ref 0.7–4.0)
MCHC: 34 g/dL (ref 30.0–36.0)
MCV: 88.1 fl (ref 78.0–100.0)
Monocytes Absolute: 0.4 10*3/uL (ref 0.1–1.0)
Monocytes Relative: 7.3 % (ref 3.0–12.0)
Neutro Abs: 2.5 10*3/uL (ref 1.4–7.7)
Neutrophils Relative %: 42.4 % — ABNORMAL LOW (ref 43.0–77.0)
Platelets: 262 10*3/uL (ref 150.0–400.0)
RBC: 5.12 Mil/uL — ABNORMAL HIGH (ref 3.87–5.11)
RDW: 13.8 % (ref 11.5–15.5)
WBC: 5.8 10*3/uL (ref 4.0–10.5)

## 2022-08-22 LAB — VITAMIN B12: Vitamin B-12: 244 pg/mL (ref 211–911)

## 2022-08-22 MED ORDER — ONDANSETRON 8 MG PO TBDP: 8.0000 mg | ORAL_TABLET | Freq: Three times a day (TID) | ORAL | 2 refills | Status: DC | PRN

## 2022-08-22 MED ORDER — PANTOPRAZOLE SODIUM 40 MG PO TBEC: 40.0000 mg | DELAYED_RELEASE_TABLET | Freq: Every day | ORAL | 3 refills | Status: DC

## 2022-08-22 MED ORDER — FLUCONAZOLE 100 MG PO TABS: 100.0000 mg | ORAL_TABLET | Freq: Every day | ORAL | 1 refills | Status: DC | PRN

## 2022-08-22 MED ORDER — SERTRALINE HCL 100 MG PO TABS: ORAL_TABLET | ORAL | 3 refills | Status: DC

## 2022-08-22 MED ORDER — LOSARTAN POTASSIUM 100 MG PO TABS: 100.0000 mg | ORAL_TABLET | Freq: Every day | ORAL | 3 refills | Status: DC

## 2022-08-22 MED ORDER — ZOLPIDEM TARTRATE 5 MG PO TABS: 5.0000 mg | ORAL_TABLET | Freq: Every evening | ORAL | 0 refills | Status: DC | PRN

## 2022-08-22 NOTE — Patient Instructions (Signed)
Start over the counter B12 1,000 mcg daily

## 2022-08-22 NOTE — Progress Notes (Signed)
Established Patient Office Visit  Subjective   Patient ID: Linda Blackburn, female    DOB: 1951-03-27  Age: 71 y.o. MRN: [id number]  Chief Complaint  Patient presents with   Annual Exam    HPI   Linda Blackburn is seen today for medical follow-up.  She has history of pancreas divisum with acute recurrent pancreatitis.  She states she had 3 episodes of pancreatitis this past year but was able to manage them all at home.  She does request refill of Zofran to have on hand.  She had recent cataract surgery.  That went well.  She is also had RSV vaccine and Shingrix vaccine.  She had slightly low B12 level of 235 and was advised to start over-the-counter B12 but never did.  She does take chronic Protonix for GERD.  At risk for B12 deficiency.  Requesting refill Protonix.  No recent dysphagia.  She has history of hypertension treated with losartan and requesting refills.  She has history of recurrent depression currently treated with sertraline 100 mg daily.  She was on 150 mg but recently went down her own to 100 mg and depression symptoms stable.  Longstanding history of insomnia.  She uses Ambien intermittently for that.  Requesting refills.  Past Medical History:  Diagnosis Date   Abnormal uterine bleeding    Amenorrhea    Anxiety    Depression    Gallstones    Hay fever    HTN (hypertension)    Pancreatitis    Urine incontinence    UTI (urinary tract infection)    reoccuring    Past Surgical History:  Procedure Laterality Date   ABDOMINAL HYSTERECTOMY  1984   fibroids   APPENDECTOMY  1984   BILIARY DILATION  05/29/2019   Procedure: BILIARY DILATION;  Surgeon: Irving Copas., MD;  Location: [facility];  Service: Gastroenterology;;   BIOPSY  05/29/2019   Procedure: BIOPSY;  Surgeon: Irving Copas., MD;  Location: [facility];  Service: Gastroenterology;;   CATARACT EXTRACTION Right 11/22/2021   CHOLECYSTECTOMY  09/08/1999   ERCP N/A 05/29/2019    Procedure: ENDOSCOPIC RETROGRADE CHOLANGIOPANCREATOGRAPHY (ERCP);  Surgeon: Irving Copas., MD;  Location: [facility];  Service: Gastroenterology;  Laterality: N/A;   ERCP W/ SPHINCTEROTOMY AND BALLOON DILATION  08/2008   ESOPHAGOGASTRODUODENOSCOPY (EGD) WITH PROPOFOL N/A 05/29/2019   Procedure: ESOPHAGOGASTRODUODENOSCOPY (EGD) WITH PROPOFOL;  Surgeon: Rush Landmark Telford Nab., MD;  Location: [facility];  Service: Gastroenterology;  Laterality: N/A;   OOPHORECTOMY     Only 1 was taken   POLYPECTOMY  05/29/2019   Procedure: POLYPECTOMY;  Surgeon: Mansouraty, Telford Nab., MD;  Location: [facility];  Service: Gastroenterology;;   REMOVAL OF STONES  05/29/2019   Procedure: REMOVAL OF STONES;  Surgeon: Irving Copas., MD;  Location: [facility];  Service: Gastroenterology;;   TONSILLECTOMY     TUBAL LIGATION      reports that she has never smoked. She has never used smokeless tobacco. She reports that she does not currently use alcohol. She reports that she does not use drugs. family history includes Alcohol abuse in her mother; Cerebral palsy in her granddaughter; Hiatal hernia in her maternal aunt; Prostate cancer in her father and paternal grandfather; Stroke in her maternal aunt. Allergies  Allergen Reactions   Other Hives and Swelling    AQUASONIC [place] GEL   Buprenorphine Hcl     "In a scarry movie" weird thoughts   Codeine     GI upset   Dilaudid [Hydromorphone  Hcl] Itching   Morphine And Related     "In a scarry movie" weird thoughts    Review of Systems  Constitutional:  Negative for chills, fever and malaise/fatigue.  Eyes:  Negative for blurred vision.  Respiratory:  Negative for shortness of breath.   Cardiovascular:  Negative for chest pain.  Gastrointestinal:  Negative for abdominal pain, heartburn, nausea and vomiting.  Neurological:  Negative for dizziness, weakness and headaches.      Objective:     BP 122/80 (BP Location: Left Arm, Patient  Position: Sitting, Cuff Size: Large)   Pulse 77   Temp 98 F (36.7 C) (Oral)   Ht 5' 1.81" (1.57 m)   Wt 243 lb 12.8 oz (110.6 kg)   SpO2 98%   BMI 44.86 kg/m    Physical Exam Vitals reviewed.  Constitutional:      Appearance: She is well-developed.  Eyes:     Pupils: Pupils are equal, round, and reactive to light.  Neck:     Thyroid: No thyromegaly.     Vascular: No JVD.  Cardiovascular:     Rate and Rhythm: Normal rate and regular rhythm.     Heart sounds:     No gallop.  Pulmonary:     Effort: Pulmonary effort is normal. No respiratory distress.     Breath sounds: Normal breath sounds. No wheezing or rales.  Musculoskeletal:     Cervical back: Neck supple.     Right lower leg: No edema.     Left lower leg: No edema.  Neurological:     General: No focal deficit present.     Mental Status: She is alert.      No results found for any visits on 08/22/22.    The 10-year ASCVD risk score (Arnett DK, et al., 2019) is: 15.5%    Assessment & Plan:   Problem List Items Addressed This Visit       Unprioritized   Gastroesophageal reflux disease   Relevant Medications   pantoprazole (PROTONIX) 40 MG tablet   ondansetron (ZOFRAN-ODT) 8 MG disintegrating tablet   Hypertension - Primary   Relevant Medications   losartan (COZAAR) 100 MG tablet   Other Relevant Orders   CMP   Depression, recurrent (HCC)   Relevant Medications   sertraline (ZOLOFT) 100 MG tablet   Chronic insomnia   Pancreas divisum   Relevant Orders   CBC with Differential/Platelet   CMP   Other Visit Diagnoses     Low vitamin B12 level       Relevant Orders   Vitamin B12     Multiple chronic problems addressed as above.  We sent in refills of losartan, pantoprazole, and sertraline.  Also sent in limited refills of Ambien which she knows to use infrequently for severe insomnia.  -Strongly suggest that she take daily over-the-counter B12 1000 mcg with her chronic PPI use and history of  previous low normal B12 level.  -Also refilled her ondansetron to have on hand to use as needed for nausea.  She is trying to manage her episodes of acute pancreatitis at home after multiple prior hospitalizations over the years.  -Repeat labs as above with CBC, CMP, and B12 level  No follow-ups on file.    Carolann Littler, MD

## 2022-09-30 ENCOUNTER — Other Ambulatory Visit: Payer: Self-pay

## 2022-09-30 ENCOUNTER — Emergency Department (HOSPITAL_COMMUNITY): Payer: Medicare Other

## 2022-09-30 ENCOUNTER — Inpatient Hospital Stay (HOSPITAL_COMMUNITY)
Admission: EM | Admit: 2022-09-30 | Discharge: 2022-10-04 | DRG: 439 | Disposition: A | Discharge status: Home / Self Care | Payer: Medicare Other | Attending: Family Medicine | Admitting: Family Medicine

## 2022-09-30 DIAGNOSIS — I10 Essential (primary) hypertension: Secondary | ICD-10-CM | POA: Diagnosis present

## 2022-09-30 DIAGNOSIS — K5790 Diverticulosis of intestine, part unspecified, without perforation or abscess without bleeding: Secondary | ICD-10-CM | POA: Diagnosis present

## 2022-09-30 DIAGNOSIS — K219 Gastro-esophageal reflux disease without esophagitis: Secondary | ICD-10-CM | POA: Diagnosis present

## 2022-09-30 DIAGNOSIS — Z811 Family history of alcohol abuse and dependence: Secondary | ICD-10-CM | POA: Diagnosis not present

## 2022-09-30 DIAGNOSIS — Z8042 Family history of malignant neoplasm of prostate: Secondary | ICD-10-CM | POA: Diagnosis not present

## 2022-09-30 DIAGNOSIS — F4323 Adjustment disorder with mixed anxiety and depressed mood: Secondary | ICD-10-CM | POA: Diagnosis not present

## 2022-09-30 DIAGNOSIS — Z87440 Personal history of urinary (tract) infections: Secondary | ICD-10-CM

## 2022-09-30 DIAGNOSIS — Q453 Other congenital malformations of pancreas and pancreatic duct: Secondary | ICD-10-CM | POA: Diagnosis not present

## 2022-09-30 DIAGNOSIS — Z66 Do not resuscitate: Secondary | ICD-10-CM | POA: Diagnosis present

## 2022-09-30 DIAGNOSIS — K859 Acute pancreatitis without necrosis or infection, unspecified: Principal | ICD-10-CM | POA: Diagnosis present

## 2022-09-30 DIAGNOSIS — Z888 Allergy status to other drugs, medicaments and biological substances status: Secondary | ICD-10-CM

## 2022-09-30 DIAGNOSIS — Z1152 Encounter for screening for COVID-19: Secondary | ICD-10-CM | POA: Diagnosis not present

## 2022-09-30 DIAGNOSIS — E8770 Fluid overload, unspecified: Secondary | ICD-10-CM | POA: Diagnosis not present

## 2022-09-30 DIAGNOSIS — Z885 Allergy status to narcotic agent status: Secondary | ICD-10-CM | POA: Diagnosis not present

## 2022-09-30 DIAGNOSIS — Z79899 Other long term (current) drug therapy: Secondary | ICD-10-CM

## 2022-09-30 DIAGNOSIS — Z6841 Body Mass Index (BMI) 40.0 and over, adult: Secondary | ICD-10-CM | POA: Diagnosis not present

## 2022-09-30 DIAGNOSIS — R109 Unspecified abdominal pain: Secondary | ICD-10-CM | POA: Diagnosis not present

## 2022-09-30 DIAGNOSIS — Z823 Family history of stroke: Secondary | ICD-10-CM | POA: Diagnosis not present

## 2022-09-30 DIAGNOSIS — K861 Other chronic pancreatitis: Secondary | ICD-10-CM | POA: Diagnosis present

## 2022-09-30 DIAGNOSIS — R1111 Vomiting without nausea: Secondary | ICD-10-CM | POA: Diagnosis not present

## 2022-09-30 DIAGNOSIS — J9811 Atelectasis: Secondary | ICD-10-CM | POA: Diagnosis not present

## 2022-09-30 DIAGNOSIS — R0600 Dyspnea, unspecified: Secondary | ICD-10-CM | POA: Diagnosis not present

## 2022-09-30 DIAGNOSIS — R1084 Generalized abdominal pain: Secondary | ICD-10-CM | POA: Diagnosis not present

## 2022-09-30 DIAGNOSIS — I700 Atherosclerosis of aorta: Secondary | ICD-10-CM | POA: Diagnosis not present

## 2022-09-30 DIAGNOSIS — M545 Low back pain, unspecified: Secondary | ICD-10-CM | POA: Diagnosis not present

## 2022-09-30 LAB — CBC WITH DIFFERENTIAL/PLATELET
Abs Immature Granulocytes: 0.05 10*3/uL (ref 0.00–0.07)
Basophils Absolute: 0 10*3/uL (ref 0.0–0.1)
Basophils Relative: 0 %
Eosinophils Absolute: 0 10*3/uL (ref 0.0–0.5)
Eosinophils Relative: 0 %
HCT: 48.3 % — ABNORMAL HIGH (ref 36.0–46.0)
Hemoglobin: 15.5 g/dL — ABNORMAL HIGH (ref 12.0–15.0)
Immature Granulocytes: 1 %
Lymphocytes Relative: 15 %
Lymphs Abs: 1.6 10*3/uL (ref 0.7–4.0)
MCH: 28.9 pg (ref 26.0–34.0)
MCHC: 32.1 g/dL (ref 30.0–36.0)
MCV: 90.1 fL (ref 80.0–100.0)
Monocytes Absolute: 0.4 10*3/uL (ref 0.1–1.0)
Monocytes Relative: 4 %
Neutro Abs: 8.8 10*3/uL — ABNORMAL HIGH (ref 1.7–7.7)
Neutrophils Relative %: 80 %
Platelets: 239 10*3/uL (ref 150–400)
RBC: 5.36 MIL/uL — ABNORMAL HIGH (ref 3.87–5.11)
RDW: 13.2 % (ref 11.5–15.5)
WBC: 10.8 10*3/uL — ABNORMAL HIGH (ref 4.0–10.5)
nRBC: 0 % (ref 0.0–0.2)

## 2022-09-30 LAB — CREATININE, SERUM
Creatinine, Ser: 0.78 mg/dL (ref 0.44–1.00)
GFR, Estimated: >60 mL/min (ref >60)

## 2022-09-30 LAB — COMPREHENSIVE METABOLIC PANEL
ALT: 32 U/L (ref 0–44)
AST: 34 U/L (ref 15–41)
Albumin: 4.1 g/dL (ref 3.5–5.0)
Alkaline Phosphatase: 73 U/L (ref 38–126)
Anion gap: 7 (ref 5–15)
BUN: 17 mg/dL (ref 8–23)
CO2: 22 mmol/L (ref 22–32)
Calcium: 9 mg/dL (ref 8.9–10.3)
Chloride: 108 mmol/L (ref 98–111)
Creatinine, Ser: 0.89 mg/dL (ref 0.44–1.00)
GFR, Estimated: >60 mL/min (ref >60)
Glucose, Bld: 127 mg/dL — ABNORMAL HIGH (ref 70–99)
Potassium: 4.2 mmol/L (ref 3.5–5.1)
Sodium: 137 mmol/L (ref 135–145)
Total Bilirubin: 0.9 mg/dL (ref 0.3–1.2)
Total Protein: 7 g/dL (ref 6.5–8.1)

## 2022-09-30 LAB — LIPASE, BLOOD: Lipase: 3726 U/L — ABNORMAL HIGH (ref 11–51)

## 2022-09-30 MED ORDER — ACETAMINOPHEN 325 MG PO TABS
650.0000 mg | ORAL_TABLET | Freq: Four times a day (QID) | ORAL | Status: DC | PRN
  Administered 2022-10-01: 650 mg via ORAL
  Filled 2022-09-30: qty 2

## 2022-09-30 MED ORDER — FENTANYL CITRATE PF 50 MCG/ML IJ SOSY
50.0000 ug | PREFILLED_SYRINGE | Freq: Once | INTRAMUSCULAR | Status: DC
  Filled 2022-09-30: qty 1

## 2022-09-30 MED ORDER — SODIUM CHLORIDE 0.9 % IV BOLUS
1000.0000 mL | Freq: Once | INTRAVENOUS | Status: AC
  Administered 2022-09-30: 1000 mL via INTRAVENOUS

## 2022-09-30 MED ORDER — ENOXAPARIN SODIUM 40 MG/0.4ML IJ SOSY
40.0000 mg | PREFILLED_SYRINGE | INTRAMUSCULAR | Status: DC
  Administered 2022-09-30 – 2022-10-03 (×4): 40 mg via SUBCUTANEOUS
  Filled 2022-09-30 (×4): qty 0.4

## 2022-09-30 MED ORDER — FENTANYL CITRATE PF 50 MCG/ML IJ SOSY
50.0000 ug | PREFILLED_SYRINGE | INTRAMUSCULAR | Status: DC | PRN
  Administered 2022-09-30: 50 ug via INTRAVENOUS
  Filled 2022-09-30: qty 1

## 2022-09-30 MED ORDER — PANTOPRAZOLE SODIUM 40 MG IV SOLR
40.0000 mg | Freq: Every day | INTRAVENOUS | Status: DC
  Administered 2022-09-30 – 2022-10-03 (×4): 40 mg via INTRAVENOUS
  Filled 2022-09-30 (×4): qty 10

## 2022-09-30 MED ORDER — ACETAMINOPHEN 650 MG RE SUPP: 650.0000 mg | Freq: Four times a day (QID) | RECTAL | Status: DC | PRN

## 2022-09-30 MED ORDER — SODIUM CHLORIDE 0.9 % IV SOLN: Freq: Once | INTRAVENOUS | Status: AC

## 2022-09-30 MED ORDER — SERTRALINE HCL 100 MG PO TABS
100.0000 mg | ORAL_TABLET | Freq: Every day | ORAL | Status: DC
  Administered 2022-09-30 – 2022-10-04 (×5): 100 mg via ORAL
  Filled 2022-09-30 (×2): qty 2
  Filled 2022-09-30 (×3): qty 1

## 2022-09-30 MED ORDER — LOSARTAN POTASSIUM 50 MG PO TABS
100.0000 mg | ORAL_TABLET | Freq: Every day | ORAL | Status: DC
  Administered 2022-09-30 – 2022-10-04 (×5): 100 mg via ORAL
  Filled 2022-09-30 (×2): qty 2
  Filled 2022-09-30 (×2): qty 4
  Filled 2022-09-30: qty 2

## 2022-09-30 MED ORDER — LACTATED RINGERS IV SOLN: INTRAVENOUS | Status: DC

## 2022-09-30 MED ORDER — FENTANYL CITRATE PF 50 MCG/ML IJ SOSY: 50.0000 ug | PREFILLED_SYRINGE | Freq: Once | INTRAMUSCULAR | Status: DC

## 2022-09-30 MED ORDER — SENNOSIDES-DOCUSATE SODIUM 8.6-50 MG PO TABS: 1.0000 | ORAL_TABLET | Freq: Every evening | ORAL | Status: DC | PRN

## 2022-09-30 MED ORDER — ONDANSETRON HCL 4 MG/2ML IJ SOLN
4.0000 mg | Freq: Four times a day (QID) | INTRAMUSCULAR | Status: DC | PRN
  Administered 2022-09-30 – 2022-10-04 (×11): 4 mg via INTRAVENOUS
  Filled 2022-09-30 (×11): qty 2

## 2022-09-30 MED ORDER — ONDANSETRON 8 MG PO TBDP
8.0000 mg | ORAL_TABLET | Freq: Once | ORAL | Status: AC
  Administered 2022-09-30: 8 mg via ORAL
  Filled 2022-09-30: qty 1

## 2022-09-30 MED ORDER — ONDANSETRON HCL 4 MG PO TABS
4.0000 mg | ORAL_TABLET | Freq: Four times a day (QID) | ORAL | Status: DC | PRN
  Administered 2022-10-03: 4 mg via ORAL
  Filled 2022-09-30: qty 1

## 2022-09-30 MED ORDER — FENTANYL CITRATE PF 50 MCG/ML IJ SOSY
75.0000 ug | PREFILLED_SYRINGE | INTRAMUSCULAR | Status: DC | PRN
  Administered 2022-10-01 – 2022-10-04 (×18): 75 ug via INTRAVENOUS
  Filled 2022-09-30 (×18): qty 2

## 2022-09-30 MED ORDER — HYDRALAZINE HCL 20 MG/ML IJ SOLN: 5.0000 mg | INTRAMUSCULAR | Status: DC | PRN

## 2022-09-30 MED ORDER — FENTANYL CITRATE PF 50 MCG/ML IJ SOSY
50.0000 ug | PREFILLED_SYRINGE | Freq: Once | INTRAMUSCULAR | Status: AC
  Administered 2022-09-30: 50 ug via INTRAMUSCULAR

## 2022-09-30 NOTE — ED Provider Triage Note (Signed)
Emergency Medicine Provider Triage Evaluation Note  Linda Blackburn , a 72 y.o. female  was evaluated in triage.  Pt complains of epigastric pain.  Patient reports he has a prior history of chronic pancreatitis with occasional flareups.  She reports that she has also had associated nausea, vomiting, diarrhea, chills.  Patient reports that she typically has to come to emergency department for IV fluids and pain management as she is able to manage the symptoms at home by herself.  Patient reports she is taking 12 mg of Zofran today with no relief in nausea symptoms..  Review of Systems  Positive: As above Negative: As above  Physical Exam  BP 129/64   Pulse 74   Temp 97.9 F (36.6 C) (Oral)   Resp 16   Ht '5\' 1"'$  (1.549 m)   Wt 99.8 kg   SpO2 98%   BMI 41.57 kg/m  Gen:   Awake, no distress   Resp:  Normal effort  MSK:   Moves extremities without difficulty  Other:  Significant epigastric tenderness  Medical Decision Making  Medically screening exam initiated at 2:51 PM.  Appropriate orders placed.  Fatiha Guzy was informed that the remainder of the evaluation will be completed by another provider, this initial triage assessment does not replace that evaluation, and the importance of remaining in the ED until their evaluation is complete.     Luvenia Heller, PA-C 09/30/22 1452

## 2022-09-30 NOTE — H&P (Signed)
PCP:   [name], MD   Chief Complaint:  Pancreatitis  HPI: This is a pleasant 72 year old female with past medical history of for recurrent idiopathic pancreatitis for over 25 years, hypertension, anxiety and depression.  This a.m. she woke with her stomach feeling crampy.  As the day wore on her abdominal pain became painful.  She was having episodes where she became completely diaphoretic from head to toe, clammy.  She went on to have nausea vomiting.  She initially tried 10 L home with home pain medication which was not helpful.  At some point her left breast and left arm became sporadically, this scared her.  As her abdominal pain which was located mostly in the epigastric and left upper quadrant area kept worsening.  She called 911 came to the ER.  In the ER patient's lipase was 3, 726.  Her CAT scan was consistent with pancreatitis.  Admission requested.  Review of Systems:  The patient denies anorexia, fever, weight loss,, vision loss, decreased hearing, hoarseness, chest pain, syncope, dyspnea on exertion, peripheral edema, balance deficits, hemoptysis, abdominal pain, melena, hematochezia, severe indigestion/heartburn, hematuria, incontinence, genital sores, muscle weakness, suspicious skin lesions, transient blindness, difficulty walking, unusual weight change, abnormal bleeding, enlarged lymph nodes, angioedema, and breast masses. Positive: Abdominal pain, nausea, vomiting, diaphoresis, left chest pain, left arm numbness  Past Medical History: Past Medical History:  Diagnosis Date   Abnormal uterine bleeding    Amenorrhea    Anxiety    Depression    Gallstones    Hay fever    HTN (hypertension)    Pancreatitis    Urine incontinence    UTI (urinary tract infection)    reoccuring    Past Surgical History:  Procedure Laterality Date   ABDOMINAL HYSTERECTOMY  1984   fibroids   APPENDECTOMY  1984   BILIARY DILATION  05/29/2019   Procedure: BILIARY DILATION;   Surgeon: Irving Copas., MD;  Location: [facility];  Service: Gastroenterology;;   BIOPSY  05/29/2019   Procedure: BIOPSY;  Surgeon: Irving Copas., MD;  Location: [facility];  Service: Gastroenterology;;   CATARACT EXTRACTION Right 11/22/2021   CHOLECYSTECTOMY  09/08/1999   ERCP N/A 05/29/2019   Procedure: ENDOSCOPIC RETROGRADE CHOLANGIOPANCREATOGRAPHY (ERCP);  Surgeon: Irving Copas., MD;  Location: [facility];  Service: Gastroenterology;  Laterality: N/A;   ERCP W/ SPHINCTEROTOMY AND BALLOON DILATION  08/2008   ESOPHAGOGASTRODUODENOSCOPY (EGD) WITH PROPOFOL N/A 05/29/2019   Procedure: ESOPHAGOGASTRODUODENOSCOPY (EGD) WITH PROPOFOL;  Surgeon: Rush Landmark Telford Nab., MD;  Location: [facility];  Service: Gastroenterology;  Laterality: N/A;   OOPHORECTOMY     Only 1 was taken   POLYPECTOMY  05/29/2019   Procedure: POLYPECTOMY;  Surgeon: Mansouraty, Telford Nab., MD;  Location: [facility];  Service: Gastroenterology;;   REMOVAL OF STONES  05/29/2019   Procedure: REMOVAL OF STONES;  Surgeon: Irving Copas., MD;  Location: [facility];  Service: Gastroenterology;;   TONSILLECTOMY     TUBAL LIGATION      Medications: Prior to Admission medications   Medication Sig Start Date End Date Taking? Authorizing Provider  fluconazole (DIFLUCAN) 100 MG tablet Take 1 tablet (100 mg total) by mouth daily as needed. 08/22/22   Burchette, Alinda Sierras, MD  losartan (COZAAR) 100 MG tablet Take 1 tablet (100 mg total) by mouth daily. 08/22/22   Burchette, Alinda Sierras, MD  ondansetron (ZOFRAN-ODT) 8 MG disintegrating tablet Take 1 tablet (8 mg total) by mouth every 8 (eight) hours as needed for nausea  or vomiting. 08/22/22   Burchette, Alinda Sierras, MD  pantoprazole (PROTONIX) 40 MG tablet Take 1 tablet (40 mg total) by mouth daily. 08/22/22   Burchette, Alinda Sierras, MD  sertraline (ZOLOFT) 100 MG tablet TAKE ONE TABLET BY MOUTH EVERY DAY 08/22/22   Burchette, Alinda Sierras, MD  Vibegron  75 MG TABS  04/13/21   [provider]  zolpidem (AMBIEN) 5 MG tablet Take 1 tablet (5 mg total) by mouth at bedtime as needed for sleep. 08/22/22   Burchette, Alinda Sierras, MD    Allergies:   Allergies  Allergen Reactions   Other Hives and Swelling    AQUASONIC [place] GEL   Buprenorphine Hcl     "In a scarry movie" weird thoughts   Codeine     GI upset   Dilaudid [Hydromorphone Hcl] Itching   Morphine And Related     "In a scarry movie" weird thoughts    Social History:  reports that she has never smoked. She has never used smokeless tobacco. She reports that she does not currently use alcohol. She reports that she does not use drugs.  Family History: Family History  Problem Relation Age of Onset   Alcohol abuse Mother    Prostate cancer Father    Stroke Maternal Aunt    Hiatal hernia Maternal Aunt    Prostate cancer Paternal Grandfather    Cerebral palsy Granddaughter    Breast cancer Neg Hx     Physical Exam: Vitals:   09/30/22 1800 09/30/22 1809 09/30/22 1815 09/30/22 1925  BP: 124/62 124/62 100/61   Pulse: 71 68 66 77  Resp: 18  18   Temp:  97.7 F (36.5 C)    TempSrc:  Oral    SpO2: 98% 98% 97% 96%  Weight:      Height:        General:  Alert and oriented times three, well developed and nourished, no acute distress Eyes: PERRLA, pink conjunctiva, no scleral icterus ENT: Moist oral mucosa, neck supple, no thyromegaly Lungs: clear to ascultation, no wheeze, no crackles, no use of accessory muscles Cardiovascular: regular rate and rhythm, no regurgitation, no gallops, no murmurs. No carotid bruits, no JVD Abdomen: soft, positive BS, non-distended, no organomegaly, not an acute abdomen.  Positive generalized TTP greatest in the epigastric and LUQ GU: not examined Neuro: CN II - XII grossly intact, sensation intact Musculoskeletal: strength 5/5 all extremities, no clubbing, cyanosis or edema Skin: no rash, no subcutaneous crepitation, no decubitus Psych:  appropriate patient   Labs on Admission:  Recent Labs    09/30/22 1538  NA 137  K 4.2  CL 108  CO2 22  GLUCOSE 127*  BUN 17  CREATININE 0.89  CALCIUM 9.0   Recent Labs    09/30/22 1538  AST 34  ALT 32  ALKPHOS 73  BILITOT 0.9  PROT 7.0  ALBUMIN 4.1   Recent Labs    09/30/22 1538  LIPASE 3,726*   Recent Labs    09/30/22 1538  WBC 10.8*  NEUTROABS 8.8*  HGB 15.5*  HCT 48.3*  MCV 90.1  PLT 239    Radiological Exams on Admission: CT ABDOMEN PELVIS WO CONTRAST  Result Date: 09/30/2022 CLINICAL DATA:  Abdominal pain. Technologist notes state chronic pancreatitis. EXAM: CT ABDOMEN AND PELVIS WITHOUT CONTRAST TECHNIQUE: Multidetector CT imaging of the abdomen and pelvis was performed following the standard protocol without IV contrast. RADIATION DOSE REDUCTION: This exam was performed according to the departmental dose-optimization program which includes  automated exposure control, adjustment of the mA and/or kV according to patient size and/or use of iterative reconstruction technique. COMPARISON:  Contrast enhanced CT 01/21/2020 FINDINGS: Lower chest: Mild left basilar atelectasis adjacent to elevated hemidiaphragm. No significant pleural effusion. Hepatobiliary: Heterogeneous hepatic steatosis. Chronic pneumobilia. No evidence of focal hepatic lesion. Cholecystectomy without biliary dilatation. Pancreas: Moderate peripancreatic fat stranding about the entire pancreas, but most prominent proximally. No acute peripancreatic collection. No pancreatic ductal dilatation. No pancreatic air. No evidence of pancreatic mass on this unenhanced exam. Spleen: Normal in size without focal abnormality. Adrenals/Urinary Tract: Normal adrenal glands. No hydronephrosis or renal calculi. Small amount of left perinephric edema is likely reactive related to pancreatic inflammation. Partially distended urinary bladder, normal for degree of distension. Stomach/Bowel: Fat stranding about the stomach  is likely tracking from pancreatic inflammation. There is wall thickening of the duodenum in the region of pancreatic fat stranding. No small bowel obstruction or inflammation. Multifocal colonic diverticulosis, most prominently affecting the descending colon. No acute diverticulitis. The appendix is not definitively seen. Vascular/Lymphatic: Mild aorto bi-iliac atherosclerosis. No aneurysm. Circumaortic left renal vein. Multiple small peripancreatic nodes, not enlarged by size criteria. There also small central mesenteric and retroperitoneal nodes. Reproductive: Status post hysterectomy. No adnexal masses. Other: Moderate peripancreatic edema. Small amount of non organized free fluid tracks into the left pericolic gutter. No free air. No focal fluid collection. Small fat containing umbilical hernia. Musculoskeletal: Mild diffuse degenerative change in the lumbar spine. Mild degenerative change of the hips. There are no acute or suspicious osseous abnormalities. IMPRESSION: 1. Acute edematous pancreatitis. No acute peripancreatic collection. 2. Heterogeneous hepatic steatosis. Chronic pneumobilia. 3. Colonic diverticulosis without acute inflammation. Aortic Atherosclerosis (ICD10-I70.0). Electronically Signed   By: Keith Rake M.D.   On: 09/30/2022 18:50    Assessment/Plan Present on Admission:  Acute on chronic pancreatitis ([place]) -Admit to MedSurg -N.p.o., IV fluid hydration -Pain meds as needed -Lipase in a.m. -IV Protonix   HTN (hypertension) -Stable, resume losartan -As needed blood pressure medications   Adjustment disorder with mixed anxiety and depressed mood -Resume Zoloft  Teisha Trowbridge 09/30/2022, 7:31 PM

## 2022-09-30 NOTE — ED Triage Notes (Signed)
Diffuse upper quadrant abd pain. Started today at [facility]. History of chronic pancreatitis. Typical flare ups consist of Radiation to back and left shoulder with intermittent numbness in left arm. Today patient reports pain has traveled to lower abd. Emesis x3 , last being two hours ago Took '12mg'$  zofran today with no effect.  VS WNL with EMS.  Patient rates pain in triage as 10/10. Says she took one pain pill at 0530 today with no relief.

## 2022-09-30 NOTE — ED Provider Notes (Signed)
[facility] Provider Note   CSN: [id number] Arrival date & time: 09/30/22  1415     History  Chief Complaint  Patient presents with   Abdominal Pain    Linda Blackburn is a 72 y.o. female.  Patient here with epigastric abdominal pain that started earlier this morning with nausea and vomiting.  History of pancreatitis and feels like the same.  Sounds like history of idiopathic pancreatitis.  She has had some procedures in the past but overall multiple specialist unable to help provide long-term relief for this.  She had her gallbladder removed in the past.  She gets symptoms every once in a while.  She denies any chest pain or shortness of breath or diarrhea.  Nothing makes it worse or better.  She did get Zofran in triage and feels like nausea has improved.  The history is provided by the patient.       Home Medications Prior to Admission medications   Medication Sig Start Date End Date Taking? Authorizing Provider  fluconazole (DIFLUCAN) 100 MG tablet Take 1 tablet (100 mg total) by mouth daily as needed. 08/22/22   Burchette, Alinda Sierras, MD  losartan (COZAAR) 100 MG tablet Take 1 tablet (100 mg total) by mouth daily. 08/22/22   Burchette, Alinda Sierras, MD  ondansetron (ZOFRAN-ODT) 8 MG disintegrating tablet Take 1 tablet (8 mg total) by mouth every 8 (eight) hours as needed for nausea or vomiting. 08/22/22   Burchette, Alinda Sierras, MD  pantoprazole (PROTONIX) 40 MG tablet Take 1 tablet (40 mg total) by mouth daily. 08/22/22   Burchette, Alinda Sierras, MD  sertraline (ZOLOFT) 100 MG tablet TAKE ONE TABLET BY MOUTH EVERY DAY 08/22/22   Burchette, Alinda Sierras, MD  Vibegron 75 MG TABS  04/13/21   [provider]  zolpidem (AMBIEN) 5 MG tablet Take 1 tablet (5 mg total) by mouth at bedtime as needed for sleep. 08/22/22   Burchette, Alinda Sierras, MD      Allergies    Other, Buprenorphine hcl, Codeine, Dilaudid [hydromorphone hcl], and Morphine and  related    Review of Systems   Review of Systems  Physical Exam Updated Vital Signs BP 124/62   Pulse 68   Temp 97.7 F (36.5 C) (Oral)   Resp 18   Ht '5\' 1"'$  (1.549 m)   Wt 99.8 kg   SpO2 98%   BMI 41.57 kg/m  Physical Exam Vitals and nursing note reviewed.  Constitutional:      General: She is not in acute distress.    Appearance: She is well-developed. She is not ill-appearing.  HENT:     Head: Normocephalic and atraumatic.     Mouth/Throat:     Mouth: Mucous membranes are moist.  Eyes:     Extraocular Movements: Extraocular movements intact.     Conjunctiva/sclera: Conjunctivae normal.     Pupils: Pupils are equal, round, and reactive to light.  Cardiovascular:     Rate and Rhythm: Normal rate and regular rhythm.     Heart sounds: Normal heart sounds. No murmur heard. Pulmonary:     Effort: Pulmonary effort is normal. No respiratory distress.     Breath sounds: Normal breath sounds.  Abdominal:     Palpations: Abdomen is soft.     Tenderness: There is abdominal tenderness in the epigastric area.  Musculoskeletal:        General: No swelling.     Cervical back: Neck supple.  Skin:  General: Skin is warm and dry.     Capillary Refill: Capillary refill takes less than 2 seconds.  Neurological:     Mental Status: She is alert.  Psychiatric:        Mood and Affect: Mood normal.     ED Results / Procedures / Treatments   Labs (all labs ordered are listed, but only abnormal results are displayed) Labs Reviewed  COMPREHENSIVE METABOLIC PANEL - Abnormal; Notable for the following components:      Result Value   Glucose, Bld 127 (*)    All other components within normal limits  CBC WITH DIFFERENTIAL/PLATELET - Abnormal; Notable for the following components:   WBC 10.8 (*)    RBC 5.36 (*)    Hemoglobin 15.5 (*)    HCT 48.3 (*)    Neutro Abs 8.8 (*)    All other components within normal limits  LIPASE, BLOOD - Abnormal; Notable for the following components:    Lipase 3,726 (*)    All other components within normal limits  URINALYSIS, ROUTINE W REFLEX MICROSCOPIC    EKG None  Radiology CT ABDOMEN PELVIS WO CONTRAST  Result Date: 09/30/2022 CLINICAL DATA:  Abdominal pain. Technologist notes state chronic pancreatitis. EXAM: CT ABDOMEN AND PELVIS WITHOUT CONTRAST TECHNIQUE: Multidetector CT imaging of the abdomen and pelvis was performed following the standard protocol without IV contrast. RADIATION DOSE REDUCTION: This exam was performed according to the departmental dose-optimization program which includes automated exposure control, adjustment of the mA and/or kV according to patient size and/or use of iterative reconstruction technique. COMPARISON:  Contrast enhanced CT 01/21/2020 FINDINGS: Lower chest: Mild left basilar atelectasis adjacent to elevated hemidiaphragm. No significant pleural effusion. Hepatobiliary: Heterogeneous hepatic steatosis. Chronic pneumobilia. No evidence of focal hepatic lesion. Cholecystectomy without biliary dilatation. Pancreas: Moderate peripancreatic fat stranding about the entire pancreas, but most prominent proximally. No acute peripancreatic collection. No pancreatic ductal dilatation. No pancreatic air. No evidence of pancreatic mass on this unenhanced exam. Spleen: Normal in size without focal abnormality. Adrenals/Urinary Tract: Normal adrenal glands. No hydronephrosis or renal calculi. Small amount of left perinephric edema is likely reactive related to pancreatic inflammation. Partially distended urinary bladder, normal for degree of distension. Stomach/Bowel: Fat stranding about the stomach is likely tracking from pancreatic inflammation. There is wall thickening of the duodenum in the region of pancreatic fat stranding. No small bowel obstruction or inflammation. Multifocal colonic diverticulosis, most prominently affecting the descending colon. No acute diverticulitis. The appendix is not definitively seen.  Vascular/Lymphatic: Mild aorto bi-iliac atherosclerosis. No aneurysm. Circumaortic left renal vein. Multiple small peripancreatic nodes, not enlarged by size criteria. There also small central mesenteric and retroperitoneal nodes. Reproductive: Status post hysterectomy. No adnexal masses. Other: Moderate peripancreatic edema. Small amount of non organized free fluid tracks into the left pericolic gutter. No free air. No focal fluid collection. Small fat containing umbilical hernia. Musculoskeletal: Mild diffuse degenerative change in the lumbar spine. Mild degenerative change of the hips. There are no acute or suspicious osseous abnormalities. IMPRESSION: 1. Acute edematous pancreatitis. No acute peripancreatic collection. 2. Heterogeneous hepatic steatosis. Chronic pneumobilia. 3. Colonic diverticulosis without acute inflammation. Aortic Atherosclerosis (ICD10-I70.0). Electronically Signed   By: Keith Rake M.D.   On: 09/30/2022 18:50    Procedures Procedures    Medications Ordered in ED Medications  sodium chloride 0.9 % bolus 1,000 mL (has no administration in time range)  0.9 %  sodium chloride infusion (has no administration in time range)  fentaNYL (SUBLIMAZE) injection 50 mcg (  50 mcg Intramuscular Not Given 09/30/22 1847)  ondansetron (ZOFRAN-ODT) disintegrating tablet 8 mg (8 mg Oral Given 09/30/22 1745)  fentaNYL (SUBLIMAZE) injection 50 mcg (50 mcg Intramuscular Given 09/30/22 1826)    ED Course/ Medical Decision Making/ A&P                             Medical Decision Making Amount and/or Complexity of Data Reviewed Labs: ordered. Radiology: ordered.  Risk Prescription drug management. Decision regarding hospitalization.   Lashawne Dura is here with abdominal pain.  History of cholecystectomy and pancreatitis.  Sounds like he has a history of idiopathic pancreatitis.  Does not use alcohol.  Has had her gallbladder removed in the past.  She has had multiple procedures  to evaluate her pancreatic been hepatobiliary duct.  Sounds like maybe there is some anatomic issue but overall these episodes usually self resolved.  She has no fever.  Normal vitals.  She is already had a CBC, CMP and lipase done prior to my evaluation.  Per my review interpretation labs labs are consistent with pancreatitis.  No major white count.  No major electrolyte abnormality or kidney injury.  Lipase is elevated to 3700.  CT scan of the abdomen pelvis was ordered that shows acute pancreatitis with no concerning features.  Overall she is given IV fluids, IV fentanyl and will admit for further pain control and bowel rest.  She does follow with Dr. Ardis Hughs of gastroenterology.  This chart was dictated using voice recognition software.  Despite best efforts to proofread,  errors can occur which can change the documentation meaning.         Final Clinical Impression(s) / ED Diagnoses Final diagnoses:  Acute pancreatitis, unspecified complication status, unspecified pancreatitis type    Rx / DC Orders ED Discharge Orders     None         Lennice Sites, DO 09/30/22 1901

## 2022-10-01 ENCOUNTER — Encounter (HOSPITAL_COMMUNITY): Payer: Self-pay | Admitting: Family Medicine

## 2022-10-01 DIAGNOSIS — F4323 Adjustment disorder with mixed anxiety and depressed mood: Secondary | ICD-10-CM | POA: Diagnosis not present

## 2022-10-01 DIAGNOSIS — I10 Essential (primary) hypertension: Secondary | ICD-10-CM | POA: Diagnosis not present

## 2022-10-01 DIAGNOSIS — K859 Acute pancreatitis without necrosis or infection, unspecified: Secondary | ICD-10-CM | POA: Diagnosis not present

## 2022-10-01 DIAGNOSIS — K861 Other chronic pancreatitis: Secondary | ICD-10-CM | POA: Diagnosis not present

## 2022-10-01 LAB — URINALYSIS, ROUTINE W REFLEX MICROSCOPIC
Bilirubin Urine: NEGATIVE
Glucose, UA: NEGATIVE mg/dL
Ketones, ur: NEGATIVE mg/dL
Nitrite: NEGATIVE
Protein, ur: NEGATIVE mg/dL
Specific Gravity, Urine: 1.019 (ref 1.005–1.030)
pH: 5 (ref 5.0–8.0)

## 2022-10-01 LAB — COMPREHENSIVE METABOLIC PANEL
ALT: 28 U/L (ref 0–44)
AST: 26 U/L (ref 15–41)
Albumin: 3.7 g/dL (ref 3.5–5.0)
Alkaline Phosphatase: 67 U/L (ref 38–126)
Anion gap: 7 (ref 5–15)
BUN: 15 mg/dL (ref 8–23)
CO2: 25 mmol/L (ref 22–32)
Calcium: 8.6 mg/dL — ABNORMAL LOW (ref 8.9–10.3)
Chloride: 107 mmol/L (ref 98–111)
Creatinine, Ser: 0.79 mg/dL (ref 0.44–1.00)
GFR, Estimated: >60 mL/min (ref >60)
Glucose, Bld: 103 mg/dL — ABNORMAL HIGH (ref 70–99)
Potassium: 3.6 mmol/L (ref 3.5–5.1)
Sodium: 139 mmol/L (ref 135–145)
Total Bilirubin: 1.1 mg/dL (ref 0.3–1.2)
Total Protein: 6.2 g/dL — ABNORMAL LOW (ref 6.5–8.1)

## 2022-10-01 LAB — CBC WITH DIFFERENTIAL/PLATELET
Abs Immature Granulocytes: 0.03 10*3/uL (ref 0.00–0.07)
Basophils Absolute: 0 10*3/uL (ref 0.0–0.1)
Basophils Relative: 0 %
Eosinophils Absolute: 0.1 10*3/uL (ref 0.0–0.5)
Eosinophils Relative: 1 %
HCT: 43.8 % (ref 36.0–46.0)
Hemoglobin: 14.1 g/dL (ref 12.0–15.0)
Immature Granulocytes: 0 %
Lymphocytes Relative: 27 %
Lymphs Abs: 2.3 10*3/uL (ref 0.7–4.0)
MCH: 29.4 pg (ref 26.0–34.0)
MCHC: 32.2 g/dL (ref 30.0–36.0)
MCV: 91.3 fL (ref 80.0–100.0)
Monocytes Absolute: 0.6 10*3/uL (ref 0.1–1.0)
Monocytes Relative: 7 %
Neutro Abs: 5.7 10*3/uL (ref 1.7–7.7)
Neutrophils Relative %: 65 %
Platelets: 212 10*3/uL (ref 150–400)
RBC: 4.8 MIL/uL (ref 3.87–5.11)
RDW: 13.3 % (ref 11.5–15.5)
WBC: 8.7 10*3/uL (ref 4.0–10.5)
nRBC: 0 % (ref 0.0–0.2)

## 2022-10-01 LAB — MAGNESIUM: Magnesium: 1.9 mg/dL (ref 1.7–2.4)

## 2022-10-01 LAB — TRIGLYCERIDES: Triglycerides: 67 mg/dL (ref <150)

## 2022-10-01 LAB — LIPASE, BLOOD: Lipase: 1282 U/L — ABNORMAL HIGH (ref 11–51)

## 2022-10-01 MED ORDER — MIRABEGRON ER 25 MG PO TB24
25.0000 mg | ORAL_TABLET | Freq: Every day | ORAL | Status: DC
  Administered 2022-10-01 – 2022-10-04 (×4): 25 mg via ORAL
  Filled 2022-10-01 (×4): qty 1

## 2022-10-01 MED ORDER — ZOLPIDEM TARTRATE 5 MG PO TABS
5.0000 mg | ORAL_TABLET | Freq: Every evening | ORAL | Status: DC | PRN
  Administered 2022-10-01 – 2022-10-03 (×3): 5 mg via ORAL
  Filled 2022-10-01 (×3): qty 1

## 2022-10-01 NOTE — Progress Notes (Signed)
[facility]                                                                              Sheccid Lahmann, is a 72 y.o. female, DOB - June 01, 1951, LKG:[id number] Admit date - 09/30/2022    Outpatient Primary MD for the patient is Burchette, Alinda Sierras, MD  LOS - 1  days  Chief Complaint  Patient presents with   Abdominal Pain       Brief summary   Patient is a 72 year old female with recurrent idiopathic pancreatitis for over 25 years, HTN, anxiety, presented with abdominal pain.  Patient reported that she woke up on the day of admission with crampy abdominal pain.  As a day progressed, her abdominal pain became worse.  Also had episodes of diaphoresis and feeling clammy, nausea and vomiting.  She tried drinking enough fluid with the pain medications however did not help.  Abdominal pain located mostly in epigastric and left upper quadrant area radiating to her back very similar to her previous episodes of pancreatitis.  In ED, lipase 3726.  CT scan consistent with acute pancreatitis.   Assessment & Plan    Principal Problem:   Acute on chronic recurrent idiopathic pancreatitis [facility]) -Patient has had extensive workup in the past.  History of cholecystectomy, does not drink alcohol. -CT abdomen showed acute edematous pancreatitis, no acute peri-pancreatic collection -Triglycerides 67 -Continue n.p.o., IVF Ringer lactate 125 cc an hour -Continue pain control, antiemetics   Active Problems:    HTN (hypertension) -resumed losartan, added hydralazine IV as needed with parameters     Adjustment disorder with mixed anxiety and depressed mood Continue Zoloft  GERD -Continue IV PPI   Obesity Estimated body mass index is 41.57 kg/m as calculated from the following:   Height as of this encounter: '5\' 1"'$  (1.549 m).   Weight as of this encounter: 99.8 kg.  Code Status: DNR DVT Prophylaxis:  enoxaparin (LOVENOX) injection 40 mg Start: 09/30/22 2200 SCDs Start:  09/30/22 1928   Level of Care: Level of care: Med-Surg Family Communication:  Disposition Plan:      Remains inpatient appropriate:    Procedures:  CT abdomen  Consultants:   None  Antimicrobials: None     Medications  enoxaparin (LOVENOX) injection  40 mg Subcutaneous Q24H   fentaNYL (SUBLIMAZE) injection  50 mcg Intramuscular Once   losartan  100 mg Oral Daily   mirabegron ER  25 mg Oral Daily   pantoprazole (PROTONIX) IV  40 mg Intravenous Daily   sertraline  100 mg Oral Daily      Subjective:   Linda Blackburn was seen and examined today.  Complaining of diffuse abdominal pain, no acute vomiting, fevers or chills.  Patient denies dizziness, chest pain, shortness of breath,  No acute events overnight.    Objective:   Vitals:   10/01/22 0600 10/01/22 0900 10/01/22 1000 10/01/22 1020  BP: (!) 118/57 (!) 130/57 (!) 154/79   Pulse: 71 68 74   Resp: '18 18 16   '$ Temp: 99.3 F (37.4 C)   98.3 F (36.8 C)  TempSrc: Oral   Oral  SpO2: 92% 91%  92%   Weight:      Height:       No intake or output data in the 24 hours ending 10/01/22 1053   Wt Readings from Last 3 Encounters:  09/30/22 99.8 kg  08/22/22 110.6 kg  11/28/21 108 kg     Exam General: Alert and oriented x 3, NAD Cardiovascular: S1 S2 auscultated,  RRR Respiratory: Clear to auscultation bilaterally, no wheezing Gastrointestinal: Soft, diffuse TTP, no rebound or guarding, ND, NBS Ext: no pedal edema bilaterally Neuro: Strength 5/5 upper and lower extremities bilaterally Skin: No rashes Psych: Normal affect and demeanor, alert and oriented x3     Data Reviewed:  I have personally reviewed following labs    CBC Lab Results  Component Value Date   WBC 8.7 10/01/2022   RBC 4.80 10/01/2022   HGB 14.1 10/01/2022   HCT 43.8 10/01/2022   MCV 91.3 10/01/2022   MCH 29.4 10/01/2022   PLT 212 10/01/2022   MCHC 32.2 10/01/2022   RDW 13.3 10/01/2022   LYMPHSABS 2.3 10/01/2022   MONOABS 0.6  10/01/2022   EOSABS 0.1 10/01/2022   BASOSABS 0.0 65/11/5463     Last metabolic panel Lab Results  Component Value Date   NA 139 10/01/2022   K 3.6 10/01/2022   CL 107 10/01/2022   CO2 25 10/01/2022   BUN 15 10/01/2022   CREATININE 0.79 10/01/2022   GLUCOSE 103 (H) 10/01/2022   GFRNONAA >60 10/01/2022   GFRAA >60 01/25/2020   CALCIUM 8.6 (L) 10/01/2022   PHOS 2.5 11/19/2018   PROT 6.2 (L) 10/01/2022   ALBUMIN 3.7 10/01/2022   BILITOT 1.1 10/01/2022   ALKPHOS 67 10/01/2022   AST 26 10/01/2022   ALT 28 10/01/2022   ANIONGAP 7 10/01/2022    CBG (last 3)  No results for input(s): "GLUCAP" in the last 72 hours.    Coagulation Profile: No results for input(s): "INR", "PROTIME" in the last 168 hours.   Radiology Studies: I have personally reviewed the imaging studies  CT ABDOMEN PELVIS WO CONTRAST  Result Date: 09/30/2022 CLINICAL DATA:  Abdominal pain. Technologist notes state chronic pancreatitis. EXAM: CT ABDOMEN AND PELVIS WITHOUT CONTRAST TECHNIQUE: Multidetector CT imaging of the abdomen and pelvis was performed following the standard protocol without IV contrast. RADIATION DOSE REDUCTION: This exam was performed according to the departmental dose-optimization program which includes automated exposure control, adjustment of the mA and/or kV according to patient size and/or use of iterative reconstruction technique. COMPARISON:  Contrast enhanced CT 01/21/2020 FINDINGS: Lower chest: Mild left basilar atelectasis adjacent to elevated hemidiaphragm. No significant pleural effusion. Hepatobiliary: Heterogeneous hepatic steatosis. Chronic pneumobilia. No evidence of focal hepatic lesion. Cholecystectomy without biliary dilatation. Pancreas: Moderate peripancreatic fat stranding about the entire pancreas, but most prominent proximally. No acute peripancreatic collection. No pancreatic ductal dilatation. No pancreatic air. No evidence of pancreatic mass on this unenhanced exam.  Spleen: Normal in size without focal abnormality. Adrenals/Urinary Tract: Normal adrenal glands. No hydronephrosis or renal calculi. Small amount of left perinephric edema is likely reactive related to pancreatic inflammation. Partially distended urinary bladder, normal for degree of distension. Stomach/Bowel: Fat stranding about the stomach is likely tracking from pancreatic inflammation. There is wall thickening of the duodenum in the region of pancreatic fat stranding. No small bowel obstruction or inflammation. Multifocal colonic diverticulosis, most prominently affecting the descending colon. No acute diverticulitis. The appendix is not definitively seen. Vascular/Lymphatic: Mild aorto bi-iliac atherosclerosis. No aneurysm. Circumaortic left renal vein. Multiple small peripancreatic  nodes, not enlarged by size criteria. There also small central mesenteric and retroperitoneal nodes. Reproductive: Status post hysterectomy. No adnexal masses. Other: Moderate peripancreatic edema. Small amount of non organized free fluid tracks into the left pericolic gutter. No free air. No focal fluid collection. Small fat containing umbilical hernia. Musculoskeletal: Mild diffuse degenerative change in the lumbar spine. Mild degenerative change of the hips. There are no acute or suspicious osseous abnormalities. IMPRESSION: 1. Acute edematous pancreatitis. No acute peripancreatic collection. 2. Heterogeneous hepatic steatosis. Chronic pneumobilia. 3. Colonic diverticulosis without acute inflammation. Aortic Atherosclerosis (ICD10-I70.0). Electronically Signed   By: Keith Rake M.D.   On: 09/30/2022 18:50       Nikoleta Dady M.D. [facility] 10/01/2022, 10:53 AM  Available via Epic secure chat 7am-7pm After 7 pm, please refer to night coverage provider listed on amion.

## 2022-10-01 NOTE — ED Notes (Signed)
Pt ambulated to restroom and urinated successfully.

## 2022-10-01 NOTE — ED Notes (Signed)
ED TO INPATIENT HANDOFF REPORT  ED Nurse Name and Phone #: Vassie Moselle  S Name/Age/Gender Linda Blackburn 72 y.o. female Room/Bed: WA17/WA17  Code Status   Code Status: DNR  Home/SNF/Other Home Patient oriented to: self, place, time, and situation Is this baseline? Yes   Triage Complete: Triage complete  Chief Complaint Acute on chronic pancreatitis ([place]) [K85.90, K86.1]  Triage Note Diffuse upper quadrant abd pain. Started today at [facility]. History of chronic pancreatitis. Typical flare ups consist of Radiation to back and left shoulder with intermittent numbness in left arm. Today patient reports pain has traveled to lower abd. Emesis x3 , last being two hours ago Took '12mg'$  zofran today with no effect.  VS WNL with EMS.  Patient rates pain in triage as 10/10. Says she took one pain pill at 0530 today with no relief.    Allergies Allergies  Allergen Reactions   Other Hives and Swelling    AQUASONIC [place] GEL   Buprenorphine Hcl    Codeine     GI upset   Dilaudid [Hydromorphone Hcl] Itching   Morphine And Related     Ants crawling on skin    Level of Care/Admitting Diagnosis ED Disposition     ED Disposition  Admit   Condition  --   Comment  Hospital Area: [facility] [[id number]]  Level of Care: Med-Surg [16]  May admit patient to [facility] or [facility] if equivalent level of care is available:: Yes  Covid Evaluation: Confirmed COVID Negative  Diagnosis: Acute on chronic pancreatitis [facility]) [[id number]]  Admitting Physician: [place], [place]  Attending Physician: Quintella Baton [[id number]]  Certification:: I certify this patient will need inpatient services for at least 2 midnights  Estimated Length of Stay: 2          B Medical/Surgery History Past Medical History:  Diagnosis Date   Abnormal uterine bleeding    Amenorrhea    Anxiety    Depression    Gallstones    Hay fever    HTN (hypertension)    Pancreatitis    Urine  incontinence    UTI (urinary tract infection)    reoccuring    Past Surgical History:  Procedure Laterality Date   ABDOMINAL HYSTERECTOMY  1984   fibroids   APPENDECTOMY  1984   BILIARY DILATION  05/29/2019   Procedure: BILIARY DILATION;  Surgeon: Irving Copas., MD;  Location: [facility];  Service: Gastroenterology;;   BIOPSY  05/29/2019   Procedure: BIOPSY;  Surgeon: Irving Copas., MD;  Location: [facility];  Service: Gastroenterology;;   CATARACT EXTRACTION Right 11/22/2021   CHOLECYSTECTOMY  09/08/1999   ERCP N/A 05/29/2019   Procedure: ENDOSCOPIC RETROGRADE CHOLANGIOPANCREATOGRAPHY (ERCP);  Surgeon: Irving Copas., MD;  Location: [facility];  Service: Gastroenterology;  Laterality: N/A;   ERCP W/ SPHINCTEROTOMY AND BALLOON DILATION  08/2008   ESOPHAGOGASTRODUODENOSCOPY (EGD) WITH PROPOFOL N/A 05/29/2019   Procedure: ESOPHAGOGASTRODUODENOSCOPY (EGD) WITH PROPOFOL;  Surgeon: Rush Landmark Telford Nab., MD;  Location: [facility];  Service: Gastroenterology;  Laterality: N/A;   OOPHORECTOMY     Only 1 was taken   POLYPECTOMY  05/29/2019   Procedure: POLYPECTOMY;  Surgeon: Mansouraty, Telford Nab., MD;  Location: [facility];  Service: Gastroenterology;;   REMOVAL OF STONES  05/29/2019   Procedure: REMOVAL OF STONES;  Surgeon: Irving Copas., MD;  Location: [facility];  Service: Gastroenterology;;   TONSILLECTOMY     TUBAL LIGATION       A IV Location/Drains/Wounds  Patient Lines/Drains/Airways Status     Active Line/Drains/Airways     Name Placement date Placement time Site Days   Peripheral IV 09/30/22 20 G 1" Left Antecubital 09/30/22  1856  Antecubital  1   External Urinary Catheter 09/30/22  1900  --  1            Intake/Output Last 24 hours No intake or output data in the 24 hours ending 10/01/22 1407  Labs/Imaging Results for orders placed or performed during the hospital encounter of 09/30/22 (from the past 48  hour(s))  Urinalysis, Routine w reflex microscopic -Urine, Clean Catch     Status: Abnormal   Collection Time: 09/30/22  4:30 AM  Result Value Ref Range   Color, Urine YELLOW YELLOW   APPearance CLEAR CLEAR   Specific Gravity, Urine 1.019 1.005 - 1.030   pH 5.0 5.0 - 8.0   Glucose, UA NEGATIVE NEGATIVE mg/dL   Hgb urine dipstick SMALL (A) NEGATIVE   Bilirubin Urine NEGATIVE NEGATIVE   Ketones, ur NEGATIVE NEGATIVE mg/dL   Protein, ur NEGATIVE NEGATIVE mg/dL   Nitrite NEGATIVE NEGATIVE   Leukocytes,Ua TRACE (A) NEGATIVE   RBC / HPF 0-5 0 - 5 RBC/hpf   WBC, UA 0-5 0 - 5 WBC/hpf   Bacteria, UA RARE (A) NONE SEEN   Squamous Epithelial / HPF 0-5 0 - 5 /HPF   Mucus PRESENT     Comment: Performed at [facility], [place] [address], [place], [place] [address]  Comprehensive metabolic panel     Status: Abnormal   Collection Time: 09/30/22  3:38 PM  Result Value Ref Range   Sodium 137 135 - 145 mmol/L   Potassium 4.2 3.5 - 5.1 mmol/L   Chloride 108 98 - 111 mmol/L   CO2 22 22 - 32 mmol/L   Glucose, Bld 127 (H) 70 - 99 mg/dL    Comment: Glucose reference range applies only to samples taken after fasting for at least 8 hours.   BUN 17 8 - 23 mg/dL   Creatinine, Ser 0.89 0.44 - 1.00 mg/dL   Calcium 9.0 8.9 - 10.3 mg/dL   Total Protein 7.0 6.5 - 8.1 g/dL   Albumin 4.1 3.5 - 5.0 g/dL   AST 34 15 - 41 U/L   ALT 32 0 - 44 U/L   Alkaline Phosphatase 73 38 - 126 U/L   Total Bilirubin 0.9 0.3 - 1.2 mg/dL   GFR, Estimated >60 >60 mL/min    Comment: (NOTE) Calculated using the CKD-EPI Creatinine Equation (2021)    Anion gap 7 5 - 15    Comment: Performed at [facility], [place] [address] [place],  [address]  CBC with Differential     Status: Abnormal   Collection Time: 09/30/22  3:38 PM  Result Value Ref Range   WBC 10.8 (H) 4.0 - 10.5 K/uL   RBC 5.36 (H) 3.87 - 5.11 MIL/uL   Hemoglobin 15.5 (H) 12.0 - 15.0 g/dL   HCT 48.3 (H) 36.0 - 46.0 %   MCV  90.1 80.0 - 100.0 fL   MCH 28.9 26.0 - 34.0 pg   MCHC 32.1 30.0 - 36.0 g/dL   RDW 13.2 11.5 - 15.5 %   Platelets 239 150 - 400 K/uL   nRBC 0.0 0.0 - 0.2 %   Neutrophils Relative % 80 %   Neutro Abs 8.8 (H) 1.7 - 7.7 K/uL   Lymphocytes Relative 15 %   Lymphs Abs 1.6 0.7 -  4.0 K/uL   Monocytes Relative 4 %   Monocytes Absolute 0.4 0.1 - 1.0 K/uL   Eosinophils Relative 0 %   Eosinophils Absolute 0.0 0.0 - 0.5 K/uL   Basophils Relative 0 %   Basophils Absolute 0.0 0.0 - 0.1 K/uL   Immature Granulocytes 1 %   Abs Immature Granulocytes 0.05 0.00 - 0.07 K/uL    Comment: Performed at [facility], [place] [address], [place], [place] [address]  Lipase, blood     Status: Abnormal   Collection Time: 09/30/22  3:38 PM  Result Value Ref Range   Lipase 3,726 (H) 11 - 51 U/L    Comment: RESULT CONFIRMED BY MANUAL DILUTION Performed at [facility], [place] [address] [place], [place] [address]   Creatinine, serum     Status: None   Collection Time: 09/30/22  8:05 PM  Result Value Ref Range   Creatinine, Ser 0.78 0.44 - 1.00 mg/dL   GFR, Estimated >60 >60 mL/min    Comment: (NOTE) Calculated using the CKD-EPI Creatinine Equation (2021) Performed at [facility], [place] [address], [place], [place] [address]   Comprehensive metabolic panel     Status: Abnormal   Collection Time: 10/01/22  4:25 AM  Result Value Ref Range   Sodium 139 135 - 145 mmol/L   Potassium 3.6 3.5 - 5.1 mmol/L   Chloride 107 98 - 111 mmol/L   CO2 25 22 - 32 mmol/L   Glucose, Bld 103 (H) 70 - 99 mg/dL    Comment: Glucose reference range applies only to samples taken after fasting for at least 8 hours.   BUN 15 8 - 23 mg/dL   Creatinine, Ser 0.79 0.44 - 1.00 mg/dL   Calcium 8.6 (L) 8.9 - 10.3 mg/dL   Total Protein 6.2 (L) 6.5 - 8.1 g/dL   Albumin 3.7 3.5 - 5.0 g/dL   AST 26 15 - 41 U/L   ALT 28 0 - 44 U/L   Alkaline Phosphatase 67 38 - 126 U/L   Total Bilirubin  1.1 0.3 - 1.2 mg/dL   GFR, Estimated >60 >60 mL/min    Comment: (NOTE) Calculated using the CKD-EPI Creatinine Equation (2021)    Anion gap 7 5 - 15    Comment: Performed at [facility], [place] [address], [place], [place] [address]  Magnesium     Status: None   Collection Time: 10/01/22  4:25 AM  Result Value Ref Range   Magnesium 1.9 1.7 - 2.4 mg/dL    Comment: Performed at [facility], [place] [address] [place], [place] [address]  CBC with Differential/Platelet     Status: None   Collection Time: 10/01/22  4:25 AM  Result Value Ref Range   WBC 8.7 4.0 - 10.5 K/uL   RBC 4.80 3.87 - 5.11 MIL/uL   Hemoglobin 14.1 12.0 - 15.0 g/dL   HCT 43.8 36.0 - 46.0 %   MCV 91.3 80.0 - 100.0 fL   MCH 29.4 26.0 - 34.0 pg   MCHC 32.2 30.0 - 36.0 g/dL   RDW 13.3 11.5 - 15.5 %   Platelets 212 150 - 400 K/uL   nRBC 0.0 0.0 - 0.2 %   Neutrophils Relative % 65 %   Neutro Abs 5.7 1.7 - 7.7 K/uL   Lymphocytes Relative 27 %   Lymphs Abs 2.3 0.7 - 4.0 K/uL   Monocytes Relative 7 %   Monocytes Absolute 0.6 0.1 - 1.0  K/uL   Eosinophils Relative 1 %   Eosinophils Absolute 0.1 0.0 - 0.5 K/uL   Basophils Relative 0 %   Basophils Absolute 0.0 0.0 - 0.1 K/uL   Immature Granulocytes 0 %   Abs Immature Granulocytes 0.03 0.00 - 0.07 K/uL    Comment: Performed at [facility], [place] [address] [place], [place] [address]  Lipase, blood     Status: Abnormal   Collection Time: 10/01/22  4:25 AM  Result Value Ref Range   Lipase 1,282 (H) 11 - 51 U/L    Comment: Performed at [facility], [place] [address], [place], [place] [address]  Triglycerides     Status: None   Collection Time: 10/01/22  4:25 AM  Result Value Ref Range   Triglycerides 67 <150 mg/dL    Comment: Performed at [facility], [place] [address] [place], [place] [address]   CT ABDOMEN PELVIS WO CONTRAST  Result Date: 09/30/2022 CLINICAL DATA:  Abdominal  pain. Technologist notes state chronic pancreatitis. EXAM: CT ABDOMEN AND PELVIS WITHOUT CONTRAST TECHNIQUE: Multidetector CT imaging of the abdomen and pelvis was performed following the standard protocol without IV contrast. RADIATION DOSE REDUCTION: This exam was performed according to the departmental dose-optimization program which includes automated exposure control, adjustment of the mA and/or kV according to patient size and/or use of iterative reconstruction technique. COMPARISON:  Contrast enhanced CT 01/21/2020 FINDINGS: Lower chest: Mild left basilar atelectasis adjacent to elevated hemidiaphragm. No significant pleural effusion. Hepatobiliary: Heterogeneous hepatic steatosis. Chronic pneumobilia. No evidence of focal hepatic lesion. Cholecystectomy without biliary dilatation. Pancreas: Moderate peripancreatic fat stranding about the entire pancreas, but most prominent proximally. No acute peripancreatic collection. No pancreatic ductal dilatation. No pancreatic air. No evidence of pancreatic mass on this unenhanced exam. Spleen: Normal in size without focal abnormality. Adrenals/Urinary Tract: Normal adrenal glands. No hydronephrosis or renal calculi. Small amount of left perinephric edema is likely reactive related to pancreatic inflammation. Partially distended urinary bladder, normal for degree of distension. Stomach/Bowel: Fat stranding about the stomach is likely tracking from pancreatic inflammation. There is wall thickening of the duodenum in the region of pancreatic fat stranding. No small bowel obstruction or inflammation. Multifocal colonic diverticulosis, most prominently affecting the descending colon. No acute diverticulitis. The appendix is not definitively seen. Vascular/Lymphatic: Mild aorto bi-iliac atherosclerosis. No aneurysm. Circumaortic left renal vein. Multiple small peripancreatic nodes, not enlarged by size criteria. There also small central mesenteric and retroperitoneal nodes.  Reproductive: Status post hysterectomy. No adnexal masses. Other: Moderate peripancreatic edema. Small amount of non organized free fluid tracks into the left pericolic gutter. No free air. No focal fluid collection. Small fat containing umbilical hernia. Musculoskeletal: Mild diffuse degenerative change in the lumbar spine. Mild degenerative change of the hips. There are no acute or suspicious osseous abnormalities. IMPRESSION: 1. Acute edematous pancreatitis. No acute peripancreatic collection. 2. Heterogeneous hepatic steatosis. Chronic pneumobilia. 3. Colonic diverticulosis without acute inflammation. Aortic Atherosclerosis (ICD10-I70.0). Electronically Signed   By: Keith Rake M.D.   On: 09/30/2022 18:50    Pending Labs Unresulted Labs (From admission, onward)     Start     Ordered   10/07/22 0500  Creatinine, serum  (enoxaparin (LOVENOX)    CrCl >/= 30 ml/min)  Weekly,   R     Comments: while on enoxaparin therapy    09/30/22 1929   10/02/22 5462  Basic metabolic panel  Daily,   R      10/01/22 0636  10/02/22 0500  Lipase, blood  Daily,   R      10/01/22 0636   10/02/22 0500  CBC  Tomorrow morning,   R        10/01/22 0636            Vitals/Pain Today's Vitals   10/01/22 1250 10/01/22 1300 10/01/22 1305 10/01/22 1326  BP:  93/70 93/70   Pulse:  70 69   Resp:  18 18   Temp:      TempSrc:      SpO2:  96% 97%   Weight:      Height:      PainSc: 6    6     Isolation Precautions No active isolations  Medications Medications  fentaNYL (SUBLIMAZE) injection 50 mcg (50 mcg Intramuscular Not Given 09/30/22 1847)  enoxaparin (LOVENOX) injection 40 mg (40 mg Subcutaneous Given 09/30/22 2141)  acetaminophen (TYLENOL) tablet 650 mg (650 mg Oral Given 10/01/22 1251)    Or  acetaminophen (TYLENOL) suppository 650 mg ( Rectal See Alternative 10/01/22 1251)  senna-docusate (Senokot-S) tablet 1 tablet (has no administration in time range)  ondansetron (ZOFRAN) tablet 4 mg ( Oral  See Alternative 10/01/22 1251)    Or  ondansetron (ZOFRAN) injection 4 mg (4 mg Intravenous Given 10/01/22 1251)  lactated ringers infusion ( Intravenous New Bag/Given 10/01/22 1257)  losartan (COZAAR) tablet 100 mg (100 mg Oral Given 10/01/22 0919)  sertraline (ZOLOFT) tablet 100 mg (100 mg Oral Given 10/01/22 0919)  hydrALAZINE (APRESOLINE) injection 5 mg (has no administration in time range)  pantoprazole (PROTONIX) injection 40 mg (40 mg Intravenous Given 10/01/22 0919)  fentaNYL (SUBLIMAZE) injection 75 mcg (75 mcg Intravenous Given 10/01/22 1132)  zolpidem (AMBIEN) tablet 5 mg (has no administration in time range)  mirabegron ER (MYRBETRIQ) tablet 25 mg (25 mg Oral Given 10/01/22 1132)  ondansetron (ZOFRAN-ODT) disintegrating tablet 8 mg (8 mg Oral Given 09/30/22 1745)  sodium chloride 0.9 % bolus 1,000 mL (0 mLs Intravenous Stopped 09/30/22 2025)  0.9 %  sodium chloride infusion (0 mLs Intravenous Stopped 09/30/22 2025)  fentaNYL (SUBLIMAZE) injection 50 mcg (50 mcg Intramuscular Given 09/30/22 1826)    Mobility walks     Focused Assessments Cardiac Assessment Handoff:    Lab Results  Component Value Date   CKTOTAL 38 01/16/2018   TROPONINI <0.03 08/08/2018   No results found for: "DDIMER" Does the Patient currently have chest pain? No    R Recommendations: See Admitting Provider Note  Report given to:   Additional Notes:

## 2022-10-01 NOTE — ED Notes (Signed)
250m of urine detected with Bladder scan

## 2022-10-02 ENCOUNTER — Inpatient Hospital Stay (HOSPITAL_COMMUNITY): Payer: Medicare Other

## 2022-10-02 DIAGNOSIS — K861 Other chronic pancreatitis: Secondary | ICD-10-CM | POA: Diagnosis not present

## 2022-10-02 DIAGNOSIS — F4323 Adjustment disorder with mixed anxiety and depressed mood: Secondary | ICD-10-CM | POA: Diagnosis not present

## 2022-10-02 DIAGNOSIS — I10 Essential (primary) hypertension: Secondary | ICD-10-CM | POA: Diagnosis not present

## 2022-10-02 DIAGNOSIS — K859 Acute pancreatitis without necrosis or infection, unspecified: Secondary | ICD-10-CM | POA: Diagnosis not present

## 2022-10-02 LAB — GLUCOSE, CAPILLARY
Glucose-Capillary: 73 mg/dL (ref 70–99)
Glucose-Capillary: 61 mg/dL — ABNORMAL LOW (ref 70–99)
Glucose-Capillary: 97 mg/dL (ref 70–99)

## 2022-10-02 LAB — BASIC METABOLIC PANEL
Anion gap: 10 (ref 5–15)
BUN: 11 mg/dL (ref 8–23)
CO2: 19 mmol/L — ABNORMAL LOW (ref 22–32)
Calcium: 8.1 mg/dL — ABNORMAL LOW (ref 8.9–10.3)
Chloride: 106 mmol/L (ref 98–111)
Creatinine, Ser: 0.77 mg/dL (ref 0.44–1.00)
GFR, Estimated: >60 mL/min (ref >60)
Glucose, Bld: 77 mg/dL (ref 70–99)
Potassium: 3.6 mmol/L (ref 3.5–5.1)
Sodium: 135 mmol/L (ref 135–145)

## 2022-10-02 LAB — CBC
HCT: 41.5 % (ref 36.0–46.0)
Hemoglobin: 12.7 g/dL (ref 12.0–15.0)
MCH: 29.2 pg (ref 26.0–34.0)
MCHC: 30.6 g/dL (ref 30.0–36.0)
MCV: 95.4 fL (ref 80.0–100.0)
Platelets: 179 10*3/uL (ref 150–400)
RBC: 4.35 MIL/uL (ref 3.87–5.11)
RDW: 13.6 % (ref 11.5–15.5)
WBC: 8.9 10*3/uL (ref 4.0–10.5)
nRBC: 0 % (ref 0.0–0.2)

## 2022-10-02 LAB — BRAIN NATRIURETIC PEPTIDE: B Natriuretic Peptide: 55.5 pg/mL (ref 0.0–100.0)

## 2022-10-02 LAB — LIPASE, BLOOD: Lipase: 253 U/L — ABNORMAL HIGH (ref 11–51)

## 2022-10-02 MED ORDER — DEXTROSE-NACL 5-0.45 % IV SOLN: INTRAVENOUS | Status: DC

## 2022-10-02 NOTE — Progress Notes (Signed)
[facility]                                                                              [name], is a 72 y.o. female, DOB - 1951-02-21, HYI:[id number] Admit date - 09/30/2022    Outpatient Primary MD for the patient is Burchette, Linda Sierras, MD  LOS - 1  days  Chief Complaint  Patient presents with   Abdominal Pain       Brief summary   Patient is a 72 year old female with recurrent idiopathic pancreatitis for over 25 years, HTN, anxiety, presented with abdominal pain.  Patient reported that she woke up on the day of admission with crampy abdominal pain.  As a day progressed, her abdominal pain became worse.  Also had episodes of diaphoresis and feeling clammy, nausea and vomiting.  She tried drinking enough fluid with the pain medications however did not help.  Abdominal pain located mostly in epigastric and left upper quadrant area radiating to her back very similar to her previous episodes of pancreatitis.  In ED, lipase 3726.  CT scan consistent with acute pancreatitis.   Assessment & Plan    Principal Problem:   Acute on chronic recurrent idiopathic pancreatitis [facility]) -Patient has had extensive workup in the past.  History of cholecystectomy, does not drink alcohol. -CT abdomen showed acute edematous pancreatitis, no acute peri-pancreatic collection -Triglycerides 67 -Slowly improving, lipase 3726 on admission, improving to 253 today -Pain is improving however patient wants to continue n.p.o., IV fluids today and start clears tomorrow.   Active Problems:    HTN (hypertension) -BP stable, continue losartan  -Continue IV hydralazine as needed with parameters     Adjustment disorder with mixed anxiety and depressed mood Continue Zoloft  GERD -Continue IV PPI   Obesity Estimated body mass index is 41.57 kg/m as calculated from the following:   Height as of this encounter: '5\' 1"'$  (1.549 m).   Weight as of this encounter: 99.8 kg.  Code Status:  DNR DVT Prophylaxis:  enoxaparin (LOVENOX) injection 40 mg Start: 09/30/22 2200 SCDs Start: 09/30/22 1928   Level of Care: Level of care: Med-Surg Family Communication:  Disposition Plan:      Remains inpatient appropriate: Hopefully DC in next few days once tolerating solid diet.  Procedures:  CT abdomen  Consultants:   None  Antimicrobials: None     Medications  enoxaparin (LOVENOX) injection  40 mg Subcutaneous Q24H   fentaNYL (SUBLIMAZE) injection  50 mcg Intramuscular Once   losartan  100 mg Oral Daily   mirabegron ER  25 mg Oral Daily   pantoprazole (PROTONIX) IV  40 mg Intravenous Daily   sertraline  100 mg Oral Daily      Subjective:   Linda Blackburn was seen and examined today.  Overall starting to feel better, still having back pain.  Denies any acute nausea vomiting, dizziness, chest pain.  Does not want to try clears today.     Objective:   Vitals:   10/01/22 0600 10/01/22 0900 10/01/22 1000 10/01/22 1020  BP: (!) 118/57 (!) 130/57 (!) 154/79   Pulse: 71 68 74   Resp:  $'18 18 16   'c$ Temp: 99.3 F (37.4 C)   98.3 F (36.8 C)  TempSrc: Oral   Oral  SpO2: 92% 91% 92%   Weight:      Height:       No intake or output data in the 24 hours ending 10/01/22 1053   Wt Readings from Last 3 Encounters:  09/30/22 99.8 kg  08/22/22 110.6 kg  11/28/21 108 kg   Physical Exam General: Alert and oriented x 3, NAD Cardiovascular: S1 S2 clear, RRR.  Respiratory: CTAB, no wheezing Gastrointestinal: Soft, mild diffuse TTP, nondistended, NBS Ext: no pedal edema bilaterally Neuro: no new deficits Psych: Normal affect   Data Reviewed:  I have personally reviewed following labs    CBC Lab Results  Component Value Date   WBC 8.7 10/01/2022   RBC 4.80 10/01/2022   HGB 14.1 10/01/2022   HCT 43.8 10/01/2022   MCV 91.3 10/01/2022   MCH 29.4 10/01/2022   PLT 212 10/01/2022   MCHC 32.2 10/01/2022   RDW 13.3 10/01/2022   LYMPHSABS 2.3 10/01/2022   MONOABS 0.6  10/01/2022   EOSABS 0.1 10/01/2022   BASOSABS 0.0 24/58/0998     Last metabolic panel Lab Results  Component Value Date   NA 139 10/01/2022   K 3.6 10/01/2022   CL 107 10/01/2022   CO2 25 10/01/2022   BUN 15 10/01/2022   CREATININE 0.79 10/01/2022   GLUCOSE 103 (H) 10/01/2022   GFRNONAA >60 10/01/2022   GFRAA >60 01/25/2020   CALCIUM 8.6 (L) 10/01/2022   PHOS 2.5 11/19/2018   PROT 6.2 (L) 10/01/2022   ALBUMIN 3.7 10/01/2022   BILITOT 1.1 10/01/2022   ALKPHOS 67 10/01/2022   AST 26 10/01/2022   ALT 28 10/01/2022   ANIONGAP 7 10/01/2022    CBG (last 3)  No results for input(s): "GLUCAP" in the last 72 hours.    Coagulation Profile: No results for input(s): "INR", "PROTIME" in the last 168 hours.   Radiology Studies: I have personally reviewed the imaging studies  CT ABDOMEN PELVIS WO CONTRAST  Result Date: 09/30/2022 CLINICAL DATA:  Abdominal pain. Technologist notes state chronic pancreatitis. EXAM: CT ABDOMEN AND PELVIS WITHOUT CONTRAST TECHNIQUE: Multidetector CT imaging of the abdomen and pelvis was performed following the standard protocol without IV contrast. RADIATION DOSE REDUCTION: This exam was performed according to the departmental dose-optimization program which includes automated exposure control, adjustment of the mA and/or kV according to patient size and/or use of iterative reconstruction technique. COMPARISON:  Contrast enhanced CT 01/21/2020 FINDINGS: Lower chest: Mild left basilar atelectasis adjacent to elevated hemidiaphragm. No significant pleural effusion. Hepatobiliary: Heterogeneous hepatic steatosis. Chronic pneumobilia. No evidence of focal hepatic lesion. Cholecystectomy without biliary dilatation. Pancreas: Moderate peripancreatic fat stranding about the entire pancreas, but most prominent proximally. No acute peripancreatic collection. No pancreatic ductal dilatation. No pancreatic air. No evidence of pancreatic mass on this unenhanced exam.  Spleen: Normal in size without focal abnormality. Adrenals/Urinary Tract: Normal adrenal glands. No hydronephrosis or renal calculi. Small amount of left perinephric edema is likely reactive related to pancreatic inflammation. Partially distended urinary bladder, normal for degree of distension. Stomach/Bowel: Fat stranding about the stomach is likely tracking from pancreatic inflammation. There is wall thickening of the duodenum in the region of pancreatic fat stranding. No small bowel obstruction or inflammation. Multifocal colonic diverticulosis, most prominently affecting the descending colon. No acute diverticulitis. The appendix is not definitively seen. Vascular/Lymphatic: Mild aorto bi-iliac atherosclerosis. No aneurysm. Circumaortic left renal  vein. Multiple small peripancreatic nodes, not enlarged by size criteria. There also small central mesenteric and retroperitoneal nodes. Reproductive: Status post hysterectomy. No adnexal masses. Other: Moderate peripancreatic edema. Small amount of non organized free fluid tracks into the left pericolic gutter. No free air. No focal fluid collection. Small fat containing umbilical hernia. Musculoskeletal: Mild diffuse degenerative change in the lumbar spine. Mild degenerative change of the hips. There are no acute or suspicious osseous abnormalities. IMPRESSION: 1. Acute edematous pancreatitis. No acute peripancreatic collection. 2. Heterogeneous hepatic steatosis. Chronic pneumobilia. 3. Colonic diverticulosis without acute inflammation. Aortic Atherosclerosis (ICD10-I70.0). Electronically Signed   By: Keith Rake M.D.   On: 09/30/2022 18:50       Keegen Heffern M.D. [facility] 10/01/2022, 10:53 AM  Available via Epic secure chat 7am-7pm After 7 pm, please refer to night coverage provider listed on amion.

## 2022-10-03 DIAGNOSIS — Q453 Other congenital malformations of pancreas and pancreatic duct: Secondary | ICD-10-CM

## 2022-10-03 DIAGNOSIS — F4323 Adjustment disorder with mixed anxiety and depressed mood: Secondary | ICD-10-CM | POA: Diagnosis not present

## 2022-10-03 DIAGNOSIS — I10 Essential (primary) hypertension: Secondary | ICD-10-CM | POA: Diagnosis not present

## 2022-10-03 DIAGNOSIS — K859 Acute pancreatitis without necrosis or infection, unspecified: Secondary | ICD-10-CM | POA: Diagnosis not present

## 2022-10-03 LAB — BASIC METABOLIC PANEL
Anion gap: 8 (ref 5–15)
BUN: 9 mg/dL (ref 8–23)
CO2: 22 mmol/L (ref 22–32)
Calcium: 8.2 mg/dL — ABNORMAL LOW (ref 8.9–10.3)
Chloride: 107 mmol/L (ref 98–111)
Creatinine, Ser: 0.72 mg/dL (ref 0.44–1.00)
GFR, Estimated: >60 mL/min (ref >60)
Glucose, Bld: 105 mg/dL — ABNORMAL HIGH (ref 70–99)
Potassium: 3.1 mmol/L — ABNORMAL LOW (ref 3.5–5.1)
Sodium: 137 mmol/L (ref 135–145)

## 2022-10-03 LAB — LIPASE, BLOOD: Lipase: 54 U/L — ABNORMAL HIGH (ref 11–51)

## 2022-10-03 LAB — GLUCOSE, CAPILLARY
Glucose-Capillary: 110 mg/dL — ABNORMAL HIGH (ref 70–99)
Glucose-Capillary: 94 mg/dL (ref 70–99)

## 2022-10-03 MED ORDER — IPRATROPIUM-ALBUTEROL 0.5-2.5 (3) MG/3ML IN SOLN
3.0000 mL | Freq: Three times a day (TID) | RESPIRATORY_TRACT | Status: DC
  Administered 2022-10-03 – 2022-10-04 (×3): 3 mL via RESPIRATORY_TRACT
  Filled 2022-10-03 (×3): qty 3

## 2022-10-03 MED ORDER — PANTOPRAZOLE SODIUM 40 MG PO TBEC
40.0000 mg | DELAYED_RELEASE_TABLET | Freq: Every day | ORAL | Status: DC
  Administered 2022-10-04: 40 mg via ORAL
  Filled 2022-10-03: qty 1

## 2022-10-03 MED ORDER — FUROSEMIDE 10 MG/ML IJ SOLN
20.0000 mg | Freq: Once | INTRAMUSCULAR | Status: AC
  Administered 2022-10-03: 20 mg via INTRAVENOUS
  Filled 2022-10-03: qty 2

## 2022-10-03 MED ORDER — IPRATROPIUM-ALBUTEROL 0.5-2.5 (3) MG/3ML IN SOLN
3.0000 mL | Freq: Four times a day (QID) | RESPIRATORY_TRACT | Status: DC
  Administered 2022-10-03: 3 mL via RESPIRATORY_TRACT
  Filled 2022-10-03: qty 3

## 2022-10-03 MED ORDER — POTASSIUM CHLORIDE CRYS ER 20 MEQ PO TBCR
40.0000 meq | EXTENDED_RELEASE_TABLET | Freq: Once | ORAL | Status: AC
  Administered 2022-10-03: 40 meq via ORAL
  Filled 2022-10-03: qty 2

## 2022-10-03 MED ORDER — IPRATROPIUM-ALBUTEROL 0.5-2.5 (3) MG/3ML IN SOLN: 3.0000 mL | Freq: Four times a day (QID) | RESPIRATORY_TRACT | Status: DC | PRN

## 2022-10-03 NOTE — Progress Notes (Signed)
Mobility Specialist - Progress Note   10/03/22 1358  Mobility  Activity Ambulated with assistance in hallway  Level of Assistance Modified independent, requires aide device or extra time  Assistive Device None  Distance Ambulated (ft) 350 ft  Activity Response Tolerated well  Mobility Referral Yes  $Mobility charge 1 Mobility   Pt received in bed and agreed to mobility, gait slightly improved compared to first session. Pt returned to bed with all needs met.  Roderick Pee Mobility Specialist

## 2022-10-03 NOTE — Plan of Care (Signed)

## 2022-10-03 NOTE — Progress Notes (Signed)
Mobility Specialist - Progress Note   10/03/22 1008  Mobility  Activity Ambulated with assistance in hallway  Level of Assistance Modified independent, requires aide device or extra time  Assistive Device None  Distance Ambulated (ft) 250 ft  Activity Response Tolerated well  Mobility Referral Yes  $Mobility charge 1 Mobility   Pt received in bed and agreed to mobility, short, slow gait with shuffled steps. Pt returned to bed with all needs met.   Roderick Pee Mobility Specialist

## 2022-10-03 NOTE — Care Management Important Message (Signed)
Important Message  Patient Details IM Letter given. Name: Linda Blackburn MRN: [id number] Date of Birth: 12-13-1950   Medicare Important Message Given:  Yes     Kerin Salen 10/03/2022, 10:10 AM

## 2022-10-03 NOTE — Progress Notes (Signed)
[facility]                                                                              Linda Blackburn, is a 72 y.o. female, DOB - 1950-10-05, [id number]:[id number] Admit date - 09/30/2022    Outpatient Primary MD for the patient is Burchette, Alinda Sierras, MD  LOS - 3  days  Chief Complaint  Patient presents with   Abdominal Pain       Brief summary   Patient is a 72 year old female with recurrent idiopathic pancreatitis for over 25 years, HTN, anxiety, presented with abdominal pain.  Patient reported that she woke up on the day of admission with crampy abdominal pain.  As a day progressed, her abdominal pain became worse.  Also had episodes of diaphoresis and feeling clammy, nausea and vomiting.  She tried drinking enough fluid with the pain medications however did not help.  Abdominal pain located mostly in epigastric and left upper quadrant area radiating to her back very similar to her previous episodes of pancreatitis.  In ED, lipase 3726.  CT scan consistent with acute pancreatitis.   Assessment & Plan    Principal Problem:   Acute on chronic recurrent idiopathic pancreatitis [facility]) -Patient has had extensive workup in the past.  History of cholecystectomy, does not drink alcohol. -CT abdomen showed acute edematous pancreatitis, no acute peri-pancreatic collection -Triglycerides 67 -Slowly improving, lipase 3726 on admission, improving to 54 -Abdominal pain improving, wheezing bilaterally, DC IV fluids  -Advance diet to full liquids  -Encouraged p.o. hydration and ambulation, increase mobility  Active Problems:  Fluid overload -Chest x-ray 1/29 had shown small left pleural effusion -Bilateral wheezing noted, I's and O's with 2 L positive -DC IV fluids, Lasix 20 mg IV x 1 -No history of CHF, BNP 55.5    HTN (hypertension) -BP stable continue losartan.   -Continue IV hydralazine as needed with parameters     Adjustment disorder with mixed anxiety and depressed  mood Continue Zoloft  GERD -Continue IV PPI   Obesity Estimated body mass index is 41.57 kg/m as calculated from the following:   Height as of this encounter: '5\' 1"'$  (1.549 m).   Weight as of this encounter: 99.8 kg.  Code Status: DNR DVT Prophylaxis:  enoxaparin (LOVENOX) injection 40 mg Start: 09/30/22 2200 SCDs Start: 09/30/22 1928   Level of Care: Level of care: Med-Surg Family Communication:  Disposition Plan:      Remains inpatient appropriate: Hopefully DC home tomorrow if tolerating solid diet  Procedures:  CT abdomen  Consultants:   None  Antimicrobials: None     Medications  enoxaparin (LOVENOX) injection  40 mg Subcutaneous Q24H   ipratropium-albuterol  3 mL Nebulization TID   losartan  100 mg Oral Daily   mirabegron ER  25 mg Oral Daily   [START ON 10/04/2022] pantoprazole  40 mg Oral Daily   sertraline  100 mg Oral Daily      Subjective:   Linda Blackburn was seen and examined today.  Abdominal pain is improving however now wheezing.  No coughing.  Denies any nausea or vomiting, fevers.  No chest pain or  acute shortness of breath.  Objective:   Vitals:   10/02/22 0446 10/02/22 1519 10/02/22 1944 10/03/22 0610  BP: 128/63 133/60 (!) 119/49 (!) 121/52  Pulse: 78 73 67 72  Resp: '17 20 17 17  '$ Temp: 98.6 F (37 C) 98.2 F (36.8 C) 99.4 F (37.4 C) 98.3 F (36.8 C)  TempSrc: Oral  Oral Oral  SpO2:  96% 95% 96%  Weight:      Height:       No intake or output data in the 24 hours ending 10/03/22 1350   Wt Readings from Last 3 Encounters:  09/30/22 99.8 kg  08/22/22 110.6 kg  11/28/21 108 kg   Physical Exam General: Alert and oriented x 3, NAD Cardiovascular: S1 S2 clear, RRR.  Respiratory: Bilateral expiratory wheezing Gastrointestinal: Soft, mild epigastric and LUQ TTP, nondistended, NBS Ext: trace pedal edema bilaterally Neuro: no new deficits Skin: No rashes Psych: Normal affect    Data Reviewed:  I have personally reviewed  following labs    CBC Lab Results  Component Value Date   WBC 8.9 10/02/2022   RBC 4.35 10/02/2022   HGB 12.7 10/02/2022   HCT 41.5 10/02/2022   MCV 95.4 10/02/2022   MCH 29.2 10/02/2022   PLT 179 10/02/2022   MCHC 30.6 10/02/2022   RDW 13.6 10/02/2022   LYMPHSABS 2.3 10/01/2022   MONOABS 0.6 10/01/2022   EOSABS 0.1 10/01/2022   BASOSABS 0.0 98/33/8250     Last metabolic panel Lab Results  Component Value Date   NA 137 10/03/2022   K 3.1 (L) 10/03/2022   CL 107 10/03/2022   CO2 22 10/03/2022   BUN 9 10/03/2022   CREATININE 0.72 10/03/2022   GLUCOSE 105 (H) 10/03/2022   GFRNONAA >60 10/03/2022   GFRAA >60 01/25/2020   CALCIUM 8.2 (L) 10/03/2022   PHOS 2.5 11/19/2018   PROT 6.2 (L) 10/01/2022   ALBUMIN 3.7 10/01/2022   BILITOT 1.1 10/01/2022   ALKPHOS 67 10/01/2022   AST 26 10/01/2022   ALT 28 10/01/2022   ANIONGAP 8 10/03/2022    CBG (last 3)  Recent Labs    10/02/22 1536 10/02/22 2213 10/03/22 0606  GLUCAP 61* 97 110*      Coagulation Profile: No results for input(s): "INR", "PROTIME" in the last 168 hours.   Radiology Studies: I have personally reviewed the imaging studies  DG Chest 1 View  Result Date: 10/02/2022 CLINICAL DATA:  Dyspnea. EXAM: CHEST  1 VIEW COMPARISON:  Chest radiograph 03/07/2018 FINDINGS: Heart size upper normal. Aortic atherosclerosis. Mediastinal contours are stable. Slight obscuration of left hemidiaphragm may represent small effusion. Mild bibasilar atelectasis. No pulmonary edema. No pneumothorax. Thoracic spondylosis. IMPRESSION: 1. Possible small left pleural effusion. 2. Mild bibasilar atelectasis. Electronically Signed   By: Keith Rake M.D.   On: 10/02/2022 15:15   DG Lumbar Spine 2-3 Views  Result Date: 10/02/2022 CLINICAL DATA:  Low back pain. EXAM: LUMBAR SPINE - 2-3 VIEW COMPARISON:  Reformats from abdominopelvic CT 09/30/2022 FINDINGS: There are 5 non-rib-bearing lumbar vertebra. Alignment is normal. Normal  vertebral body heights. Mild diffuse disc space narrowing and spurring. Moderate multilevel facet hypertrophy. No evidence of fracture focal bone abnormalities. The sacroiliac joints are congruent with mild degenerative change on the right. IMPRESSION: Mild diffuse degenerative disc disease and moderate multilevel facet hypertrophy. Electronically Signed   By: Keith Rake M.D.   On: 10/02/2022 15:14       Antonius Hartlage M.D. [facility] 10/03/2022, 1:50 PM  Available via Epic secure chat 7am-7pm After 7 pm, please refer to night coverage provider listed on amion.

## 2022-10-03 NOTE — TOC Initial Note (Signed)
Transition of Care [facility]) - Initial/Assessment Note    Patient Details  Name: Linda Blackburn MRN: [id number] Date of Birth: [birth year]  Transition of Care [facility]) CM/SW Contact:    Leeroy Cha, RN Phone Number: 10/03/2022, 9:07 AM  Clinical Narrative:                  Transition of Care [facility]) Screening Note   Patient Details  Name: Linda Blackburn Date of Birth: 06-20-1951   [facility] of [facility]) CM/SW Contact:    Leeroy Cha, RN Phone Number: 10/03/2022, 9:07 AM    Transition of Care Department [facility]) has reviewed patient and no TOC needs have been identified at this time. We will continue to monitor patient advancement through interdisciplinary progression rounds. If new patient transition needs arise, please place a TOC consult.    Expected Discharge Plan: Home/Self Care Barriers to Discharge: Continued Medical Work up   Patient Goals and CMS Choice Patient states their goals for this hospitalization and ongoing recovery are:: to go home CMS Medicare.gov Compare Post Acute Care list provided to:: Patient Choice offered to / list presented to : Patient      Expected Discharge Plan and Services   Discharge Planning Services: CM Consult   Living arrangements for the past 2 months: Apartment                                      Prior Living Arrangements/Services Living arrangements for the past 2 months: Apartment Lives with:: Self Patient language and need for interpreter reviewed:: Yes Do you feel safe going back to the place where you live?: Yes            Criminal Activity/Legal Involvement Pertinent to Current Situation/Hospitalization: No - Comment as needed  Activities of Daily Living Home Assistive Devices/Equipment: None ADL Screening (condition at time of admission) Patient's cognitive ability adequate to safely complete daily activities?: Yes Is the patient deaf or have difficulty hearing?: No Does the patient  have difficulty seeing, even when wearing glasses/contacts?: No Does the patient have difficulty concentrating, remembering, or making decisions?: No Patient able to express need for assistance with ADLs?: Yes Does the patient have difficulty dressing or bathing?: No Independently performs ADLs?: Yes (appropriate for developmental age) Does the patient have difficulty walking or climbing stairs?: No Weakness of Legs: None Weakness of Arms/Hands: None  Permission Sought/Granted                  Emotional Assessment Appearance:: Appears stated age Attitude/Demeanor/Rapport: Engaged Affect (typically observed): Calm Orientation: : Oriented to Self, Oriented to Place, Oriented to  Time, Oriented to Situation Alcohol / Substance Use:  (no current use of any listed) Psych Involvement: No (comment)  Admission diagnosis:  Acute on chronic pancreatitis ([place]) [K85.90, K86.1] Acute pancreatitis, unspecified complication status, unspecified pancreatitis type [K85.90] Patient Active Problem List   Diagnosis Date Noted   Acute on chronic pancreatitis ([place]) 09/30/2022   Acute pancreatitis 01/21/2020   Gastroesophageal reflux disease    Basal cell carcinoma (BCC) in situ of skin 10/11/2018   Stress incontinence 10/11/2018   Hypotension (arterial) 08/09/2018   Sinus bradycardia on ECG 08/09/2018   AKI (acute kidney injury) ([employer]) 08/09/2018   Recurrent acute pancreatitis 08/08/2018   Nausea & vomiting 03/07/2018   Sepsis ([place]) 01/01/2018   Alternating constipation and diarrhea 09/26/2016   Obesity (BMI 30-39.9) 07/20/2014  Chronic insomnia 06/16/2014   Hypokalemia 06/10/2014   Volume overload 06/10/2014   UTI (urinary tract infection) 06/10/2014   Leukocytosis 06/10/2014   Acute respiratory failure with hypoxia ([place]) 06/10/2014   Acute recurrent pancreatitis 06/06/2014   Pancreatitis 06/06/2014   HTN (hypertension) 09/11/2012   Adjustment disorder with mixed anxiety and depressed  mood 09/11/2012   Pancreas divisum 01/18/2011   URI 09/02/2010   Depression, recurrent ([place]) 04/01/2010   PANCREATITIS, HX OF 04/01/2010   PCP:  [name], MD Pharmacy:   [facility] [place], [address] AT [address] [place] [place] [address] Phone: [phone] Fax: [phone]  [facility] [place], [address] [place] [address] Phone: [phone] Fax: [phone]     Social Determinants of Health (SDOH) Social History: [place]: No Food Insecurity (10/01/2022)  Housing: Low Risk  (10/01/2022)  Transportation Needs: No Transportation Needs (10/01/2022)  Utilities: Not At Risk (10/01/2022)  Alcohol Screen: Low Risk  (11/23/2020)  Depression (PHQ2-9): Low Risk  (08/22/2022)  Financial Resource Strain: Low Risk  (11/28/2021)  Physical Activity: Inactive (11/28/2021)  Social Connections: Moderately Isolated (11/23/2020)  Stress: No Stress Concern Present (11/28/2021)  Tobacco Use: Low Risk  (10/01/2022)   SDOH Interventions:     Readmission Risk Interventions   No data to display

## 2022-10-04 DIAGNOSIS — K861 Other chronic pancreatitis: Secondary | ICD-10-CM | POA: Diagnosis not present

## 2022-10-04 DIAGNOSIS — K859 Acute pancreatitis without necrosis or infection, unspecified: Secondary | ICD-10-CM | POA: Diagnosis not present

## 2022-10-04 LAB — CBC
HCT: 38.4 % (ref 36.0–46.0)
Hemoglobin: 12.1 g/dL (ref 12.0–15.0)
MCH: 29.1 pg (ref 26.0–34.0)
MCHC: 31.5 g/dL (ref 30.0–36.0)
MCV: 92.3 fL (ref 80.0–100.0)
Platelets: 197 10*3/uL (ref 150–400)
RBC: 4.16 MIL/uL (ref 3.87–5.11)
RDW: 13.3 % (ref 11.5–15.5)
WBC: 7.6 10*3/uL (ref 4.0–10.5)
nRBC: 0 % (ref 0.0–0.2)

## 2022-10-04 LAB — BASIC METABOLIC PANEL
Anion gap: 11 (ref 5–15)
BUN: 8 mg/dL (ref 8–23)
CO2: 23 mmol/L (ref 22–32)
Calcium: 8.3 mg/dL — ABNORMAL LOW (ref 8.9–10.3)
Chloride: 103 mmol/L (ref 98–111)
Creatinine, Ser: 0.79 mg/dL (ref 0.44–1.00)
GFR, Estimated: >60 mL/min (ref >60)
Glucose, Bld: 85 mg/dL (ref 70–99)
Potassium: 3.6 mmol/L (ref 3.5–5.1)
Sodium: 137 mmol/L (ref 135–145)

## 2022-10-04 LAB — LIPASE, BLOOD: Lipase: 42 U/L (ref 11–51)

## 2022-10-04 MED ORDER — HYDROCODONE-ACETAMINOPHEN 5-325 MG PO TABS: 1.0000 | ORAL_TABLET | Freq: Four times a day (QID) | ORAL | 0 refills | Status: DC | PRN

## 2022-10-04 MED ORDER — ONDANSETRON 8 MG PO TBDP: 8.0000 mg | ORAL_TABLET | Freq: Three times a day (TID) | ORAL | 0 refills | Status: DC | PRN

## 2022-10-04 MED ORDER — HYDROCODONE-ACETAMINOPHEN 5-325 MG PO TABS
1.0000 | ORAL_TABLET | ORAL | Status: DC | PRN
  Administered 2022-10-04: 2 via ORAL
  Filled 2022-10-04: qty 2

## 2022-10-04 NOTE — Discharge Instructions (Signed)
Linda Blackburn,  You were in the hospital with pancreatitis. This has improved with bowel rest. Please follow-up with your PCP. We discussed your losartan as a risk factor for pancreatitis. Please follow-up with your PCP about this and possibly switching to a new blood pressure medication if you both feel it is necessary.

## 2022-10-04 NOTE — Progress Notes (Signed)
Mobility Specialist - Progress Note   10/04/22 1149  Mobility  Activity Ambulated independently in hallway  Level of Assistance Independent  Assistive Device None  Distance Ambulated (ft) 350 ft  Range of Motion/Exercises Active  Activity Response Tolerated well  Mobility Referral Yes  $Mobility charge 1 Mobility   Pt was found in bed and agreeable to ambulate. Had no complaints during session and at EOS returned to bed with all necessities in reach.   Ferd Hibbs Mobility Specialist

## 2022-10-04 NOTE — Plan of Care (Signed)

## 2022-10-04 NOTE — Hospital Course (Signed)
Linda Blackburn is a 72 y.o. female with a history of hypertension, anxiety, recurrent idiopathic pancreatitis. Patient presented secondary to abdominal pain with evidence of acute pancreatitis. Patient managed with bowel rest and supportive care with improvement of symptoms. Diet was advanced as tolerated and patient tolerated a soft diet prior to discharge.

## 2022-10-04 NOTE — Discharge Summary (Signed)
Physician Discharge Summary   Patient: Linda Blackburn MRN: [id number] DOB: Dec 23, 1950  Admit date:     09/30/2022  Discharge date: 10/04/22  Discharge Physician: Cordelia Poche, MD   PCP: [name], MD   Recommendations at discharge:  Low fat diet PCP follow-up Consider changing losartan as a possible contributor to recurrent pancreatitis  Discharge Diagnoses: Principal Problem:   Acute on chronic pancreatitis [facility]) Active Problems:   Pancreas divisum   HTN (hypertension)   Adjustment disorder with mixed anxiety and depressed mood  Resolved Problems:   * No resolved hospital problems. *  Hospital Course: Linda Blackburn is a 72 y.o. female with a history of hypertension, anxiety, recurrent idiopathic pancreatitis. Patient presented secondary to abdominal pain with evidence of acute pancreatitis. Patient managed with bowel rest and supportive care with improvement of symptoms. Diet was advanced as tolerated and patient tolerated a soft diet prior to discharge.  Assessment and Plan:  Acute pancreatitis Recurrent pancreatitis Patient with prior extensive workup with no concrete etiology for pancreatitis. Lipase of 3,726 on admission and CT abdomen/pelvis significant for acute edematous pancreatitis. Patient managed with bowel rest and diet advanced as tolerated with improvement in symptoms.  Fluid overload Possibly secondary to IV fluids from inpatient fluid resuscitation. Patient given lasix with improvement of fluid overload. Chest x-ray without pulmonary edema or significant pleural effusion  Primary hypertension Continue losartan, although I did discuss with patient to consider transition to a different antihypertensive in setting of recurrent pancreatitis, but she will discuss with PCP.  Adjustment disorder with mixed anxiety and depressed mood Continue Zoloft.  GERD Continue Protonix.  Diverticulosis Noted on CT abdomen/pelvis. Patient with some LLQ  abdominal pain but afebrile and without leukocytosis. Discussed signs/symptoms of diverticulitis.  Consultants: None Procedures performed: None  Disposition: Home Diet recommendation: Soft diet/low sodium   DISCHARGE MEDICATION: Allergies as of 10/04/2022       Reactions   Other Hives, Swelling   AQUASONIC [place] GEL   Buprenorphine Hcl    Codeine    GI upset   Dilaudid [hydromorphone Hcl] Itching   Morphine And Related    Ants crawling on skin        Medication List     TAKE these medications    fluconazole 100 MG tablet Commonly known as: DIFLUCAN Take 1 tablet (100 mg total) by mouth daily as needed. What changed: reasons to take this   HYDROcodone-acetaminophen 5-325 MG tablet Commonly known as: NORCO/VICODIN Take 1-2 tablets by mouth every 6 (six) hours as needed for moderate pain or severe pain.   losartan 100 MG tablet Commonly known as: COZAAR Take 1 tablet (100 mg total) by mouth daily.   ondansetron 8 MG disintegrating tablet Commonly known as: ZOFRAN-ODT Take 1 tablet (8 mg total) by mouth every 8 (eight) hours as needed for nausea or vomiting.   pantoprazole 40 MG tablet Commonly known as: PROTONIX Take 1 tablet (40 mg total) by mouth daily.   sertraline 100 MG tablet Commonly known as: Zoloft TAKE ONE TABLET BY MOUTH EVERY DAY   Vibegron 75 MG Tabs Take 1 tablet by mouth daily at 6 (six) AM.   vitamin B-12 100 MCG tablet Commonly known as: CYANOCOBALAMIN Take 100 mcg by mouth daily.   zolpidem 5 MG tablet Commonly known as: AMBIEN Take 1 tablet (5 mg total) by mouth at bedtime as needed for sleep.        Follow-up Information     Burchette, Alinda Sierras, MD.  Schedule an appointment as soon as possible for a visit in 1 week(s).   Specialty: Family Medicine Why: For hospital follow-up Contact information: [place] [address] [phone]                Discharge Exam: BP 120/60 (BP Location: Right Arm)    Pulse 78   Temp 99 F (37.2 C) (Oral)   Resp 18   Ht '5\' 1"'$  (1.549 m)   Wt 99.8 kg   SpO2 97%   BMI 41.57 kg/m   General exam: Appears calm and comfortable Respiratory system: Clear to auscultation. Respiratory effort normal. Cardiovascular system: S1 & S2 heard, RRR. Gastrointestinal system: Abdomen is nondistended, soft and tender in upper and LLQ. Normal bowel sounds heard. Central nervous system: Alert and oriented. No focal neurological deficits. Musculoskeletal: No edema. No calf tenderness Skin: No cyanosis. No rashes Psychiatry: Judgement and insight appear normal. Mood & affect appropriate.   Condition at discharge: stable  The results of significant diagnostics from this hospitalization (including imaging, microbiology, ancillary and laboratory) are listed below for reference.   Imaging Studies: DG Chest 1 View  Result Date: 10/02/2022 CLINICAL DATA:  Dyspnea. EXAM: CHEST  1 VIEW COMPARISON:  Chest radiograph 03/07/2018 FINDINGS: Heart size upper normal. Aortic atherosclerosis. Mediastinal contours are stable. Slight obscuration of left hemidiaphragm may represent small effusion. Mild bibasilar atelectasis. No pulmonary edema. No pneumothorax. Thoracic spondylosis. IMPRESSION: 1. Possible small left pleural effusion. 2. Mild bibasilar atelectasis. Electronically Signed   By: Keith Rake M.D.   On: 10/02/2022 15:15   DG Lumbar Spine 2-3 Views  Result Date: 10/02/2022 CLINICAL DATA:  Low back pain. EXAM: LUMBAR SPINE - 2-3 VIEW COMPARISON:  Reformats from abdominopelvic CT 09/30/2022 FINDINGS: There are 5 non-rib-bearing lumbar vertebra. Alignment is normal. Normal vertebral body heights. Mild diffuse disc space narrowing and spurring. Moderate multilevel facet hypertrophy. No evidence of fracture focal bone abnormalities. The sacroiliac joints are congruent with mild degenerative change on the right. IMPRESSION: Mild diffuse degenerative disc disease and moderate multilevel  facet hypertrophy. Electronically Signed   By: Keith Rake M.D.   On: 10/02/2022 15:14   CT ABDOMEN PELVIS WO CONTRAST  Result Date: 09/30/2022 CLINICAL DATA:  Abdominal pain. Technologist notes state chronic pancreatitis. EXAM: CT ABDOMEN AND PELVIS WITHOUT CONTRAST TECHNIQUE: Multidetector CT imaging of the abdomen and pelvis was performed following the standard protocol without IV contrast. RADIATION DOSE REDUCTION: This exam was performed according to the departmental dose-optimization program which includes automated exposure control, adjustment of the mA and/or kV according to patient size and/or use of iterative reconstruction technique. COMPARISON:  Contrast enhanced CT 01/21/2020 FINDINGS: Lower chest: Mild left basilar atelectasis adjacent to elevated hemidiaphragm. No significant pleural effusion. Hepatobiliary: Heterogeneous hepatic steatosis. Chronic pneumobilia. No evidence of focal hepatic lesion. Cholecystectomy without biliary dilatation. Pancreas: Moderate peripancreatic fat stranding about the entire pancreas, but most prominent proximally. No acute peripancreatic collection. No pancreatic ductal dilatation. No pancreatic air. No evidence of pancreatic mass on this unenhanced exam. Spleen: Normal in size without focal abnormality. Adrenals/Urinary Tract: Normal adrenal glands. No hydronephrosis or renal calculi. Small amount of left perinephric edema is likely reactive related to pancreatic inflammation. Partially distended urinary bladder, normal for degree of distension. Stomach/Bowel: Fat stranding about the stomach is likely tracking from pancreatic inflammation. There is wall thickening of the duodenum in the region of pancreatic fat stranding. No small bowel obstruction or inflammation. Multifocal colonic diverticulosis, most prominently affecting the descending colon.  No acute diverticulitis. The appendix is not definitively seen. Vascular/Lymphatic: Mild aorto bi-iliac  atherosclerosis. No aneurysm. Circumaortic left renal vein. Multiple small peripancreatic nodes, not enlarged by size criteria. There also small central mesenteric and retroperitoneal nodes. Reproductive: Status post hysterectomy. No adnexal masses. Other: Moderate peripancreatic edema. Small amount of non organized free fluid tracks into the left pericolic gutter. No free air. No focal fluid collection. Small fat containing umbilical hernia. Musculoskeletal: Mild diffuse degenerative change in the lumbar spine. Mild degenerative change of the hips. There are no acute or suspicious osseous abnormalities. IMPRESSION: 1. Acute edematous pancreatitis. No acute peripancreatic collection. 2. Heterogeneous hepatic steatosis. Chronic pneumobilia. 3. Colonic diverticulosis without acute inflammation. Aortic Atherosclerosis (ICD10-I70.0). Electronically Signed   By: Keith Rake M.D.   On: 09/30/2022 18:50    Microbiology: Results for orders placed or performed during the hospital encounter of 01/21/20  SARS Coronavirus 2 by RT PCR (hospital order, performed in [facility] hospital lab) Nasopharyngeal Nasopharyngeal Swab     Status: None   Collection Time: 01/21/20  3:06 PM   Specimen: Nasopharyngeal Swab  Result Value Ref Range Status   SARS Coronavirus 2 NEGATIVE NEGATIVE Final    Comment: (NOTE) SARS-CoV-2 target nucleic acids are NOT DETECTED. The SARS-CoV-2 RNA is generally detectable in upper and lower respiratory specimens during the acute phase of infection. The lowest concentration of SARS-CoV-2 viral copies this assay can detect is 250 copies / mL. A negative result does not preclude SARS-CoV-2 infection and should not be used as the sole basis for treatment or other patient management decisions.  A negative result may occur with improper specimen collection / handling, submission of specimen other than nasopharyngeal swab, presence of viral mutation(s) within the areas targeted by this  assay, and inadequate number of viral copies (<250 copies / mL). A negative result must be combined with clinical observations, patient history, and epidemiological information. Fact Sheet for Patients:   StrictlyIdeas.no Fact Sheet for Healthcare Providers: BankingDealers.co.za This test is not yet approved or cleared  by the [place] FDA and has been authorized for detection and/or diagnosis of SARS-CoV-2 by FDA under an Emergency Use Authorization (EUA).  This EUA will remain in effect (meaning this test can be used) for the duration of the COVID-19 declaration under Section 564(b)(1) of the Act, 21 U.S.C. section 360bbb-3(b)(1), unless the authorization is terminated or revoked sooner. Performed at [facility], [place] [address], [place], [place] [address]     Labs: CBC: Recent Labs  Lab 09/30/22 1538 10/01/22 0425 10/02/22 0339 10/04/22 0355  WBC 10.8* 8.7 8.9 7.6  NEUTROABS 8.8* 5.7  --   --   HGB 15.5* 14.1 12.7 12.1  HCT 48.3* 43.8 41.5 38.4  MCV 90.1 91.3 95.4 92.3  PLT 239 212 179 790   Basic Metabolic Panel: Recent Labs  Lab 09/30/22 1538 09/30/22 2005 10/01/22 0425 10/02/22 0339 10/03/22 0356 10/04/22 0355  NA 137  --  139 135 137 137  K 4.2  --  3.6 3.6 3.1* 3.6  CL 108  --  107 106 107 103  CO2 22  --  25 19* 22 23  GLUCOSE 127*  --  103* 77 105* 85  BUN 17  --  '15 11 9 8  '$ CREATININE 0.89 0.78 0.79 0.77 0.72 0.79  CALCIUM 9.0  --  8.6* 8.1* 8.2* 8.3*  MG  --   --  1.9  --   --   --  Liver Function Tests: Recent Labs  Lab 09/30/22 1538 10/01/22 0425  AST 34 26  ALT 32 28  ALKPHOS 73 67  BILITOT 0.9 1.1  PROT 7.0 6.2*  ALBUMIN 4.1 3.7   CBG: Recent Labs  Lab 10/02/22 0740 10/02/22 1536 10/02/22 2213 10/03/22 0606 10/03/22 1631  GLUCAP 73 61* 97 110* 94    Discharge time spent: 35 minutes.  Signed: Cordelia Poche, MD [facility] 10/04/2022

## 2022-10-05 ENCOUNTER — Telehealth: Payer: Self-pay

## 2022-10-05 NOTE — Patient Outreach (Signed)
  Care Coordination TOC Note Transition Care Management Unsuccessful Follow-up Telephone Call  Date of discharge and from where:  10/04/22-[facility]   Attempts:  1st Attempt  Reason for unsuccessful TCM follow-up call:  Left voice message     Hetty Blend [place] Management Telephonic Care Management Coordinator Direct Phone: [phone] Toll Free: [phone] Fax: [phone]

## 2022-10-05 NOTE — Patient Outreach (Signed)
  Care Coordination TOC Note Transition Care Management Follow-up Telephone Call Date of discharge and from where: 10/04/22- [facility]  Dx: "acute pancreatitis" How have you been since you were released from the hospital? Incoming call form patient returning RN CM call. She voices that she slept pretty well last night after taking some pain medicine." She has some soreness-managed with current pain regimen. She reports chronic nausea-managed with meds. She had had a small breakfast and tolerated it.  Any questions or concerns? No  Items Reviewed: Did the pt receive and understand the discharge instructions provided? Yes  Medications obtained and verified? Yes  Other? No  Any new allergies since your discharge? No  Dietary orders reviewed? Yes Do you have support at home? Yes   Home Care and Equipment/Supplies: Were home health services ordered? not applicable If so, what is the name of the agency? N/A  Has the agency set up a time to come to the patient's home? not applicable Were any new equipment or medical supplies ordered?  No What is the name of the medical supply agency? N/A Were you able to get the supplies/equipment? not applicable Do you have any questions related to the use of the equipment or supplies? No  Functional Questionnaire: (I = Independent and D = Dependent) ADLs: I  Bathing/Dressing- I  Meal Prep- I  Eating- I  Maintaining continence- I  Transferring/Ambulation- I  Managing Meds- I  Follow up appointments reviewed:  PCP Hospital f/u appt confirmed? Yes  Scheduled to see Dr. Elease Hashimoto on 10/11/22 @ 2 pm. [facility] f/u appt confirmed?  N/A   Are transportation arrangements needed? No  If their condition worsens, is the pt aware to call PCP or go to the Emergency Dept.? Yes Was the patient provided with contact information for the PCP's office or ED? Yes Was to pt encouraged to call back with questions or concerns? Yes  SDOH  assessments and interventions completed:   Yes SDOH Interventions Today    Flowsheet Row Most Recent Value  SDOH Interventions   Food Insecurity Interventions Intervention Not Indicated  Transportation Interventions Intervention Not Indicated       Care Coordination Interventions:  Education provided    Encounter Outcome:  Pt. Visit Completed    Enzo Montgomery, RN,BSN,CCM [place] Management Telephonic Care Management Coordinator Direct Phone: [phone] Toll Free: [phone] Fax: [phone]

## 2022-10-11 ENCOUNTER — Ambulatory Visit (INDEPENDENT_AMBULATORY_CARE_PROVIDER_SITE_OTHER): Payer: Medicare Other | Admitting: Family Medicine

## 2022-10-11 ENCOUNTER — Encounter: Payer: Self-pay | Admitting: Family Medicine

## 2022-10-11 VITALS — BP 140/80 | HR 71 | Temp 97.4°F | Ht 61.0 in | Wt 232.3 lb

## 2022-10-11 DIAGNOSIS — K859 Acute pancreatitis without necrosis or infection, unspecified: Secondary | ICD-10-CM

## 2022-10-11 DIAGNOSIS — K861 Other chronic pancreatitis: Secondary | ICD-10-CM | POA: Diagnosis not present

## 2022-10-11 DIAGNOSIS — K529 Noninfective gastroenteritis and colitis, unspecified: Secondary | ICD-10-CM | POA: Diagnosis not present

## 2022-10-11 DIAGNOSIS — I10 Essential (primary) hypertension: Secondary | ICD-10-CM

## 2022-10-11 NOTE — Progress Notes (Signed)
Established Patient Office Visit  Subjective   Patient ID: Linda Blackburn, female    DOB: June 18, 1951  Age: 72 y.o. MRN: [id number]  Chief Complaint  Patient presents with   Hospitalization Follow-up    HPI   Linda Blackburn is seen for hospital follow-up.  She has longstanding history of recurrent pancreatitis and had recent flareup which prompted admission January 27 through 31.  Her lipase went up to 3726 but probably came down to 42 after a few days of bowel rest and supportive care.  She had extensive workup in the past including locally as well as [place].  She has diagnosis of pancreas divisum.  He had attempted ERCP procedures [facility] and in [place] but had technical difficulties both places.  She does not use any alcohol.  Recent CT abdomen pelvis showed changes of acute edematous pancreatitis.  This flareup was similar to previous flareups.  She did apparently get some volume overload and was treated with Lasix.  Chest x-ray showed no pulmonary edema.  There was discussion of losartan as a possible trigger but she has been on this for many years and none of the pancreatitis experts recommended discontinuation.  Interestingly, she takes Protonix 40 mg daily and that is listed as a potential trigger.  We discussed the fact that many drugs are listed as potential triggers including ACE inhibitors such as lisinopril.  She is developing gradually more chronic diarrhea.  Sometimes has 6 watery stools a day.  No weight loss surprisingly.  Has not been screened for pancreas exocrine insufficiency recently.  No recent bloody stools.  She has poor appetite.  Still has some epigastric pain but gradually improving.  Past Medical History:  Diagnosis Date   Abnormal uterine bleeding    Amenorrhea    Anxiety    Depression    Gallstones    Hay fever    HTN (hypertension)    Pancreatitis    Urine incontinence    UTI (urinary tract infection)    reoccuring     Past Surgical History:  Procedure Laterality Date   ABDOMINAL HYSTERECTOMY  1984   fibroids   APPENDECTOMY  1984   BILIARY DILATION  05/29/2019   Procedure: BILIARY DILATION;  Surgeon: Irving Copas., MD;  Location: [facility];  Service: Gastroenterology;;   BIOPSY  05/29/2019   Procedure: BIOPSY;  Surgeon: Irving Copas., MD;  Location: [facility];  Service: Gastroenterology;;   CATARACT EXTRACTION Right 11/22/2021   CHOLECYSTECTOMY  09/08/1999   ERCP N/A 05/29/2019   Procedure: ENDOSCOPIC RETROGRADE CHOLANGIOPANCREATOGRAPHY (ERCP);  Surgeon: Irving Copas., MD;  Location: [facility];  Service: Gastroenterology;  Laterality: N/A;   ERCP W/ SPHINCTEROTOMY AND BALLOON DILATION  08/2008   ESOPHAGOGASTRODUODENOSCOPY (EGD) WITH PROPOFOL N/A 05/29/2019   Procedure: ESOPHAGOGASTRODUODENOSCOPY (EGD) WITH PROPOFOL;  Surgeon: Rush Landmark Telford Nab., MD;  Location: [facility];  Service: Gastroenterology;  Laterality: N/A;   OOPHORECTOMY     Only 1 was taken   POLYPECTOMY  05/29/2019   Procedure: POLYPECTOMY;  Surgeon: Mansouraty, Telford Nab., MD;  Location: [facility];  Service: Gastroenterology;;   REMOVAL OF STONES  05/29/2019   Procedure: REMOVAL OF STONES;  Surgeon: Irving Copas., MD;  Location: [facility];  Service: Gastroenterology;;   TONSILLECTOMY     TUBAL LIGATION      reports that she has never smoked. She has never used smokeless tobacco. She reports that she does not currently use alcohol. She reports that she does  not use drugs. family history includes Alcohol abuse in her mother; Cerebral palsy in her granddaughter; Hiatal hernia in her maternal aunt; Prostate cancer in her father and paternal grandfather; Stroke in her maternal aunt. Allergies  Allergen Reactions   Other Hives and Swelling    AQUASONIC [place] GEL   Buprenorphine Hcl    Codeine     GI upset   Dilaudid [Hydromorphone Hcl] Itching   Morphine And Related     Ants  crawling on skin    Review of Systems  Constitutional:  Negative for chills, fever and weight loss.  Respiratory:  Negative for cough and shortness of breath.   Cardiovascular:  Negative for chest pain.  Gastrointestinal:  Positive for abdominal pain and diarrhea. Negative for blood in stool, constipation, melena, nausea and vomiting.  Genitourinary:  Negative for dysuria.  Neurological:  Negative for dizziness.      Objective:     BP (!) 140/80 (BP Location: Left Arm, Patient Position: Sitting, Cuff Size: Large)   Pulse 71   Temp (!) 97.4 F (36.3 C) (Oral)   Ht '5\' 1"'$  (1.549 m)   Wt 232 lb 4.8 oz (105.4 kg)   SpO2 98%   BMI 43.89 kg/m  BP Readings from Last 3 Encounters:  10/11/22 (!) 140/80  10/04/22 120/60  08/22/22 122/80   Wt Readings from Last 3 Encounters:  10/11/22 232 lb 4.8 oz (105.4 kg)  09/30/22 220 lb (99.8 kg)  08/22/22 243 lb 12.8 oz (110.6 kg)      Physical Exam Vitals reviewed.  Cardiovascular:     Rate and Rhythm: Normal rate and regular rhythm.  Pulmonary:     Effort: Pulmonary effort is normal.     Breath sounds: Normal breath sounds.  Abdominal:     Palpations: Abdomen is soft.     Comments: Normal bowel sounds.  Nondistended.  Slightly tender epigastric region.  Musculoskeletal:     Right lower leg: No edema.     Left lower leg: No edema.  Neurological:     Mental Status: She is alert.      No results found for any visits on 10/11/22.  Last CBC Lab Results  Component Value Date   WBC 7.6 10/04/2022   HGB 12.1 10/04/2022   HCT 38.4 10/04/2022   MCV 92.3 10/04/2022   MCH 29.1 10/04/2022   RDW 13.3 10/04/2022   PLT 197 25/36/6440   Last metabolic panel Lab Results  Component Value Date   GLUCOSE 85 10/04/2022   NA 137 10/04/2022   K 3.6 10/04/2022   CL 103 10/04/2022   CO2 23 10/04/2022   BUN 8 10/04/2022   CREATININE 0.79 10/04/2022   GFRNONAA >60 10/04/2022   CALCIUM 8.3 (L) 10/04/2022   PHOS 2.5 11/19/2018   PROT  6.2 (L) 10/01/2022   ALBUMIN 3.7 10/01/2022   BILITOT 1.1 10/01/2022   ALKPHOS 67 10/01/2022   AST 26 10/01/2022   ALT 28 10/01/2022   ANIONGAP 11 10/04/2022   Last lipids Lab Results  Component Value Date   CHOL 154 01/24/2020   HDL 35 (L) 01/24/2020   LDLCALC 98 01/24/2020   LDLDIRECT 153.9 07/10/2013   TRIG 67 10/01/2022   CHOLHDL 4.4 01/24/2020   Last thyroid functions Lab Results  Component Value Date   TSH 1.69 08/17/2021      The 10-year ASCVD risk score (Arnett DK, et al., 2019) is: 19.9%    Assessment & Plan:   #1 acute on chronic pancreatitis.  She has very longstanding history of this.  She is gradually improving with recent bowel rest and supportive care.  There was discussion about potential medication triggers.  However, she has been on losartan for years and we do not see this listed as a likely trigger.  Also, she consulted with pancreatitis experts at [facility] and they did not recommend discontinuation.  We did offer to switch her to another medication such as possibly Cardizem but she declines at this time.  #2 intermittent and somewhat progressive nonbloody diarrhea symptoms.  Query exocrine insufficiency although she is not having any weight loss.  We did discuss checking pancreatic fecal elastase screen.  She knows to avoid high fat foods  #3 hypertension.  Slightly elevated.  Work on weight loss.  Continue to monitor.  She thinks this may be slightly up today because of recent inactivity and also the fact she is in some pain today although gradually improving still.  #4 depression, recurrent stable on sertraline.  Continue current medication  45 minutes were spent in face-to-face and non-face-to-face time reviewing recent hospital records, discussing current symptoms and further workup  Carolann Littler, MD

## 2022-10-12 ENCOUNTER — Telehealth: Payer: Self-pay | Admitting: Family Medicine

## 2022-10-12 NOTE — Telephone Encounter (Signed)
Pt is calling and need a new rx ondansetron (ZOFRAN-ODT) 4 MG disintegrating table .  Pt will take 2 mg to equal 8 mg they do not have 8 mg in stock. Pt need enough for 1 yr  [facility] - [place], [place] Phone: [phone]  Fax: [phone]

## 2022-10-13 MED ORDER — ONDANSETRON 4 MG PO TBDP: ORAL_TABLET | ORAL | 1 refills | Status: DC

## 2022-10-13 NOTE — Telephone Encounter (Signed)
Rx sent 

## 2022-10-19 DIAGNOSIS — K529 Noninfective gastroenteritis and colitis, unspecified: Secondary | ICD-10-CM | POA: Diagnosis not present

## 2022-10-27 LAB — PANCREATIC ELASTASE, FECAL: Pancreatic Elastase-1, Stool: >500 mcg/g

## 2022-11-29 ENCOUNTER — Telehealth: Payer: Self-pay | Admitting: Family Medicine

## 2022-11-29 NOTE — Telephone Encounter (Signed)
Contacted [name] to schedule their annual wellness visit. Appointment made for 11/30/22.  Linda Blackburn AWV direct phone # [phone]  R/s appt from 4/1 to 3/28 Pt aware of appt date/time change

## 2022-11-30 ENCOUNTER — Telehealth (INDEPENDENT_AMBULATORY_CARE_PROVIDER_SITE_OTHER): Payer: Medicare Other | Admitting: Family Medicine

## 2022-11-30 ENCOUNTER — Encounter: Payer: Self-pay | Admitting: Family Medicine

## 2022-11-30 DIAGNOSIS — Z0000 Encounter for general adult medical examination without abnormal findings: Secondary | ICD-10-CM | POA: Diagnosis not present

## 2022-11-30 NOTE — Progress Notes (Signed)
PATIENT CHECK-IN and HEALTH RISK ASSESSMENT QUESTIONNAIRE:  -completed by phone/video for upcoming Medicare Preventive Visit  Pre-Visit Check-in: 1)Vitals (height, wt, BP, etc) - record in vitals section for visit on day of visit 2)Review and Update Medications, Allergies PMH, Surgeries, Social history in Epic 3)Hospitalizations in the last year with date/reason? Linda Blackburn 2024 due to pancreatitis  4)Review and Update Care Team (patient's specialists) in Epic 5) Complete PHQ9 in Epic  6) Complete Fall Screening in [place] Maintenance Due and order under PCP if not done.  8)Medicare Wellness Questionnaire: Answer theses question about your habits: Do you drink alcohol? no If yes, how many drinks do you have a day? Have you ever smoked?no Quit date if applicable?   How many packs a day do/did you smoke?  Do you use smokeless tobacco?no Do you use an illicit drugs?no Do you exercises? Yes IF so, what type and how many days/minutes per week?walking 3 times a week for about, sometimes has some knee issues - has some OA of the knee Are you sexually active? No- Number of partners? Typical breakfast-eggs with toast, bagel, bacon, coffee Typical lunch-sandwich Typical dinner-varies; hot dogs, fish and chicken Typical snacks: ice cream  Beverages: water, ice tea and coffee  Answer theses question about you: Can you perform most household chores?yes-has housekeeper for assistance Do you find it hard to follow a conversation in a noisy room?no Do you often ask people to speak up or repeat themselves?no Do you feel that you have a problem with memory?no Do you balance your checkbook and or bank acounts?yes Do you feel safe at home?yes Last dentist visit? 6 months ago Do you need assistance with any of the following: Please note if so   Driving? no  Feeding yourself? no  Getting from bed to chair? no  Getting to the toilet? no  Bathing or showering? no  Dressing  yourself? no  Managing money? no  Climbing a flight of stairs-no  Preparing meals? no  Do you have Advanced Directives in place (Living Will, Healthcare Power or [place])? yes   Last eye Exam and location? Dr. Prudencio Burly [facility]) October 2023   Do you currently use prescribed or non-prescribed narcotic or opioid pain medications?no  Do you have a history or close family history of breast, ovarian, tubal or peritoneal cancer or a family member with BRCA (breast cancer susceptibility 1 and 2) gene mutations? no  Nurse/Assistant Credentials/time stamp: Luellen Pucker     ----------------------------------------------------------------------------------------------------------------------------------------------------------------------------------------------------------------------   [place]: (Welcome to Medicare, initial annual wellness or annual wellness exam)  Virtual Visit via Phone Note  I connected with Linda Blackburn on 11/30/22 by phone and verified that I am speaking with the correct person using two identifiers.  Location patient: home Location provider:work or home office Persons participating in the virtual visit: patient, provider  Concerns and/or follow up today: reports is doing good - no concerns.    See HM section in Epic for other details of completed HM.    ROS: negative for report of fevers, unintentional weight loss, vision changes, vision loss, hearing loss or change, chest pain, sob, hemoptysis, melena, hematochezia, hematuria, falls, bleeding or bruising, loc, thoughts of suicide or self harm, memory loss  Patient-completed extensive health risk assessment - reviewed and discussed with the patient: See Health Risk Assessment completed with patient prior to the visit either above or in recent phone note. This was reviewed in detailed with the patient today and  appropriate recommendations, orders and  referrals were placed as needed per Summary below and patient instructions.   Review of Medical History: -PMH, PSH, Family History and current specialty and care providers reviewed and updated and listed below   Patient Care Team: [name], MD as PCP - General Milus Banister, MD as Attending Physician (Gastroenterology) Katy Apo, MD as Consulting Physician (Ophthalmology)   Past Medical History:  Diagnosis Date   Abnormal uterine bleeding    Amenorrhea    Anxiety    Depression    Gallstones    Hay fever    HTN (hypertension)    Pancreatitis    Urine incontinence    UTI (urinary tract infection)    reoccuring     Past Surgical History:  Procedure Laterality Date   ABDOMINAL HYSTERECTOMY  1984   fibroids   [place]  05/29/2019   Procedure: BILIARY DILATION;  Surgeon: Irving Copas., MD;  Location: [facility];  Service: Gastroenterology;;   BIOPSY  05/29/2019   Procedure: BIOPSY;  Surgeon: Irving Copas., MD;  Location: [facility];  Service: Gastroenterology;;   CATARACT EXTRACTION Right 11/22/2021   CHOLECYSTECTOMY  09/08/1999   ERCP N/A 05/29/2019   Procedure: ENDOSCOPIC RETROGRADE CHOLANGIOPANCREATOGRAPHY (ERCP);  Surgeon: Irving Copas., MD;  Location: [facility];  Service: Gastroenterology;  Laterality: N/A;   ERCP W/ SPHINCTEROTOMY AND BALLOON DILATION  08/2008   ESOPHAGOGASTRODUODENOSCOPY (EGD) WITH PROPOFOL N/A 05/29/2019   Procedure: ESOPHAGOGASTRODUODENOSCOPY (EGD) WITH PROPOFOL;  Surgeon: Rush Landmark Telford Nab., MD;  Location: [facility];  Service: Gastroenterology;  Laterality: N/A;   OOPHORECTOMY     Only 1 was taken   POLYPECTOMY  05/29/2019   Procedure: POLYPECTOMY;  Surgeon: Mansouraty, Telford Nab., MD;  Location: [facility];  Service: Gastroenterology;;   REMOVAL OF STONES  05/29/2019   Procedure: REMOVAL OF STONES;  Surgeon: Irving Copas., MD;  Location: [facility];  Service: Gastroenterology;;   TONSILLECTOMY     TUBAL LIGATION      Social History   Socioeconomic History   Marital status: Widowed    Spouse name: Not on file   Number of children: 2   Years of education: Not on file   Highest education level: Not on file  Occupational History   Occupation: retired  Tobacco Use   Smoking status: Never   Smokeless tobacco: Never  Vaping Use   Vaping Use: Never used  Substance and Sexual Activity   Alcohol use: Not Currently    Comment: occ - hardly every    Drug use: No   Sexual activity: Not Currently    Partners: Male    Birth control/protection: Surgical  Other Topics Concern   Not on file  Social History Narrative   10/14/2019: Lives alone in apartment.    Has two sons, one in [place] and one in [place], 5 grandchildren    Has considered moving closer to sons but likes [place] too much.   Neighbors good social support   Enjoys watching soap operas, walking when she feels well, but recently struggling with more frequent episodes of pancreatitis requiring hospitalization.   Keeps positive attitude, "one day at a time"      Social Determinants of Health   Financial Resource Strain: Low Risk  (11/28/2021)   Overall Financial Resource Strain (CARDIA)    Difficulty of Paying Living Expenses: Not hard at all  Food Insecurity: No Food Insecurity (10/05/2022)   Hunger Vital Sign  Worried About Charity fundraiser in the Last Year: Never true    [place] in the Last Year: Never true  Transportation Needs: No Transportation Needs (10/05/2022)   PRAPARE - Hydrologist (Medical): No    Lack of Transportation (Non-Medical): No  Physical Activity: Inactive (11/28/2021)   Exercise Vital Sign    Days of Exercise per Week: 0 days    Minutes of Exercise per Session: 0 min  Stress: No Stress Concern Present (11/28/2021)   [place]    Feeling of  Stress : Only a little  Social Connections: Moderately Isolated (11/23/2020)   Social Connection and Isolation Panel [NHANES]    Frequency of Communication with Friends and Family: More than three times a week    Frequency of Social Gatherings with Friends and Family: Never    Attends Religious Services: Never    Marine scientist or Organizations: Yes    Attends Archivist Meetings: Never    Marital Status: Widowed  Intimate Partner Violence: Not At Risk (10/01/2022)   Humiliation, Afraid, Rape, and Kick questionnaire    Fear of Current or Ex-Partner: No    Emotionally Abused: No    Physically Abused: No    Sexually Abused: No    Family History  Problem Relation Age of Onset   Alcohol abuse Mother    Prostate cancer Father    Stroke Maternal Aunt    Hiatal hernia Maternal Aunt    Prostate cancer Paternal Grandfather    Cerebral palsy Granddaughter    Breast cancer Neg Hx     Current Outpatient Medications on File Prior to Visit  Medication Sig Dispense Refill   fluconazole (DIFLUCAN) 100 MG tablet Take 1 tablet (100 mg total) by mouth daily as needed. (Patient taking differently: Take 100 mg by mouth daily as needed (yeast infection).) 20 tablet 1   HYDROcodone-acetaminophen (NORCO/VICODIN) 5-325 MG tablet Take 1-2 tablets by mouth every 6 (six) hours as needed for moderate pain or severe pain. 12 tablet 0   losartan (COZAAR) 100 MG tablet Take 1 tablet (100 mg total) by mouth daily. 90 tablet 3   ondansetron (ZOFRAN-ODT) 4 MG disintegrating tablet Take 2 tablets every 8 hours as needed for nausea/vomiting 60 tablet 1   pantoprazole (PROTONIX) 40 MG tablet Take 1 tablet (40 mg total) by mouth daily. 90 tablet 3   sertraline (ZOLOFT) 100 MG tablet TAKE ONE TABLET BY MOUTH EVERY DAY 90 tablet 3   Vibegron 75 MG TABS Take 1 tablet by mouth daily at 6 (six) AM.     vitamin B-12 (CYANOCOBALAMIN) 100 MCG tablet Take 100 mcg by mouth daily.     zolpidem (AMBIEN) 5 MG  tablet Take 1 tablet (5 mg total) by mouth at bedtime as needed for sleep. 20 tablet 0   No current facility-administered medications on file prior to visit.    Allergies  Allergen Reactions   Other Hives and Swelling    AQUASONIC [place] GEL   Buprenorphine Hcl    Codeine     GI upset   Dilaudid [Hydromorphone Hcl] Itching   Morphine And Related     Ants crawling on skin       Physical Exam There were no vitals filed for this visit. Estimated body mass index is 43.89 kg/m as calculated from the following:   Height as of 10/11/22: 5\' 1"  (1.549 m).  Weight as of 10/11/22: 232 lb 4.8 oz (105.4 kg).  EKG (optional): deferred due to virtual visit  GENERAL: alert, oriented, no acute distress detected, full vision exam deferred due to pandemic and/or virtual encounter  PSYCH/NEURO: pleasant and cooperative, no obvious depression or anxiety, speech and thought processing grossly intact, Cognitive function grossly intact  Flowsheet Row Video Visit from 11/30/2022 in [facility]  PHQ-9 Total Score 0           11/30/2022    3:35 PM 08/22/2022    9:47 AM 11/28/2021    1:43 PM 08/17/2021   10:58 AM 11/23/2020    2:20 PM  Depression screen PHQ 2/9  Decreased Interest 0 0 0 0 0  Down, Depressed, Hopeless 0 0 0 0 0  PHQ - 2 Score 0 0 0 0 0  Altered sleeping 0 0     Tired, decreased energy 0 1     Change in appetite 0 0     Feeling bad or failure about yourself  0 0     Trouble concentrating 0 0     Moving slowly or fidgety/restless 0 0     Suicidal thoughts 0 0     PHQ-9 Score 0 1     Difficult doing work/chores  Not difficult at all          08/17/2021   10:58 AM 11/24/2021   11:15 AM 11/28/2021    1:41 PM 10/11/2022    1:47 PM 11/30/2022    3:35 PM  Fall Risk  Falls in the past year? 0 1 1 0 0  Was there an injury with Fall?  0 0 0 0  Fall Risk Category Calculator  1 1 0 0  Fall Risk Category (Retired)  Low Low    (RETIRED) Patient Fall Risk  Level   Low fall risk    Patient at Risk for Falls Due to   Medication side effect No Fall Risks No Fall Risks  Fall risk Follow up   Falls evaluation completed;Education provided;Falls prevention discussed Falls evaluation completed Falls evaluation completed     SUMMARY AND PLAN:  Encounter for Medicare annual wellness exam)   Discussed applicable health maintenance/preventive health measures and advised and referred or ordered per patient preferences:  Health Maintenance  Topic Date Due   DTaP/Tdap/Td (2 - Tdap) 04/01/2020, plans to get this at the pharmacy   COVID-19 Vaccine (4 - 2023-24 season) 05/05/2022, had her's in the fall   Medicare Annual Wellness ([place])  11/29/2022   COLONOSCOPY (Pts 45-32yrs Insurance coverage will need to be confirmed)  08/23/2023 (Originally 11/03/2019) -    MAMMOGRAM  08/15/2023   Pneumonia Vaccine 36+ Years old  Completed   INFLUENZA VACCINE  Completed   DEXA SCAN  Completed - in 2022, declined for now   Hepatitis C Screening  Completed   Zoster Vaccines- Shingrix  Completed   HPV VACCINES  Aged Out    Education and counseling on the following was provided based on the above review of health and a plan/checklist for the patient, along with additional information discussed, was provided for the patient in the patient instructions :  -Advised and counseled on maintaining healthy weight and healthy lifestyle - including the importance of a healthy diet, regular physical activity, social connections and stress management. -Advised and counseled on a whole foods based healthy diet and regular exercise. Encouraged to try to increase antioxidant fruits/veggies in diet - discussed healthy quick  options. A summary of a healthy diet was provided in the Patient Instructions. Recommended regular exercise, congratulated on current efforts, discussed guidelines. Brainstormed on easy ways she could get exercise at home even when her chronic knee issues flare up.    -Advised yearly dental visits at minimum and regular eye exams - Follow up: see patient instructions     Patient Instructions  I really enjoyed getting to talk with you today! I am available on Tuesdays and Thursdays for virtual visits if you have any questions or concerns, or if I can be of any further assistance.   CHECKLIST FROM ANNUAL WELLNESS VISIT:  -Follow up (please call to schedule if not scheduled after visit):  -yearly for annual wellness visit with primary care office  Here is a list of your preventive care/health maintenance measures and the plan for each if any are due:  Health Maintenance  Topic Date Due   DTaP/Tdap/Td (2 - Tdap) 04/01/2020 - can get at the pharmacy   COVID-19 Vaccine (5 - 2023-24 season) 12/16/2022 (Originally 06/30/2022)   COLONOSCOPY (Pts 45-42yrs Insurance coverage will need to be confirmed)  Originally 11/03/2019 - call the gastroenterologist to schedule as planned   MAMMOGRAM  08/15/2023   Medicare Annual Wellness ([facility])  11/30/2023   Pneumonia Vaccine 82+ Years old  Completed   INFLUENZA VACCINE  Completed   DEXA SCAN  Completed   Hepatitis C Screening  Completed   Zoster Vaccines- Shingrix  Completed   HPV VACCINES  Aged Out    -See a dentist at least yearly  -Get your eyes checked and then per your eye specialist's recommendations  -Other issues addressed today:  -I have included below further information regarding a healthy whole foods based diet, physical activity guidelines for adults, stress management and opportunities for social connections. I hope you find this information useful.   -----------------------------------------------------------------------------------------------------------------------------------------------------------------------------------------------------------------------------------------------------------  NUTRITION: -eat real food: lots of colorful vegetables (half the plate) and fruits -5-7 servings  of vegetables and fruits per day (fresh or steamed is best), exp. 2 servings of vegetables with lunch and dinner and 2 servings of fruit per day. Berries and greens such as kale and collards are great choices.  -consume on a regular basis: whole grains (make sure first ingredient on label contains the word "whole"), fresh fruits, fish, nuts, seeds, healthy oils (such as olive oil, avocado oil, grape seed oil) -may eat small amounts of dairy and lean meat on occasion, but avoid processed meats such as ham, bacon, lunch meat, etc. -drink water -try to avoid fast food and pre-packaged foods, processed meat -most experts advise limiting sodium to < 2300mg  per day, should limit further is any chronic conditions such as high blood pressure, heart disease, diabetes, etc. The American Heart Association advised that < 1500mg  is is ideal -try to avoid foods that contain any ingredients with names you do not recognize  -try to avoid sugar/sweets (except for the natural sugar that occurs in fresh fruit) -try to avoid sweet drinks -try to avoid white rice, white bread, pasta (unless whole grain), white or yellow potatoes  EXERCISE GUIDELINES FOR ADULTS: -if you wish to increase your physical activity, do so gradually and with the approval of your doctor -STOP and seek medical care immediately if you have any chest pain, chest discomfort or trouble breathing when starting or increasing exercise  -move and stretch your body, legs, feet and arms when sitting for long periods -Physical activity guidelines for optimal health in adults: -least 150  minutes per week of aerobic exercise (can talk, but not sing) once approved by your doctor, 20-30 minutes of sustained activity or two 10 minute episodes of sustained activity every day.  -resistance training at least 2 days per week if approved by your doctor -balance exercises 3+ days per week:   Stand somewhere where you have something sturdy to hold onto if you lose  balance.    1) lift up on toes, start with 5x per day and work up to 20x   2) stand and lift on leg straight out to the side so that foot is a few inches of the floor, start with 5x each side and work up to 20x each side   3) stand on one foot, start with 5 seconds each side and work up to 20 seconds on each side  If you need ideas or help with getting more active:  -Silver sneakers https://tools.silversneakers.com  -Walk with a Doc: http://stephens-thompson.biz/  -try to include resistance (weight lifting/strength building) and balance exercises twice per week: or the following link for ideas: ChessContest.fr  UpdateClothing.com.cy  STRESS MANAGEMENT: -can try meditating, or just sitting quietly with deep breathing while intentionally relaxing all parts of your body for 5 minutes daily -if you need further help with stress, anxiety or depression please follow up with your primary doctor or contact the wonderful folks at [facility]: [place]: -options in [place] if you wish to engage in more social and exercise related activities:  -Silver sneakers https://tools.silversneakers.com  -Walk with a Doc: http://stephens-thompson.biz/  -Check out the [place] 50+ section on the [employer] (hiking clubs, book clubs, cards and games, chess, exercise classes, aquatic classes and much more) - see the website for details: https://www.-[place].gov/departments/parks-recreation/active-adults50  -YouTube has lots of exercise videos for different ages and abilities as well  -[place] (a variety of indoor and outdoor inperson activities for adults). (352) 170-0873. [address]  -Virtual Online Classes (a variety of topics): see seniorplanet.org or call [phone]  -consider volunteering at a school, hospice  center, church, senior center or elsewhere           Lucretia Kern, DO

## 2022-11-30 NOTE — Patient Instructions (Addendum)
I really enjoyed getting to talk with you today! I am available on Tuesdays and Thursdays for virtual visits if you have any questions or concerns, or if I can be of any further assistance.   CHECKLIST FROM ANNUAL WELLNESS VISIT:  -Follow up (please call to schedule if not scheduled after visit):  -yearly for annual wellness visit with primary care office  Here is a list of your preventive care/health maintenance measures and the plan for each if any are due:  Health Maintenance  Topic Date Due   DTaP/Tdap/Td (2 - Tdap) 04/01/2020 - can get at the pharmacy   COVID-19 Vaccine (5 - 2023-24 season) 12/16/2022 (Originally 06/30/2022)   COLONOSCOPY (Pts 45-64yrs Insurance coverage will need to be confirmed)  Originally 11/03/2019 - call the gastroenterologist to schedule as planned   MAMMOGRAM  08/15/2023   Medicare Annual Wellness (AWV)  11/30/2023   Pneumonia Vaccine 51+ Years old  Completed   INFLUENZA VACCINE  Completed   DEXA SCAN  Completed   Hepatitis C Screening  Completed   Zoster Vaccines- Shingrix  Completed   HPV VACCINES  Aged Out    -See a dentist at least yearly  -Get your eyes checked and then per your eye specialist's recommendations  -Other issues addressed today:  -I have included below further information regarding a healthy whole foods based diet, physical activity guidelines for adults, stress management and opportunities for social connections. I hope you find this information useful.   -----------------------------------------------------------------------------------------------------------------------------------------------------------------------------------------------------------------------------------------------------------  NUTRITION: -eat real food: lots of colorful vegetables (half the plate) and fruits -5-7 servings of vegetables and fruits per day (fresh or steamed is best), exp. 2 servings of vegetables with lunch and dinner and 2 servings of fruit  per day. Berries and greens such as kale and collards are great choices.  -consume on a regular basis: whole grains (make sure first ingredient on label contains the word "whole"), fresh fruits, fish, nuts, seeds, healthy oils (such as olive oil, avocado oil, grape seed oil) -may eat small amounts of dairy and lean meat on occasion, but avoid processed meats such as ham, bacon, lunch meat, etc. -drink water -try to avoid fast food and pre-packaged foods, processed meat -most experts advise limiting sodium to < 2300mg  per day, should limit further is any chronic conditions such as high blood pressure, heart disease, diabetes, etc. The American Heart Association advised that < 1500mg  is is ideal -try to avoid foods that contain any ingredients with names you do not recognize  -try to avoid sugar/sweets (except for the natural sugar that occurs in fresh fruit) -try to avoid sweet drinks -try to avoid white rice, white bread, pasta (unless whole grain), white or yellow potatoes  EXERCISE GUIDELINES FOR ADULTS: -if you wish to increase your physical activity, do so gradually and with the approval of your doctor -STOP and seek medical care immediately if you have any chest pain, chest discomfort or trouble breathing when starting or increasing exercise  -move and stretch your body, legs, feet and arms when sitting for long periods -Physical activity guidelines for optimal health in adults: -least 150 minutes per week of aerobic exercise (can talk, but not sing) once approved by your doctor, 20-30 minutes of sustained activity or two 10 minute episodes of sustained activity every day.  -resistance training at least 2 days per week if approved by your doctor -balance exercises 3+ days per week:   Stand somewhere where you have something sturdy to hold onto if you lose  balance.    1) lift up on toes, start with 5x per day and work up to 20x   2) stand and lift on leg straight out to the side so that foot  is a few inches of the floor, start with 5x each side and work up to 20x each side   3) stand on one foot, start with 5 seconds each side and work up to 20 seconds on each side  If you need ideas or help with getting more active:  -Silver sneakers https://tools.silversneakers.com  -Walk with a Doc: http://stephens-thompson.biz/  -try to include resistance (weight lifting/strength building) and balance exercises twice per week: or the following link for ideas: ChessContest.fr  UpdateClothing.com.cy  STRESS MANAGEMENT: -can try meditating, or just sitting quietly with deep breathing while intentionally relaxing all parts of your body for 5 minutes daily -if you need further help with stress, anxiety or depression please follow up with your primary doctor or contact the wonderful folks at [place]: [place]: -options in [place] if you wish to engage in more social and exercise related activities:  -Silver sneakers https://tools.silversneakers.com  -Walk with a Doc: http://stephens-thompson.biz/  -Check out the [place] 50+ section on the [place] of [employer] (hiking clubs, book clubs, cards and games, chess, exercise classes, aquatic classes and much more) - see the website for details: https://www.[place].gov/departments/parks-recreation/active-adults50  -YouTube has lots of exercise videos for different ages and abilities as well  -[place] (a variety of indoor and outdoor inperson activities for adults). 205-039-3052. [address]  -Virtual Online Classes (a variety of topics): see seniorplanet.org or call [phone]  -consider volunteering at a school, hospice center, church, senior center or elsewhere

## 2022-12-04 ENCOUNTER — Ambulatory Visit: Payer: Medicare Other

## 2023-01-18 DIAGNOSIS — Z961 Presence of intraocular lens: Secondary | ICD-10-CM | POA: Diagnosis not present

## 2023-01-18 DIAGNOSIS — H52203 Unspecified astigmatism, bilateral: Secondary | ICD-10-CM | POA: Diagnosis not present

## 2023-02-21 ENCOUNTER — Ambulatory Visit (INDEPENDENT_AMBULATORY_CARE_PROVIDER_SITE_OTHER): Payer: Medicare Other | Admitting: Adult Health

## 2023-02-21 ENCOUNTER — Ambulatory Visit (INDEPENDENT_AMBULATORY_CARE_PROVIDER_SITE_OTHER): Payer: Medicare Other

## 2023-02-21 ENCOUNTER — Encounter: Payer: Self-pay | Admitting: Adult Health

## 2023-02-21 VITALS — BP 110/70 | HR 93 | Temp 98.4°F | Ht 61.0 in | Wt 239.0 lb

## 2023-02-21 DIAGNOSIS — G8929 Other chronic pain: Secondary | ICD-10-CM

## 2023-02-21 DIAGNOSIS — M25562 Pain in left knee: Secondary | ICD-10-CM

## 2023-02-21 DIAGNOSIS — M25561 Pain in right knee: Secondary | ICD-10-CM | POA: Diagnosis not present

## 2023-02-21 MED ORDER — DICLOFENAC SODIUM 1 % EX GEL: 2.0000 g | Freq: Four times a day (QID) | CUTANEOUS | 1 refills | Status: DC

## 2023-02-21 NOTE — Patient Instructions (Signed)
It was great seeing you today   We will get some xrays today and will follow up with you once we get your results back   I have sent in Volaren Gel for you to apply to the knees to give you some relief

## 2023-02-21 NOTE — Progress Notes (Signed)
Subjective:    Patient ID: Linda Blackburn, female    DOB: 06/21/1951, 72 y.o.   MRN: [id number]  Leg Pain    72 year old female who  has a past medical history of Abnormal uterine bleeding, Amenorrhea, Anxiety, Depression, Gallstones, Hay fever, HTN (hypertension), Pancreatitis, Urine incontinence, and UTI (urinary tract infection).  She is a patient of Dr. Caryl Never who I am seeing today for chronic bilateral knee pain ( R>L)  that has become worse over the last two months. She states " when I get out of the bed in the morning I can barely walk. The pain continues throughout the day. Weight bearing exacerbates the pain. Pain is felt as an ache and this pain can radiates down her leg from her knee.   She had a an xray done by Dr. Clementeen Graham back in 2021 which showed Minimal tricompartmental right knee osteoarthritis. She went through PT and this helped.   She took a Norco she had at home and this helped alleviate the pain but did not get rid of it.    Review of Systems See HPI   Past Medical History:  Diagnosis Date   Abnormal uterine bleeding    Amenorrhea    Anxiety    Depression    Gallstones    Hay fever    HTN (hypertension)    Pancreatitis    Urine incontinence    UTI (urinary tract infection)    reoccuring     Social History   Socioeconomic History   Marital status: Widowed    Spouse name: Not on file   Number of children: 2   Years of education: Not on file   Highest education level: Some college, no degree  Occupational History   Occupation: retired  Tobacco Use   Smoking status: Never   Smokeless tobacco: Never  Vaping Use   Vaping Use: Never used  Substance and Sexual Activity   Alcohol use: Not Currently    Comment: occ - hardly every    Drug use: No   Sexual activity: Not Currently    Partners: Male    Birth control/protection: Surgical  Other Topics Concern   Not on file  Social History Narrative   10/14/2019: Lives alone in apartment.     Has two sons, one in [place] and one in [place], 5 grandchildren    Has considered moving closer to sons but likes [place] too much.   Neighbors good social support   Enjoys watching soap operas, walking when she feels well, but recently struggling with more frequent episodes of pancreatitis requiring hospitalization.   Keeps positive attitude, "one day at a time"      Social Determinants of Health   Financial Resource Strain: Low Risk  (02/20/2023)   Overall Financial Resource Strain (CARDIA)    Difficulty of Paying Living Expenses: Not hard at all  Food Insecurity: No Food Insecurity (02/20/2023)   Hunger Vital Sign    Worried About Running Out of Food in the Last Year: Never true    Ran Out of Food in the Last Year: Never true  Transportation Needs: No Transportation Needs (02/20/2023)   PRAPARE - Administrator, Civil Service (Medical): No    Lack of Transportation (Non-Medical): No  Physical Activity: Insufficiently Active (02/20/2023)   Exercise Vital Sign    Days of Exercise per Week: 3 days    Minutes of Exercise per Session: 10 min  Stress: Stress Concern Present (02/20/2023)  [employer] of Occupational Health - Occupational Stress Questionnaire    Feeling of Stress : To some extent  Social Connections: Moderately Isolated (02/20/2023)   Social Connection and Isolation Panel [NHANES]    Frequency of Communication with Friends and Family: More than three times a week    Frequency of Social Gatherings with Friends and Family: More than three times a week    Attends Religious Services: 1 to 4 times per year    Active Member of [employer] or Organizations: No    Attends Banker Meetings: Not on file    Marital Status: Widowed  Intimate Partner Violence: Not At Risk (10/01/2022)   Humiliation, Afraid, Rape, and Kick questionnaire    Fear of Current or Ex-Partner: No    Emotionally Abused: No    Physically Abused: No    Sexually Abused: No    Past Surgical  History:  Procedure Laterality Date   ABDOMINAL HYSTERECTOMY  1984   fibroids   APPENDECTOMY  1984   BILIARY DILATION  05/29/2019   Procedure: BILIARY DILATION;  Surgeon: Meridee Score Netty Starring., MD;  Location: [facility];  Service: Gastroenterology;;   BIOPSY  05/29/2019   Procedure: BIOPSY;  Surgeon: Lemar Lofty., MD;  Location: [facility];  Service: Gastroenterology;;   CATARACT EXTRACTION Right 11/22/2021   CHOLECYSTECTOMY  09/08/1999   ERCP N/A 05/29/2019   Procedure: ENDOSCOPIC RETROGRADE CHOLANGIOPANCREATOGRAPHY (ERCP);  Surgeon: Lemar Lofty., MD;  Location: [facility];  Service: Gastroenterology;  Laterality: N/A;   ERCP W/ SPHINCTEROTOMY AND BALLOON DILATION  08/2008   ESOPHAGOGASTRODUODENOSCOPY (EGD) WITH PROPOFOL N/A 05/29/2019   Procedure: ESOPHAGOGASTRODUODENOSCOPY (EGD) WITH PROPOFOL;  Surgeon: Meridee Score Netty Starring., MD;  Location: [facility];  Service: Gastroenterology;  Laterality: N/A;   OOPHORECTOMY     Only 1 was taken   POLYPECTOMY  05/29/2019   Procedure: POLYPECTOMY;  Surgeon: Lemar Lofty., MD;  Location: [facility];  Service: Gastroenterology;;   REMOVAL OF STONES  05/29/2019   Procedure: REMOVAL OF STONES;  Surgeon: Lemar Lofty., MD;  Location: [facility];  Service: Gastroenterology;;   TONSILLECTOMY     TUBAL LIGATION      Family History  Problem Relation Age of Onset   Alcohol abuse Mother    Prostate cancer Father    Stroke Maternal Aunt    Hiatal hernia Maternal Aunt    Prostate cancer Paternal Grandfather    Cerebral palsy Granddaughter    Breast cancer Neg Hx     Allergies  Allergen Reactions   Other Hives and Swelling    AQUASONIC [place] GEL   Buprenorphine Hcl    Codeine     GI upset   Dilaudid [Hydromorphone Hcl] Itching   Morphine And Codeine     Ants crawling on skin    Current Outpatient Medications on File Prior to Visit  Medication Sig Dispense Refill   fluconazole (DIFLUCAN)  100 MG tablet Take 1 tablet (100 mg total) by mouth daily as needed. (Patient taking differently: Take 100 mg by mouth daily as needed (yeast infection).) 20 tablet 1   HYDROcodone-acetaminophen (NORCO/VICODIN) 5-325 MG tablet Take 1-2 tablets by mouth every 6 (six) hours as needed for moderate pain or severe pain. 12 tablet 0   losartan (COZAAR) 100 MG tablet Take 1 tablet (100 mg total) by mouth daily. 90 tablet 3   ondansetron (ZOFRAN-ODT) 4 MG disintegrating tablet Take 2 tablets every 8 hours as needed for nausea/vomiting 60 tablet 1   pantoprazole (PROTONIX) 40  MG tablet Take 1 tablet (40 mg total) by mouth daily. 90 tablet 3   sertraline (ZOLOFT) 100 MG tablet TAKE ONE TABLET BY MOUTH EVERY DAY 90 tablet 3   Vibegron 75 MG TABS Take 1 tablet by mouth daily at 6 (six) AM.     vitamin B-12 (CYANOCOBALAMIN) 100 MCG tablet Take 100 mcg by mouth daily.     zolpidem (AMBIEN) 5 MG tablet Take 1 tablet (5 mg total) by mouth at bedtime as needed for sleep. 20 tablet 0   No current facility-administered medications on file prior to visit.    BP 110/70   Pulse 93   Temp 98.4 F (36.9 C) (Oral)   Ht 5\' 1"  (1.549 m)   Wt 239 lb (108.4 kg)   SpO2 97%   BMI 45.16 kg/m       Objective:   Physical Exam Vitals and nursing note reviewed.  Constitutional:      Appearance: Normal appearance. She is obese.  Musculoskeletal:        General: Tenderness present.     Right knee: Swelling and bony tenderness present. No erythema. Decreased range of motion. Normal meniscus and normal patellar mobility.     Left knee: Bony tenderness present. No erythema. Decreased range of motion. Normal meniscus and normal patellar mobility.     Right lower leg: No edema.     Left lower leg: No edema.  Skin:    General: Skin is warm.     Capillary Refill: Capillary refill takes less than 2 seconds.  Neurological:     General: No focal deficit present.     Mental Status: She is alert and oriented to person,  place, and time.  Psychiatric:        Mood and Affect: Mood normal.        Behavior: Behavior normal.        Thought Content: Thought content normal.        Judgment: Judgment normal.        Assessment & Plan:  1. Chronic pain of right knee - No signs of DVT. Likely worsening osteoarthritis  - Will repeat Xrays today.  - Consider steroid injections or referral to orthopedics - DG Knee 1-2 Views Right; Future - diclofenac Sodium (VOLTAREN ARTHRITIS PAIN) 1 % GEL; Apply 2 g topically 4 (four) times daily.  Dispense: 150 g; Refill: 1  2. Chronic pain of left knee -  - DG Knee 1-2 Views Left; Future - diclofenac Sodium (VOLTAREN ARTHRITIS PAIN) 1 % GEL; Apply 2 g topically 4 (four) times daily.  Dispense: 150 g; Refill: 1  Shirline Frees, NP

## 2023-02-27 ENCOUNTER — Telehealth: Payer: Self-pay | Admitting: Family Medicine

## 2023-02-27 NOTE — Telephone Encounter (Signed)
Pt returned call for imaging results, saw Nafziger 02/21/23

## 2023-02-28 DIAGNOSIS — N3946 Mixed incontinence: Secondary | ICD-10-CM | POA: Diagnosis not present

## 2023-02-28 DIAGNOSIS — R350 Frequency of micturition: Secondary | ICD-10-CM | POA: Diagnosis not present

## 2023-02-28 NOTE — Telephone Encounter (Signed)
Patient notified of update  and verbalized understanding. 

## 2023-03-07 ENCOUNTER — Ambulatory Visit (INDEPENDENT_AMBULATORY_CARE_PROVIDER_SITE_OTHER): Payer: Medicare Other | Admitting: Family Medicine

## 2023-03-07 ENCOUNTER — Encounter: Payer: Self-pay | Admitting: Family Medicine

## 2023-03-07 VITALS — BP 144/80 | HR 80 | Temp 97.6°F | Ht 61.0 in

## 2023-03-07 DIAGNOSIS — G8929 Other chronic pain: Secondary | ICD-10-CM | POA: Diagnosis not present

## 2023-03-07 DIAGNOSIS — M25561 Pain in right knee: Secondary | ICD-10-CM | POA: Diagnosis not present

## 2023-03-07 DIAGNOSIS — M1711 Unilateral primary osteoarthritis, right knee: Secondary | ICD-10-CM | POA: Diagnosis not present

## 2023-03-07 MED ORDER — METHYLPREDNISOLONE ACETATE 40 MG/ML IJ SUSP
40.0000 mg | Freq: Once | INTRAMUSCULAR | Status: AC
  Administered 2023-03-07: 40 mg via INTRAMUSCULAR

## 2023-03-07 NOTE — Progress Notes (Unsigned)
Established Patient Office Visit  Subjective   Patient ID: Linda Blackburn, female    DOB: June 09, 1951  Age: 72 y.o. MRN: [id number]  Chief Complaint  Patient presents with   Knee Pain    Patient complains of right knee pain, x2 months,    Foot Pain    Patient complains of heel pain of left foot     HPI  {History (Optional):23778} Linda Blackburn is here with some ongoing right knee pains for at least couple months.  She has some left knee pain as well but much worse on the right.  Denies any specific injury.  Has been taking some Advil twice daily.  Was seen here recently and x-rays were done of both knees and showed some tricompartmental osteoarthritis changes.  She feels like if anything her right knee effusion is somewhat improved today.  She has tried topical diclofenac but had rash related to this.  She has somewhat poorly localized achy type pain worse medial compartment.  Very sedentary.  She is also having some pain radiating posterior and has some swelling popliteal region.  Has not noted any erythema or warmth.  No locking or giving way of the knee.  She had physical therapy once previously which seemed to help.  She is inquiring about possible knee injection today.  She has tried plain Tylenol previously without much relief  Past Medical History:  Diagnosis Date   Abnormal uterine bleeding    Amenorrhea    Anxiety    Depression    Gallstones    Hay fever    HTN (hypertension)    Pancreatitis    Urine incontinence    UTI (urinary tract infection)    reoccuring    Past Surgical History:  Procedure Laterality Date   ABDOMINAL HYSTERECTOMY  1984   fibroids   APPENDECTOMY  1984   BILIARY DILATION  05/29/2019   Procedure: BILIARY DILATION;  Surgeon: Lemar Lofty., MD;  Location: [facility];  Service: Gastroenterology;;   BIOPSY  05/29/2019   Procedure: BIOPSY;  Surgeon: Lemar Lofty., MD;  Location: [facility];  Service: Gastroenterology;;    CATARACT EXTRACTION Right 11/22/2021   CHOLECYSTECTOMY  09/08/1999   ERCP N/A 05/29/2019   Procedure: ENDOSCOPIC RETROGRADE CHOLANGIOPANCREATOGRAPHY (ERCP);  Surgeon: Lemar Lofty., MD;  Location: [facility];  Service: Gastroenterology;  Laterality: N/A;   ERCP W/ SPHINCTEROTOMY AND BALLOON DILATION  08/2008   ESOPHAGOGASTRODUODENOSCOPY (EGD) WITH PROPOFOL N/A 05/29/2019   Procedure: ESOPHAGOGASTRODUODENOSCOPY (EGD) WITH PROPOFOL;  Surgeon: Meridee Score Netty Starring., MD;  Location: [facility];  Service: Gastroenterology;  Laterality: N/A;   OOPHORECTOMY     Only 1 was taken   POLYPECTOMY  05/29/2019   Procedure: POLYPECTOMY;  Surgeon: Mansouraty, Netty Starring., MD;  Location: [facility];  Service: Gastroenterology;;   REMOVAL OF STONES  05/29/2019   Procedure: REMOVAL OF STONES;  Surgeon: Lemar Lofty., MD;  Location: [facility];  Service: Gastroenterology;;   TONSILLECTOMY     TUBAL LIGATION      reports that she has never smoked. She has never used smokeless tobacco. She reports that she does not currently use alcohol. She reports that she does not use drugs. family history includes Alcohol abuse in her mother; Cerebral palsy in her granddaughter; Hiatal hernia in her maternal aunt; Prostate cancer in her father and paternal grandfather; Stroke in her maternal aunt. Allergies  Allergen Reactions   Other Hives and Swelling    AQUASONIC [place] GEL   Buprenorphine Hcl  Codeine     GI upset   Dilaudid [Hydromorphone Hcl] Itching   Morphine And Codeine     Ants crawling on skin   Diclofenac Sodium Rash    Per pt rash behind knee     Review of Systems  Constitutional:  Negative for chills and fever.  Neurological:  Negative for focal weakness.      Objective:     BP (!) 144/80 (BP Location: Left Arm, Patient Position: Sitting, Cuff Size: Large)   Pulse 80   Temp 97.6 F (36.4 C) (Oral)   Ht 5\' 1"  (1.549 m)   SpO2 97%   BMI 45.16 kg/m  {Vitals History  (Optional):23777}  Physical Exam Vitals reviewed.  Constitutional:      Appearance: Normal appearance.  Cardiovascular:     Rate and Rhythm: Normal rate and regular rhythm.  Pulmonary:     Effort: Pulmonary effort is normal.     Breath sounds: Normal breath sounds.  Musculoskeletal:     Comments: Right knee reveals good range of motion.  No significant crepitus.  No definite effusion.  She has some tenderness over the medial compartment.  No ecchymosis.  No erythema.  Neurological:     Mental Status: She is alert.      No results found for any visits on 03/07/23.  {Labs (Optional):23779}  The ASCVD Risk score (Arnett DK, et al., 2019) failed to calculate for the following reasons:   Cannot find a previous HDL lab   Cannot find a previous total cholesterol lab    Assessment & Plan:   Problem List Items Addressed This Visit   None Visit Diagnoses     Chronic pain of right knee    -  Primary   Relevant Medications   methylPREDNISolone acetate (DEPO-MEDROL) injection 40 mg (Completed)     Patient has progressive right knee pain with no recent injury.  Recent x-ray showing mild tricompartmental degenerative changes.  She tried diclofenac gel but had rash.  She has tried Tylenol without much improvement.  Not a good candidate for chronic nonsteroidals.  -Discussed importance of regular exercise especially for quadricep strengthening.  She had difficulty riding a regular exercise bike in the past but has not tried a recumbent bike.  She had difficulty with elliptical as well. -Set up physical therapy which has benefited her in the past -Continue Tylenol as needed -We discussed risk and benefits of cortisone injection including risk of bleeding, bruising, low risk of infection and patient consented.  Right knee was prepped with Betadine.  Using sterile technique we injected 40 mg of Depo-Medrol and 2 cc of plain Xylocaine using 23-gauge 1-1/2 inch needle.  Patient tolerated well.   She will try some icing at night. -We also discussed the importance of her trying to lose some weight in  chronic management.  No follow-ups on file.    [name], MD

## 2023-03-07 NOTE — Patient Instructions (Signed)
We will set up PT, as discussed  Consider icing knee 15- 20 minutes two to three times daily for next couple of days.

## 2023-03-19 ENCOUNTER — Ambulatory Visit (INDEPENDENT_AMBULATORY_CARE_PROVIDER_SITE_OTHER): Payer: Medicare Other | Admitting: Physical Therapy

## 2023-03-19 DIAGNOSIS — G8929 Other chronic pain: Secondary | ICD-10-CM | POA: Diagnosis not present

## 2023-03-19 DIAGNOSIS — M5459 Other low back pain: Secondary | ICD-10-CM

## 2023-03-19 DIAGNOSIS — M79604 Pain in right leg: Secondary | ICD-10-CM

## 2023-03-19 DIAGNOSIS — M25561 Pain in right knee: Secondary | ICD-10-CM

## 2023-03-19 NOTE — Therapy (Unsigned)
OUTPATIENT PHYSICAL THERAPY LOWER EXTREMITY EVALUATION   Patient Name: Linda Blackburn MRN: [id number] DOB:04-May-1951, 72 y.o., female Today's Date: 03/19/2023  END OF SESSION:   Past Medical History:  Diagnosis Date   Abnormal uterine bleeding    Amenorrhea    Anxiety    Depression    Gallstones    Hay fever    HTN (hypertension)    Pancreatitis    Urine incontinence    UTI (urinary tract infection)    reoccuring    Past Surgical History:  Procedure Laterality Date   ABDOMINAL HYSTERECTOMY  1984   fibroids   APPENDECTOMY  1984   BILIARY DILATION  05/29/2019   Procedure: BILIARY DILATION;  Surgeon: Lemar Lofty., MD;  Location: [facility];  Service: Gastroenterology;;   BIOPSY  05/29/2019   Procedure: BIOPSY;  Surgeon: Lemar Lofty., MD;  Location: [facility];  Service: Gastroenterology;;   CATARACT EXTRACTION Right 11/22/2021   CHOLECYSTECTOMY  09/08/1999   ERCP N/A 05/29/2019   Procedure: ENDOSCOPIC RETROGRADE CHOLANGIOPANCREATOGRAPHY (ERCP);  Surgeon: Lemar Lofty., MD;  Location: [facility];  Service: Gastroenterology;  Laterality: N/A;   ERCP W/ SPHINCTEROTOMY AND BALLOON DILATION  08/2008   ESOPHAGOGASTRODUODENOSCOPY (EGD) WITH PROPOFOL N/A 05/29/2019   Procedure: ESOPHAGOGASTRODUODENOSCOPY (EGD) WITH PROPOFOL;  Surgeon: Meridee Score Netty Starring., MD;  Location: [facility];  Service: Gastroenterology;  Laterality: N/A;   OOPHORECTOMY     Only 1 was taken   POLYPECTOMY  05/29/2019   Procedure: POLYPECTOMY;  Surgeon: Mansouraty, Netty Starring., MD;  Location: [facility];  Service: Gastroenterology;;   REMOVAL OF STONES  05/29/2019   Procedure: REMOVAL OF STONES;  Surgeon: Lemar Lofty., MD;  Location: [facility];  Service: Gastroenterology;;   TONSILLECTOMY     TUBAL LIGATION     Patient Active Problem List   Diagnosis Date Noted   Acute on chronic pancreatitis (HCC) 09/30/2022   Acute pancreatitis 01/21/2020    Gastroesophageal reflux disease    Basal cell carcinoma (BCC) in situ of skin 10/11/2018   Stress incontinence 10/11/2018   Hypotension (arterial) 08/09/2018   Sinus bradycardia on ECG 08/09/2018   AKI (acute kidney injury) (HCC) 08/09/2018   Recurrent acute pancreatitis 08/08/2018   Nausea & vomiting 03/07/2018   Sepsis (HCC) 01/01/2018   Alternating constipation and diarrhea 09/26/2016   Obesity (BMI 30-39.9) 07/20/2014   Chronic insomnia 06/16/2014   Hypokalemia 06/10/2014   Volume overload 06/10/2014   UTI (urinary tract infection) 06/10/2014   Leukocytosis 06/10/2014   Acute respiratory failure with hypoxia (HCC) 06/10/2014   Acute recurrent pancreatitis 06/06/2014   Pancreatitis 06/06/2014   HTN (hypertension) 09/11/2012   Adjustment disorder with mixed anxiety and depressed mood 09/11/2012   Pancreas divisum 01/18/2011   URI 09/02/2010   Depression, recurrent (HCC) 04/01/2010   PANCREATITIS, HX OF 04/01/2010       PCP: ***  REFERRING PROVIDER: ***  REFERRING DIAG: ***  THERAPY DIAG:  No diagnosis found.  Rationale for Evaluation and Treatment: Rehabilitation  ONSET DATE: ***    SUBJECTIVE:   SUBJECTIVE STATEMENT: 3 mo ago, increased pain in R knee. Has had pain I nthe past with good outcome with PT.  Had cortisone shot a couple week ago (1st injection)  helped quite a bit. Still having some soreness in back of knee, feels like a pulling. Most pain still in the back,  Sciatic pain?  Some leg pain down each leg at times.  Lives alone/widow.  Stairs: No, lives on 1st floor.  PERTINENT HISTORY: ***  PAIN:  Are you having pain? Yes: NPRS scale: ***/10 Pain location: R knee Pain description: *** Aggravating factors: *** Relieving factors: ***  PRECAUTIONS: {Therapy precautions:24002}  WEIGHT BEARING RESTRICTIONS: No  FALLS:  Has patient fallen in last 6 months? {fallsyesno:27318}   PLOF: Independent  PATIENT GOALS:  Decreased pain    NEXT MD VISIT:   OBJECTIVE:   DIAGNOSTIC FINDINGS:   PATIENT SURVEYS:    COGNITION: Overall cognitive status: Within functional limits for tasks assessed     SENSATION: WFL  EDEMA:    POSTURE:    No Significant postural limitations  PALPATION:   LOWER EXTREMITY ROM:  Active /Passive ROM Left eval Right eval  Hip flexion    Hip extension    Hip abduction    Hip adduction    Hip internal rotation    Hip external rotation    Knee flexion    Knee extension    Ankle dorsiflexion    Ankle plantarflexion    Ankle inversion    Ankle eversion     (Blank rows = not tested)  LOWER EXTREMITY MMT:  MMT Left eval Right  eval  Hip flexion    Hip extension    Hip abduction    Hip adduction    Hip internal rotation    Hip external rotation    Knee flexion    Knee extension    Ankle dorsiflexion    Ankle plantarflexion    Ankle inversion    Ankle eversion     (Blank rows = not tested)  LOWER EXTREMITY SPECIAL TESTS:  {LEspecialtests:26242}  FUNCTIONAL TESTS:  {Functional tests:24029}  GAIT: Distance walked: *** Assistive device utilized: {Assistive devices:23999} Level of assistance: {Levels of assistance:24026} Comments: ***   TODAY'S TREATMENT:                                                                                                                              DATE:     PATIENT EDUCATION:  Education details: PT POC, Exam findings, HEP Person educated: Patient Education method: Programmer, multimedia, Demonstration, Actor cues, Verbal cues, and Handouts Education comprehension: verbalized understanding, returned demonstration, verbal cues required, tactile cues required, and needs further education   HOME EXERCISE PROGRAM: Access Code: [email or web address] URL: [email or web address] Date: 03/19/2023 Prepared by: Sedalia Muta  Exercises - Seated Hamstring Stretch  - 2 x daily - 3 reps - 30 hold - Gastroc Stretch on Wall  - 2 x daily -  3 reps - 20 hold - Supine Lower Trunk Rotation  - 1 x daily - 10 reps - 10 hold - Supine Posterior Pelvic Tilt  - 2 x daily - 2 sets - 10 reps    ASSESSMENT:  CLINICAL IMPRESSION: Patient presents with primary complaint of  ***   Pt with decreased ability for full functional activities. Pt will  benefit from skilled PT to improve deficits and pain and to return to PLOF.  OBJECTIVE IMPAIRMENTS: {opptimpairments:25111}.   ACTIVITY LIMITATIONS: {activitylimitations:27494}  PARTICIPATION LIMITATIONS: {participationrestrictions:25113}  PERSONAL FACTORS: {Personal factors:25162} are also affecting patient's functional outcome.   REHAB POTENTIAL: {rehabpotential:25112}  CLINICAL DECISION MAKING: {clinical decision making:25114}  EVALUATION COMPLEXITY: {Evaluation complexity:25115}   GOALS: Goals reviewed with patient? Yes  SHORT TERM GOALS: Target date: ***  ***  Goal status: INITIAL  2.  ***  Goal status: INITIAL  3.  ***  Goal status: INITIAL    LONG TERM GOALS: Target date: ***  ***  Goal status: INITIAL  2.  ***  Goal status: INITIAL  3.  ***  Goal status: INITIAL  4.  *** Baseline:  Goal status: INITIAL  5.  ***  Goal status: INITIAL   PLAN:  PT FREQUENCY: 1-2x/week  PT DURATION: 8 weeks  PLANNED INTERVENTIONS: Therapeutic exercises, Therapeutic activity, Neuromuscular re-education, Patient/Family education, Self Care, Joint mobilization, Joint manipulation, Stair training, Orthotic/Fit training, DME instructions, Aquatic Therapy, Dry Needling, Electrical stimulation, Cryotherapy, Moist heat, Taping, Ultrasound, Ionotophoresis 4mg /ml Dexamethasone, Manual therapy,  Vasopneumatic device, Traction, Spinal manipulation, Spinal mobilization,Balance training, Gait training,   PLAN FOR NEXT SESSION:   Sedalia Muta, PT, DPT 3:26 PM  03/19/23

## 2023-03-20 ENCOUNTER — Encounter: Payer: Self-pay | Admitting: Physical Therapy

## 2023-03-26 ENCOUNTER — Encounter: Payer: Self-pay | Admitting: Physical Therapy

## 2023-03-26 ENCOUNTER — Ambulatory Visit (INDEPENDENT_AMBULATORY_CARE_PROVIDER_SITE_OTHER): Payer: Medicare Other | Admitting: Physical Therapy

## 2023-03-26 DIAGNOSIS — M79604 Pain in right leg: Secondary | ICD-10-CM

## 2023-03-26 DIAGNOSIS — M5459 Other low back pain: Secondary | ICD-10-CM | POA: Diagnosis not present

## 2023-03-26 DIAGNOSIS — G8929 Other chronic pain: Secondary | ICD-10-CM

## 2023-03-26 DIAGNOSIS — M25561 Pain in right knee: Secondary | ICD-10-CM

## 2023-03-26 NOTE — Therapy (Signed)
OUTPATIENT PHYSICAL THERAPY LOWER EXTREMITY Treatment    Patient Name: Linda Blackburn MRN: [id number] DOB:[birth year], 72 y.o., female Today's Date: 03/26/2023  END OF SESSION:  PT End of Session - 03/26/23 1505     Visit Number 2    Number of Visits 16    Date for PT Re-Evaluation 05/14/23    Authorization Type Medicare    PT Start Time 1508    PT Stop Time 1553    PT Time Calculation (min) 45 min    Activity Tolerance Patient tolerated treatment well    Behavior During Therapy WFL for tasks assessed/performed             Past Medical History:  Diagnosis Date   Abnormal uterine bleeding    Amenorrhea    Anxiety    Depression    Gallstones    Hay fever    HTN (hypertension)    Pancreatitis    Urine incontinence    UTI (urinary tract infection)    reoccuring    Past Surgical History:  Procedure Laterality Date   ABDOMINAL HYSTERECTOMY  1984   fibroids   APPENDECTOMY  1984   BILIARY DILATION  05/29/2019   Procedure: BILIARY DILATION;  Surgeon: Lemar Lofty., MD;  Location: [facility];  Service: Gastroenterology;;   BIOPSY  05/29/2019   Procedure: BIOPSY;  Surgeon: Lemar Lofty., MD;  Location: [facility];  Service: Gastroenterology;;   CATARACT EXTRACTION Right 11/22/2021   CHOLECYSTECTOMY  09/08/1999   ERCP N/A 05/29/2019   Procedure: ENDOSCOPIC RETROGRADE CHOLANGIOPANCREATOGRAPHY (ERCP);  Surgeon: Lemar Lofty., MD;  Location: [facility];  Service: Gastroenterology;  Laterality: N/A;   ERCP W/ SPHINCTEROTOMY AND BALLOON DILATION  08/2008   ESOPHAGOGASTRODUODENOSCOPY (EGD) WITH PROPOFOL N/A 05/29/2019   Procedure: ESOPHAGOGASTRODUODENOSCOPY (EGD) WITH PROPOFOL;  Surgeon: Meridee Score Netty Starring., MD;  Location: [facility];  Service: Gastroenterology;  Laterality: N/A;   OOPHORECTOMY     Only 1 was taken   POLYPECTOMY  05/29/2019   Procedure: POLYPECTOMY;  Surgeon: Mansouraty, Netty Starring., MD;  Location: [facility];   Service: Gastroenterology;;   REMOVAL OF STONES  05/29/2019   Procedure: REMOVAL OF STONES;  Surgeon: Lemar Lofty., MD;  Location: [facility];  Service: Gastroenterology;;   TONSILLECTOMY     TUBAL LIGATION     Patient Active Problem List   Diagnosis Date Noted   Acute on chronic pancreatitis (HCC) 09/30/2022   Acute pancreatitis 01/21/2020   Gastroesophageal reflux disease    Basal cell carcinoma (BCC) in situ of skin 10/11/2018   Stress incontinence 10/11/2018   Hypotension (arterial) 08/09/2018   Sinus bradycardia on ECG 08/09/2018   AKI (acute kidney injury) (HCC) 08/09/2018   Recurrent acute pancreatitis 08/08/2018   Nausea & vomiting 03/07/2018   Sepsis (HCC) 01/01/2018   Alternating constipation and diarrhea 09/26/2016   Obesity (BMI 30-39.9) 07/20/2014   Chronic insomnia 06/16/2014   Hypokalemia 06/10/2014   Volume overload 06/10/2014   UTI (urinary tract infection) 06/10/2014   Leukocytosis 06/10/2014   Acute respiratory failure with hypoxia (HCC) 06/10/2014   Acute recurrent pancreatitis 06/06/2014   Pancreatitis 06/06/2014   HTN (hypertension) 09/11/2012   Adjustment disorder with mixed anxiety and depressed mood 09/11/2012   Pancreas divisum 01/18/2011   URI 09/02/2010   Depression, recurrent (HCC) 04/01/2010   PANCREATITIS, HX OF 04/01/2010     PCP: [name]  REFERRING PROVIDER:   REFERRING DIAG: [name]  THERAPY DIAG:  Chronic pain of right knee  Pain in  right leg  Other low back pain  Rationale for Evaluation and Treatment: Rehabilitation  ONSET DATE:     SUBJECTIVE:   SUBJECTIVE STATEMENT: 03/26/2023 States soreness with a couple of the exercises for HEP . Not feeling her best today, slight nausea from her pancreatitis- she states normal/baseline feeling.   Eval: Pt states about 3 mo ago, increased pain in R knee. Pain was significant.  Had cortisone shot a couple week ago (1st injection)  helped quite a bit.  Still having some soreness in back of knee, feels like a pulling. Most pain still in the back,  Has had pain In the past with good outcome with PT.  She also states some pain from low back into each leg at times.  Lives alone/widow. Currently not doing exercise for knees or back, but has gym at housing  complex.  Stairs: No, lives on 1st floor.   PERTINENT HISTORY: Chronic pancreatitis   PAIN:  Are you having pain? Yes: NPRS scale: 4/10 Pain location: R knee/posterior  Pain description: sore, tight Aggravating factors: bending, straightening, increased activity  Relieving factors: injection  PRECAUTIONS: None  WEIGHT BEARING RESTRICTIONS: No  FALLS:  Has patient fallen in last 6 months? No  PLOF: Independent  PATIENT GOALS:  Decreased pain   NEXT MD VISIT:   OBJECTIVE:   DIAGNOSTIC FINDINGS:   PATIENT SURVEYS:    COGNITION: Overall cognitive status: Within functional limits for tasks assessed     SENSATION: WFL  EDEMA:   POSTURE:    No Significant postural limitations  PALPATION:   soreness in posterior knee/popliteal fossa, likely bakers cyst    LOWER EXTREMITY ROM: Hips; WFL Knees: WFL Lumbar: mild limitation and pain for SB/ bil    LOWER EXTREMITY MMT:  MMT Left eval Right  eval  Hip flexion 4 4  Hip extension    Hip abduction 4 4  Hip adduction    Hip internal rotation    Hip external rotation    Knee flexion 5 4  Knee extension 5 4  Ankle dorsiflexion    Ankle plantarflexion    Ankle inversion    Ankle eversion     (Blank rows = not tested)  LOWER EXTREMITY SPECIAL TESTS:     TODAY'S TREATMENT:                                                                                                                              DATE:   03/26/2023 Therapeutic Exercise: Aerobic: Supine:  SLR x 10 bil;  Seated:  LAQ 2 x 10 bil;  Standing: Stretches:  Seated hamstring stretch 30 sec x 3 bil;  Standing gastroc stretch 30 sec x 3 bil;    LTR  x 10 for low back;    pelvic tilts  supine x 10  (improved ability in sitting position)  Neuromuscular Re-education: Manual Therapy: STM to posterior knee , distal hamstring and proximal gastroc.  Therapeutic Activity: Self  Care:   03/19/2023 Therapeutic Exercise: Aerobic: Supine: Seated:   Standing: Stretches:  Seated hamstring stretch 30 sec x 3 bil;  Standing gastroc stretch 30 sec x 3 bil;   LTR x 10 for low back;  pelvic tilts seated and supine x 15 ea ( improved ability in sitting position)  Neuromuscular Re-education: Manual Therapy: Therapeutic Activity: Self Care:    PATIENT EDUCATION:  Education details: updated and reviewed HEP Person educated: Patient Education method: Explanation, Demonstration, Tactile cues, Verbal cues, and Handouts Education comprehension: verbalized understanding, returned demonstration, verbal cues required, tactile cues required, and needs further education   HOME EXERCISE PROGRAM: Access Code: [email or web address] URL: [email or web address] Date: 03/19/2023 Prepared by: Sedalia Muta  Exercises - Seated Hamstring Stretch  - 2 x daily - 3 reps - 30 hold - Gastroc Stretch on Wall  - 2 x daily - 3 reps - 20 hold - Supine Lower Trunk Rotation  - 1 x daily - 10 reps - 10 hold - Supine Posterior Pelvic Tilt  - 2 x daily - 2 sets - 10 reps    ASSESSMENT:  CLINICAL IMPRESSION: 03/26/2023 Reviewed HEP in detail today, for optimal mechanics, discussed that knees should not hurt while performing these. Improved ability for gastroc stretch without soreness after review today. Pt requires mod-max cuing for ther ex throughout session. Will benefit from progressive strengthening as able.   Eval: Patient presents with primary complaint of  increased pain in R knee. She has most pain in posterior knee, and symptoms consistent with bakers cyst. She has had injection with slight improvement of pain. She also has other pain radiating into LEs at  times, that is likely stemming from low back. She has had ongoing/intermittent back issues in the past , and will benefit from HEP for management of this as well. Pt with decreased ability for full functional activities, walking, and stairs, and will benefit from skilled PT to improve .   Pt with decreased ability for full functional activities. Pt will  benefit from skilled PT to improve deficits and pain and to return to PLOF.   OBJECTIVE IMPAIRMENTS: decreased activity tolerance, decreased mobility, decreased strength, increased muscle spasms, improper body mechanics, and pain.   ACTIVITY LIMITATIONS: bending, standing, squatting, stairs, and locomotion level  PARTICIPATION LIMITATIONS: meal prep, cleaning, driving, shopping, and community activity  PERSONAL FACTORS:  none  are also affecting patient's functional outcome.   REHAB POTENTIAL: Good  CLINICAL DECISION MAKING: Stable/uncomplicated  EVALUATION COMPLEXITY: Low   GOALS: Goals reviewed with patient? Yes  SHORT TERM GOALS: Target date: 04/02/2023  Pt to be independent with initial HEP  Goal status: INITIAL    LONG TERM GOALS: Target date: 05/15/2023  Pt to be independent with final HEP  Goal status: INITIAL  2.  Pt to report decreased pain In knee to 0-2/10 with activity   Goal status: INITIAL  3.  Pt to demo ability for stair navigation with good safety and no pain, to improve ability for community activity   Goal status: INITIAL  4.  Pt to demo strength of R knee and hip to be wfl, to improve stability and pain.   Goal status: INITIAL    PLAN:  PT FREQUENCY: 1-2x/week  PT DURATION: 8 weeks  PLANNED INTERVENTIONS: Therapeutic exercises, Therapeutic activity, Neuromuscular re-education, Patient/Family education, Self Care, Joint mobilization, Joint manipulation, Stair training, Orthotic/Fit training, DME instructions, Aquatic Therapy, Dry Needling, Electrical stimulation, Cryotherapy, Moist heat,  Taping, Ultrasound, Ionotophoresis 4mg /ml Dexamethasone, Manual therapy,  Vasopneumatic  device, Traction, Spinal manipulation, Spinal mobilization,Balance training, Gait training,   PLAN FOR NEXT SESSION:   Sedalia Muta, PT, DPT 4:59 PM  03/26/23

## 2023-03-29 ENCOUNTER — Encounter: Payer: Self-pay | Admitting: Physical Therapy

## 2023-03-29 ENCOUNTER — Ambulatory Visit (INDEPENDENT_AMBULATORY_CARE_PROVIDER_SITE_OTHER): Payer: Medicare Other | Admitting: Physical Therapy

## 2023-03-29 DIAGNOSIS — M25561 Pain in right knee: Secondary | ICD-10-CM

## 2023-03-29 DIAGNOSIS — M79604 Pain in right leg: Secondary | ICD-10-CM

## 2023-03-29 DIAGNOSIS — G8929 Other chronic pain: Secondary | ICD-10-CM

## 2023-03-29 DIAGNOSIS — M5459 Other low back pain: Secondary | ICD-10-CM | POA: Diagnosis not present

## 2023-03-29 NOTE — Therapy (Signed)
OUTPATIENT PHYSICAL THERAPY LOWER EXTREMITY Treatment    Patient Name: Linda Blackburn MRN: [id number] DOB:03/31/1951, 72 y.o., female Today's Date: 03/29/2023  END OF SESSION:  PT End of Session - 03/29/23 1510     Visit Number 3    Number of Visits 16    Date for PT Re-Evaluation 05/14/23    Authorization Type Medicare    PT Start Time 1513    PT Stop Time 1554    PT Time Calculation (min) 41 min    Activity Tolerance Patient tolerated treatment well    Behavior During Therapy WFL for tasks assessed/performed             Past Medical History:  Diagnosis Date   Abnormal uterine bleeding    Amenorrhea    Anxiety    Depression    Gallstones    Hay fever    HTN (hypertension)    Pancreatitis    Urine incontinence    UTI (urinary tract infection)    reoccuring    Past Surgical History:  Procedure Laterality Date   ABDOMINAL HYSTERECTOMY  1984   fibroids   APPENDECTOMY  1984   BILIARY DILATION  05/29/2019   Procedure: BILIARY DILATION;  Surgeon: Lemar Lofty., MD;  Location: [facility];  Service: Gastroenterology;;   BIOPSY  05/29/2019   Procedure: BIOPSY;  Surgeon: Lemar Lofty., MD;  Location: [facility];  Service: Gastroenterology;;   CATARACT EXTRACTION Right 11/22/2021   CHOLECYSTECTOMY  09/08/1999   ERCP N/A 05/29/2019   Procedure: ENDOSCOPIC RETROGRADE CHOLANGIOPANCREATOGRAPHY (ERCP);  Surgeon: Lemar Lofty., MD;  Location: [facility];  Service: Gastroenterology;  Laterality: N/A;   ERCP W/ SPHINCTEROTOMY AND BALLOON DILATION  08/2008   ESOPHAGOGASTRODUODENOSCOPY (EGD) WITH PROPOFOL N/A 05/29/2019   Procedure: ESOPHAGOGASTRODUODENOSCOPY (EGD) WITH PROPOFOL;  Surgeon: Meridee Score Netty Starring., MD;  Location: [facility];  Service: Gastroenterology;  Laterality: N/A;   OOPHORECTOMY     Only 1 was taken   POLYPECTOMY  05/29/2019   Procedure: POLYPECTOMY;  Surgeon: Mansouraty, Netty Starring., MD;  Location: [facility];   Service: Gastroenterology;;   REMOVAL OF STONES  05/29/2019   Procedure: REMOVAL OF STONES;  Surgeon: Lemar Lofty., MD;  Location: [facility];  Service: Gastroenterology;;   TONSILLECTOMY     TUBAL LIGATION     Patient Active Problem List   Diagnosis Date Noted   Acute on chronic pancreatitis (HCC) 09/30/2022   Acute pancreatitis 01/21/2020   Gastroesophageal reflux disease    Basal cell carcinoma (BCC) in situ of skin 10/11/2018   Stress incontinence 10/11/2018   Hypotension (arterial) 08/09/2018   Sinus bradycardia on ECG 08/09/2018   AKI (acute kidney injury) (HCC) 08/09/2018   Recurrent acute pancreatitis 08/08/2018   Nausea & vomiting 03/07/2018   Sepsis (HCC) 01/01/2018   Alternating constipation and diarrhea 09/26/2016   Obesity (BMI 30-39.9) 07/20/2014   Chronic insomnia 06/16/2014   Hypokalemia 06/10/2014   Volume overload 06/10/2014   UTI (urinary tract infection) 06/10/2014   Leukocytosis 06/10/2014   Acute respiratory failure with hypoxia (HCC) 06/10/2014   Acute recurrent pancreatitis 06/06/2014   Pancreatitis 06/06/2014   HTN (hypertension) 09/11/2012   Adjustment disorder with mixed anxiety and depressed mood 09/11/2012   Pancreas divisum 01/18/2011   URI 09/02/2010   Depression, recurrent (HCC) 04/01/2010   PANCREATITIS, HX OF 04/01/2010     PCP: [name]  REFERRING PROVIDER:   REFERRING DIAG: [name]  THERAPY DIAG:  Chronic pain of right knee  Pain in  right leg  Other low back pain  Rationale for Evaluation and Treatment: Rehabilitation  ONSET DATE:     SUBJECTIVE:   SUBJECTIVE STATEMENT: 03/29/2023 States soreness in knee. Exercises going ok.   Eval: Pt states about 3 mo ago, increased pain in R knee. Pain was significant.  Had cortisone shot a couple week ago (1st injection)  helped quite a bit. Still having some soreness in back of knee, feels like a pulling. Most pain still in the back,  Has had pain In  the past with good outcome with PT.  She also states some pain from low back into each leg at times.  Lives alone/widow. Currently not doing exercise for knees or back, but has gym at housing  complex.  Stairs: No, lives on 1st floor.   PERTINENT HISTORY: Chronic pancreatitis   PAIN:  Are you having pain? Yes: NPRS scale: 4/10 Pain location: R knee/posterior  Pain description: sore, tight Aggravating factors: bending, straightening, increased activity  Relieving factors: injection  PRECAUTIONS: None  WEIGHT BEARING RESTRICTIONS: No  FALLS:  Has patient fallen in last 6 months? No  PLOF: Independent  PATIENT GOALS:  Decreased pain   NEXT MD VISIT:   OBJECTIVE:   DIAGNOSTIC FINDINGS:   PATIENT SURVEYS:    COGNITION: Overall cognitive status: Within functional limits for tasks assessed     SENSATION: WFL  EDEMA:   POSTURE:    No Significant postural limitations  PALPATION:   soreness in posterior knee/popliteal fossa, likely bakers cyst    LOWER EXTREMITY ROM: Hips; WFL Knees: WFL Lumbar: mild limitation and pain for SB/ bil    LOWER EXTREMITY MMT:  MMT Left eval Right  eval  Hip flexion 4 4  Hip extension    Hip abduction 4 4  Hip adduction    Hip internal rotation    Hip external rotation    Knee flexion 5 4  Knee extension 5 4  Ankle dorsiflexion    Ankle plantarflexion    Ankle inversion    Ankle eversion     (Blank rows = not tested)  LOWER EXTREMITY SPECIAL TESTS:     TODAY'S TREATMENT:                                                                                                                              DATE:   03/29/2023 Therapeutic Exercise: Aerobic: Bike L1 x 5 min  Supine:  SLR 2 x 10 bil;   SAQ  2.5 lb x 15 on R;  Seated:  LAQ 2.5 lb  2 x 10 bil; sit to stand 2 x 5  (soreness in r knee)  Standing: Stretches:   pelvic tilts  supine and seated x 10 ea  Neuromuscular Re-education: Manual Therapy: STM to posterior knee ,  distal/medial  hamstring and proximal gastroc.  Therapeutic Activity: Self Care:   Therapeutic Exercise: Aerobic:  Supine:   Seated:  LAQ 2.5 lb  2  x 10 bil;  Standing: Stretches:  Seated hamstring stretch 30 sec x 3 bil;  Standing gastroc stretch 30 sec x 3 bil;    LTR x 10 for low back;   pelvic tilts  supine x 10  (improved ability in sitting position)  Neuromuscular Re-education: Manual Therapy: STM to posterior knee , distal hamstring and proximal gastroc.  Therapeutic Activity: Self Care:    03/19/2023 Therapeutic Exercise: Aerobic: Supine: Seated:   Standing: Stretches:  Seated hamstring stretch 30 sec x 3 bil;  Standing gastroc stretch 30 sec x 3 bil;   LTR x 10 for low back;  pelvic tilts seated and supine x 15 ea ( improved ability in sitting position)  Neuromuscular Re-education: Manual Therapy: Therapeutic Activity: Self Care:   PATIENT EDUCATION:  Education details: updated and reviewed HEP Person educated: Patient Education method: Explanation, Demonstration, Tactile cues, Verbal cues, and Handouts Education comprehension: verbalized understanding, returned demonstration, verbal cues required, tactile cues required, and needs further education   HOME EXERCISE PROGRAM: Access Code: [email or web address]    ASSESSMENT:  CLINICAL IMPRESSION: 03/29/2023 Pt with tenderness in posterior and anterior knee with palpation. Increased pain posteriorly with full flexion, and increased pain anteriorly with full extension. Good ability for light quad strengthening today. Did have some soreness with sit to stands.  Reviewed exercise bike, pt may try at her housing complex gym. Plan to progress strength as tolerated.   Eval: Patient presents with primary complaint of  increased pain in R knee. She has most pain in posterior knee, and symptoms consistent with bakers cyst. She has had injection with slight improvement of pain. She also has other pain radiating into LEs at times, that  is likely stemming from low back. She has had ongoing/intermittent back issues in the past , and will benefit from HEP for management of this as well. Pt with decreased ability for full functional activities, walking, and stairs, and will benefit from skilled PT to improve .   Pt with decreased ability for full functional activities. Pt will  benefit from skilled PT to improve deficits and pain and to return to PLOF.   OBJECTIVE IMPAIRMENTS: decreased activity tolerance, decreased mobility, decreased strength, increased muscle spasms, improper body mechanics, and pain.   ACTIVITY LIMITATIONS: bending, standing, squatting, stairs, and locomotion level  PARTICIPATION LIMITATIONS: meal prep, cleaning, driving, shopping, and community activity  PERSONAL FACTORS:  none  are also affecting patient's functional outcome.   REHAB POTENTIAL: Good  CLINICAL DECISION MAKING: Stable/uncomplicated  EVALUATION COMPLEXITY: Low   GOALS: Goals reviewed with patient? Yes  SHORT TERM GOALS: Target date: 04/02/2023  Pt to be independent with initial HEP  Goal status: INITIAL    LONG TERM GOALS: Target date: 05/15/2023  Pt to be independent with final HEP  Goal status: INITIAL  2.  Pt to report decreased pain In knee to 0-2/10 with activity   Goal status: INITIAL  3.  Pt to demo ability for stair navigation with good safety and no pain, to improve ability for community activity   Goal status: INITIAL  4.  Pt to demo strength of R knee and hip to be wfl, to improve stability and pain.   Goal status: INITIAL    PLAN:  PT FREQUENCY: 1-2x/week  PT DURATION: 8 weeks  PLANNED INTERVENTIONS: Therapeutic exercises, Therapeutic activity, Neuromuscular re-education, Patient/Family education, Self Care, Joint mobilization, Joint manipulation, Stair training, Orthotic/Fit training, DME instructions, Aquatic Therapy, Dry Needling, Electrical stimulation, Cryotherapy, Moist heat, Taping,  Ultrasound, Ionotophoresis  4mg /ml Dexamethasone, Manual therapy,  Vasopneumatic device, Traction, Spinal manipulation, Spinal mobilization,Balance training, Gait training,   PLAN FOR NEXT SESSION:   Sedalia Muta, PT, DPT 4:53 PM  03/29/23

## 2023-04-02 ENCOUNTER — Encounter: Payer: Medicare Other | Admitting: Physical Therapy

## 2023-04-04 ENCOUNTER — Ambulatory Visit (INDEPENDENT_AMBULATORY_CARE_PROVIDER_SITE_OTHER): Payer: Medicare Other | Admitting: Physical Therapy

## 2023-04-04 ENCOUNTER — Encounter: Payer: Self-pay | Admitting: Physical Therapy

## 2023-04-04 DIAGNOSIS — M5459 Other low back pain: Secondary | ICD-10-CM

## 2023-04-04 DIAGNOSIS — G8929 Other chronic pain: Secondary | ICD-10-CM

## 2023-04-04 DIAGNOSIS — M25561 Pain in right knee: Secondary | ICD-10-CM

## 2023-04-04 DIAGNOSIS — M79604 Pain in right leg: Secondary | ICD-10-CM | POA: Diagnosis not present

## 2023-04-04 NOTE — Therapy (Signed)
OUTPATIENT PHYSICAL THERAPY LOWER EXTREMITY Treatment    Patient Name: Linda Blackburn MRN: [id number] DOB:May 12, 1951, 72 y.o., female Today's Date: 04/04/2023  END OF SESSION:  PT End of Session - 04/04/23 1308     Visit Number 4    Number of Visits 16    Date for PT Re-Evaluation 05/14/23    Authorization Type Medicare    PT Start Time 1308    PT Stop Time 1346    PT Time Calculation (min) 38 min    Activity Tolerance Patient tolerated treatment well    Behavior During Therapy WFL for tasks assessed/performed             Past Medical History:  Diagnosis Date   Abnormal uterine bleeding    Amenorrhea    Anxiety    Depression    Gallstones    Hay fever    HTN (hypertension)    Pancreatitis    Urine incontinence    UTI (urinary tract infection)    reoccuring    Past Surgical History:  Procedure Laterality Date   ABDOMINAL HYSTERECTOMY  1984   fibroids   APPENDECTOMY  1984   BILIARY DILATION  05/29/2019   Procedure: BILIARY DILATION;  Surgeon: Lemar Lofty., MD;  Location: [facility];  Service: Gastroenterology;;   BIOPSY  05/29/2019   Procedure: BIOPSY;  Surgeon: Lemar Lofty., MD;  Location: [facility];  Service: Gastroenterology;;   CATARACT EXTRACTION Right 11/22/2021   CHOLECYSTECTOMY  09/08/1999   ERCP N/A 05/29/2019   Procedure: ENDOSCOPIC RETROGRADE CHOLANGIOPANCREATOGRAPHY (ERCP);  Surgeon: Lemar Lofty., MD;  Location: [facility];  Service: Gastroenterology;  Laterality: N/A;   ERCP W/ SPHINCTEROTOMY AND BALLOON DILATION  08/2008   ESOPHAGOGASTRODUODENOSCOPY (EGD) WITH PROPOFOL N/A 05/29/2019   Procedure: ESOPHAGOGASTRODUODENOSCOPY (EGD) WITH PROPOFOL;  Surgeon: Meridee Score Netty Starring., MD;  Location: [facility];  Service: Gastroenterology;  Laterality: N/A;   OOPHORECTOMY     Only 1 was taken   POLYPECTOMY  05/29/2019   Procedure: POLYPECTOMY;  Surgeon: Mansouraty, Netty Starring., MD;  Location: [facility];   Service: Gastroenterology;;   REMOVAL OF STONES  05/29/2019   Procedure: REMOVAL OF STONES;  Surgeon: Lemar Lofty., MD;  Location: [facility];  Service: Gastroenterology;;   TONSILLECTOMY     TUBAL LIGATION     Patient Active Problem List   Diagnosis Date Noted   Acute on chronic pancreatitis (HCC) 09/30/2022   Acute pancreatitis 01/21/2020   Gastroesophageal reflux disease    Basal cell carcinoma (BCC) in situ of skin 10/11/2018   Stress incontinence 10/11/2018   Hypotension (arterial) 08/09/2018   Sinus bradycardia on ECG 08/09/2018   AKI (acute kidney injury) (HCC) 08/09/2018   Recurrent acute pancreatitis 08/08/2018   Nausea & vomiting 03/07/2018   Sepsis (HCC) 01/01/2018   Alternating constipation and diarrhea 09/26/2016   Obesity (BMI 30-39.9) 07/20/2014   Chronic insomnia 06/16/2014   Hypokalemia 06/10/2014   Volume overload 06/10/2014   UTI (urinary tract infection) 06/10/2014   Leukocytosis 06/10/2014   Acute respiratory failure with hypoxia (HCC) 06/10/2014   Acute recurrent pancreatitis 06/06/2014   Pancreatitis 06/06/2014   HTN (hypertension) 09/11/2012   Adjustment disorder with mixed anxiety and depressed mood 09/11/2012   Pancreas divisum 01/18/2011   URI 09/02/2010   Depression, recurrent (HCC) 04/01/2010   PANCREATITIS, HX OF 04/01/2010     PCP: [name]  REFERRING PROVIDER:   REFERRING DIAG: [name]  THERAPY DIAG:  Chronic pain of right knee  Pain in  right leg  Other low back pain  Rationale for Evaluation and Treatment: Rehabilitation  ONSET DATE:     SUBJECTIVE:   SUBJECTIVE STATEMENT: 04/04/2023 States mild soreness, feels achey in R anterior knee, around knee cap.  Back of knee feeling better.   Eval: Pt states about 3 mo ago, increased pain in R knee. Pain was significant.  Had cortisone shot a couple week ago (1st injection)  helped quite a bit. Still having some soreness in back of knee, feels like a  pulling. Most pain still in the back,  Has had pain In the past with good outcome with PT.  She also states some pain from low back into each leg at times.  Lives alone/widow. Currently not doing exercise for knees or back, but has gym at housing  complex.  Stairs: No, lives on 1st floor.   PERTINENT HISTORY: Chronic pancreatitis   PAIN:  Are you having pain? Yes: NPRS scale: 4/10 Pain location: R knee/posterior  Pain description: sore, tight Aggravating factors: bending, straightening, increased activity  Relieving factors: injection  PRECAUTIONS: None  WEIGHT BEARING RESTRICTIONS: No  FALLS:  Has patient fallen in last 6 months? No  PLOF: Independent  PATIENT GOALS:  Decreased pain   NEXT MD VISIT:   OBJECTIVE:   DIAGNOSTIC FINDINGS:   PATIENT SURVEYS:    COGNITION: Overall cognitive status: Within functional limits for tasks assessed     SENSATION: WFL  EDEMA:   POSTURE:    No Significant postural limitations  PALPATION:   soreness in posterior knee/popliteal fossa, likely bakers cyst    LOWER EXTREMITY ROM: Hips; WFL Knees: WFL Lumbar: mild limitation and pain for SB/ bil    LOWER EXTREMITY MMT:  MMT Left eval Right  eval  Hip flexion 4 4  Hip extension    Hip abduction 4 4  Hip adduction    Hip internal rotation    Hip external rotation    Knee flexion 5 4  Knee extension 5 4  Ankle dorsiflexion    Ankle plantarflexion    Ankle inversion    Ankle eversion     (Blank rows = not tested)  LOWER EXTREMITY SPECIAL TESTS:     TODAY'S TREATMENT:                                                                                                                              DATE:   04/04/2023 Therapeutic Exercise: Aerobic: Supine:  SLR 2 x 10 bil;   Quad sets 2 x 10, education and practice for optimal quad contraction  Seated:  LAQ 3 lb  on L,  no weight on R,  2 x 10 bil;    Standing: Stretches:   seated HSS 30 sec x 3;   Neuromuscular  Re-education: Manual Therapy: IASTM to R quad;  K-tape to R knee, patella tendon decompression 2 I-strips, pt given education on use, wear time and precautions.  Therapeutic Activity:  Self Care:   Therapeutic Exercise: Aerobic:  bike L1 x 5 min  Supine:   Seated:  LAQ 2.5 lb  2 x 10 bil;  Standing: Stretches:  Seated hamstring stretch 30 sec x 3 bil;  Standing gastroc stretch 30 sec x 3 bil;    LTR x 10 for low back;   pelvic tilts  supine x 10  (improved ability in sitting position)  Neuromuscular Re-education: Manual Therapy: STM to posterior knee , distal hamstring and proximal gastroc.  Therapeutic Activity: Self Care:   03/19/2023 Therapeutic Exercise: Aerobic: Supine: Seated:   Standing: Stretches:  Seated hamstring stretch 30 sec x 3 bil;  Standing gastroc stretch 30 sec x 3 bil;   LTR x 10 for low back;  pelvic tilts seated and supine x 15 ea ( improved ability in sitting position)  Neuromuscular Re-education: Manual Therapy: Therapeutic Activity: Self Care:   PATIENT EDUCATION:  Education details: updated and reviewed HEP Person educated: Patient Education method: Explanation, Demonstration, Tactile cues, Verbal cues, and Handouts Education comprehension: verbalized understanding, returned demonstration, verbal cues required, tactile cues required, and needs further education   HOME EXERCISE PROGRAM: Access Code: [email or web address]    ASSESSMENT:  CLINICAL IMPRESSION: 04/04/2023 Pt with most tenderness in anterior knee with palpation and activity today. She has variable ability for LAQ today, with pain around knee cap. She has decreased ability for quad contraction in supine with quad set. Education and practice today for achieving contraction. She does have mild/mod pain at times with this as well. Trial for taping to anterior knee today, will assess effects next visit. Pt to benefit from continued strengthening as able.   Eval: Patient presents with primary complaint  of  increased pain in R knee. She has most pain in posterior knee, and symptoms consistent with bakers cyst. She has had injection with slight improvement of pain. She also has other pain radiating into LEs at times, that is likely stemming from low back. She has had ongoing/intermittent back issues in the past , and will benefit from HEP for management of this as well. Pt with decreased ability for full functional activities, walking, and stairs, and will benefit from skilled PT to improve .   Pt with decreased ability for full functional activities. Pt will  benefit from skilled PT to improve deficits and pain and to return to PLOF.   OBJECTIVE IMPAIRMENTS: decreased activity tolerance, decreased mobility, decreased strength, increased muscle spasms, improper body mechanics, and pain.   ACTIVITY LIMITATIONS: bending, standing, squatting, stairs, and locomotion level  PARTICIPATION LIMITATIONS: meal prep, cleaning, driving, shopping, and community activity  PERSONAL FACTORS:  none  are also affecting patient's functional outcome.   REHAB POTENTIAL: Good  CLINICAL DECISION MAKING: Stable/uncomplicated  EVALUATION COMPLEXITY: Low   GOALS: Goals reviewed with patient? Yes  SHORT TERM GOALS: Target date: 04/02/2023  Pt to be independent with initial HEP  Goal status: INITIAL    LONG TERM GOALS: Target date: 05/15/2023  Pt to be independent with final HEP  Goal status: INITIAL  2.  Pt to report decreased pain In knee to 0-2/10 with activity   Goal status: INITIAL  3.  Pt to demo ability for stair navigation with good safety and no pain, to improve ability for community activity   Goal status: INITIAL  4.  Pt to demo strength of R knee and hip to be wfl, to improve stability and pain.   Goal status: INITIAL    PLAN:  PT FREQUENCY: 1-2x/week  PT DURATION: 8 weeks  PLANNED INTERVENTIONS: Therapeutic exercises, Therapeutic activity, Neuromuscular re-education,  Patient/Family education, Self Care, Joint mobilization, Joint manipulation, Stair training, Orthotic/Fit training, DME instructions, Aquatic Therapy, Dry Needling, Electrical stimulation, Cryotherapy, Moist heat, Taping, Ultrasound, Ionotophoresis 4mg /ml Dexamethasone, Manual therapy,  Vasopneumatic device, Traction, Spinal manipulation, Spinal mobilization,Balance training, Gait training,   PLAN FOR NEXT SESSION:   Sedalia Muta, PT, DPT 2:00 PM  04/04/23

## 2023-04-09 ENCOUNTER — Ambulatory Visit (INDEPENDENT_AMBULATORY_CARE_PROVIDER_SITE_OTHER): Payer: Medicare Other | Admitting: Physical Therapy

## 2023-04-09 DIAGNOSIS — G8929 Other chronic pain: Secondary | ICD-10-CM

## 2023-04-09 DIAGNOSIS — M79604 Pain in right leg: Secondary | ICD-10-CM

## 2023-04-09 DIAGNOSIS — M5459 Other low back pain: Secondary | ICD-10-CM | POA: Diagnosis not present

## 2023-04-09 DIAGNOSIS — M25561 Pain in right knee: Secondary | ICD-10-CM | POA: Diagnosis not present

## 2023-04-09 NOTE — Therapy (Addendum)
OUTPATIENT PHYSICAL THERAPY LOWER EXTREMITY Treatment    Patient Name: Linda Blackburn MRN: [id number] DOB:15-Jul-1951, 72 y.o., female Today's Date: 04/09/2023  END OF SESSION:  PT End of Session - 04/10/23 0909     Visit Number 5    Number of Visits 16    Date for PT Re-Evaluation 05/14/23    Authorization Type Medicare    PT Start Time 1517    PT Stop Time 1555    PT Time Calculation (min) 38 min    Activity Tolerance Patient tolerated treatment well    Behavior During Therapy WFL for tasks assessed/performed              Past Medical History:  Diagnosis Date   Abnormal uterine bleeding    Amenorrhea    Anxiety    Depression    Gallstones    Hay fever    HTN (hypertension)    Pancreatitis    Urine incontinence    UTI (urinary tract infection)    reoccuring    Past Surgical History:  Procedure Laterality Date   ABDOMINAL HYSTERECTOMY  1984   fibroids   APPENDECTOMY  1984   BILIARY DILATION  05/29/2019   Procedure: BILIARY DILATION;  Surgeon: Lemar Lofty., MD;  Location: [facility];  Service: Gastroenterology;;   BIOPSY  05/29/2019   Procedure: BIOPSY;  Surgeon: Lemar Lofty., MD;  Location: [facility];  Service: Gastroenterology;;   CATARACT EXTRACTION Right 11/22/2021   CHOLECYSTECTOMY  09/08/1999   ERCP N/A 05/29/2019   Procedure: ENDOSCOPIC RETROGRADE CHOLANGIOPANCREATOGRAPHY (ERCP);  Surgeon: Lemar Lofty., MD;  Location: [facility];  Service: Gastroenterology;  Laterality: N/A;   ERCP W/ SPHINCTEROTOMY AND BALLOON DILATION  08/2008   ESOPHAGOGASTRODUODENOSCOPY (EGD) WITH PROPOFOL N/A 05/29/2019   Procedure: ESOPHAGOGASTRODUODENOSCOPY (EGD) WITH PROPOFOL;  Surgeon: Meridee Score Netty Starring., MD;  Location: [facility];  Service: Gastroenterology;  Laterality: N/A;   OOPHORECTOMY     Only 1 was taken   POLYPECTOMY  05/29/2019   Procedure: POLYPECTOMY;  Surgeon: Mansouraty, Netty Starring., MD;  Location: [facility];   Service: Gastroenterology;;   REMOVAL OF STONES  05/29/2019   Procedure: REMOVAL OF STONES;  Surgeon: Lemar Lofty., MD;  Location: [facility];  Service: Gastroenterology;;   TONSILLECTOMY     TUBAL LIGATION     Patient Active Problem List   Diagnosis Date Noted   Acute on chronic pancreatitis (HCC) 09/30/2022   Acute pancreatitis 01/21/2020   Gastroesophageal reflux disease    Basal cell carcinoma (BCC) in situ of skin 10/11/2018   Stress incontinence 10/11/2018   Hypotension (arterial) 08/09/2018   Sinus bradycardia on ECG 08/09/2018   AKI (acute kidney injury) (HCC) 08/09/2018   Recurrent acute pancreatitis 08/08/2018   Nausea & vomiting 03/07/2018   Sepsis (HCC) 01/01/2018   Alternating constipation and diarrhea 09/26/2016   Obesity (BMI 30-39.9) 07/20/2014   Chronic insomnia 06/16/2014   Hypokalemia 06/10/2014   Volume overload 06/10/2014   UTI (urinary tract infection) 06/10/2014   Leukocytosis 06/10/2014   Acute respiratory failure with hypoxia (HCC) 06/10/2014   Acute recurrent pancreatitis 06/06/2014   Pancreatitis 06/06/2014   HTN (hypertension) 09/11/2012   Adjustment disorder with mixed anxiety and depressed mood 09/11/2012   Pancreas divisum 01/18/2011   URI 09/02/2010   Depression, recurrent (HCC) 04/01/2010   PANCREATITIS, HX OF 04/01/2010     PCP: [name]  REFERRING PROVIDER:   REFERRING DIAG: [name]  THERAPY DIAG:  Chronic pain of right knee  Pain  in right leg  Other low back pain  Rationale for Evaluation and Treatment: Rehabilitation  ONSET DATE:     SUBJECTIVE:   SUBJECTIVE STATEMENT: 04/09/2023 Pt states increased/continued pain in R knee over last few days. States she can not get achy pain to go away, and that it is making her anxious and worried. She had difficulty sleeping last night.   Eval: Pt states about 3 mo ago, increased pain in R knee. Pain was significant.  Had cortisone shot a couple week ago  (1st injection)  helped quite a bit. Still having some soreness in back of knee, feels like a pulling. Most pain still in the back,  Has had pain In the past with good outcome with PT.  She also states some pain from low back into each leg at times.  Lives alone/widow. Currently not doing exercise for knees or back, but has gym at housing  complex.  Stairs: No, lives on 1st floor.   PERTINENT HISTORY: Chronic pancreatitis   PAIN:  Are you having pain? Yes: NPRS scale: 4/10 Pain location: R knee/posterior  Pain description: sore, tight Aggravating factors: bending, straightening, increased activity  Relieving factors: injection  PRECAUTIONS: None  WEIGHT BEARING RESTRICTIONS: No  FALLS:  Has patient fallen in last 6 months? No  PLOF: Independent  PATIENT GOALS:  Decreased pain   NEXT MD VISIT:   OBJECTIVE:   DIAGNOSTIC FINDINGS:   PATIENT SURVEYS:    COGNITION: Overall cognitive status: Within functional limits for tasks assessed     SENSATION: WFL  EDEMA:   POSTURE:    No Significant postural limitations  PALPATION:   soreness in posterior knee/popliteal fossa, likely bakers cyst    LOWER EXTREMITY ROM: Hips; WFL Knees: WFL Lumbar: mild limitation and pain for SB/ bil    LOWER EXTREMITY MMT:  MMT Left eval Right  eval  Hip flexion 4 4  Hip extension    Hip abduction 4 4  Hip adduction    Hip internal rotation    Hip external rotation    Knee flexion 5 4  Knee extension 5 4  Ankle dorsiflexion    Ankle plantarflexion    Ankle inversion    Ankle eversion     (Blank rows = not tested)  LOWER EXTREMITY SPECIAL TESTS:     TODAY'S TREATMENT:                                                                                                                              DATE:   04/09/2023 Therapeutic Exercise: Aerobic: Supine:  Quad sets 2 x 10, education and practice for optimal quad contraction but not causing pain  Seated:  LAQ no weight ,   x  10 bil;   education on seated knee posture and supine/sleeping knee posture.  Standing: Stretches:   seated HSS 30 sec x 3;   Neuromuscular Re-education: Manual Therapy:  Therapeutic Activity: Self Care: education on pain  management, home icing, light HEP, and recommendation to see sports med this week for further assessment if she feels necessary.  Modalities: ice pack on top and bottom of R knee, x 12 min   Therapeutic Exercise: Aerobic:  bike L1 x 5 min  Supine:   Seated:  LAQ 2.5 lb  2 x 10 bil;  Standing: Stretches:  Seated hamstring stretch 30 sec x 3 bil;  Standing gastroc stretch 30 sec x 3 bil;    LTR x 10 for low back;   pelvic tilts  supine x 10  (improved ability in sitting position)  Neuromuscular Re-education: Manual Therapy: STM to posterior knee , distal hamstring and proximal gastroc.  Therapeutic Activity: Self Care:   03/19/2023 Therapeutic Exercise: Aerobic: Supine: Seated:   Standing: Stretches:  Seated hamstring stretch 30 sec x 3 bil;  Standing gastroc stretch 30 sec x 3 bil;   LTR x 10 for low back;  pelvic tilts seated and supine x 15 ea ( improved ability in sitting position)  Neuromuscular Re-education: Manual Therapy: Therapeutic Activity: Self Care:   PATIENT EDUCATION:  Education details: updated and reviewed HEP Person educated: Patient Education method: Explanation, Demonstration, Tactile cues, Verbal cues, and Handouts Education comprehension: verbalized understanding, returned demonstration, verbal cues required, tactile cues required, and needs further education   HOME EXERCISE PROGRAM: Access Code: [email or web address]    ASSESSMENT:  CLINICAL IMPRESSION: 04/09/2023 Pt with tenderness in anterior and posterior knee with palpation today. She is frustrated with continued pain and increased pain in last few days. We discussed icing and home pain management. We also discussed getting appt with sports med if she feels necessary. She has been limited  with ability for activity and exercise due to continued pain.   Eval: Patient presents with primary complaint of  increased pain in R knee. She has most pain in posterior knee, and symptoms consistent with bakers cyst. She has had injection with slight improvement of pain. She also has other pain radiating into LEs at times, that is likely stemming from low back. She has had ongoing/intermittent back issues in the past , and will benefit from HEP for management of this as well. Pt with decreased ability for full functional activities, walking, and stairs, and will benefit from skilled PT to improve .   Pt with decreased ability for full functional activities. Pt will  benefit from skilled PT to improve deficits and pain and to return to PLOF.   OBJECTIVE IMPAIRMENTS: decreased activity tolerance, decreased mobility, decreased strength, increased muscle spasms, improper body mechanics, and pain.   ACTIVITY LIMITATIONS: bending, standing, squatting, stairs, and locomotion level  PARTICIPATION LIMITATIONS: meal prep, cleaning, driving, shopping, and community activity  PERSONAL FACTORS:  none  are also affecting patient's functional outcome.   REHAB POTENTIAL: Good  CLINICAL DECISION MAKING: Stable/uncomplicated  EVALUATION COMPLEXITY: Low   GOALS: Goals reviewed with patient? Yes  SHORT TERM GOALS: Target date: 04/02/2023  Pt to be independent with initial HEP  Goal status: MET    LONG TERM GOALS: Target date: 05/15/2023  Pt to be independent with final HEP  Goal status: INITIAL  2.  Pt to report decreased pain In knee to 0-2/10 with activity   Goal status: INITIAL  3.  Pt to demo ability for stair navigation with good safety and no pain, to improve ability for community activity   Goal status: INITIAL  4.  Pt to demo strength of R knee and hip to be wfl, to improve stability  and pain.   Goal status: INITIAL    PLAN:  PT FREQUENCY: 1-2x/week  PT DURATION: 8  weeks  PLANNED INTERVENTIONS: Therapeutic exercises, Therapeutic activity, Neuromuscular re-education, Patient/Family education, Self Care, Joint mobilization, Joint manipulation, Stair training, Orthotic/Fit training, DME instructions, Aquatic Therapy, Dry Needling, Electrical stimulation, Cryotherapy, Moist heat, Taping, Ultrasound, Ionotophoresis 4mg /ml Dexamethasone, Manual therapy,  Vasopneumatic device, Traction, Spinal manipulation, Spinal mobilization,Balance training, Gait training,   PLAN FOR NEXT SESSION:   Sedalia Muta, PT, DPT 9:11 AM  04/10/23  PHYSICAL THERAPY DISCHARGE SUMMARY  Visits from Start of Care: 5  Plan: Patient agrees to discharge.  Patient goals were not met. Patient is being discharged due to - returned to MD due to increased pain    Sedalia Muta, PT, DPT 9:57 AM  06/05/23

## 2023-04-11 ENCOUNTER — Other Ambulatory Visit: Payer: Self-pay

## 2023-04-11 ENCOUNTER — Encounter: Payer: Medicare Other | Admitting: Physical Therapy

## 2023-04-11 ENCOUNTER — Telehealth: Payer: Self-pay

## 2023-04-11 ENCOUNTER — Encounter: Payer: Self-pay | Admitting: Family Medicine

## 2023-04-11 ENCOUNTER — Ambulatory Visit (INDEPENDENT_AMBULATORY_CARE_PROVIDER_SITE_OTHER): Payer: Medicare Other | Admitting: Family Medicine

## 2023-04-11 VITALS — BP 130/84 | HR 84 | Ht 61.0 in | Wt 243.0 lb

## 2023-04-11 DIAGNOSIS — G8929 Other chronic pain: Secondary | ICD-10-CM | POA: Diagnosis not present

## 2023-04-11 DIAGNOSIS — M25561 Pain in right knee: Secondary | ICD-10-CM | POA: Diagnosis not present

## 2023-04-11 NOTE — Patient Instructions (Addendum)
Thank you for coming in today.   An MRI will tell us about worse arthritis than we can see on Xray or a meniscus tear or a subchondral stress or insufficiency fracture.   You should hear from MRI scheduling within 1 week. If you do not hear please let me know.    Anticipate gel injection after the MRI.  We will work on authorization now.

## 2023-04-11 NOTE — Progress Notes (Signed)
I, Linda Blackburn, CMA acting as a scribe for Linda Graham, MD.  Linda Blackburn is a 72 y.o. female who presents to [facility] at [facility] today for cont'd R knee pain. Pt was last seen by Dr. Denyse Blackburn in 2021 for this issue. Her PCP gave her a R knee steroid injection on 03/07/23 and she's completed a course of PT x5 visits.   Today, pt reports continued right knee pain. PT with Linda Blackburn has been helpful in the past. Pain with sit-to-stand. Ambulating with a limp. Feb/March 2024 she notes being unable to ambulate or WB d/t sx, at times. Notes short-term relief with steroid injection 5 weeks ago with PCP. PT has been less helpful this time around. Feels like a headache or toothache in the knee. The knee will lock up at times. Sx are constant. Pain radiating into the lower leg. PT and PCP have concerns of baker's cyst.   Swelling: yes  Dx imaging: 02/21/23 R knee XR  02/25/20 R knee XR  Pertinent review of systems: No fever or chills  Relevant historical information: Hypertension.  Chronic pancreatitis   Exam:  BP 130/84   Pulse 84   Ht 5\' 1"  (1.549 m)   Wt 243 lb (110.2 kg)   SpO2 97%   BMI 45.91 kg/m  General: Well Developed, well nourished, and in no acute distress.   MSK: Right knee mild effusion.  Normal motion.  Tender palpation medial and posterior knee. Range of motion limited flexion to about 100 degrees.  Full extension. Intact strength. Stable ligamentous exam. Positive medial McMurray's test.    Lab and Radiology Results  Diagnostic Limited MSK Ultrasound of: Right knee Quad tendon intact normal. Mild joint effusion is present. Patellar tendon intact and normal-appearing Mild degeneration medial meniscus.  Posterior knee no Baker's cyst. Impression: Medial degeneration.  EXAM: RIGHT KNEE - 1-2 VIEW   COMPARISON:  Radiograph 02/25/2020   FINDINGS: Mild tricompartmental peripheral spurring, no significant progression from prior exam.  The joint spaces are preserved. No fracture. No erosive change or focal bone abnormality. Trace knee joint effusion.   IMPRESSION: Mild tricompartmental osteoarthritis.  Small knee joint effusion.     Electronically Signed   By: Linda Blackburn M.D.   On: 02/26/2023 10:00 I, Linda Blackburn, personally (independently) visualized and performed the interpretation of the images attached in this note.   Assessment and Plan: 72 y.o. female with right knee pain.  Pain is primarily located at the posterior medial knee.  She has had a good trial already of conservative management including steroid injection and physical therapy. Concern for medial meniscus tear or exacerbation of medial compartment DJD or even a subchondral stress fracture.  Plan for MRI of the knee.  Recheck after MRI.  Will also work on authorization now for hyaluronic acid injections and Zilretta injection as that is likely to be a potential next step.  PDMP not reviewed this encounter. Orders Placed This Encounter  Procedures   [place] LIMITED JOINT SPACE STRUCTURES LOW RIGHT(NO LINKED CHARGES)    Order Specific Question:   Reason for Exam (SYMPTOM  OR DIAGNOSIS REQUIRED)    Answer:   right knee pain    Order Specific Question:   Preferred imaging location?    Answer:   [facility]   MR Knee Right Wo Contrast    Standing Status:   Future    Standing Expiration Date:   04/10/2024    Order Specific Question:  What is the patient's sedation requirement?    Answer:   No Sedation    Order Specific Question:   Does the patient have a pacemaker or implanted devices?    Answer:   No    Order Specific Question:   Preferred imaging location?    Answer:   [facility] (table limit-550lbs)   No orders of the defined types were placed in this encounter.    Discussed warning signs or symptoms. Please see discharge instructions. Patient expresses understanding.   The above documentation has been reviewed  and is accurate and complete Linda Blackburn, M.D.

## 2023-04-11 NOTE — Telephone Encounter (Signed)
Rodolph Bong, MD  Dierdre Searles, CMA Please Berkley Harvey Gel and Zilretta inejction right knee

## 2023-04-12 ENCOUNTER — Telehealth: Payer: Self-pay | Admitting: Family Medicine

## 2023-04-12 NOTE — Telephone Encounter (Signed)
Pt said that Dr. Denyse Amass contacted her yesterday with concerns about her having a history of reactions to gel products.  Pt wanted Dr. Denyse Amass to know that she did NOT have any reaction. She wonders if this product could be noted in her chart as NOT causing a reaction ( for future use ).

## 2023-04-13 ENCOUNTER — Encounter: Payer: Self-pay | Admitting: Family Medicine

## 2023-04-13 NOTE — Telephone Encounter (Signed)
Done

## 2023-04-17 ENCOUNTER — Telehealth: Payer: Self-pay

## 2023-04-17 NOTE — Telephone Encounter (Signed)
VOB initiated for ZILRETTA for RIGHT knee OA.   Also checking Unk Lightning

## 2023-04-17 NOTE — Telephone Encounter (Signed)
 VOB initiated for GELSYN for RIGHT knee OA.

## 2023-04-18 ENCOUNTER — Telehealth: Payer: Self-pay

## 2023-04-18 NOTE — Telephone Encounter (Signed)
Patient called stating the soonest she could get in for her MRI was early September and was wondering if anywhere else could get her in sooner. She is going to call [facility] and if they can she will call back so I can fax the order to them.

## 2023-04-18 NOTE — Telephone Encounter (Signed)
  Primary Insurance: Medicare     Secondary Insurance: CHAMPVA

## 2023-04-18 NOTE — Telephone Encounter (Signed)
GELSYN for RIGHT knee OA  Primary Insurance: Medicare Copay: n/a Co-insurance: 20% Deductible: covered by secondary Prior Auth: NOT required  Secondary Insurance: [place] Copay: n/a Co-insurance: covers Medicare co-insurance Deductible: covers Medicare deductible Prior Auth: NOT required   Knee Injection History: 03/07/23 Depo-Medrol RIGHT

## 2023-04-19 NOTE — Telephone Encounter (Signed)
Holing until after MRI on 9/3.

## 2023-04-23 ENCOUNTER — Telehealth: Payer: Self-pay | Admitting: Family Medicine

## 2023-04-23 NOTE — Telephone Encounter (Signed)
Patient called stating that her MRI is not scheduled until 9/3. At this time, she was advised to hold on the gel injection until after the MRI results. She said that she is having a lot of pain and wanted to know if she could go ahead and do the injection or if there was something else that Dr Denyse Amass could recommended while she is waiting.  She also asked if she could do an injection that was only one time rather than 3? (She is currently approved for Gelsyn) She was hopeful that one would provide more relief than a series.

## 2023-04-23 NOTE — Telephone Encounter (Signed)
Forwarding to Dr. Corey to review and advise.  

## 2023-04-24 NOTE — Telephone Encounter (Signed)
-  Zilretta has also been approved.

## 2023-04-24 NOTE — Telephone Encounter (Signed)
Holding

## 2023-04-24 NOTE — Telephone Encounter (Signed)
  Primary Insurance: Medicare     Secondary Insurance: CHAMPVA

## 2023-04-24 NOTE — Telephone Encounter (Addendum)
ZILRETTA for RIGHT knee OA  Primary Insurance: Medicare Co-pay: n/a Co-insurance: 20% Deductible: $240 of $240 met Prior Auth NOT required  Secondary Insurance: [place]AMPVA Co-pay: n/a Co-insurance: covers Medicare co-insurance of 20% Deductible: does not apply Prior Auth NOT required   Knee Injection History: 03/07/23 Depo-Medrol RIGHT

## 2023-04-24 NOTE — Telephone Encounter (Signed)
Supplies have been ordered.

## 2023-04-24 NOTE — Telephone Encounter (Signed)
Pt returned VM and discussed options w/ her. She reports so much pain w/ her R knee she's having difficulty completing ADLs. She was trying to wait until after the MRI scheduled for Sept 3rd, but is just in too much pain. Schedule her for a sooner f/u visit for Monday, 8/26.

## 2023-04-24 NOTE — Telephone Encounter (Signed)
We sure could start the gel series sooner than your MRI on September 3.  The single-dose gel shot does not work any better than the multiple dose gel shot.  We would have to reauthorize you for the single dose gel shot which will take about a week.  If you would like [place] to schedule you to start the Gelsyn series now I am certainly happy to if you would like [place] to work on authorization for the single dose gel shot we can do that as well.

## 2023-04-24 NOTE — Telephone Encounter (Signed)
Called [phone] and left VM to call the office.

## 2023-04-30 ENCOUNTER — Ambulatory Visit (INDEPENDENT_AMBULATORY_CARE_PROVIDER_SITE_OTHER): Payer: Medicare Other | Admitting: Family Medicine

## 2023-04-30 ENCOUNTER — Other Ambulatory Visit: Payer: Self-pay

## 2023-04-30 VITALS — BP 142/90 | HR 82 | Ht 61.0 in | Wt 243.0 lb

## 2023-04-30 DIAGNOSIS — M25561 Pain in right knee: Secondary | ICD-10-CM | POA: Diagnosis not present

## 2023-04-30 DIAGNOSIS — G8929 Other chronic pain: Secondary | ICD-10-CM | POA: Diagnosis not present

## 2023-04-30 DIAGNOSIS — M1711 Unilateral primary osteoarthritis, right knee: Secondary | ICD-10-CM | POA: Diagnosis not present

## 2023-04-30 MED ORDER — GABAPENTIN 100 MG PO CAPS: 100.0000 mg | ORAL_CAPSULE | Freq: Every evening | ORAL | 3 refills | Status: DC | PRN

## 2023-04-30 MED ORDER — SODIUM HYALURONATE (VISCOSUP) 16.8 MG/2ML IX SOSY
16.8000 mg | PREFILLED_SYRINGE | Freq: Once | INTRA_ARTICULAR | Status: AC
  Administered 2023-04-30: 16.8 mg via INTRA_ARTICULAR

## 2023-04-30 NOTE — Progress Notes (Signed)
Rubin Payor, PhD, LAT, ATC acting as a scribe for Linda Graham, MD.  Linda Blackburn is a 72 y.o. female who presents to [facility] at [facility] today for cont'd R knee pain. Pt was last seen by Dr. Denyse Amass on 04/11/23 and a MRI was ordered while we worked to authorize hyaluronic acid and Zilretta injections. MRI is scheduled for Sept 3rd.  Today, pt reports she has just been miserable w/ her R knee pain. She has a friend who go the single dose gel shot, which is what prompted her inquiry. She is just wanting some relief.   She notes pain in the anterior medial and lateral knee.  She also has pain especially at night radiating down the lateral aspect of her leg.  Dx imaging: R knee MRI---- pending 02/21/23 R knee XR             02/25/20 R knee XR   Pertinent review of systems: No fevers or chills  Relevant historical information: Hypertension   Exam:  BP (!) 142/90   Pulse 82   Ht 5\' 1"  (1.549 m)   Wt 243 lb (110.2 kg)   SpO2 94%   BMI 45.91 kg/m  General: Well Developed, well nourished, and in no acute distress.   MSK: Right knee minimal effusion normal motion with crepitation.    Lab and Radiology Results  [name] presents to clinic today for Gelsyn injection right knee 1/3 Procedure: Real-time Ultrasound Guided Injection of right knee joint superior lateral patellar space Device: Philips Affiniti 50G/GE Logiq Images permanently stored and available for review in PACS Verbal informed consent obtained.  Discussed risks and benefits of procedure. Warned about infection, bleeding, damage to structures among others. Patient expresses understanding and agreement Time-out conducted.   Noted no overlying erythema, induration, or other signs of local infection.   Skin prepped in a sterile fashion.   Local anesthesia: Topical Ethyl chloride.   With sterile technique and under real time ultrasound guidance: Right liters injected into knee  joint. Fluid seen entering the joint capsule.   Completed without difficulty   Advised to call if fevers/chills, erythema, induration, drainage, or persistent bleeding.   Images permanently stored and available for review in the ultrasound unit.  Impression: Technically successful ultrasound guided injection. Lot number: [id number]      Assessment and Plan: 72 y.o. female with right knee pain.  She has mild arthritis on x-ray.  Steroid injection is not effective.  Plan for 3 dose Gelsyn series starting today.  Return next week for Gelsyn injection 2/3.  MRI is also scheduled for next week which should be helpful and may change her plan.  I think perhaps the pain radiating down the lateral leg could be lumbar radiculopathy.  Plan for trial of gabapentin at bedtime.  Recheck in 1 week.   PDMP not reviewed this encounter. Orders Placed This Encounter  Procedures   [place] LIMITED JOINT SPACE STRUCTURES LOW RIGHT(NO LINKED CHARGES)    Order Specific Question:   Reason for Exam (SYMPTOM  OR DIAGNOSIS REQUIRED)    Answer:   right knee pain    Order Specific Question:   Preferred imaging location?    Answer:   [facility] this encounter  Medications   sodium hyaluronate (viscosup) (GELSYN-3) intra-articular injection 16.8 mg   gabapentin (NEURONTIN) 100 MG capsule    Sig: Take 1-3 capsules (100-300 mg total) by mouth at bedtime as  needed.    Dispense:  90 capsule    Refill:  3     Discussed warning signs or symptoms. Please see discharge instructions. Patient expresses understanding.   The above documentation has been reviewed and is accurate and complete Linda Blackburn, M.D.

## 2023-04-30 NOTE — Patient Instructions (Addendum)
Thank you for coming in today.   You received an injection today. Seek immediate medical attention if the joint becomes red, extremely painful, or is oozing fluid.   Schedule the 2nd Gelsyn injection for next Tuesday and the 3rd for the week after that.   Try gabapentin at bedtime as needed for that nerve leg pain.

## 2023-05-08 ENCOUNTER — Ambulatory Visit
Admission: RE | Admit: 2023-05-08 | Discharge: 2023-05-08 | Disposition: A | Discharge status: Home / Self Care | Payer: Medicare Other | Source: Ambulatory Visit | Attending: Family Medicine | Admitting: Family Medicine

## 2023-05-08 ENCOUNTER — Encounter: Payer: Self-pay | Admitting: Family Medicine

## 2023-05-08 ENCOUNTER — Telehealth: Payer: Self-pay

## 2023-05-08 ENCOUNTER — Ambulatory Visit: Payer: Self-pay

## 2023-05-08 ENCOUNTER — Ambulatory Visit (INDEPENDENT_AMBULATORY_CARE_PROVIDER_SITE_OTHER): Payer: Medicare Other | Admitting: Family Medicine

## 2023-05-08 VITALS — BP 124/80 | HR 90 | Ht 61.0 in

## 2023-05-08 DIAGNOSIS — M79662 Pain in left lower leg: Secondary | ICD-10-CM

## 2023-05-08 DIAGNOSIS — M25561 Pain in right knee: Secondary | ICD-10-CM | POA: Diagnosis not present

## 2023-05-08 DIAGNOSIS — M25562 Pain in left knee: Secondary | ICD-10-CM | POA: Diagnosis not present

## 2023-05-08 DIAGNOSIS — G8929 Other chronic pain: Secondary | ICD-10-CM | POA: Diagnosis not present

## 2023-05-08 DIAGNOSIS — M23321 Other meniscus derangements, posterior horn of medial meniscus, right knee: Secondary | ICD-10-CM | POA: Diagnosis not present

## 2023-05-08 DIAGNOSIS — M7989 Other specified soft tissue disorders: Secondary | ICD-10-CM | POA: Diagnosis not present

## 2023-05-08 DIAGNOSIS — M1711 Unilateral primary osteoarthritis, right knee: Secondary | ICD-10-CM | POA: Diagnosis not present

## 2023-05-08 DIAGNOSIS — M25461 Effusion, right knee: Secondary | ICD-10-CM | POA: Diagnosis not present

## 2023-05-08 MED ORDER — SODIUM HYALURONATE (VISCOSUP) 16.8 MG/2ML IX SOSY
16.8000 mg | PREFILLED_SYRINGE | Freq: Once | INTRA_ARTICULAR | Status: AC
  Administered 2023-05-08: 16.8 mg via INTRA_ARTICULAR

## 2023-05-08 NOTE — Telephone Encounter (Signed)
No pre cert required for VAS [place]

## 2023-05-08 NOTE — Patient Instructions (Addendum)
Thank you for coming in today.   You received an injection today. Seek immediate medical attention if the joint becomes red, extremely painful, or is oozing fluid.   We will see you next week for the 3rd Gelsyn injection  You have a Vascular Ultrasound scheduled for tomorrow, Sept 4th at 2:30:  [facility] [address] [place], [place] [address] Phone: [phone]

## 2023-05-08 NOTE — Progress Notes (Signed)
I, Stevenson Clinch, CMA acting as a scribe for Clementeen Graham, MD.  Linda Blackburn is a 72 y.o. female who presents to [facility] at [facility] today for the 2nd Gelsyn injection in her R knee.  Pt also c/o LEFT knee pain x 1 day. Sx started after stepping up from the bed yesterday morning. Swelling present. Limited ROM. Ambulating with abnormal gait, refuses wheelchair to ambulate to exam room. Pain radiating into the lower leg.   Gelsyn inj 1 was helpful for OA sx, was able to get some things done around the house. Notes worsening back pain x 1-2 days after doing work around the house.   Low back pain: yes Radiating pain: lower left leg LE numbness/tingling: no LE weakness: yes Aggravates: flexion, WB Treatments tried: rest  Pertinent review of systems: No fevers or chills  Relevant historical information: Hypertension.  Chronic pancreatitis.   Exam:  BP 124/80   Pulse 90   Ht 5\' 1"  (1.549 m)   SpO2 98%   BMI 45.91 kg/m  General: Well Developed, well nourished, and in no acute distress.   MSK: Right knee no effusion normal motion nontender. Left knee mild effusion decreased range of motion pain with flexion.  Tender palpation medial and lateral knee. Calf slight swelling and some pain with calf squeeze.  L-spine nontender to palpation normal lumbar motion.  Antalgic gait.    Lab and Radiology Results  Linda Blackburn presents to clinic today for Gelsyn injection right knee 2/3 Procedure: Real-time Ultrasound Guided Injection of right knee joint superior lateral patellar space Device: Philips Affiniti 50G/GE Logiq Images permanently stored and available for review in PACS Verbal informed consent obtained.  Discussed risks and benefits of procedure. Warned about infection, bleeding, damage to structures among others. Patient expresses understanding and agreement Time-out conducted.   Noted no overlying erythema, induration, or other signs  of local infection.   Skin prepped in a sterile fashion.   Local anesthesia: Topical Ethyl chloride.   With sterile technique and under real time ultrasound guidance: Gelsyn 2 mL injected into knee joint. Fluid seen entering the joint capsule.   Completed without difficulty   Advised to call if fevers/chills, erythema, induration, drainage, or persistent bleeding.   Images permanently stored and available for review in the ultrasound unit.  Impression: Technically successful ultrasound guided injection. Lot number: [id number]  Procedure: Real-time Ultrasound Guided Injection of left knee joint superior lateral patella space Device: Philips Affiniti 50G/GE Logiq Images permanently stored and available for review in PACS Verbal informed consent obtained.  Discussed risks and benefits of procedure. Warned about infection, bleeding, hyperglycemia damage to structures among others. Patient expresses understanding and agreement Time-out conducted.   Noted no overlying erythema, induration, or other signs of local infection.   Skin prepped in a sterile fashion.   Local anesthesia: Topical Ethyl chloride.   With sterile technique and under real time ultrasound guidance: 40 mg of Kenalog and 2 mL of Marcaine injected into knee joint. Fluid seen entering the joint capsule.   Completed without difficulty   Pain moderately resolved suggesting accurate placement of the medication.   Advised to call if fevers/chills, erythema, induration, drainage, or persistent bleeding.   Images permanently stored and available for review in the ultrasound unit.  Impression: Technically successful ultrasound guided injection.       Assessment and Plan: 72 y.o. female with chronic right knee pain due to DJD.  Patient was scheduled today for Gelsyn injection  right knee 2/3 which I performed.  However she has left knee or lower leg pain.  The etiology is a bit unclear.  Exacerbation of DJD is the most likely  explanation.  Lumbar radiculopathy is also a possibility as is DVT.  Performed an intra-articular left knee injection which seemed to help.  Additionally I ordered a vascular ultrasound to assess for DVT and she is scheduled tomorrow for this test in the afternoon.  She is scheduled for recheck with me in 1 week.  She will check back or contact me sooner if needed.   PDMP not reviewed this encounter. Orders Placed This Encounter  Procedures   [place] LIMITED JOINT SPACE STRUCTURES LOW RIGHT(NO LINKED CHARGES)    Order Specific Question:   Reason for Exam (SYMPTOM  OR DIAGNOSIS REQUIRED)    Answer:   right knee pain    Order Specific Question:   Preferred imaging location?    Answer:   Adult [facility] ordered this encounter  Medications   sodium hyaluronate (viscosup) (GELSYN-3) intra-articular injection 16.8 mg     Discussed warning signs or symptoms. Please see discharge instructions. Patient expresses understanding.   The above documentation has been reviewed and is accurate and complete Clementeen Graham, M.D.

## 2023-05-09 ENCOUNTER — Ambulatory Visit (HOSPITAL_COMMUNITY)
Admission: RE | Admit: 2023-05-09 | Discharge: 2023-05-09 | Disposition: A | Discharge status: Home / Self Care | Payer: Medicare Other | Source: Ambulatory Visit | Attending: Cardiovascular Disease | Admitting: Cardiovascular Disease

## 2023-05-09 DIAGNOSIS — M7989 Other specified soft tissue disorders: Secondary | ICD-10-CM | POA: Insufficient documentation

## 2023-05-09 DIAGNOSIS — M79662 Pain in left lower leg: Secondary | ICD-10-CM | POA: Diagnosis not present

## 2023-05-09 NOTE — Telephone Encounter (Addendum)
Gelsyn Injection schedule: #1 04/30/23 #2 05/08/23 #3 05/15/23

## 2023-05-11 NOTE — Progress Notes (Signed)
Ultrasound shows no evidence of DVT.  It does look like you may have a ganglion cyst in the back of the left knee.

## 2023-05-14 NOTE — Progress Notes (Unsigned)
Rubin Payor, PhD, LAT, ATC acting as a scribe for Clementeen Graham, MD.  Linda Blackburn is a 72 y.o. female who presents to [facility] at [facility] today for her 3rd Gelsyn injection, R knee, and MRI review. Pt was seen last week for the 2nd Gelsyn injection, 2/3. Today, pt reports she is wanting to go over her MRI. She c/o her L knee being very painful. Locating pain to the anterior-medial aspect of her L knee and into proximal tibia.   Dx imaging: 05/08/23 R knee MRI 02/21/23 R & L knee XR             02/25/20 R knee XR  Pertinent review of systems: No fevers or chills  Relevant historical information: Hypertension   Exam:  BP (!) 142/90   Pulse 82   Ht 5\' 1"  (1.549 m)   Wt 234 lb (106.1 kg)   SpO2 98%   BMI 44.21 kg/m  General: Well Developed, well nourished, and in no acute distress.   MSK: Right knee minimal effusion normal motion.  Nontender.  Left knee moderate effusion decreased range of motion tender palpation medial and lateral joint line.    Lab and Radiology Results  Linda Blackburn presents to clinic today for Gelsyn injection right knee 3/3 Procedure: Real-time Ultrasound Guided Injection of right knee joint superior lateral patellar space Device: Philips Affiniti 50G/GE Logiq Images permanently stored and available for review in PACS Verbal informed consent obtained.  Discussed risks and benefits of procedure. Warned about infection, bleeding, damage to structures among others. Patient expresses understanding and agreement Time-out conducted.   Noted no overlying erythema, induration, or other signs of local infection.   Skin prepped in a sterile fashion.   Local anesthesia: Topical Ethyl chloride.   With sterile technique and under real time ultrasound guidance: Gelsyn 2 mL injected into knee joint. Fluid seen entering the joint capsule.   Completed without difficulty   Advised to call if fevers/chills, erythema, induration,  drainage, or persistent bleeding.   Images permanently stored and available for review in the ultrasound unit.  Impression: Technically successful ultrasound guided injection. Lot number: [id number]  Linda Blackburn presents to clinic today for Gelsyn injection left knee 1/3 Procedure: Real-time Ultrasound Guided Injection of left knee joint superior lateral patellar space Device: Philips Affiniti 50G/GE Logiq Images permanently stored and available for review in PACS Verbal informed consent obtained.  Discussed risks and benefits of procedure. Warned about infection, bleeding, damage to structures among others. Patient expresses understanding and agreement Time-out conducted.   Noted no overlying erythema, induration, or other signs of local infection.   Skin prepped in a sterile fashion.   Local anesthesia: Topical Ethyl chloride.   With sterile technique and under real time ultrasound guidance: Gelsyn injected into knee joint. Fluid seen entering the joint capsule.   Completed without difficulty   Advised to call if fevers/chills, erythema, induration, drainage, or persistent bleeding.   Images permanently stored and available for review in the ultrasound unit.  Impression: Technically successful ultrasound guided injection. Lot number: [id number]    EXAM: MRI OF THE RIGHT KNEE WITHOUT CONTRAST   TECHNIQUE: Multiplanar, multisequence MR imaging of the knee was performed. No intravenous contrast was administered.   COMPARISON:  Radiographs 02/21/2023   FINDINGS: MENISCI   Medial meniscus: There is a radial tear involving the root of the posterior horn with resulting incomplete meniscal detachment. The medial meniscus is moderately extruded peripherally from the  joint space. No centrally displaced meniscal fragments are identified.   Lateral meniscus:  Intact with normal morphology.   LIGAMENTS   Cruciates: The anterior and posterior cruciate ligaments are intact.    Collaterals: The medial and lateral collateral ligament complexes are intact.   CARTILAGE   Patellofemoral: Moderate patellofemoral degenerative changes with chondral thinning, osteophytes and subchondral cyst formation, greatest in the lateral patellar facet.   Medial: Mild to moderate chondral thinning, surface irregularity and osteophyte formation. No full-thickness chondral defect.   Lateral:  Mild chondral thinning without focal chondral defect.   MISCELLANEOUS   Joint: Small to moderate knee joint effusion. No evidence of intra-articular loose body.   Popliteal Fossa: The popliteus muscle and tendon are intact. Small Baker's cyst appears partially ruptured with surrounding soft tissue edema tracking into the lower leg along the superficial aspect of the medial head of the gastrocnemius muscle.   Extensor Mechanism: The visualized quadriceps and patellar tendons are intact.   Bones:  No acute or significant extra-articular osseous findings.   Other: No other significant periarticular soft tissue findings.   IMPRESSION: 1. Radial tear involving the root of the posterior horn of the medial meniscus with resulting peripheral extrusion of the meniscus from the joint space. 2. The lateral meniscus, cruciate and collateral ligaments are intact. 3. Tricompartmental degenerative changes, most advanced in the patellofemoral compartment. No acute osseous findings. 4. Small to moderate knee joint effusion and small partially ruptured Baker's cyst.     Electronically Signed   By: Carey Bullocks M.D.   On: 05/14/2023 14:50 I, Clementeen Graham, personally (independently) visualized and performed the interpretation of the images attached in this note.      Assessment and Plan: 72 y.o. female with chronic right knee pain. Improving with hyaluronic acid injection series.  Today is her third and final gel injection right knee.  Seems to be working pretty well.  Watchful waiting  after today's injection. We did review the right knee MRI which does show predominantly arthritis especially in the patellofemoral and medial compartment as well as a degenerative posterior medial meniscus tear.  If ultimately injections are not successful she may benefit from surgical consultation.  Left knee pain previously this was the less painful side.  Over the last 2 weeks the pain is escalated.  We tried a steroid injection last week which did not work.  Today were able to get her authorized for gel injection left knee and we will proceed with injection 1 of 3 starting today.  Return in 1 week for gel injection left knee 2/3 in 1 week.     In addition to the knee pain she does have some pain radiating down the left leg at the lateral calf that I think may be lumbar radiculopathy.  Gabapentin has been helpful for this thankfully.  PDMP not reviewed this encounter. Orders Placed This Encounter  Procedures   [place] LIMITED JOINT SPACE STRUCTURES LOW RIGHT(NO LINKED CHARGES)    Order Specific Question:   Reason for Exam (SYMPTOM  OR DIAGNOSIS REQUIRED)    Answer:   right knee pain    Order Specific Question:   Preferred imaging location?    Answer:   Adult nurse [facility] ordered this encounter  Medications   sodium hyaluronate (viscosup) (GELSYN-3) intra-articular injection 16.8 mg   sodium hyaluronate (viscosup) (GELSYN-3) intra-articular injection 16.8 mg     Discussed warning signs or symptoms. Please see discharge instructions. Patient expresses understanding.  The above documentation has been reviewed and is accurate and complete Clementeen Graham, M.D.

## 2023-05-15 ENCOUNTER — Ambulatory Visit (INDEPENDENT_AMBULATORY_CARE_PROVIDER_SITE_OTHER): Payer: Medicare Other | Admitting: Family Medicine

## 2023-05-15 ENCOUNTER — Other Ambulatory Visit: Payer: Self-pay

## 2023-05-15 VITALS — BP 142/90 | HR 82 | Ht 61.0 in | Wt 234.0 lb

## 2023-05-15 DIAGNOSIS — M1711 Unilateral primary osteoarthritis, right knee: Secondary | ICD-10-CM | POA: Diagnosis not present

## 2023-05-15 DIAGNOSIS — M25561 Pain in right knee: Secondary | ICD-10-CM

## 2023-05-15 DIAGNOSIS — M1712 Unilateral primary osteoarthritis, left knee: Secondary | ICD-10-CM | POA: Diagnosis not present

## 2023-05-15 DIAGNOSIS — M25562 Pain in left knee: Secondary | ICD-10-CM | POA: Diagnosis not present

## 2023-05-15 DIAGNOSIS — G8929 Other chronic pain: Secondary | ICD-10-CM | POA: Diagnosis not present

## 2023-05-15 MED ORDER — SODIUM HYALURONATE (VISCOSUP) 16.8 MG/2ML IX SOSY
16.8000 mg | PREFILLED_SYRINGE | Freq: Once | INTRA_ARTICULAR | Status: AC
Start: 2023-05-15 — End: 2023-05-15
  Administered 2023-05-15: 16.8 mg via INTRA_ARTICULAR

## 2023-05-15 NOTE — Progress Notes (Signed)
Knee MRI shows a meniscus tear at the medial meniscus and some areas of relatively bad arthritis in the knee.  There is also some fluid in the knee joint and behind the knee as well.  All of this contributions to your pain.  Will talk about this in full detail on your return visit today at 2 PM.

## 2023-05-15 NOTE — Patient Instructions (Addendum)
Thank you for coming in today.   You received an injection today. Seek immediate medical attention if the joint becomes red, extremely painful, or is oozing fluid.   We will see you next week for the 2nd Gelsyn injection in your LEFT knee. Scheduled the 3rd shot for the week after that.

## 2023-05-16 NOTE — Telephone Encounter (Signed)
Pt completed Gelsyn series on 05/15/23. Can consider repeat injection(s) on or after 11/13/23.

## 2023-05-22 ENCOUNTER — Other Ambulatory Visit: Payer: Self-pay

## 2023-05-22 ENCOUNTER — Ambulatory Visit (INDEPENDENT_AMBULATORY_CARE_PROVIDER_SITE_OTHER): Payer: Medicare Other | Admitting: Family Medicine

## 2023-05-22 DIAGNOSIS — M1712 Unilateral primary osteoarthritis, left knee: Secondary | ICD-10-CM

## 2023-05-22 DIAGNOSIS — G8929 Other chronic pain: Secondary | ICD-10-CM | POA: Diagnosis not present

## 2023-05-22 DIAGNOSIS — M25562 Pain in left knee: Secondary | ICD-10-CM

## 2023-05-22 MED ORDER — SODIUM HYALURONATE (VISCOSUP) 16.8 MG/2ML IX SOSY
16.8000 mg | PREFILLED_SYRINGE | Freq: Once | INTRA_ARTICULAR | Status: AC
Start: 2023-05-22 — End: 2023-05-22
  Administered 2023-05-22: 16.8 mg via INTRA_ARTICULAR

## 2023-05-22 MED ORDER — KETOROLAC TROMETHAMINE 30 MG/ML IJ SOLN
30.0000 mg | Freq: Once | INTRAMUSCULAR | Status: AC
Start: 2023-05-22 — End: 2023-05-22
  Administered 2023-05-22: 30 mg via INTRAMUSCULAR

## 2023-05-22 NOTE — Patient Instructions (Addendum)
Thank you for coming in today.   You received an injection today. Seek immediate medical attention if the joint becomes red, extremely painful, or is oozing fluid.   We will see you next week for the 3rd Gelsyn injection (along with [place])

## 2023-05-22 NOTE — Progress Notes (Signed)
Linda Blackburn presents to clinic today for Gelsyn injection left knee 2/3 Procedure: Real-time Ultrasound Guided Injection of left knee joint superior lateral patellar space Device: Philips Affiniti 50G/GE Logiq Images permanently stored and available for review in PACS Verbal informed consent obtained.  Discussed risks and benefits of procedure. Warned about infection, bleeding, damage to structures among others. Patient expresses understanding and agreement Time-out conducted.   Noted no overlying erythema, induration, or other signs of local infection.   Skin prepped in a sterile fashion.   Local anesthesia: Topical Ethyl chloride.   With sterile technique and under real time ultrasound guidance: Gelsyn 2 mL injected into knee joint. Fluid seen entering the joint capsule.   Completed without difficulty   Advised to call if fevers/chills, erythema, induration, drainage, or persistent bleeding.   Images permanently stored and available for review in the ultrasound unit.  Impression: Technically successful ultrasound guided injection. Lot number: [id number]  She is having a fair amount of pain continuously in her left knee.  She is doing a lot of activities this afternoon including shopping.  She would like some sort of injection to help with pain.  After discussion plan for Toradol 30 mg injection prior to discharge.  GFR greater than 60 when last checked January 2024

## 2023-05-25 NOTE — Telephone Encounter (Signed)
Scheduled to completed Gelsyn series for BILAT knee OA on 05/29/23.

## 2023-05-29 ENCOUNTER — Other Ambulatory Visit: Payer: Self-pay

## 2023-05-29 ENCOUNTER — Ambulatory Visit: Payer: Medicare Other | Admitting: Family Medicine

## 2023-05-29 DIAGNOSIS — M1712 Unilateral primary osteoarthritis, left knee: Secondary | ICD-10-CM | POA: Diagnosis not present

## 2023-05-29 DIAGNOSIS — G8929 Other chronic pain: Secondary | ICD-10-CM

## 2023-05-29 DIAGNOSIS — M25562 Pain in left knee: Secondary | ICD-10-CM | POA: Diagnosis not present

## 2023-05-29 MED ORDER — SODIUM HYALURONATE (VISCOSUP) 16.8 MG/2ML IX SOSY
16.8000 mg | PREFILLED_SYRINGE | Freq: Once | INTRA_ARTICULAR | Status: AC
Start: 2023-05-29 — End: 2023-05-29
  Administered 2023-05-29: 16.8 mg via INTRA_ARTICULAR

## 2023-05-29 MED ORDER — KETOROLAC TROMETHAMINE 60 MG/2ML IM SOLN
60.0000 mg | Freq: Once | INTRAMUSCULAR | Status: AC
Start: 2023-05-29 — End: 2023-05-29
  Administered 2023-05-29: 60 mg via INTRAMUSCULAR

## 2023-05-29 NOTE — Patient Instructions (Signed)
Thank you for coming in today.   Call or go to the ER if you develop a large red swollen joint with extreme pain or oozing puss.    We are working on [employer].   You should hear from MRI scheduling within 1 week. If you do not hear please let me know.

## 2023-05-29 NOTE — Progress Notes (Unsigned)
Linda Blackburn presents to clinic today for Gelsyn injection left knee 3/3 Procedure: Real-time Ultrasound Guided Injection of left knee joint superior lateral patellar space Device: Philips Affiniti 50G/GE Logiq Images permanently stored and available for review in PACS Verbal informed consent obtained.  Discussed risks and benefits of procedure. Warned about infection, bleeding, damage to structures among others. Patient expresses understanding and agreement Time-out conducted.   Noted no overlying erythema, induration, or other signs of local infection.   Skin prepped in a sterile fashion.   Local anesthesia: Topical Ethyl chloride.   With sterile technique and under real time ultrasound guidance: Gelsyn 2 mL injected into knee joint. Fluid seen entering the joint capsule.   Completed without difficulty   Advised to call if fevers/chills, erythema, induration, drainage, or persistent bleeding.   Images permanently stored and available for review in the ultrasound unit.  Impression: Technically successful ultrasound guided injection. Lot number: [id number]  Of note she is having continued pain.  Toradol injection given IM prior to discharge.  Additionally her pain is out of proportion to her mild arthritis seen on previous x-ray.  Plan for MRI to further characterize source of pain and for surgical or future injection planning.  Also working on authorization for [employer] which we can give either before or after the MRI based on her symptoms.

## 2023-05-31 ENCOUNTER — Telehealth: Payer: Self-pay

## 2023-05-31 NOTE — Telephone Encounter (Signed)
A user error has taken place: encounter opened in error, closed for administrative reasons.

## 2023-05-31 NOTE — Telephone Encounter (Signed)
Check benefits for Zilretta BILAT knees

## 2023-05-31 NOTE — Telephone Encounter (Signed)
-----   Message from Clementeen Graham sent at 05/29/2023  1:42 PM EDT ----- Regarding: zilretta Please Mason Jim injection left knee

## 2023-06-01 NOTE — Telephone Encounter (Signed)
VOB initiated for Zilretta for BILAT knee OA 

## 2023-06-03 ENCOUNTER — Ambulatory Visit: Payer: Medicare Other

## 2023-06-03 DIAGNOSIS — M25562 Pain in left knee: Secondary | ICD-10-CM

## 2023-06-03 DIAGNOSIS — M25462 Effusion, left knee: Secondary | ICD-10-CM | POA: Diagnosis not present

## 2023-06-03 DIAGNOSIS — M7122 Synovial cyst of popliteal space [Baker], left knee: Secondary | ICD-10-CM | POA: Diagnosis not present

## 2023-06-03 DIAGNOSIS — G8929 Other chronic pain: Secondary | ICD-10-CM

## 2023-06-03 DIAGNOSIS — S83242A Other tear of medial meniscus, current injury, left knee, initial encounter: Secondary | ICD-10-CM | POA: Diagnosis not present

## 2023-06-03 DIAGNOSIS — M1712 Unilateral primary osteoarthritis, left knee: Secondary | ICD-10-CM | POA: Diagnosis not present

## 2023-06-04 ENCOUNTER — Other Ambulatory Visit: Payer: Self-pay | Admitting: Family Medicine

## 2023-06-04 NOTE — Telephone Encounter (Signed)
Prescription Request  06/04/2023  LOV: 03/07/2023  What is the name of the medication or equipment? zolpidem (AMBIEN) 5 MG tablet   Have you contacted your pharmacy to request a refill? No   Which pharmacy would you like this sent to?   [id number] [place], [place] - [address] AT [facility] OF [facility] [address] & [facility] [address] [place] [address] Phone: [phone] Fax: [phone]    Patient notified that their request is being sent to the clinical staff for review and that they should receive a response within 2 business days.   Please advise at Mobile [phone] (mobile)

## 2023-06-05 MED ORDER — ZOLPIDEM TARTRATE 5 MG PO TABS: 5.0000 mg | ORAL_TABLET | Freq: Every evening | ORAL | 0 refills | Status: DC | PRN

## 2023-06-05 NOTE — Telephone Encounter (Addendum)
ZILRETTA for BILAT knee OA   Primary Insurance: Medicare Co-pay: n/a Co-insurance: 20% Deductible: $240 of $240 met Prior Auth NOT required   Secondary Insurance: [place] Co-pay: n/a Co-insurance: covers Medicare co-insurance of 20% Deductible: does not apply Prior Auth NOT required   Knee Injection History: 03/07/23 Depo-Medrol RIGHT 04/30/23 Gelsyn #1 RIGHT 05/08/23 Gelsyn #2 RIGHT 05/15/23 Gelsyn #3 BILAT 05/22/23 - Gelsyn #2 LEFT, Toradol RIGHT 05/29/23 Unk Lightning #3 LEFT

## 2023-06-05 NOTE — Telephone Encounter (Signed)
NO Prior Auth required for [employer]

## 2023-06-06 NOTE — Telephone Encounter (Signed)
Completed Gelsyn series on 9/24.

## 2023-06-07 ENCOUNTER — Other Ambulatory Visit: Payer: Self-pay | Admitting: Family Medicine

## 2023-06-07 ENCOUNTER — Telehealth: Payer: Self-pay

## 2023-06-07 DIAGNOSIS — Z1231 Encounter for screening mammogram for malignant neoplasm of breast: Secondary | ICD-10-CM

## 2023-06-07 NOTE — Telephone Encounter (Signed)
A user error has taken place: encounter opened in error, closed for administrative reasons.

## 2023-06-07 NOTE — Telephone Encounter (Signed)
-----   Message from Linda Blackburn sent at 05/29/2023  1:42 PM EDT ----- Regarding: zilretta Please Mason Jim injection left knee

## 2023-06-08 ENCOUNTER — Telehealth: Payer: Self-pay

## 2023-06-08 ENCOUNTER — Ambulatory Visit (INDEPENDENT_AMBULATORY_CARE_PROVIDER_SITE_OTHER): Payer: Medicare Other

## 2023-06-08 DIAGNOSIS — Z23 Encounter for immunization: Secondary | ICD-10-CM | POA: Diagnosis not present

## 2023-06-08 NOTE — Telephone Encounter (Signed)
Pt came in for flu shot today. Pt would like to know if provider recommends her to get a covid shot this year. Please advise.

## 2023-06-18 NOTE — Progress Notes (Signed)
Left knee MRI shows tear at the root of the medial meniscus at the back of the knee and a possible subchondral insufficiency fracture.  There is arthritis as well in the knee.  Recommend return to clinic to talk about the results in full detail.

## 2023-06-19 NOTE — Telephone Encounter (Signed)
Spoke to Linda Blackburn. Linda Blackburn reports she went ahead and got it yesterday. Date is recorded and updated to Linda Blackburn's chart.

## 2023-06-20 ENCOUNTER — Ambulatory Visit (INDEPENDENT_AMBULATORY_CARE_PROVIDER_SITE_OTHER): Payer: Medicare Other | Admitting: Family Medicine

## 2023-06-20 ENCOUNTER — Encounter: Payer: Self-pay | Admitting: Family Medicine

## 2023-06-20 VITALS — BP 130/80 | HR 72 | Ht 61.0 in | Wt 240.0 lb

## 2023-06-20 DIAGNOSIS — M25562 Pain in left knee: Secondary | ICD-10-CM | POA: Diagnosis not present

## 2023-06-20 DIAGNOSIS — M25561 Pain in right knee: Secondary | ICD-10-CM

## 2023-06-20 DIAGNOSIS — G8929 Other chronic pain: Secondary | ICD-10-CM

## 2023-06-20 NOTE — Progress Notes (Signed)
I, Stevenson Clinch, CMA acting as a scribe for Clementeen Graham, MD.  Linda Blackburn is a 72 y.o. female who presents to [facility] at [facility] today for f/u L knee pain w/ MRI review. Pt was last seen by Dr. Denyse Amass on 05/29/23 and completed the Gelsyn series, 3/3, in her L knee and she was given a Toradol IM injection.   Today, pt reports some improvement s/p injections. Feels like ROM is decreased, feels like she is "walking on wooden legs". Intermitent aching, worse after being more active. Review MRI today.   Dx imaging: 06/03/23 L knee MRI  02/21/23 L knee XR  Pertinent review of systems: No fevers or chills  Relevant historical information: Hypertension   Exam:  BP 130/80   Pulse 72   Ht 5\' 1"  (1.549 m)   Wt 240 lb (108.9 kg)   SpO2 97%   BMI 45.35 kg/m  General: Well Developed, well nourished, and in no acute distress.   MSK: Left knee minimal effusion normal motion.  Tender palpation medial joint line.  Right knee normal-appearing normal motion tender palpation medial joint line.    Lab and Radiology Results   EXAM: MRI OF THE LEFT KNEE WITHOUT CONTRAST   TECHNIQUE: Multiplanar, multisequence MR imaging of the knee was performed. No intravenous contrast was administered.   COMPARISON:  Radiograph 02/21/2023   FINDINGS: MENISCI   Medial meniscus: Large root tear of the posterior horn with resulting moderate peripheral extrusion of the meniscus from the joint space. No centrally displaced meniscal fragments.   Lateral meniscus:  Intact with normal morphology.   LIGAMENTS   Cruciates: The anterior and posterior cruciate ligaments are intact.   Collaterals: The medial and lateral collateral ligament complexes are intact. There is medial buckling of the MCL related to the medial meniscal extrusion.   CARTILAGE   Patellofemoral: Mild patellofemoral degenerative changes with chondral thinning and minimal subchondral cyst formation  at the patellar apex.   Medial:  Moderate chondral thinning and surface irregularity.   Lateral:  Preserved.   MISCELLANEOUS   Joint:  Small joint effusion.   Popliteal Fossa: The popliteus muscle and tendon are intact. Tiny Baker's cyst.   Extensor Mechanism: The visualized quadriceps and patellar tendons are intact.   Bones: There is focal subchondral linear low signal peripherally in the medial tibial plateau with surrounding marrow edema. This could be reactive, although is suspicious for a small subchondral insufficiency fracture. No other significant osseous abnormalities.   Other: Trace fluid in the pes anserine bursa.   IMPRESSION: 1. Large root tear of the posterior horn of the medial meniscus with resulting moderate peripheral extrusion of the meniscus from the joint space. 2. Possible small subchondral insufficiency fracture in the medial tibial plateau. 3. Mild-to-moderate medial and mild patellofemoral compartment degenerative changes. 4. Intact lateral meniscus, cruciate and collateral ligaments.     Electronically Signed   By: Carey Bullocks M.D.   On: 06/15/2023 13:11 I, Clementeen Graham, personally (independently) visualized and performed the interpretation of the images attached in this note.    Assessment and Plan: 72 y.o. female with chronic bilateral knee pain left worsening more recently than right.  Patient has what looks to be a resolving bone contusion versus possible subchondral insufficiency fracture medial tibial plateau.  This probably was the majority source of her pain last month.  Fortunately she is improving.  She does have a meniscus tear in the left knee and on recent MRI right  knee as well.  These are potentially repairable as there are meniscus root tears.  She is doing okay so we will do some watchful waiting and continue quad strength.  Consider surgery referral if needed.  She is traveling to [facility] right before Christmas so she will  schedule with me the week before 20 December to consider intra-articular cortisone injections both knees.   PDMP not reviewed this encounter. No orders of the defined types were placed in this encounter.  No orders of the defined types were placed in this encounter.    Discussed warning signs or symptoms. Please see discharge instructions. Patient expresses understanding.   The above documentation has been reviewed and is accurate and complete Clementeen Graham, M.D.

## 2023-06-20 NOTE — Patient Instructions (Signed)
Thank you for coming in today.   Schedule with me the week before Friday Dec 20.  We could do cortisone injections in both knees then prior to your travel.   Let me know how this goes.

## 2023-08-17 ENCOUNTER — Ambulatory Visit
Admission: RE | Admit: 2023-08-17 | Discharge: 2023-08-17 | Disposition: A | Discharge status: Home / Self Care | Payer: Medicare Other | Source: Ambulatory Visit | Attending: Family Medicine | Admitting: Family Medicine

## 2023-08-17 DIAGNOSIS — Z1231 Encounter for screening mammogram for malignant neoplasm of breast: Secondary | ICD-10-CM

## 2023-08-23 ENCOUNTER — Ambulatory Visit: Payer: Medicare Other | Admitting: Family Medicine

## 2023-08-23 ENCOUNTER — Other Ambulatory Visit: Payer: Self-pay

## 2023-08-23 VITALS — BP 158/92 | HR 68 | Ht 61.0 in | Wt 241.0 lb

## 2023-08-23 DIAGNOSIS — M25562 Pain in left knee: Secondary | ICD-10-CM | POA: Diagnosis not present

## 2023-08-23 DIAGNOSIS — G8929 Other chronic pain: Secondary | ICD-10-CM

## 2023-08-23 DIAGNOSIS — M25561 Pain in right knee: Secondary | ICD-10-CM | POA: Diagnosis not present

## 2023-08-23 NOTE — Telephone Encounter (Signed)
Pt received KENALOG inj 08/23/23.  HOLD Zilretta until needed.

## 2023-08-23 NOTE — Progress Notes (Signed)
Rubin Payor, PhD, LAT, ATC acting as a scribe for Clementeen Graham, MD.  Linda Blackburn is a 72 y.o. female who presents to [facility] at [facility] today for cont'd bilat knee pain. Pt was last seen by Dr. Denyse Amass on 06/20/23 and was advised to plan for watchful waiting.  Today, pt reports both knees are feeling somewhat better. She canceled her trip to [facility]. When her knees hurt, seems to be mostly L-sided. She is trying to limit activity and walking gingerly. She has some family events that she would like to be feeling good for in the spring-summer, so she is wondering if the steroid injections could get her through.   Dx imaging: 06/03/23 L knee MRI             02/21/23 R & L knee XR  Pertinent review of systems: No fevers or chills  Relevant historical information: Hypertension   Exam:  BP (!) 158/92   Pulse 68   Ht 5\' 1"  (1.549 m)   Wt 241 lb (109.3 kg)   SpO2 97%   BMI 45.54 kg/m  General: Well Developed, well nourished, and in no acute distress.   MSK: Knees bilaterally normal-appearing normal motion with crepitation.  Tender palpation medial joint line.    Lab and Radiology Results  Procedure: Real-time Ultrasound Guided Injection of right knee joint superior lateral patella space Device: Philips Affiniti 50G/GE Logiq Images permanently stored and available for review in PACS Verbal informed consent obtained.  Discussed risks and benefits of procedure. Warned about infection, bleeding, hyperglycemia damage to structures among others. Patient expresses understanding and agreement Time-out conducted.   Noted no overlying erythema, induration, or other signs of local infection.   Skin prepped in a sterile fashion.   Local anesthesia: Topical Ethyl chloride.   With sterile technique and under real time ultrasound guidance: 40 mg of Kenalog and 2 mL of Marcaine injected into knee joint. Fluid seen entering the joint capsule.   Completed  without difficulty   Pain immediately resolved suggesting accurate placement of the medication.   Advised to call if fevers/chills, erythema, induration, drainage, or persistent bleeding.   Images permanently stored and available for review in the ultrasound unit.  Impression: Technically successful ultrasound guided injection.   Procedure: Real-time Ultrasound Guided Injection of left knee joint superior lateral patella space Device: Philips Affiniti 50G/GE Logiq Images permanently stored and available for review in PACS Verbal informed consent obtained.  Discussed risks and benefits of procedure. Warned about infection, bleeding, hyperglycemia damage to structures among others. Patient expresses understanding and agreement Time-out conducted.   Noted no overlying erythema, induration, or other signs of local infection.   Skin prepped in a sterile fashion.   Local anesthesia: Topical Ethyl chloride.   With sterile technique and under real time ultrasound guidance: 40 mg of Kenalog and 2 mL of Marcaine injected into knee joint. Fluid seen entering the joint capsule.   Completed without difficulty   Pain immediately resolved suggesting accurate placement of the medication.   Advised to call if fevers/chills, erythema, induration, drainage, or persistent bleeding.   Images permanently stored and available for review in the ultrasound unit.  Impression: Technically successful ultrasound guided injection.        Assessment and Plan: 72 y.o. female with lateral knee pain due to exacerbation of DJD.  Plan of steroid injection today.  Continue quad strengthening exercises and work on weight loss.  Check back as needed. Chronic problem  acute exacerbation.  PDMP not reviewed this encounter. Orders Placed This Encounter  Procedures   [place] LIMITED JOINT SPACE STRUCTURES LOW BILAT(NO LINKED CHARGES)    Reason for Exam (SYMPTOM  OR DIAGNOSIS REQUIRED):   bilateral knee pain    Preferred  imaging location?:   [facility]   No orders of the defined types were placed in this encounter.    Discussed warning signs or symptoms. Please see discharge instructions. Patient expresses understanding.   The above documentation has been reviewed and is accurate and complete Clementeen Graham, M.D.

## 2023-08-23 NOTE — Patient Instructions (Addendum)
Thank you for coming in today.   You received an injection today. Seek immediate medical attention if the joint becomes red, extremely painful, or is oozing fluid.   Recheck as needed.

## 2023-08-24 ENCOUNTER — Encounter: Payer: Self-pay | Admitting: Family Medicine

## 2023-08-24 ENCOUNTER — Ambulatory Visit (INDEPENDENT_AMBULATORY_CARE_PROVIDER_SITE_OTHER): Payer: Medicare Other | Admitting: Family Medicine

## 2023-08-24 DIAGNOSIS — E785 Hyperlipidemia, unspecified: Secondary | ICD-10-CM | POA: Diagnosis not present

## 2023-08-24 DIAGNOSIS — R739 Hyperglycemia, unspecified: Secondary | ICD-10-CM

## 2023-08-24 DIAGNOSIS — I10 Essential (primary) hypertension: Secondary | ICD-10-CM

## 2023-08-24 DIAGNOSIS — R5383 Other fatigue: Secondary | ICD-10-CM

## 2023-08-24 LAB — CBC WITH DIFFERENTIAL/PLATELET
Basophils Absolute: 0 10*3/uL (ref 0.0–0.1)
Basophils Relative: 0.1 % (ref 0.0–3.0)
Eosinophils Absolute: 0 10*3/uL (ref 0.0–0.7)
Eosinophils Relative: 0 % (ref 0.0–5.0)
HCT: 43.8 % (ref 36.0–46.0)
Hemoglobin: 14.3 g/dL (ref 12.0–15.0)
Lymphocytes Relative: 11.1 % — ABNORMAL LOW (ref 12.0–46.0)
Lymphs Abs: 1.4 10*3/uL (ref 0.7–4.0)
MCHC: 32.6 g/dL (ref 30.0–36.0)
MCV: 89.5 fL (ref 78.0–100.0)
Monocytes Absolute: 0.6 10*3/uL (ref 0.1–1.0)
Monocytes Relative: 4.4 % (ref 3.0–12.0)
Neutro Abs: 10.9 10*3/uL — ABNORMAL HIGH (ref 1.4–7.7)
Neutrophils Relative %: 84.4 % — ABNORMAL HIGH (ref 43.0–77.0)
Platelets: 250 10*3/uL (ref 150.0–400.0)
RBC: 4.9 Mil/uL (ref 3.87–5.11)
RDW: 13.7 % (ref 11.5–15.5)
WBC: 12.9 10*3/uL — ABNORMAL HIGH (ref 4.0–10.5)

## 2023-08-24 LAB — COMPREHENSIVE METABOLIC PANEL
ALT: 22 U/L (ref 0–35)
AST: 22 U/L (ref 0–37)
Albumin: 4.7 g/dL (ref 3.5–5.2)
Alkaline Phosphatase: 95 U/L (ref 39–117)
BUN: 21 mg/dL (ref 6–23)
CO2: 21 meq/L (ref 19–32)
Calcium: 9.5 mg/dL (ref 8.4–10.5)
Chloride: 107 meq/L (ref 96–112)
Creatinine, Ser: 0.72 mg/dL (ref 0.40–1.20)
GFR: 82.24 mL/min (ref >60.00)
Glucose, Bld: 128 mg/dL — ABNORMAL HIGH (ref 70–99)
Potassium: 3.9 meq/L (ref 3.5–5.1)
Sodium: 138 meq/L (ref 135–145)
Total Bilirubin: 0.5 mg/dL (ref 0.2–1.2)
Total Protein: 7.4 g/dL (ref 6.0–8.3)

## 2023-08-24 LAB — LIPID PANEL
Cholesterol: 190 mg/dL (ref 0–200)
HDL: 54.4 mg/dL (ref >39.00)
LDL Cholesterol: 118 mg/dL — ABNORMAL HIGH (ref 0–99)
NonHDL: 135.21
Total CHOL/HDL Ratio: 3
Triglycerides: 87 mg/dL (ref 0.0–149.0)
VLDL: 17.4 mg/dL (ref 0.0–40.0)

## 2023-08-24 LAB — TSH: TSH: 0.53 u[IU]/mL (ref 0.35–5.50)

## 2023-08-24 LAB — HEMOGLOBIN A1C: Hgb A1c MFr Bld: 5.2 % (ref 4.6–6.5)

## 2023-08-24 MED ORDER — LOSARTAN POTASSIUM 100 MG PO TABS: 100.0000 mg | ORAL_TABLET | Freq: Every day | ORAL | 3 refills | Status: DC

## 2023-08-24 MED ORDER — HYDROCODONE-ACETAMINOPHEN 5-325 MG PO TABS: 1.0000 | ORAL_TABLET | Freq: Four times a day (QID) | ORAL | 0 refills | Status: DC | PRN

## 2023-08-24 MED ORDER — SERTRALINE HCL 100 MG PO TABS: ORAL_TABLET | ORAL | 3 refills | Status: DC

## 2023-08-24 MED ORDER — PANTOPRAZOLE SODIUM 40 MG PO TBEC: 40.0000 mg | DELAYED_RELEASE_TABLET | Freq: Every day | ORAL | 3 refills | Status: DC

## 2023-08-24 MED ORDER — ZOLPIDEM TARTRATE 5 MG PO TABS: 5.0000 mg | ORAL_TABLET | Freq: Every evening | ORAL | 0 refills | Status: DC | PRN

## 2023-08-24 MED ORDER — ONDANSETRON 4 MG PO TBDP: ORAL_TABLET | ORAL | 1 refills | Status: DC

## 2023-08-24 NOTE — Progress Notes (Signed)
Established Patient Office Visit  Subjective   Patient ID: Linda Blackburn, female    DOB: 1950-10-01  Age: 72 y.o. MRN: [id number]  No chief complaint on file.   HPI   Linda Blackburn was initially scheduled for a "physical exam.  However, she has primary insurance coverage of Medicare and already had Medicare wellness visit within the past year.  We shifted the focus on her multiple chronic medical problems.  She has history of morbid obesity with gradual weight gain over past several years.  She has osteoarthritis involving both knees and had cortisone injection just yesterday.  Other medical problems include history of recurrent pancreatitis, GERD, hypertension, recurrent depression.  She would like to consider medically supervised weight loss clinic.  She feels like she does not really eat that often or that much but is struggled with weight loss in recent years.  Has not tried any recent specific plans.  Does realize that she frequently eats the wrong types of foods.  Remains on losartan 100 mg daily for hypertension.  Blood pressure was up at sports medicine yesterday and again today but atypical for her.  She is usually been very well-controlled.  She has longstanding history of pancreatitis with multiple prior admissions.  Had extensive workup both at [facility] and at a specialty clinic in [place].  She tries to keep at least a few hydrocodone on hand.  Her last flareup she was able to manage at home without admission.  Also requesting refill of Zofran.  She has long history of insomnia.  Infrequently takes Ambien and requesting refill  He has history of mild hyperlipidemia and no recent labs.  She would like to get her blood sugar assessed as well.  Denies any strong family history of diabetes.  Past Medical History:  Diagnosis Date   Abnormal uterine bleeding    Amenorrhea    Anxiety    Depression    Gallstones    Hay fever    HTN (hypertension)    Pancreatitis    Urine  incontinence    UTI (urinary tract infection)    reoccuring    Past Surgical History:  Procedure Laterality Date   ABDOMINAL HYSTERECTOMY  1984   fibroids   APPENDECTOMY  1984   BILIARY DILATION  05/29/2019   Procedure: BILIARY DILATION;  Surgeon: Lemar Lofty., MD;  Location: [facility];  Service: Gastroenterology;;   BIOPSY  05/29/2019   Procedure: BIOPSY;  Surgeon: Lemar Lofty., MD;  Location: [facility];  Service: Gastroenterology;;   CATARACT EXTRACTION Right 11/22/2021   CHOLECYSTECTOMY  09/08/1999   ERCP N/A 05/29/2019   Procedure: ENDOSCOPIC RETROGRADE CHOLANGIOPANCREATOGRAPHY (ERCP);  Surgeon: Lemar Lofty., MD;  Location: [facility];  Service: Gastroenterology;  Laterality: N/A;   ERCP W/ SPHINCTEROTOMY AND BALLOON DILATION  08/2008   ESOPHAGOGASTRODUODENOSCOPY (EGD) WITH PROPOFOL N/A 05/29/2019   Procedure: ESOPHAGOGASTRODUODENOSCOPY (EGD) WITH PROPOFOL;  Surgeon: Meridee Score Netty Starring., MD;  Location: [facility];  Service: Gastroenterology;  Laterality: N/A;   OOPHORECTOMY     Only 1 was taken   POLYPECTOMY  05/29/2019   Procedure: POLYPECTOMY;  Surgeon: Mansouraty, Netty Starring., MD;  Location: [facility];  Service: Gastroenterology;;   REMOVAL OF STONES  05/29/2019   Procedure: REMOVAL OF STONES;  Surgeon: Lemar Lofty., MD;  Location: [facility];  Service: Gastroenterology;;   TONSILLECTOMY     TUBAL LIGATION      reports that she has never smoked. She has never used smokeless tobacco. She reports  that she does not currently use alcohol. She reports that she does not use drugs. family history includes Alcohol abuse in her mother; Cerebral palsy in her granddaughter; Hiatal hernia in her maternal aunt; Prostate cancer in her father and paternal grandfather; Stroke in her maternal aunt. Allergies  Allergen Reactions   Other Hives and Swelling    AQUASONIC [place] GEL Patient did not have a reaction in 2024 when this was  used inadvertently to image the knee.   Buprenorphine Hcl    Codeine     GI upset   Dilaudid [Hydromorphone Hcl] Itching   Morphine And Codeine     Ants crawling on skin   Diclofenac Sodium Rash    Per pt rash behind knee     Review of Systems  Constitutional:  Negative for chills, fever and malaise/fatigue.  Eyes:  Negative for blurred vision.  Respiratory:  Negative for shortness of breath.   Cardiovascular:  Negative for chest pain.  Gastrointestinal:  Negative for abdominal pain.  Neurological:  Negative for dizziness, weakness and headaches.      Objective:     BP (!) 150/70   Pulse 86   Temp 98 F (36.7 C) (Oral)   Ht 5' 1.42" (1.56 m)   Wt 244 lb 4.8 oz (110.8 kg)   SpO2 98%   BMI 45.54 kg/m  BP Readings from Last 3 Encounters:  08/24/23 (!) 150/70  08/23/23 (!) 158/92  06/20/23 130/80   Wt Readings from Last 3 Encounters:  08/24/23 244 lb 4.8 oz (110.8 kg)  08/23/23 241 lb (109.3 kg)  06/20/23 240 lb (108.9 kg)      Physical Exam Vitals reviewed.  Constitutional:      General: She is not in acute distress.    Appearance: She is well-developed.  HENT:     Right Ear: Tympanic membrane normal.     Left Ear: Tympanic membrane normal.  Eyes:     Pupils: Pupils are equal, round, and reactive to light.  Neck:     Thyroid: No thyromegaly.     Vascular: No JVD.  Cardiovascular:     Rate and Rhythm: Normal rate and regular rhythm.     Heart sounds:     No gallop.  Pulmonary:     Effort: Pulmonary effort is normal. No respiratory distress.     Breath sounds: Normal breath sounds. No wheezing or rales.  Musculoskeletal:     Cervical back: Neck supple.  Neurological:     Mental Status: She is alert.      No results found for any visits on 08/24/23.    The ASCVD Risk score (Arnett DK, et al., 2019) failed to calculate for the following reasons:   Cannot find a previous HDL lab   Cannot find a previous total cholesterol lab    Assessment &  Plan:   #1 morbid obesity with BMI over 44.  We discussed multiple potential associated co-morbidities.  She does have osteoarthritis especially involving the knees and also hypertension.  We discussed possible referral to [facility] bariatric medical weight loss clinic and she would like to move in that direction.  She feels like it be helpful to have regular follow-up and accountability along with some good sound medical advice regarding weight loss strategies.  Referral placed.  #2 hypertension.  Generally well-controlled on losartan 100 mg daily.  Up today.  We decided on close monitoring next couple weeks.  Reassess here in 2 weeks time.  Handout on DASH diet  given.  Watch sodium intake.  Consider additional medication, possibly low-dose calcium channel blocker, at follow-up if not improved  #3 history of chronic intermittent insomnia.  Infrequently takes Ambien for severe insomnia.  We wrote for Ambien 5 mg 1 nightly as needed #10  #4 history of pancreas divisum.  No recent flare.  Patient requesting just a few hydrocodone in case she has a flareup during the night or on the weekend which we think is reasonable.  She has no history of issues.  Wrote for hydrocodone 5 mg take 1-2 at onset of severe pain #12  #5 history of mild hyperlipidemia.  Recheck fasting lipids today  #6 history of a few sporadic mild elevated glucose levels in the past.  Recheck A1c today.   Return in about 2 weeks (around 09/07/2023).    [name], MD

## 2023-08-24 NOTE — Patient Instructions (Signed)
Set up 2 week followup

## 2023-09-07 ENCOUNTER — Ambulatory Visit (INDEPENDENT_AMBULATORY_CARE_PROVIDER_SITE_OTHER): Payer: Medicare Other | Admitting: Family Medicine

## 2023-09-07 ENCOUNTER — Encounter: Payer: Self-pay | Admitting: Family Medicine

## 2023-09-07 VITALS — BP 142/78 | HR 73 | Temp 97.9°F | Ht 61.23 in | Wt 245.3 lb

## 2023-09-07 DIAGNOSIS — D72829 Elevated white blood cell count, unspecified: Secondary | ICD-10-CM | POA: Diagnosis not present

## 2023-09-07 DIAGNOSIS — I10 Essential (primary) hypertension: Secondary | ICD-10-CM

## 2023-09-07 DIAGNOSIS — R739 Hyperglycemia, unspecified: Secondary | ICD-10-CM | POA: Diagnosis not present

## 2023-09-07 NOTE — Progress Notes (Signed)
 Established Patient Office Visit  Subjective   Patient ID: Linda Blackburn, female    DOB: [birth year]  Age: 73 y.o. MRN: [id number]  Chief Complaint  Patient presents with   Medical Management of Chronic Issues    Bp     HPI   Linda Blackburn is here for follow-up regarding blood pressure.  She was seen recently and had blood pressure reading 150/70.  She takes losartan  100 mg daily.  She had several abnormal labs and wished to discuss those as well.  Her glucose was 128 with A1c 5.2%.  However, she recently had steroid injections in both knees.  She also had mildly elevated white count of 12,000 and we explained this was very likely related to steroid as well.  She has no family history of diabetes.  She has gained considerable weight in recent years.  We recommend referral to weight management center recently and she is still waiting to hear back regarding appointment.  She does have a history of some stress eating and states that high glycemic sweets have been her comfort food in the past.  Past Medical History:  Diagnosis Date   Abnormal uterine bleeding    Amenorrhea    Anxiety    Depression    Gallstones    Hay fever    HTN (hypertension)    Pancreatitis    Urine incontinence    UTI (urinary tract infection)    reoccuring    Past Surgical History:  Procedure Laterality Date   ABDOMINAL HYSTERECTOMY  1984   fibroids   APPENDECTOMY  1984   BILIARY DILATION  05/29/2019   Procedure: BILIARY DILATION;  Surgeon: Wilhelmenia Linda Raddle., MD;  Location: [facility];  Service: Gastroenterology;;   BIOPSY  05/29/2019   Procedure: BIOPSY;  Surgeon: Wilhelmenia Linda Raddle., MD;  Location: [facility];  Service: Gastroenterology;;   CATARACT EXTRACTION Right 11/22/2021   CHOLECYSTECTOMY  09/08/1999   ERCP N/A 05/29/2019   Procedure: ENDOSCOPIC RETROGRADE CHOLANGIOPANCREATOGRAPHY (ERCP);  Surgeon: Wilhelmenia Linda Raddle., MD;  Location: [facility];  Service: Gastroenterology;   Laterality: N/A;   ERCP W/ SPHINCTEROTOMY AND BALLOON DILATION  08/2008   ESOPHAGOGASTRODUODENOSCOPY (EGD) WITH PROPOFOL  N/A 05/29/2019   Procedure: ESOPHAGOGASTRODUODENOSCOPY (EGD) WITH PROPOFOL ;  Surgeon: Wilhelmenia Linda Raddle., MD;  Location: [facility];  Service: Gastroenterology;  Laterality: N/A;   OOPHORECTOMY     Only 1 was taken   POLYPECTOMY  05/29/2019   Procedure: POLYPECTOMY;  Surgeon: Linda Blackburn, Linda Raddle., MD;  Location: [facility];  Service: Gastroenterology;;   REMOVAL OF STONES  05/29/2019   Procedure: REMOVAL OF STONES;  Surgeon: Wilhelmenia Linda Raddle., MD;  Location: [facility];  Service: Gastroenterology;;   TONSILLECTOMY     TUBAL LIGATION      reports that she has never smoked. She has never used smokeless tobacco. She reports that she does not currently use alcohol. She reports that she does not use drugs. family history includes Alcohol abuse in her mother; Cerebral palsy in her granddaughter; Hiatal hernia in her maternal aunt; Prostate cancer in her father and paternal grandfather; Stroke in her maternal aunt. Allergies  Allergen Reactions   Other Hives and Swelling    AQUASONIC US  GEL Patient did not have a reaction in 2024 when this was used inadvertently to image the knee.   Buprenorphine Hcl    Codeine     GI upset   Dilaudid  [Hydromorphone  Hcl] Itching   Morphine  And Codeine     Ants crawling on skin  Diclofenac  Sodium Rash    Per pt rash behind knee     Review of Systems  Constitutional:  Negative for malaise/fatigue.  Eyes:  Negative for blurred vision.  Respiratory:  Negative for shortness of breath.   Cardiovascular:  Negative for chest pain.  Neurological:  Negative for dizziness, weakness and headaches.      Objective:     BP (!) 142/78 (BP Location: Left Arm, Cuff Size: Large)   Pulse 73   Temp 97.9 F (36.6 C) (Oral)   Ht 5' 1.23 (1.555 m)   Wt 245 lb 4.8 oz (111.3 kg)   SpO2 95%   BMI 46.00 kg/m  BP Readings from  Last 3 Encounters:  09/07/23 (!) 142/78  08/24/23 (!) 150/70  08/23/23 (!) 158/92   Wt Readings from Last 3 Encounters:  09/07/23 245 lb 4.8 oz (111.3 kg)  08/24/23 244 lb 4.8 oz (110.8 kg)  08/23/23 241 lb (109.3 kg)      Physical Exam Constitutional:      Appearance: She is well-developed.  Eyes:     Pupils: Pupils are equal, round, and reactive to light.  Neck:     Thyroid : No thyromegaly.     Vascular: No JVD.  Cardiovascular:     Rate and Rhythm: Normal rate and regular rhythm.     Heart sounds:     No gallop.  Pulmonary:     Effort: Pulmonary effort is normal. No respiratory distress.     Breath sounds: Normal breath sounds. No wheezing or rales.  Musculoskeletal:     Cervical back: Neck supple.  Neurological:     Mental Status: She is alert.      No results found for any visits on 09/07/23.    The 10-year ASCVD risk score (Arnett DK, et al., 2019) is: 22.8%    Assessment & Plan:   #1 hypertension.  Suboptimally controlled.  Repeat blood pressure today left arm seated large cuff 142/78.  We discussed options.  Could add additional medication but she would like to try a 8-month trial of weight loss.  Referral has been placed to weight management center.  Handout on low-sodium diet given.  We explained that modest weight loss of even 3 to 5% can reduce blood pressure substantially.  Set up 16-month follow-up.  If not further to goal at that point consider addition of low-dose calcium  channel blocker such as amlodipine  #2 recent mild hyperglycemia but with normal A1c of 5.2%.  Suspect related to recent steroid injections.  Reassurance given.  #3 mild leukocytosis with white count 12,000.  No fever or other indicators of infection.  Reassurance this is very likely related to recent steroids  Wolm Scarlet, MD

## 2023-09-07 NOTE — Patient Instructions (Signed)
 Try to lose some weight, as discussed  Let me know if you have not heard back in one week about the referral to Weight Loss clinic.

## 2023-09-21 ENCOUNTER — Telehealth: Payer: Self-pay

## 2023-09-21 ENCOUNTER — Other Ambulatory Visit: Payer: Self-pay | Admitting: Family Medicine

## 2023-09-21 NOTE — Telephone Encounter (Signed)
Copied from CRM [id number]. Topic: General - Other >> Sep 21, 2023 12:43 PM Denese Killings wrote: Reason for CRM: Patient wants to let Dr. Caryl Never know that the [facility] still haven't contacted her and its been 2 weeks.

## 2023-09-21 NOTE — Telephone Encounter (Signed)
Copied from CRM [id number]. Topic: Clinical - Medication Refill >> Sep 21, 2023 12:45 PM Denese Killings wrote: Most Recent Primary Care Visit:  Provider: [name]  Department: [facility]  Visit Type: OFFICE VISIT  Date: 09/07/2023  Medication: ***  Has the patient contacted their pharmacy?  (Agent: If no, request that the patient contact the pharmacy for the refill. If patient does not wish to contact the pharmacy document the reason why and proceed with request.) (Agent: If yes, when and what did the pharmacy advise?)  Is this the correct pharmacy for this prescription?  If no, delete pharmacy and type the correct one.  This is the patient's preferred pharmacy:  [facility] - [place], [place] - [address] [place] [address] Phone: [phone] Fax: [phone]  [id number] [place], [place] - [address] AT [facility] [address] [facility] [address] & [facility] [address] [place] [address] Phone: [phone] Fax: [phone]   Has the prescription been filled recently?   Is the patient out of the medication?   Has the patient been seen for an appointment in the last year OR does the patient have an upcoming appointment?   Can we respond through MyChart?   Agent: Please be advised that Rx refills may take up to 3 business days. We ask that you follow-up with your pharmacy.

## 2023-09-24 MED ORDER — FLUCONAZOLE 100 MG PO TABS: 100.0000 mg | ORAL_TABLET | Freq: Every day | ORAL | 0 refills | Status: DC | PRN

## 2023-09-24 NOTE — Telephone Encounter (Signed)
Rx done. 

## 2023-09-28 NOTE — Telephone Encounter (Signed)
Which wellness center?

## 2023-10-04 NOTE — Telephone Encounter (Signed)
[facility] has called patient once and will be waiting for patient to return their call to schedule.

## 2023-10-22 NOTE — Telephone Encounter (Signed)
 Forwarding to [place] to re-verify benefits

## 2023-11-05 ENCOUNTER — Ambulatory Visit (INDEPENDENT_AMBULATORY_CARE_PROVIDER_SITE_OTHER): Payer: Medicare Other | Admitting: Family Medicine

## 2023-11-05 ENCOUNTER — Encounter (INDEPENDENT_AMBULATORY_CARE_PROVIDER_SITE_OTHER): Payer: Self-pay | Admitting: Family Medicine

## 2023-11-05 VITALS — BP 167/83 | HR 84 | Temp 97.9°F | Ht 61.0 in | Wt 238.0 lb

## 2023-11-05 DIAGNOSIS — Z6841 Body Mass Index (BMI) 40.0 and over, adult: Secondary | ICD-10-CM

## 2023-11-05 DIAGNOSIS — K861 Other chronic pancreatitis: Secondary | ICD-10-CM

## 2023-11-05 DIAGNOSIS — Z0289 Encounter for other administrative examinations: Secondary | ICD-10-CM

## 2023-11-05 DIAGNOSIS — I10 Essential (primary) hypertension: Secondary | ICD-10-CM

## 2023-11-05 NOTE — Progress Notes (Signed)
 Linda Grippe, DO, ABFM, ABOM Bariatric physician [address] [place], [place], [place] [address] Office: [phone]  /  Fax: [phone]     Initial Evaluation:  Linda Blackburn was seen in clinic today to evaluate for obesity. Linda Blackburn is interested in losing weight to improve overall health and reduce the risk of weight related complications. Linda Blackburn presents today to review program treatment options, initial physical assessment, and evaluation.      Linda Blackburn was referred by: PCP  When asked what you hope to accomplish? Linda Blackburn states: Improve medical condition, Improve quality of life, Reduce risk of surgery   When asked how has your weight affected you? Linda Blackburn states: Contributed to medical problems, Contributed to orthopedic problems or mobility issues, Having fatigue, and Having poor endurance Hx of pancreatitis   Contributing factors to her weight change:  Life Event (husband passing) ,Nutritional, stress, brain cancer (husband), reduced activy   Some associated conditions: Hypertension, Arthritis:Bilateral knee pain, and GERD  Current nutrition plan: None  Current level of physical activity: None  Current or previous pharmacotherapy:  Took Orlistat about 30 years ago.   Response to medication: Pt was only on it for a couple of months since it was ineffective so Linda Blackburn stopped it.    Barriers to weight loss that patient expresses a concern about today: low volume of physical activity at present , orthopedic problems, medical conditions or chronic pain affecting mobility, and moderate to high levels of stress.    Past Medical History:  Diagnosis Date   Abnormal uterine bleeding    Amenorrhea    Anxiety    Depression    Gallstones    Hay fever    HTN (hypertension)    Pancreatitis    Urine incontinence    UTI (urinary tract infection)    reoccuring     Current Outpatient Medications  Medication Instructions   fluconazole (DIFLUCAN) 100 mg, Oral, Daily PRN    HYDROcodone-acetaminophen (NORCO/VICODIN) 5-325 MG tablet 1-2 tablets, Oral, Every 6 hours PRN   losartan (COZAAR) 100 mg, Oral, Daily   ondansetron (ZOFRAN-ODT) 4 MG disintegrating tablet Take 2 tablets every 8 hours as needed for nausea/vomiting   pantoprazole (PROTONIX) 40 mg, Oral, Daily   sertraline (ZOLOFT) 100 MG tablet TAKE ONE TABLET BY MOUTH EVERY DAY   Vibegron 75 MG TABS 1 tablet, Daily   vitamin B-12 (CYANOCOBALAMIN) 100 mcg, Daily   zolpidem (AMBIEN) 5 mg, Oral, At bedtime PRN     Allergies  Allergen Reactions   Other Hives and Swelling    AQUASONIC [place] GEL Patient did not have a reaction in 2024 when this was used inadvertently to image the knee.   Buprenorphine Hcl    Codeine     GI upset   Dilaudid [Hydromorphone Hcl] Itching   Morphine And Codeine     Ants crawling on skin   Diclofenac Sodium Rash    Per pt rash behind knee      Past Surgical History:  Procedure Laterality Date   ABDOMINAL HYSTERECTOMY  1984   fibroids   APPENDECTOMY  1984   BILIARY DILATION  05/29/2019   Procedure: BILIARY DILATION;  Surgeon: Lemar Lofty., MD;  Location: [facility];  Service: Gastroenterology;;   BIOPSY  05/29/2019   Procedure: BIOPSY;  Surgeon: Lemar Lofty., MD;  Location: [facility];  Service: Gastroenterology;;   CATARACT EXTRACTION Right 11/22/2021   CHOLECYSTECTOMY  09/08/1999   ERCP N/A 05/29/2019   Procedure: ENDOSCOPIC RETROGRADE CHOLANGIOPANCREATOGRAPHY (ERCP);  Surgeon:  Mansouraty, Netty Starring., MD;  Location: [facility];  Service: Gastroenterology;  Laterality: N/A;   ERCP W/ SPHINCTEROTOMY AND BALLOON DILATION  08/2008   ESOPHAGOGASTRODUODENOSCOPY (EGD) WITH PROPOFOL N/A 05/29/2019   Procedure: ESOPHAGOGASTRODUODENOSCOPY (EGD) WITH PROPOFOL;  Surgeon: Meridee Score Netty Starring., MD;  Location: [facility];  Service: Gastroenterology;  Laterality: N/A;   OOPHORECTOMY     Only 1 was taken   POLYPECTOMY  05/29/2019   Procedure:  POLYPECTOMY;  Surgeon: Mansouraty, Netty Starring., MD;  Location: [facility];  Service: Gastroenterology;;   REMOVAL OF STONES  05/29/2019   Procedure: REMOVAL OF STONES;  Surgeon: Lemar Lofty., MD;  Location: [facility];  Service: Gastroenterology;;   TONSILLECTOMY     TUBAL LIGATION       Family History  Problem Relation Age of Onset   Alcohol abuse Mother    Prostate cancer Father    Stroke Maternal Aunt    Hiatal hernia Maternal Aunt    Prostate cancer Paternal Grandfather    Cerebral palsy Granddaughter    Breast cancer Neg Hx    BRCA 1/2 Neg Hx      Objective:  BP (!) 167/83   Pulse 84   Temp 97.9 F (36.6 C)   Ht 5\' 1"  (1.549 m)   Wt 238 lb (108 kg)   SpO2 99%   BMI 44.97 kg/m  Linda Blackburn was weighed on the bioimpedance scale: Body mass index is 44.97 kg/m.  Visceral Fat %: 21 , Body Fat %:   53.8%   Vitals Temp: 97.9 F (36.6 C) BP: (!) 167/83 Pulse Rate: 84 SpO2: 99 %   Anthropometric Measurements Height: 5\' 1"  (1.549 m) Weight: 238 lb (108 kg) BMI (Calculated): 44.99 Weight at Last Visit: N/A Weight Lost Since Last Visit: N/A Weight Gained Since Last Visit: N/A Starting Weight: N/A   Body Composition  Body Fat %: 53.8 % Fat Mass (lbs): 128.4 lbs Muscle Mass (lbs): 104.8 lbs Total Body Water (lbs): 79.8 lbs Visceral Fat Rating : 21   Other Clinical Data Fasting: No Labs: No Today's Visit #: Info Session Comments: Information Session     General: Well Developed, well nourished, and in no acute distress.  HEENT: Normocephalic, atraumatic; EOMI, sclerae are anicteric. Skin: Warm and dry, good turgor Chest:  Normal excursion, shape, no gross ABN Respiratory: No conversational dyspnea; speaking in full sentences NeuroM-Sk:  Normal gross ROM * 4 extremities  Psych: A and O *3, insight adequate, mood- full    Assessment and Plan:   FOR THE DISEASE OF OBESITY: BMI 40.0-44.9, adult (HCC) Morbid obesity (HCC) Assessment & Plan: We  reviewed anthropometrics, biometrics, associated medical conditions and contributing factors with patient. Linda Blackburn would benefit from a medically tailored reduced calorie nutrional plan based on his REE (resting energy expenditure), which will be determined by indirect calorimetry.  We will also assess for cardiometabolic risk and nutritional derangements via fasting labs at intake appointment.    Obesity Treatment / Action Plan:   Linda Blackburn was weighed on the bioimpedance scale and results were discussed and documented in the synopsis.   Linda Blackburn will complete provided nutritional and psychosocial assessment questionnaire before the next appointment.  Linda Blackburn will be scheduled for indirect calorimetry to determine resting energy expenditure in a fasting state.  This will allow [place] to create a reduced calorie, high-protein meal plan to promote loss of fat mass while preserving muscle mass.  We will also assess for cardiometabolic risk and nutritional derangements via an ECG and fasting  serologies at her next appointment.  Linda Blackburn was encouraged to work on amassing support from family and friends to begin their weight loss journey.   Work on eliminating or reducing the presence of highly processed, poorly nutritious, calorie-dense foods in the home.   Obesity Education Performed Today:  Patient was counseled on nutritional approaches to weight loss and benefits of reducing processed foods and consuming plant-based foods and high quality protein as part of nutritional weight management program.   We discussed the importance of long term lifestyle changes which include nutrition, exercise and behavioral modifications as well as the importance of customizing this to her specific health and social needs.   We discussed the benefits of reaching a healthier weight to alleviate the symptoms of existing conditions and reduce the risks of the biomechanical, metabolic and psychological effects of  obesity.  Was counseled on the health benefits of losing 5%-10% of total body weight.  Was counseled on our cognitive behavorial therapy program, lead by our bariatric psychologist, who focuses on emotional eating and creating positive behavorial change.  Was counseled on bariatric pharmacotherapy and how this may be used as an adjunct in their weight management    Linda Blackburn appears to be in the action stage of change and states they are ready to start intensive lifestyle modifications and behavioral modifications.  It was recommended that Linda Blackburn follow up in the next 1-2 weeks to review the above steps, and to continue with treatment of their chronic disease state of obesity   FOR OTHER CONDITIONS RELATED TO THE DISEASE OF OBESITY: Primary hypertension Assessment & Plan: BP Readings from Last 3 Encounters:  11/05/23 (!) 167/83  09/07/23 (!) 142/78  08/24/23 (!) 150/70   Dx over 10 years ago. Pt is currently on Cozaar 100 mg once daily. Tolerating well with no adverse side effects. Linda Blackburn reports her BP tends to average in the 140s/70s.   Continue with current antihypertensive treatment as prescribed. Discussed how following a heart healthy diet and regular exercise can help improve condition. Will monitor condition alongside PCP should pt join the program.    Idiopathic chronic pancreatitis (HCC) Assessment & Plan: Pt reports a hx of idiopathic chronic pancreatitis. Has been seen by pancreatic specialists at [facility] about 10-15 years ago. Linda Blackburn tried to have surgery to better help with drainage, which was unsuccessful. Linda Blackburn has episodes of associated abdominal pain for which Linda Blackburn takes 1-2 tablets of Norco as needed. Linda Blackburn notes that Linda Blackburn has severe episodes that result in her hospitalization. Linda Blackburn notes it has been a year since her last   Pt recommended to follow up as instructed by her PCP/specialists and continue with her current regimen as recommended by them. Should pt decide to join the program, we  will monitor her condition alongside these individuals as it relates to her weight loss journey.    Attestations:   Reviewed by clinician on day of visit: allergies, medications, problem list, medical history, surgical history, family history, social history, and previous encounter notes pertinent to obesity diagnosis.  50 minutes was spent today on this visit including the above counseling, pre-visit chart review, and post-visit documentation.  Over 50% of this time was spent in direct, face-to-face counseling and coordination of care  I, Isabelle Course, acting as a medical scribe for Thomasene Lot, DO., have compiled all relevant documentation for today's office visit on behalf of Thomasene Lot, DO, while in the presence of Linda & McLennan, DO.  I have reviewed the above documentation for accuracy and  completeness, and I agree with the above. Linda Blackburn, D.O.  The 21st Century Cures Act was signed into law in 2016 which includes the topic of electronic health records.  This provides immediate access to information in MyChart.  This includes consultation notes, operative notes, office notes, lab results and pathology reports.  If you have any questions about what you read please let us know at your next visit so we can discuss your concerns and take corrective action if need be.  We are right here with you!

## 2023-11-07 ENCOUNTER — Telehealth: Payer: Self-pay | Admitting: Family Medicine

## 2023-11-07 NOTE — Telephone Encounter (Signed)
 Patient called stating that she would like to have gel injections in both knees again. She was previously given Gelsyn back in September.

## 2023-11-27 NOTE — Telephone Encounter (Signed)
 Ran patient for bilateral gelsyn. Waiting insurance reply

## 2023-11-28 ENCOUNTER — Telehealth: Payer: Self-pay

## 2023-11-28 NOTE — Telephone Encounter (Signed)
 Patient needs an appointment once the medication is stocked.   Orthovisc for bilateral knees approved. Deductible applies. Since deductible has been met, patient is responsible for a coinsurance. Prior authorization is nor required. Secondary: Due to third party restrictions with [facility], we are unable to obtain coverage information. Please contact the plan at [phone] directly to confirm coverage.   Case ID: [id number] Exp: 05/30/2024

## 2023-12-05 NOTE — Telephone Encounter (Signed)
 Scheduled 4/8

## 2023-12-06 ENCOUNTER — Ambulatory Visit (INDEPENDENT_AMBULATORY_CARE_PROVIDER_SITE_OTHER): Admitting: Family Medicine

## 2023-12-06 ENCOUNTER — Encounter (INDEPENDENT_AMBULATORY_CARE_PROVIDER_SITE_OTHER): Payer: Self-pay | Admitting: Family Medicine

## 2023-12-06 VITALS — BP 120/73 | HR 78 | Temp 98.0°F | Ht 61.0 in | Wt 233.0 lb

## 2023-12-06 DIAGNOSIS — K861 Other chronic pancreatitis: Secondary | ICD-10-CM | POA: Diagnosis not present

## 2023-12-06 DIAGNOSIS — Z6841 Body Mass Index (BMI) 40.0 and over, adult: Secondary | ICD-10-CM | POA: Diagnosis not present

## 2023-12-06 DIAGNOSIS — R5383 Other fatigue: Secondary | ICD-10-CM | POA: Diagnosis not present

## 2023-12-06 DIAGNOSIS — F411 Generalized anxiety disorder: Secondary | ICD-10-CM

## 2023-12-06 DIAGNOSIS — I10 Essential (primary) hypertension: Secondary | ICD-10-CM

## 2023-12-06 DIAGNOSIS — M810 Age-related osteoporosis without current pathological fracture: Secondary | ICD-10-CM | POA: Diagnosis not present

## 2023-12-06 DIAGNOSIS — F5089 Other specified eating disorder: Secondary | ICD-10-CM

## 2023-12-06 DIAGNOSIS — E538 Deficiency of other specified B group vitamins: Secondary | ICD-10-CM | POA: Diagnosis not present

## 2023-12-06 DIAGNOSIS — R0602 Shortness of breath: Secondary | ICD-10-CM

## 2023-12-06 DIAGNOSIS — E65 Localized adiposity: Secondary | ICD-10-CM

## 2023-12-06 DIAGNOSIS — Z1331 Encounter for screening for depression: Secondary | ICD-10-CM

## 2023-12-06 NOTE — Progress Notes (Signed)
 Linda Blackburn, D.O.  ABFM, ABOM Specializing in Clinical Bariatric Medicine Office located at: [address]  [place], [place]  [address]   Bariatric Medicine Visit  Dear Linda Covey, MD   Thank you for referring Linda Blackburn to our clinic today for evaluation.  We performed a consultation to discuss her options for treatment and educate the patient on her disease state.  The following note includes my evaluation and treatment recommendations.   Please do not hesitate to reach out to me directly if you have any further concerns.   Assessment and Plan:   Orders Placed This Encounter  Procedures   EKG 12-Lead    Labs obtained today (CBC, CMP, folate, A1c, insulin, lipid panel, free T4, TSH, vitamin B12, and vitamin D) will be reviewed at her first follow up.   FOR THE DISEASE OF OBESITY: BMI 40.0-44.9, adult (HCC) -- Current BMI 44.05 Morbid obesity (HCC) Assessment & Plan: Recommended Dietary Goals Linda Blackburn is currently in the action stage of change. As such, her goal is to start our weight management plan.  She has agreed to:  Journal her daily caloric and protein intake adhering to 300-400 calories and 28 ++ g of protein at dinner.   Given patient has not tried any diets in the past and is currently skipping meals frequently ("all of them"), she will start with journaling her daily caloric and protein intake. She is to aim for 300-400 calories and 28+ g of protein for dinner. Other parameters will be set or adjusted at future visits as pt gets used to eating all her meals. The importance of journaling everything she consumes, including snacks and caloric beverages, was stressed to the patient.    Behavioral Intervention We discussed the following Behavioral Modification Strategies today: increasing lean protein intake to established goals, decreasing simple carbohydrates , avoiding skipping meals, work on meal planning and preparation, work on tracking and  journaling calories using tracking application, keeping healthy foods at home, identifying sources and decreasing liquid calories, decreasing eating out or consumption of processed foods, and making healthy choices when eating convenient foods, and discussed pre-packaged healthier meals such as [employer] All Natural, Whole 30 chicken meals, Longlife meal prep and Factor Meals etc  Additional resources provided today: Physician provided patient with handouts and personalized instruction on tracking and journaling using Apps (or how to handwrite in notebook) and using logs provided  and Handout on Food Journaling Log  Evidence-based interventions for health behavior change were utilized today including the discussion of self monitoring techniques, problem-solving barriers and SMART goal setting techniques.    Pt will specifically work on: n/a   Recommended Physical Activity Goals Linda Blackburn has been advised to work up to 150 minutes of moderate intensity aerobic activity a week and strengthening exercises 2-3 times per week for cardiovascular health, weight loss maintenance and preservation of muscle mass.   She has agreed to: maintain current level of activity.    Pharmacotherapy We discussed various medication options to help [facility] with her weight loss efforts and we both agreed to: continue with nutritional and behavioral strategies   FOR ASSOCIATED CONDITIONS ADDRESSED TODAY: Fatigue Assessment & Plan: Linda Blackburn does feel that her weight is causing her energy to be lower than it should be. Fatigue may be related to obesity, depression or many other causes. she does not appear to have any red flag symptoms and this appears to most likely be related to her current lifestyle habits and dietary intake.  Labs will be ordered and reviewed with her at their next office visit in two weeks.  Orders: Other fatigue - EKG 12-Lead - Folate - CBC with Differential/Platelet - Comprehensive metabolic panel  with GFR - Hemoglobin A1c - Insulin, random - Lipid panel - T4, free - TSH - Vitamin B12 - VITAMIN D 25 Hydroxy (Vit-D Deficiency, Fractures)   Epworth sleepiness scale is 2 and appears to be OR not be (delete one) within normal limits. Linda Blackburn admits to mild daytime somnolence and admits to waking up still tired. Patient has a history of symptoms of morning fatigue. Linda Blackburn generally gets  8-10  hours of sleep per night, and states that she has generally restful sleep most nights. Snoring is present. Apneic episodes are not present.   ECG: Performed and reviewed/interpreted independently.  Normal sinus rhythm, rate 67 bpm; reassuring without any acute abnormalities, will continue to monitor for symptoms    Shortness of breath on exertion Assessment & Plan: Linda Blackburn does feel that she gets out of breath more easily than she used to when she exercises and seems to be worsening over time with weight gain.  This has gotten worse recently. Linda Blackburn denies shortness of breath at rest or orthopnea. Pt denies chest pain, dizziness, heart palpitations, or excessive diaphoresis or nausea with activity.  This is not new and is ongoing.  Linda Blackburn's shortness of breath appears to be obesity related and exercise induced, as they do not appear to have any "red flag" symptoms/ concerns today.  Also, this condition appears to be related to a state of poor cardiovascular conditioning   Obtain labs today and will be reviewed with her at their next office visit in two weeks.  Indirect Calorimeter completed today to help guide our dietary regimen. It shows a VO2 of 204 and a REE of 1411.  Her calculated basal metabolic rate is 1610 thus her resting energy expenditure is worse than expected.  Patient agreed to work on weight loss at this time.  As Linda Blackburn progresses through our weight loss program, we will gradually increase exercise as tolerated to treat her current condition.   If Linda Blackburn follows our recommendations and loses  5-10% of their weight without improvement of her shortness of breath or if at any time, symptoms become more concerning, they agree to urgently follow up with their PCP/ specialist for further consideration/ evaluation.   Linda Blackburn verbalizes agreement with this plan.    Depression Screen  GAD (generalized anxiety disorder) / Emotional eating Assessment & Plan: Her Food and Mood (modified PHQ-9) score was 9. Moods are stable today. Denies any SI/HI. Her anxiety and panic attacks began after her husband's passing on Jan 27, 2010 and started Zoloft around this time. She states every holiday is difficult for her, and adds her wedding anniversary was on Valentine's Day. She started Zoloft at the time  Per New Patient packet: - She admits to eating as a reward and to help comfort her - Hides/Sneaks foods when at other's homes - "don't want them to know my eating habits or amount of sweets eaten" - Eats a larger portion than the average person about 2-3 times per week.  - Has not been dx with an eating disorder, but does "binge sweets sometimes" - Uses food "for enjoyment/contentment". - Cravings tend to occur int he late afternoons/early evenings.  - Snacks more than she eats regular meals.  - Skips meals - "All, often"  Patient will defer Dr.Barker referral at this time. Discussed how  thoughts affect eating habits, modeling of thoughts, feelings, and behaviors, and strategies for change.  Importance of not skipping meals and getting all her proteins and fiber in on a daily basis discussed. Behavior modification techniques were discussed today to help deal with emotional/non-hunger eating behaviors. Reminded patient of the importance of following their prudent nutrition plan and how food can affect mood as well to support emotional wellbeing. Continue with current medication regimen as prescribed by PCP. We will continue to monitor closely alongside PCP / other specialists.   Primary  hypertension Assessment & Plan: BP Readings from Last 3 Encounters:  12/06/23 120/73  11/05/23 (!) 167/83  09/07/23 (!) 142/78   Pt does not monitor her blood pressures at home. Pt is on Losartan 100 mg once daily. Tolerating well with no adverse side effects. She is currently asymptomatic; denies any chest pain, palpitations, headaches, dizziness/lightheadedness, or vision changes.   Discussed how blood pressures are expected to improve with weight loss. Lifestyle changes such as following our low salt, heart healthy meal plan and engaging in a regular exercise program discussed. Avoid buying foods that are: processed, frozen, or prepackaged to avoid excess salt. Encouraged pt to get a new blood pressure cuff and start monitoring her blood pressures at home. Reminded patient that if they ever feel poorly in any way, to check their blood pressure as well as her pulse. We will continue to monitor condition as they relate to the her weight loss journey.   Visceral obesity Assessment & Plan: Linda Blackburn visceral fat rating today is above goal at 20. This is an improvement from when last checked 1 month ago. At initial information session on 11/05/23 her visceral fat rating was at 21.   Ideal visceral fat rating of 12 for females reviewed with patient. Pt verbalized understanding. Discussed the importance of decreasing her visceral fat rating in an effort to lower cardiovascular risk. Will continue to monitor condition as it relates to her weight loss journey.    Other chronic pancreatitis (HCC) Assessment & Plan: Pt reports a hx of chronic pancreatitis. She was diagnosed in her 30s, though she notes her first pancreatitis attack was at 72 y/o. She presented to the ED on 09/30/22 for management of her most recent episode. She typically has 2-3 episodes per year, with at least one episode being severe enough to warrant hospital admission. She has seen at least 2 specialists, more recently establishing  with a specialist at [facility]. She states the weather at times will trigger her episodes.   Continue with her current regimen as directed by her care team. Patient was encouraged to follow up with her PCP and specialists for management of her condition as instructed by them. Will continue to monitor her condition alongside PCP/specialists as it relates to her weight loss journey.    B12 deficiency Assessment & Plan: Lab Results  Component Value Date   VITAMINB12 244 08/22/2022   She was diagnosed with B12 deficiency about 2 years ago and started supplementation at the time. Pt currently taking B12 1000 mcg daily. Tolerating well with no SE reported.   Reviewed ideal B12 level of 500 or more with patient, pt verbalized understanding. Discussed the benefits of optimal B12 levels with brain function, energy levels, and moods. Continue with current supplementation regimen. Will check B12 levels today and review results at her next OV. Will continue to monitor condition.   FOLLOW UP:   Follow up in 2 weeks. She was informed of  the importance of frequent follow up visits to maximize her success with intensive lifestyle modifications for her multiple health conditions.  Shawnelle Spoerl is aware that we will review all of her lab results at our next visit.  She is aware that if anything is critical/ life threatening with the results, we will be contacting her via MyChart prior to the office visit to discuss management.    Chief Complaint:   OBESITY Linda Blackburn (MR# [id number]) is a pleasant 73 y.o. female who presents for evaluation and treatment of obesity and related comorbidities. Current BMI is Body mass index is 44.02 kg/m. Linda Blackburn has been struggling with her weight for many years and has been unsuccessful in either losing weight, maintaining weight loss, or reaching her healthy weight goal.  Linda Blackburn is currently in the action stage of change and  ready to dedicate time achieving and maintaining a healthier weight. Linda Blackburn is interested in becoming our patient and working on intensive lifestyle modifications including (but not limited to) diet and exercise for weight loss.  Linda Yamilex Borgwardt is retired. Patient is widowed and has 2 adult children. She lives alone.  No hx of smoking activity.   No desired weight reported.   Reasons for desired weight loss include wanting to exercise more, reduce knee/back pain, and reduce weight/hip size.  Attributed reasons for weight gain include inactivity, poor eating habits, and using food for comfort  Has not tried diets in the past.   Eats out of the home once weekly.  Craves sweets in late afternoons and early evenings  Snacks more than eating regular meals  Snacks on chips, cookies, ice cream, and anything sweet.   Skips all meals at times -- "all of them."  Does drink coffee with cream (Half & Half) and sugar. Otherwise no other calorie beverages.   Worst food habit: "sweets"  Frequently eats in front of the TV.   Subjective:   This is the patient's first visit at [facility].  The patient's NEW PATIENT PACKET that they filled out prior to today's office visit was reviewed at length and information from that paperwork was included within the following office visit note.    Included in the packet: current and past health history, medications, allergies, ROS, gynecologic history (women only), surgical history, family history, social history, weight history, weight loss surgery history (for those that have had weight loss surgery), nutritional evaluation, mood and food questionnaire along with a depression screening (PHQ9) on all patients, an Epworth questionnaire, sleep habits questionnaire, patient life and health improvement goals questionnaire. These will all be scanned into the patient's chart under the "media" tab.   Review of Systems: Please  refer to new patient packet scanned into media. Pertinent positives were addressed with patient today.  Reviewed by clinician on day of visit: allergies, medications, problem list, medical history, surgical history, family history, social history, and previous encounter notes.  During the visit, I independently reviewed the patient's EKG, bioimpedance scale results, and indirect calorimeter results. I used this information to tailor a meal plan for the patient that will help Linda Blackburn to lose weight and will improve her obesity-related conditions going forward.  I performed a medically necessary appropriate examination and/or evaluation. I discussed the assessment and treatment plan with the patient. The patient was provided an opportunity to ask questions and all were answered. The patient agreed with the plan and demonstrated an understanding of the instructions.  Labs were ordered today (unless patient declined them) and will be reviewed with the patient at our next visit unless more critical results need to be addressed immediately. Clinical information was updated and documented in the EMR.    Objective:   PHYSICAL EXAM: Blood pressure 120/73, pulse 78, temperature 98 F (36.7 C), height 5\' 1"  (1.549 m), weight 233 lb (105.7 kg), SpO2 96%. Body mass index is 44.02 kg/m.  General: Well Developed, well nourished, and in no acute distress.  HEENT: Normocephalic, atraumatic; EOMI, sclerae are anicteric. Skin: Warm and dry, good turgor Chest:  Normal excursion, shape, no gross ABN Respiratory: No conversational dyspnea; speaking in full sentences NeuroM-Sk:  Normal gross ROM * 4 extremities  Psych: A and O *3, insight adequate, mood- full   Anthropometric Measurements Height: 5\' 1"  (1.549 m) Weight: 233 lb (105.7 kg) BMI (Calculated): 44.05 Weight at Last Visit: N/A Weight Lost Since Last Visit: N/A Weight Gained Since Last Visit: N/A Starting Weight: 233 lb Peak Weight: 244  LB Waist Measurement : 49 inches   Body Composition  Body Fat %: 52.4 % Fat Mass (lbs): 122.4 lbs Muscle Mass (lbs): 105.6 lbs Total Body Water (lbs): 77.6 lbs Visceral Fat Rating : 20   Other Clinical Data RMR: [id number] Fasting: yes Labs: yes Today's Visit #: 1 Starting Date: 12/06/23 Comments: First Visit    DIAGNOSTIC DATA REVIEWED:  BMET    Component Value Date/Time   NA 138 08/24/2023 1233   K 3.9 08/24/2023 1233   CL 107 08/24/2023 1233   CO2 21 08/24/2023 1233   GLUCOSE 128 (H) 08/24/2023 1233   BUN 21 08/24/2023 1233   CREATININE 0.72 08/24/2023 1233   CALCIUM 9.5 08/24/2023 1233   GFRNONAA >60 10/04/2022 0355   GFRAA >60 01/25/2020 0303   Lab Results  Component Value Date   HGBA1C 5.2 08/24/2023   No results found for: "INSULIN" Lab Results  Component Value Date   TSH 0.53 08/24/2023   CBC    Component Value Date/Time   WBC 12.9 (H) 08/24/2023 1233   RBC 4.90 08/24/2023 1233   HGB 14.3 08/24/2023 1233   HCT 43.8 08/24/2023 1233   PLT 250.0 08/24/2023 1233   MCV 89.5 08/24/2023 1233   MCH 29.1 10/04/2022 0355   MCHC 32.6 08/24/2023 1233   RDW 13.7 08/24/2023 1233   Iron Studies No results found for: "IRON", "TIBC", "FERRITIN", "IRONPCTSAT" Lipid Panel     Component Value Date/Time   CHOL 190 08/24/2023 1233   TRIG 87.0 08/24/2023 1233   HDL 54.40 08/24/2023 1233   CHOLHDL 3 08/24/2023 1233   VLDL 17.4 08/24/2023 1233   LDLCALC 118 (H) 08/24/2023 1233   LDLDIRECT 153.9 07/10/2013 1045   Hepatic Function Panel     Component Value Date/Time   PROT 7.4 08/24/2023 1233   ALBUMIN 4.7 08/24/2023 1233   AST 22 08/24/2023 1233   ALT 22 08/24/2023 1233   ALKPHOS 95 08/24/2023 1233   BILITOT 0.5 08/24/2023 1233   BILIDIR 0.2 01/25/2020 0303   IBILI 1.1 (H) 01/25/2020 0303      Component Value Date/Time   TSH 0.53 08/24/2023 1233   Nutritional No results found for: "VD25OH"  Attestation Statements:   I, Isabelle Course, acting as a  medical scribe for Thomasene Lot, DO., have compiled all relevant documentation for today's office visit on behalf of Thomasene Lot, DO, while in the presence of [employer], DO.  Reviewed by clinician on day of visit: allergies, medications,  problem list, medical history, surgical history, family history, social history, and previous encounter notes pertinent to patient's obesity diagnosis.  I have spent 66 minutes in the care of the patient today including: preparing to see patient (e.g. review and interpretation of tests, old notes ), obtaining and/or reviewing separately obtained history, performing a medically appropriate examination or evaluation, counseling and educating the patient, ordering medications, test or procedures, documenting clinical information in the electronic or other health care record, and independently interpreting results and communicating results to the patient, family, or caregiver   I have reviewed the above documentation for accuracy and completeness, and I agree with the above. Linda Blackburn, D.O.  The 21st Century Cures Act was signed into law in 2016 which includes the topic of electronic health records.  This provides immediate access to information in MyChart.  This includes consultation notes, operative notes, office notes, lab results and pathology reports.  If you have any questions about what you read please let us know at your next visit so we can discuss your concerns and take corrective action if need be.  We are right here with you.

## 2023-12-07 LAB — LIPID PANEL
Chol/HDL Ratio: 3.9 ratio (ref 0.0–4.4)
Cholesterol, Total: 198 mg/dL (ref 100–199)
HDL: 51 mg/dL (ref >39)
LDL Chol Calc (NIH): 128 mg/dL — ABNORMAL HIGH (ref 0–99)
Triglycerides: 108 mg/dL (ref 0–149)
VLDL Cholesterol Cal: 19 mg/dL (ref 5–40)

## 2023-12-07 LAB — CBC WITH DIFFERENTIAL/PLATELET
Basophils Absolute: 0.1 10*3/uL (ref 0.0–0.2)
Basos: 1 %
EOS (ABSOLUTE): 0.1 10*3/uL (ref 0.0–0.4)
Eos: 2 %
Hematocrit: 47.8 % — ABNORMAL HIGH (ref 34.0–46.6)
Hemoglobin: 15.4 g/dL (ref 11.1–15.9)
Immature Grans (Abs): 0 10*3/uL (ref 0.0–0.1)
Immature Granulocytes: 0 %
Lymphocytes Absolute: 2.4 10*3/uL (ref 0.7–3.1)
Lymphs: 34 %
MCH: 28.9 pg (ref 26.6–33.0)
MCHC: 32.2 g/dL (ref 31.5–35.7)
MCV: 90 fL (ref 79–97)
Monocytes Absolute: 0.5 10*3/uL (ref 0.1–0.9)
Monocytes: 7 %
Neutrophils Absolute: 4 10*3/uL (ref 1.4–7.0)
Neutrophils: 56 %
Platelets: 239 10*3/uL (ref 150–450)
RBC: 5.33 x10E6/uL — ABNORMAL HIGH (ref 3.77–5.28)
RDW: 12.8 % (ref 11.7–15.4)
WBC: 7.2 10*3/uL (ref 3.4–10.8)

## 2023-12-07 LAB — COMPREHENSIVE METABOLIC PANEL WITH GFR
ALT: 28 IU/L (ref 0–32)
AST: 34 IU/L (ref 0–40)
Albumin: 4.6 g/dL (ref 3.8–4.8)
Alkaline Phosphatase: 129 IU/L — ABNORMAL HIGH (ref 44–121)
BUN/Creatinine Ratio: 15 (ref 12–28)
BUN: 13 mg/dL (ref 8–27)
Bilirubin Total: 0.9 mg/dL (ref 0.0–1.2)
CO2: 22 mmol/L (ref 20–29)
Calcium: 9.7 mg/dL (ref 8.7–10.3)
Chloride: 100 mmol/L (ref 96–106)
Creatinine, Ser: 0.84 mg/dL (ref 0.57–1.00)
Globulin, Total: 2.7 g/dL (ref 1.5–4.5)
Glucose: 79 mg/dL (ref 70–99)
Potassium: 4 mmol/L (ref 3.5–5.2)
Sodium: 140 mmol/L (ref 134–144)
Total Protein: 7.3 g/dL (ref 6.0–8.5)
eGFR: 73 mL/min/{1.73_m2} (ref >59)

## 2023-12-07 LAB — VITAMIN D 25 HYDROXY (VIT D DEFICIENCY, FRACTURES): Vit D, 25-Hydroxy: 8.5 ng/mL — ABNORMAL LOW (ref 30.0–100.0)

## 2023-12-07 LAB — INSULIN, RANDOM: INSULIN: 7.3 u[IU]/mL (ref 2.6–24.9)

## 2023-12-07 LAB — HEMOGLOBIN A1C
Est. average glucose Bld gHb Est-mCnc: 97 mg/dL
Hgb A1c MFr Bld: 5 % (ref 4.8–5.6)

## 2023-12-07 LAB — FOLATE: Folate: 4.5 ng/mL (ref >3.0)

## 2023-12-07 LAB — T4, FREE: Free T4: 1.17 ng/dL (ref 0.82–1.77)

## 2023-12-07 LAB — TSH: TSH: 1.54 u[IU]/mL (ref 0.450–4.500)

## 2023-12-07 LAB — VITAMIN B12: Vitamin B-12: >2000 pg/mL — ABNORMAL HIGH (ref 232–1245)

## 2023-12-11 ENCOUNTER — Other Ambulatory Visit: Payer: Self-pay

## 2023-12-11 ENCOUNTER — Ambulatory Visit: Admitting: Family Medicine

## 2023-12-11 ENCOUNTER — Encounter: Payer: Self-pay | Admitting: Family Medicine

## 2023-12-11 VITALS — BP 126/80 | HR 72 | Ht 61.0 in | Wt 237.0 lb

## 2023-12-11 DIAGNOSIS — M25562 Pain in left knee: Secondary | ICD-10-CM | POA: Diagnosis not present

## 2023-12-11 DIAGNOSIS — G8929 Other chronic pain: Secondary | ICD-10-CM

## 2023-12-11 DIAGNOSIS — M170 Bilateral primary osteoarthritis of knee: Secondary | ICD-10-CM | POA: Diagnosis not present

## 2023-12-11 DIAGNOSIS — M25561 Pain in right knee: Secondary | ICD-10-CM | POA: Diagnosis not present

## 2023-12-11 MED ORDER — HYALURONAN 30 MG/2ML IX SOSY
30.0000 mg | PREFILLED_SYRINGE | Freq: Once | INTRA_ARTICULAR | Status: AC
  Administered 2023-12-11: 30 mg via INTRA_ARTICULAR

## 2023-12-11 NOTE — Progress Notes (Signed)
 I, Brandy Coleman,CMA acting as a Neurosurgeon for Clementeen Graham, MD.  Linda Blackburn is a 73 y.o. female who presents to [facility] today for exacerbation of her bilat knee pain. Pt was last seen by Dr. Denyse Amass on 08/23/23 and was given bilat knee steroid injections. Completed the Gelsyn series bilaterally, 3/3,  on 05/29/23.  Today, pt reports minimal improvement with last steroid injection for the knee. Notes that sx started to return in Jan 2025. Denies increased swelling around the knees. Some discomfort with ambulation. Sx worse at night when rolling over in bed. Plans to travel out of state in May for granddaughter's graduation.   Dx imaging: 06/03/23 L knee MRI             02/21/23 R & L knee XR  Pertinent review of systems: No fevers or chills  Relevant historical information: Hypertension obesity.  Going to healthy weight and wellness. History of chronic pancreatitis.  Exam:  BP 126/80   Pulse 72   Ht 5\' 1"  (1.549 m)   Wt 237 lb (107.5 kg)   SpO2 97%   BMI 44.78 kg/m  General: Well Developed, well nourished, and in no acute distress.   MSK: Knees bilaterally mild effusion normal motion.    Lab and Radiology Results  Linda Blackburn presents to clinic today for Orthovisc injection bilateral knee 1/3 Procedure: Real-time Ultrasound Guided Injection of right knee joint superior lateral patellar space Device: Philips Affiniti 50G/GE Logiq Images permanently stored and available for review in PACS Verbal informed consent obtained.  Discussed risks and benefits of procedure. Warned about infection, bleeding, damage to structures among others. Patient expresses understanding and agreement Time-out conducted.   Noted no overlying erythema, induration, or other signs of local infection.   Skin prepped in a sterile fashion.   Local anesthesia: Topical Ethyl chloride.   With sterile technique and under real time ultrasound guidance: Orthovisc  30 mg injected into knee joint. Fluid seen entering the joint capsule.   Completed without difficulty   Advised to call if fevers/chills, erythema, induration, drainage, or persistent bleeding.   Images permanently stored and available for review in the ultrasound unit.  Impression: Technically successful ultrasound guided injection.  Procedure: Real-time Ultrasound Guided Injection of left knee joint superior lateral patellar space Device: Philips Affiniti 50G/GE Logiq Images permanently stored and available for review in PACS Verbal informed consent obtained.  Discussed risks and benefits of procedure. Warned about infection, bleeding, damage to structures among others. Patient expresses understanding and agreement Time-out conducted.   Noted no overlying erythema, induration, or other signs of local infection.   Skin prepped in a sterile fashion.   Local anesthesia: Topical Ethyl chloride.   With sterile technique and under real time ultrasound guidance: Orthovisc 30 mg injected into knee joint. Fluid seen entering the joint capsule.   Completed without difficulty   Advised to call if fevers/chills, erythema, induration, drainage, or persistent bleeding.   Images permanently stored and available for review in the ultrasound unit.  Impression: Technically successful ultrasound guided injection. Lot number: [id number] for both injections      Assessment and Plan: 73 y.o. female with chronic bilateral knee pain due to DJD.  Plan for Orthovisc series starting today.  Return in about 2 weeks for Orthovisc injection 2/3.   PDMP not reviewed this encounter. Orders Placed This Encounter  Procedures   [place] LIMITED JOINT SPACE STRUCTURES LOW BILAT(NO LINKED CHARGES)    Reason  for Exam (SYMPTOM  OR DIAGNOSIS REQUIRED):   bilat knee pain    Preferred imaging location?:   [facility] ordered this encounter  Medications   Hyaluronan (ORTHOVISC) intra-articular  injection 30 mg   Hyaluronan (ORTHOVISC) intra-articular injection 30 mg     Discussed warning signs or symptoms. Please see discharge instructions. Patient expresses understanding.   The above documentation has been reviewed and is accurate and complete Clementeen Graham, M.D.

## 2023-12-11 NOTE — Patient Instructions (Addendum)
 Thank you for coming in today.   You received an injection today. Seek immediate medical attention if the joint becomes red, extremely painful, or is oozing fluid.

## 2023-12-20 ENCOUNTER — Encounter (INDEPENDENT_AMBULATORY_CARE_PROVIDER_SITE_OTHER): Payer: Self-pay | Admitting: Family Medicine

## 2023-12-20 ENCOUNTER — Ambulatory Visit (INDEPENDENT_AMBULATORY_CARE_PROVIDER_SITE_OTHER): Admitting: Family Medicine

## 2023-12-20 VITALS — BP 121/81 | HR 78 | Temp 98.1°F | Ht 61.0 in | Wt 230.0 lb

## 2023-12-20 DIAGNOSIS — E7849 Other hyperlipidemia: Secondary | ICD-10-CM

## 2023-12-20 DIAGNOSIS — I10 Essential (primary) hypertension: Secondary | ICD-10-CM

## 2023-12-20 DIAGNOSIS — E559 Vitamin D deficiency, unspecified: Secondary | ICD-10-CM

## 2023-12-20 DIAGNOSIS — Z6841 Body Mass Index (BMI) 40.0 and over, adult: Secondary | ICD-10-CM | POA: Diagnosis not present

## 2023-12-20 DIAGNOSIS — E88819 Insulin resistance, unspecified: Secondary | ICD-10-CM

## 2023-12-20 DIAGNOSIS — M810 Age-related osteoporosis without current pathological fracture: Secondary | ICD-10-CM

## 2023-12-20 DIAGNOSIS — E538 Deficiency of other specified B group vitamins: Secondary | ICD-10-CM

## 2023-12-20 MED ORDER — VITAMIN D (ERGOCALCIFEROL) 1.25 MG (50000 UNIT) PO CAPS: 50000.0000 [IU] | ORAL_CAPSULE | ORAL | 0 refills | Status: DC

## 2023-12-20 NOTE — Progress Notes (Signed)
 Linda Blackburn, D.O.  ABFM, ABOM Clinical Bariatric Medicine Physician  Office located at: [address] [place], [place]  [address]     Assessment and Plan:   Meds ordered this encounter  Medications   Vitamin D , Ergocalciferol , (DRISDOL ) 1.25 MG (50000 UNIT) CAPS capsule    Sig: Take 1 capsule (50,000 Units total) by mouth every 7 (seven) days.    Dispense:  4 capsule    Refill:  0     FOR THE DISEASE OF OBESITY:  BMI 40.0-44.9, adult (HCC) -- Current BMI 43.48 Morbid obesity (HCC) - start BMI 44.05 Assessment & Plan: Since last office visit on 12/06/2023 patient's Muscle mass has decreased by 1.6 lb. Fat mass has decreased by 1.4 lb. Total body water has increased by 0.6 lb.  Counseling done on how various foods will affect these numbers and how to maximize success  Total lbs lost to date: - 3 lbs  Total weight loss percentage to date: 1.29%    Recommended Dietary Goals Linda Blackburn is currently in the action stage of change. As such, her goal is to continue weight management plan.  She has agreed to: continue current plan - consider journaling plan in the future.    Behavioral Intervention We discussed the following today: strategies to increase her lean protein intake throughout the day, increasing vegetables, increasing water intake , and better snacking choices  Additional resources provided today: Handout on CAT 1 meal plan , Handout on protein equivalents of 2 ounces of meat or seafood, and Handout on insulin  resistance education  Evidence-based interventions for health behavior change were utilized today including the discussion of self monitoring techniques, problem-solving barriers and SMART goal setting techniques.  Regarding patient's less desirable eating habits and patterns, we employed the technique of small changes.   Pt will specifically work on: increasing her lean protein intake.   Recommended Physical Activity Goals Linda Blackburn has been advised to work  up to 150 minutes of moderate intensity aerobic activity a week and strengthening exercises 2-3 times per week for cardiovascular health, weight loss maintenance and preservation of muscle mass.   She has agreed to : start walking on treadmill a few days a week.    Pharmacotherapy We both agreed to : continue with nutritional and behavioral strategies   FOR ASSOCIATED CONDITIONS ADDRESSED TODAY:  Other hyperlipidemia Assessment & Plan: Lab Results  Component Value Date   CHOL 198 12/06/2023   HDL 51 12/06/2023   LDLCALC 128 (H) 12/06/2023   LDLDIRECT 153.9 07/10/2013   TRIG 108 12/06/2023   CHOLHDL 3.9 12/06/2023   The 10-year ASCVD risk score (Arnett DK, et al., 2019) is: 17.2%   Values used to calculate the score:     Age: 73 years     Sex: Female     Is Non-Hispanic African American: No     Diabetic: No     Tobacco smoker: No     Systolic Blood Pressure: 121 mmHg     Is BP treated: Yes     HDL Cholesterol: 51 mg/dL     Total Cholesterol: 198 mg/dL   Diet/lifestyle controlled. Lipid panel WNL w/ expection of LDL. Pt advised to reduce saturated and trans fats in diet. Highly encouraged pt to discuss with PCP about possibly initiating a statin given her elevated ASCVD risk score. Continue heart healthy meal plan. Increase exercise as able in the future.    Insulin  resistance - new onset Assessment & Plan: Lab Results  Component Value Date   HGBA1C 5.0 12/06/2023   HGBA1C 5.2 08/24/2023   INSULIN  7.3 12/06/2023   Lab Results  Component Value Date   CREATININE 0.84 12/06/2023   BUN 13 12/06/2023   NA 140 12/06/2023   K 4.0 12/06/2023   CL 100 12/06/2023   CO2 22 12/06/2023      Component Value Date/Time   PROT 7.3 12/06/2023 1232   ALBUMIN 4.6 12/06/2023 1232   AST 34 12/06/2023 1232   ALT 28 12/06/2023 1232   ALKPHOS 129 (H) 12/06/2023 1232   BILITOT 0.9 12/06/2023 1232   BILIDIR 0.2 01/25/2020 0303   IBILI 1.1 (H) 01/25/2020 0303   Lab Results   Component Value Date   WBC 7.2 12/06/2023   HGB 15.4 12/06/2023   HCT 47.8 (H) 12/06/2023   MCV 90 12/06/2023   PLT 239 12/06/2023   Lab Results  Component Value Date   TSH 1.540 12/06/2023   FREET4 1.17 12/06/2023    Reviewed labs above. Her HgbA1c is at goal. Her Fasting insulin  is slightly elevated at 7.3; goal <5. Her kidney function, electrolytes, and thyroid  levels are WNL. Her Alkaline Phosphate is mildly elevated at 129; this could be related to her deficiency in vitamin D . Other liver enzymes are normal. Blood counts are stable.   Patient aware of disease state and risk of progression. Educated patient that having adequate amounts of protein with each meal is important for increasing muscle mass, stabilizing sugars, controlling hunger and cravings, and improving thermogenesis. Continue to decrease simple carbs/ sugars; increase fiber and proteins -> follow her meal plan.  Encouraged pt to read more about this condition on the American Diabetes Association website.   Primary hypertension Assessment & Plan: Last 3 blood pressure readings in our office are as follows: BP Readings from Last 3 Encounters:  12/20/23 121/81  12/11/23 126/80  12/06/23 120/73   Blood pressure is well-controlled today on Losartan  100 mg daily. She purchased an Omron Series 3 BP monitor. Checked her BP at home on one occasion: 133/72. Check BP at home every other day. Continue same regimen and low sodium diet. Increase exercise as able in the future.    Postmenopausal bone loss/ Vitamin D  deficiency  Assessment & Plan: Most recent Vitamin D : Lab Results  Component Value Date   VD25OH 8.5 (L) 12/06/2023   H/o postmenopausal bone loss. Her Vitamin D  levels are not at goal of 50 to 70. I discussed the importance of vitamin D  to the patient's health and well-being as well as to their ability to lose weight. After discussion of benefits, alternative treatment options and side effects patient will be  started on once weekly strength Vitamin D . Also recommend daily calcium supplement 600-800 mg. Recheck in 3 months.    B12 deficiency Assessment & Plan: Lab Results  Component Value Date   VITAMINB12 >2000 (H) 12/06/2023   FOLATE 4.5 12/06/2023    Folate level is normal. B12 is elevated. Pt not sure if she is taking OTC B12 100 mcg or 1000 mcg daily; she will let us  know next OV.  For now, pt instructed to take her B12 every other day. Recheck levels in 3 mos.    FOLLOW UP:   Return 01/07/2024. She was informed of the importance of frequent follow up visits to maximize her success with intensive lifestyle modifications for her multiple health conditions.  Subjective:   Chief complaint: Obesity Remedy is here to discuss her progress with her obesity treatment  plan. She is on the Category 1 Plan with B/L choices and has the option to journal dinner (300-400 calories and 28+ grams protein). States she is following her eating plan approximately 100 % of the time. She states she is not exercising.  Interval History:  Linda Blackburn is here today for her first follow-up office visit since starting the program with us . Patient is off to a good start and has lost 3 lbs. She had no issues with her breakfast and lunch foods. States that it was difficult to get in the recommended 6 ounces of lean protein at dinner. Did not journal dinner. Using her snack calories on low calorie ice-cream and coffee creamer. No c/o hunger and cravings today.   Pharmacotherapy for weight loss: none  Review of Systems:  Pertinent positives were addressed with patient today.  Reviewed by clinician on day of visit: allergies, medications, problem list, medical history, surgical history, family history, social history, and previous encounter notes.   Weight Summary and Biometrics   Weight Lost Since Last Visit: 3lb  Weight Gained Since Last Visit: 0   Vitals Temp: 98.1 F (36.7 C) BP: 121/81 Pulse  Rate: 78 SpO2: 98 %   Anthropometric Measurements Height: 5\' 1"  (1.549 m) Weight: 230 lb (104.3 kg) BMI (Calculated): 43.48 Weight at Last Visit: 233lb Weight Lost Since Last Visit: 3lb Weight Gained Since Last Visit: 0 Starting Weight: 233lb Total Weight Loss (lbs): 3 lb (1.361 kg) Peak Weight: 244lb   Body Composition  Body Fat %: 52.5 % Fat Mass (lbs): 121 lbs Muscle Mass (lbs): 104 lbs Total Body Water (lbs): 78.2 lbs Visceral Fat Rating : 20   Other Clinical Data Fasting: no Labs: no Today's Visit #: 2 Starting Date: 12/06/23 Comments: First FU   Objective:   PHYSICAL EXAM:  Blood pressure 121/81, pulse 78, temperature 98.1 F (36.7 C), height 5\' 1"  (1.549 m), weight 230 lb (104.3 kg), SpO2 98%. Body mass index is 43.46 kg/m.  General: she is overweight, cooperative and in no acute distress.   HEENT: EOMI, sclerae are anicteric. Lungs: Normal breathing effort, no conversational dyspnea. M-Sk:  Normal gross ROM * 4 extremities  PSYCH: Has normal mood, affect and thought process. Neurologic: No gross sensory or motor deficits. Well developed, A and O * 3  DIAGNOSTIC DATA REVIEWED:  BMET    Component Value Date/Time   NA 140 12/06/2023 1232   K 4.0 12/06/2023 1232   CL 100 12/06/2023 1232   CO2 22 12/06/2023 1232   GLUCOSE 79 12/06/2023 1232   GLUCOSE 128 (H) 08/24/2023 1233   BUN 13 12/06/2023 1232   CREATININE 0.84 12/06/2023 1232   CALCIUM 9.7 12/06/2023 1232   GFRNONAA >60 10/04/2022 0355   GFRAA >60 01/25/2020 0303   Lab Results  Component Value Date   HGBA1C 5.0 12/06/2023   HGBA1C 5.2 08/24/2023   Lab Results  Component Value Date   INSULIN  7.3 12/06/2023   Lab Results  Component Value Date   TSH 1.540 12/06/2023   CBC    Component Value Date/Time   WBC 7.2 12/06/2023 1232   WBC 12.9 (H) 08/24/2023 1233   RBC 5.33 (H) 12/06/2023 1232   RBC 4.90 08/24/2023 1233   HGB 15.4 12/06/2023 1232   HCT 47.8 (H) 12/06/2023 1232    PLT 239 12/06/2023 1232   MCV 90 12/06/2023 1232   MCH 28.9 12/06/2023 1232   MCH 29.1 10/04/2022 0355   MCHC 32.2 12/06/2023 1232  MCHC 32.6 08/24/2023 1233   RDW 12.8 12/06/2023 1232   Iron Studies No results found for: "IRON", "TIBC", "FERRITIN", "IRONPCTSAT" Lipid Panel     Component Value Date/Time   CHOL 198 12/06/2023 1232   TRIG 108 12/06/2023 1232   HDL 51 12/06/2023 1232   CHOLHDL 3.9 12/06/2023 1232   CHOLHDL 3 08/24/2023 1233   VLDL 17.4 08/24/2023 1233   LDLCALC 128 (H) 12/06/2023 1232   LDLDIRECT 153.9 07/10/2013 1045   Hepatic Function Panel     Component Value Date/Time   PROT 7.3 12/06/2023 1232   ALBUMIN 4.6 12/06/2023 1232   AST 34 12/06/2023 1232   ALT 28 12/06/2023 1232   ALKPHOS 129 (H) 12/06/2023 1232   BILITOT 0.9 12/06/2023 1232   BILIDIR 0.2 01/25/2020 0303   IBILI 1.1 (H) 01/25/2020 0303      Component Value Date/Time   TSH 1.540 12/06/2023 1232   Nutritional Lab Results  Component Value Date   VD25OH 8.5 (L) 12/06/2023    Attestations:   I, Special Puri, acting as a Stage manager for Linda Sensor, DO., have compiled all relevant documentation for today's office visit on behalf of Linda Sensor, DO, while in the presence of [employer], DO.  Reviewed by clinician on day of visit: allergies, medications, problem list, medical history, surgical history, family history, social history, and previous encounter notes pertinent to patient's obesity diagnosis.   I have spent 55 minutes in the care of the patient today. Specifically: 50 minutes of direct face to face counseling was spent explaining how the foods she eats will affect her cholesterol, blood sugars, and blood pressure. Extensive counseling was done regarding her nutritional plan, the difference between a carb and protein and how these will affect her hunger and craving signals. This was in addition to the above counseling. 5 minutes was spent after the visit on additional  documentation and chart review.   I have reviewed the above documentation for accuracy and completeness, and I agree with the above. Linda Blackburn, D.O.  The 21st Century Cures Act was signed into law in 2016 which includes the topic of electronic health records.  This provides immediate access to information in MyChart.  This includes consultation notes, operative notes, office notes, lab results and pathology reports.  If you have any questions about what you read please let us  know at your next visit so we can discuss your concerns and take corrective action if need be.  We are right here with you.

## 2023-12-20 NOTE — Patient Instructions (Signed)
 The 10-year ASCVD risk score (Arnett DK, et al., 2019) is: 17.2%   Values used to calculate the score:     Age: 73 years     Sex: Female     Is Non-Hispanic African American: No     Diabetic: No     Tobacco smoker: No     Systolic Blood Pressure: 121 mmHg     Is BP treated: Yes     HDL Cholesterol: 51 mg/dL     Total Cholesterol: 198 mg/dL

## 2023-12-24 ENCOUNTER — Telehealth: Payer: Self-pay

## 2023-12-24 ENCOUNTER — Ambulatory Visit (INDEPENDENT_AMBULATORY_CARE_PROVIDER_SITE_OTHER): Admitting: Family Medicine

## 2023-12-24 ENCOUNTER — Other Ambulatory Visit: Payer: Self-pay

## 2023-12-24 DIAGNOSIS — M170 Bilateral primary osteoarthritis of knee: Secondary | ICD-10-CM

## 2023-12-24 DIAGNOSIS — M25562 Pain in left knee: Secondary | ICD-10-CM | POA: Diagnosis not present

## 2023-12-24 DIAGNOSIS — M25561 Pain in right knee: Secondary | ICD-10-CM

## 2023-12-24 DIAGNOSIS — G8929 Other chronic pain: Secondary | ICD-10-CM

## 2023-12-24 MED ORDER — HYALURONAN 30 MG/2ML IX SOSY
30.0000 mg | PREFILLED_SYRINGE | Freq: Once | INTRA_ARTICULAR | Status: AC
  Administered 2023-12-24: 30 mg via INTRA_ARTICULAR

## 2023-12-24 NOTE — Telephone Encounter (Signed)
 Left message for the patient to return my call.

## 2023-12-24 NOTE — Progress Notes (Signed)
 Linda Blackburn presents to clinic today for Orthovisc injection bilateral knee 2/3 Procedure: Real-time Ultrasound Guided Injection of right knee joint superior lateral patellar space Device: Philips Affiniti 50G/GE Logiq Images permanently stored and available for review in PACS Verbal informed consent obtained.  Discussed risks and benefits of procedure. Warned about infection, bleeding, damage to structures among others. Patient expresses understanding and agreement Time-out conducted.   Noted no overlying erythema, induration, or other signs of local infection.   Skin prepped in a sterile fashion.   Local anesthesia: Topical Ethyl chloride.   With sterile technique and under real time ultrasound guidance: Orthovisc 30 mg injected into knee joint. Fluid seen entering the joint capsule.   Completed without difficulty   Advised to call if fevers/chills, erythema, induration, drainage, or persistent bleeding.   Images permanently stored and available for review in the ultrasound unit.  Impression: Technically successful ultrasound guided injection.  Procedure: Real-time Ultrasound Guided Injection of left knee joint superior lateral patellar space Device: Philips Affiniti 50G/GE Logiq Images permanently stored and available for review in PACS Verbal informed consent obtained.  Discussed risks and benefits of procedure. Warned about infection, bleeding, damage to structures among others. Patient expresses understanding and agreement Time-out conducted.   Noted no overlying erythema, induration, or other signs of local infection.   Skin prepped in a sterile fashion.   Local anesthesia: Topical Ethyl chloride.   With sterile technique and under real time ultrasound guidance: Orthovisc 30 mg injected into knee joint. Fluid seen entering the joint capsule.   Completed without difficulty   Advised to call if fevers/chills, erythema, induration, drainage, or persistent bleeding.   Images  permanently stored and available for review in the ultrasound unit.  Impression: Technically successful ultrasound guided injection. Lot number: [id number] Return in 1 week for Orthovisc injection bilateral knees 3/3

## 2023-12-24 NOTE — Patient Instructions (Signed)
Call or go to the ER if you develop a large red swollen joint with extreme pain or oozing puss.   

## 2023-12-24 NOTE — Telephone Encounter (Signed)
 Patient informed of message below. F/u scheduled to discuss

## 2023-12-24 NOTE — Telephone Encounter (Signed)
 Copied from CRM [id number]. Topic: General - Other >> Dec 24, 2023 11:51 AM Albertha Alosa wrote: Reason for CRM: Patient stated she saw Dr.Opalski and she stated that her bad cholesterol  is was high and suggested shee needed to see her pcp in order to get prescribe something for it , Patient would like to know if she needs to schedule an appointment with Dr.Burchette or can he just prescribed the medication and send to the pharmacy would like a callback regarding this    [phone]

## 2023-12-31 ENCOUNTER — Encounter: Payer: Self-pay | Admitting: Family Medicine

## 2023-12-31 ENCOUNTER — Ambulatory Visit: Payer: Self-pay

## 2023-12-31 ENCOUNTER — Ambulatory Visit: Admitting: Family Medicine

## 2023-12-31 ENCOUNTER — Ambulatory Visit (INDEPENDENT_AMBULATORY_CARE_PROVIDER_SITE_OTHER): Admitting: Family Medicine

## 2023-12-31 VITALS — BP 122/66 | HR 81 | Temp 97.9°F | Wt 230.7 lb

## 2023-12-31 DIAGNOSIS — M25561 Pain in right knee: Secondary | ICD-10-CM

## 2023-12-31 DIAGNOSIS — G8929 Other chronic pain: Secondary | ICD-10-CM

## 2023-12-31 DIAGNOSIS — M170 Bilateral primary osteoarthritis of knee: Secondary | ICD-10-CM | POA: Diagnosis not present

## 2023-12-31 DIAGNOSIS — M25562 Pain in left knee: Secondary | ICD-10-CM

## 2023-12-31 DIAGNOSIS — E785 Hyperlipidemia, unspecified: Secondary | ICD-10-CM

## 2023-12-31 MED ORDER — HYALURONAN 30 MG/2ML IX SOSY
30.0000 mg | PREFILLED_SYRINGE | Freq: Once | INTRA_ARTICULAR | Status: AC
  Administered 2023-12-31: 30 mg via INTRA_ARTICULAR

## 2023-12-31 MED ORDER — HYALURONAN 30 MG/2ML IX SOSY
30.0000 mg | PREFILLED_SYRINGE | Freq: Once | INTRA_ARTICULAR | Status: AC
Start: 2023-12-31 — End: 2023-12-31
  Administered 2023-12-31: 30 mg via INTRA_ARTICULAR

## 2023-12-31 NOTE — Patient Instructions (Signed)
 We could consider lipoprotein a or NMR profile at some point to further risk stratify.

## 2023-12-31 NOTE — Progress Notes (Signed)
 Established Patient Office Visit  Subjective   Patient ID: Linda Blackburn, female    DOB: March 31, 1951  Age: 73 y.o. MRN: [id number]  Chief Complaint  Patient presents with   Medical Management of Chronic Issues    HPI   Linda Blackburn is here to discuss recent mildly elevated lipids.  She has been followed by medical weight management and very pleased with her care there.  She has made some definite diet changes and currently following very low calorie diet of 900 to 1000 cal/day.  Is already lost about 15 pounds since she was here last.  Feels better overall.  She had recent total cholesterol 198, triglycerides 108, LDL 128, HDL 51.  No family history of premature CAD.  She has no siblings.  Her 10-year calculated risk for CAD is 17.4%.  No history of diabetes.  Recent A1c 5.0%.  Non-smoker.  Does have history of hypertension which is currently controlled with losartan   No recent chest pains.  She has had some ongoing knee pains which have limited activity somewhat but she hopes to start walking on treadmill soon.  Had some recent knee injections per sports medicine which have helped slightly.  Past Medical History:  Diagnosis Date   Abnormal uterine bleeding    Amenorrhea    Anxiety    Anxiety    B12 deficiency    Back pain    Constipation    Depression    Gallstones    GERD (gastroesophageal reflux disease)    Hay fever    HTN (hypertension)    IBS (irritable bowel syndrome)    Osteoarthritis    Palpitations    Pancreatic disease    Pancreatitis    Shortness of breath    Swallowing difficulty    Swelling of lower extremity    Urine incontinence    UTI (urinary tract infection)    reoccuring    Past Surgical History:  Procedure Laterality Date   ABDOMINAL HYSTERECTOMY  1984   fibroids   APPENDECTOMY  1984   BILIARY DILATION  05/29/2019   Procedure: BILIARY DILATION;  Surgeon: Normie Becton., MD;  Location: [facility];  Service: Gastroenterology;;   BIOPSY   05/29/2019   Procedure: BIOPSY;  Surgeon: Normie Becton., MD;  Location: [facility];  Service: Gastroenterology;;   CATARACT EXTRACTION Right 11/22/2021   CHOLECYSTECTOMY  09/08/1999   ERCP N/A 05/29/2019   Procedure: ENDOSCOPIC RETROGRADE CHOLANGIOPANCREATOGRAPHY (ERCP);  Surgeon: Normie Becton., MD;  Location: [facility];  Service: Gastroenterology;  Laterality: N/A;   ERCP W/ SPHINCTEROTOMY AND BALLOON DILATION  08/2008   ESOPHAGOGASTRODUODENOSCOPY (EGD) WITH PROPOFOL  N/A 05/29/2019   Procedure: ESOPHAGOGASTRODUODENOSCOPY (EGD) WITH PROPOFOL ;  Surgeon: Brice Campi Albino Alu., MD;  Location: [facility];  Service: Gastroenterology;  Laterality: N/A;   OOPHORECTOMY     Only 1 was taken   POLYPECTOMY  05/29/2019   Procedure: POLYPECTOMY;  Surgeon: Mansouraty, Albino Alu., MD;  Location: [facility];  Service: Gastroenterology;;   REMOVAL OF STONES  05/29/2019   Procedure: REMOVAL OF STONES;  Surgeon: Normie Becton., MD;  Location: [facility];  Service: Gastroenterology;;   TONSILLECTOMY     TUBAL LIGATION      reports that she has never smoked. She has never used smokeless tobacco. She reports that she does not currently use alcohol. She reports that she does not use drugs. family history includes Alcohol abuse in her mother; Cerebral palsy in her granddaughter; Depression in her mother; Hiatal hernia in her maternal  aunt; Obesity in her mother; Prostate cancer in her father and paternal grandfather; Stroke in her maternal aunt. Allergies  Allergen Reactions   Other Hives and Swelling    AQUASONIC US  GEL Patient did not have a reaction in 2024 when this was used inadvertently to image the knee.   Buprenorphine Hcl    Codeine     GI upset   Dilaudid  [Hydromorphone  Hcl] Itching   Morphine  And Codeine     Ants crawling on skin   Diclofenac  Sodium Rash    Per pt rash behind knee     Review of Systems  Constitutional:  Negative for malaise/fatigue.   Eyes:  Negative for blurred vision.  Respiratory:  Negative for shortness of breath.   Cardiovascular:  Negative for chest pain.  Neurological:  Negative for dizziness, weakness and headaches.      Objective:     BP 122/66 (BP Location: Left Arm, Patient Position: Sitting, Cuff Size: Large)   Pulse 81   Temp 97.9 F (36.6 C) (Oral)   Wt 230 lb 11.2 oz (104.6 kg)   SpO2 97%   BMI 43.59 kg/m  BP Readings from Last 3 Encounters:  12/31/23 122/66  12/20/23 121/81  12/11/23 126/80   Wt Readings from Last 3 Encounters:  12/31/23 230 lb 11.2 oz (104.6 kg)  12/20/23 230 lb (104.3 kg)  12/11/23 237 lb (107.5 kg)      Physical Exam Vitals reviewed.  Constitutional:      Appearance: She is well-developed.  Eyes:     Pupils: Pupils are equal, round, and reactive to light.  Neck:     Thyroid : No thyromegaly.     Vascular: No JVD.  Cardiovascular:     Rate and Rhythm: Normal rate and regular rhythm.     Heart sounds:     No gallop.  Pulmonary:     Effort: Pulmonary effort is normal. No respiratory distress.     Breath sounds: Normal breath sounds. No wheezing or rales.  Musculoskeletal:     Cervical back: Neck supple.  Neurological:     Mental Status: She is alert.      No results found for any visits on 12/31/23.  Last CBC Lab Results  Component Value Date   WBC 7.2 12/06/2023   HGB 15.4 12/06/2023   HCT 47.8 (H) 12/06/2023   MCV 90 12/06/2023   MCH 28.9 12/06/2023   RDW 12.8 12/06/2023   PLT 239 12/06/2023   Last metabolic panel Lab Results  Component Value Date   GLUCOSE 79 12/06/2023   NA 140 12/06/2023   K 4.0 12/06/2023   CL 100 12/06/2023   CO2 22 12/06/2023   BUN 13 12/06/2023   CREATININE 0.84 12/06/2023   EGFR 73 12/06/2023   CALCIUM 9.7 12/06/2023   PHOS 2.5 11/19/2018   PROT 7.3 12/06/2023   ALBUMIN 4.6 12/06/2023   LABGLOB 2.7 12/06/2023   BILITOT 0.9 12/06/2023   ALKPHOS 129 (H) 12/06/2023   AST 34 12/06/2023   ALT 28 12/06/2023    ANIONGAP 11 10/04/2022   Last lipids Lab Results  Component Value Date   CHOL 198 12/06/2023   HDL 51 12/06/2023   LDLCALC 128 (H) 12/06/2023   LDLDIRECT 153.9 07/10/2013   TRIG 108 12/06/2023   CHOLHDL 3.9 12/06/2023   Last hemoglobin A1c Lab Results  Component Value Date   HGBA1C 5.0 12/06/2023   Last thyroid  functions Lab Results  Component Value Date   TSH 1.540 12/06/2023  The 10-year ASCVD risk score (Arnett DK, et al., 2019) is: 17.4%    Assessment & Plan:   Mild hyperlipidemia.  Patient does have 10-year calculated risk of CAD 17.4% but no family history of premature CAD and no prior history of cerebrovascular, peripheral vascular, or cardiac disease..  We discussed limitations of coronary calcium score given her age.  We did discuss other risk stratifiers such as LP(a) or NMR profile but doubt insurance would cover for either.  After long discussion of pros and cons of statin use she would like to give several months of attempted effort at weight loss and starting more consistent exercise.  We discussed importance of low saturated fat diet.  Consider follow-up fasting lipids in 4 to 6 months  Glean Lamy, MD

## 2023-12-31 NOTE — Patient Instructions (Signed)
 Thank you for coming in today.   You received an injection today. Seek immediate medical attention if the joint becomes red, extremely painful, or is oozing fluid.

## 2023-12-31 NOTE — Progress Notes (Signed)
 Linda Blackburn presents to clinic today for Orthovisc injection bilateral knee 3/3 Procedure: Real-time Ultrasound Guided Injection of right knee joint superior lateral patellar space Device: Philips Affiniti 50G/GE Logiq Images permanently stored and available for review in PACS Verbal informed consent obtained.  Discussed risks and benefits of procedure. Warned about infection, bleeding, damage to structures among others. Patient expresses understanding and agreement Time-out conducted.   Noted no overlying erythema, induration, or other signs of local infection.   Skin prepped in a sterile fashion.   Local anesthesia: Topical Ethyl chloride.   With sterile technique and under real time ultrasound guidance: Orthovisc 30 mg injected into knee joint. Fluid seen entering the joint capsule.   Completed without difficulty   Advised to call if fevers/chills, erythema, induration, drainage, or persistent bleeding.   Images permanently stored and available for review in the ultrasound unit.  Impression: Technically successful ultrasound guided injection.  Procedure: Real-time Ultrasound Guided Injection of left knee joint superior lateral patellar space Device: Philips Affiniti 50G/GE Logiq Images permanently stored and available for review in PACS Verbal informed consent obtained.  Discussed risks and benefits of procedure. Warned about infection, bleeding, damage to structures among others. Patient expresses understanding and agreement Time-out conducted.   Noted no overlying erythema, induration, or other signs of local infection.   Skin prepped in a sterile fashion.   Local anesthesia: Topical Ethyl chloride.   With sterile technique and under real time ultrasound guidance: Orthovisc 30 mg injected into knee joint. Fluid seen entering the joint capsule.   Completed without difficulty   Advised to call if fevers/chills, erythema, induration, drainage, or persistent bleeding.   Images  permanently stored and available for review in the ultrasound unit.  Impression: Technically successful ultrasound guided injection. Lot number: [id number] for both injections.  Recheck as needed.

## 2024-01-07 ENCOUNTER — Ambulatory Visit (INDEPENDENT_AMBULATORY_CARE_PROVIDER_SITE_OTHER): Admitting: Family Medicine

## 2024-01-07 ENCOUNTER — Encounter (INDEPENDENT_AMBULATORY_CARE_PROVIDER_SITE_OTHER): Payer: Self-pay | Admitting: Family Medicine

## 2024-01-07 VITALS — BP 138/81 | HR 71 | Temp 97.6°F | Ht 61.0 in | Wt 227.0 lb

## 2024-01-07 DIAGNOSIS — I10 Essential (primary) hypertension: Secondary | ICD-10-CM | POA: Diagnosis not present

## 2024-01-07 DIAGNOSIS — E88819 Insulin resistance, unspecified: Secondary | ICD-10-CM | POA: Diagnosis not present

## 2024-01-07 DIAGNOSIS — E559 Vitamin D deficiency, unspecified: Secondary | ICD-10-CM

## 2024-01-07 DIAGNOSIS — E785 Hyperlipidemia, unspecified: Secondary | ICD-10-CM

## 2024-01-07 DIAGNOSIS — E538 Deficiency of other specified B group vitamins: Secondary | ICD-10-CM

## 2024-01-07 DIAGNOSIS — Z6841 Body Mass Index (BMI) 40.0 and over, adult: Secondary | ICD-10-CM

## 2024-01-07 MED ORDER — VITAMIN D (ERGOCALCIFEROL) 1.25 MG (50000 UNIT) PO CAPS: 50000.0000 [IU] | ORAL_CAPSULE | ORAL | 0 refills | Status: DC

## 2024-01-07 NOTE — Progress Notes (Signed)
 Linda Blackburn, D.O.  ABFM, ABOM Specializing in Clinical Bariatric Medicine  Office located at: [address] [place], [place]  [address]   Assessment and Plan:  No orders of the defined types were placed in this encounter.  Medications Discontinued During This Encounter  Medication Reason   Vitamin D , Ergocalciferol , (DRISDOL ) 1.25 MG (50000 UNIT) CAPS capsule Reorder    Meds ordered this encounter  Medications   Vitamin D , Ergocalciferol , (DRISDOL ) 1.25 MG (50000 UNIT) CAPS capsule    Sig: Take 1 capsule (50,000 Units total) by mouth every 7 (seven) days.    Dispense:  12 capsule    Refill:  0     FOR THE DISEASE OF OBESITY:  BMI 40.0-44.9, adult (HCC) -- Current BMI 42.91 Morbid obesity (HCC) - start BMI 44.05 Assessment & Plan: Since last office visit on 12/20/2023, patient's muscle mass has decreased by 1.2 lbs. Fat mass has decreased by 2.2 lbs. Total body water has decreased by 0.2 lbs.  Counseling done on how various foods will affect these numbers and how to maximize success  Total lbs lost to date: 6 lbs Total weight loss percentage to date: 2.58%    Recommended Dietary Goals Linda Blackburn is currently in the action stage of change. As such, her goal is to continue weight management plan.  She has agreed to: continue current plan   Behavioral Intervention We discussed the following today: continue to work on maintaining a reduced calorie state, getting the recommended amount of protein, incorporating whole foods, making healthy choices, staying well hydrated and practicing mindfulness when eating.  Additional resources provided today: Handout on Examples of Low Glycemic Index and Low Calorie Fruits & Vegetables  Evidence-based interventions for health behavior change were utilized today including the discussion of self monitoring techniques, problem-solving barriers and SMART goal setting techniques.   Regarding patient's less desirable eating habits and  patterns, we employed the technique of small changes.   Pt will specifically work on: n/a   Recommended Physical Activity Goals Linda Blackburn has been advised to work up to 300-450 minutes of moderate intensity aerobic activity a week and strengthening exercises 2-3 times per week for cardiovascular health, weight loss maintenance and preservation of muscle mass.   She has agreed to: Walk 5-10 minutes daily   Pharmacotherapy We both agreed to : continue with nutritional and behavioral strategies   ASSOCIATED CONDITIONS ADDRESSED TODAY: Insulin  resistance Assessment & Plan: Pt is not taking any medications for this condition. Diet/exercise approach. Hunger/cravings well controlled. Anticipatory guidance given. Continue to decrease simple carbs/ sugars; increase fiber and proteins -> follow her meal plan. Will recheck fasting insulin  and A1c 3 months from last obtained results.    Hyperlipidemia, unspecified hyperlipidemia type Assessment & Plan: Lab Results  Component Value Date   CHOL 198 12/06/2023   HDL 51 12/06/2023   LDLCALC 128 (H) 12/06/2023   LDLDIRECT 153.9 07/10/2013   TRIG 108 12/06/2023   CHOLHDL 3.9 12/06/2023   The 10-year ASCVD risk score (Arnett DK, et al., 2019) is: 24%   Values used to calculate the score:     Age: 73 years     Sex: Female     Is Non-Hispanic African American: No     Diabetic: No     Tobacco smoker: No     Systolic Blood Pressure: 138 mmHg     Is BP treated: Yes     HDL Cholesterol: 51 mg/dL     Total Cholesterol: 198 mg/dL Linda Blackburn  recently saw her PCP on 04/28, reviewed notes from OV with pt. Stressed importance of lowering ascvd score and strategies to decrease percentage. Discussed FHx and recent lipid levels, including elevated LDL. Pt prefers to hold off on medications and will continue to modify risk via diet/exercise and weight loss journey. Encouraged pt to consider obtaining Coronary Calcium Score in about 4-6 months if lipid levels do not  improve. Reiterated importance of following low cholesterol, heart-healthy diet and engaging in consistent physical activity. Will monitor closely alongside PCP.   Primary hypertension Assessment & Plan: BP Readings from Last 3 Encounters:  01/07/24 138/81  12/31/23 122/66  12/20/23 121/81  Pt is taking Cozaar  100 mg daily. BP has slightly increased since LOV, but is still within goal of 140/90. Condition well controlled. Linda Blackburn endorses monitoring BP levels at home, systolic averaging 130. Informed pt that weight loss can improve condition and consistently decrease BP. Continue monitoring at home 2-3 times per week. Continue current medication regimen. Will continue to monitor.    B12 deficiency Assessment & Plan: Linda Blackburn is on Cyanocobalamin  1000 mcg every other day. Dose was decreased d/t elevated levels LOV. Tolerating well -- no SE. No acute concerns today in this regard. Continue supplement at current dose. Will monitor levels regularly to avoid oversupplementation.   Vitamin D  deficiency Assessment & Plan: Linda Blackburn taking high dose OTC Vit D once weekly. This supplement was started during LOV. No adverse SE reported. Continue current supplementation regimen. Will refill today.   Relevant Orders: -     Vitamin D  (Ergocalciferol ); Take 1 capsule (50,000 Units total) by mouth every 7 (seven) days.  Dispense: 12 capsule; Refill: 0  Follow up:   Return in about 16 days (around 01/23/2024). She was informed of the importance of frequent follow up visits to maximize her success with intensive lifestyle modifications for her multiple health conditions.  Subjective:   Chief complaint: Obesity Linda Blackburn is here to discuss her progress with her obesity treatment plan. She is on the Category 1 Plan with B/L choices and has the option to journal dinner (300-400 calories and 28+ grams protein) and states she is following her eating plan approximately 100% of the time. She states she is not  exercising.  Interval History:  Linda Blackburn is here for a follow up office visit. Since last OV on 12/20/2023, pt is down 3 lbs. She is very compliant with her meal plan, however Linda Blackburn reports sometimes feeling too full from foods. Sample snacks include yogurt with fresh fruit, cottage cheese, or two cheese sticks.   Pharmacotherapy for weight loss: She is currently taking no anti-obesity medication.   Review of Systems:  Pertinent positives were addressed with patient today.  Reviewed by clinician on day of visit: allergies, medications, problem list, medical history, surgical history, family history, social history, and previous encounter notes.  Weight Summary and Biometrics   Weight Lost Since Last Visit: 3lb  Weight Gained Since Last Visit: 0lb   Vitals Temp: 97.6 F (36.4 C) BP: 138/81 Pulse Rate: 71 SpO2: 98 %   Anthropometric Measurements Height: 5\' 1"  (1.549 m) Weight: 227 lb (103 kg) BMI (Calculated): 42.91 Weight at Last Visit: 230lb Weight Lost Since Last Visit: 3lb Weight Gained Since Last Visit: 0lb Starting Weight: 233lb Total Weight Loss (lbs): 6 lb (2.722 kg) Peak Weight: 244lb Waist Measurement : 49 inches   Body Composition  Body Fat %: 52.3 % Fat Mass (lbs): 118.8 lbs Muscle Mass (lbs): 102.8 lbs Total Body Water (  lbs): 78 lbs Visceral Fat Rating : 19   Other Clinical Data Fasting: No Labs: no Today's Visit #: 3 Starting Date: 12/06/23    Objective:   PHYSICAL EXAM: Blood pressure 138/81, pulse 71, temperature 97.6 F (36.4 C), height 5\' 1"  (1.549 m), weight 227 lb (103 kg), SpO2 98%. Body mass index is 42.89 kg/m.  General: she is overweight, cooperative and in no acute distress. PSYCH: Has normal mood, affect and thought process.   HEENT: EOMI, sclerae are anicteric. Lungs: Normal breathing effort, no conversational dyspnea. Extremities: Moves * 4 Neurologic: A and O * 3, good insight  DIAGNOSTIC DATA  REVIEWED: BMET    Component Value Date/Time   NA 140 12/06/2023 1232   K 4.0 12/06/2023 1232   CL 100 12/06/2023 1232   CO2 22 12/06/2023 1232   GLUCOSE 79 12/06/2023 1232   GLUCOSE 128 (H) 08/24/2023 1233   BUN 13 12/06/2023 1232   CREATININE 0.84 12/06/2023 1232   CALCIUM 9.7 12/06/2023 1232   GFRNONAA >60 10/04/2022 0355   GFRAA >60 01/25/2020 0303   Lab Results  Component Value Date   HGBA1C 5.0 12/06/2023   HGBA1C 5.2 08/24/2023   Lab Results  Component Value Date   INSULIN  7.3 12/06/2023   Lab Results  Component Value Date   TSH 1.540 12/06/2023   CBC    Component Value Date/Time   WBC 7.2 12/06/2023 1232   WBC 12.9 (H) 08/24/2023 1233   RBC 5.33 (H) 12/06/2023 1232   RBC 4.90 08/24/2023 1233   HGB 15.4 12/06/2023 1232   HCT 47.8 (H) 12/06/2023 1232   PLT 239 12/06/2023 1232   MCV 90 12/06/2023 1232   MCH 28.9 12/06/2023 1232   MCH 29.1 10/04/2022 0355   MCHC 32.2 12/06/2023 1232   MCHC 32.6 08/24/2023 1233   RDW 12.8 12/06/2023 1232   Iron Studies No results found for: "IRON", "TIBC", "FERRITIN", "IRONPCTSAT" Lipid Panel     Component Value Date/Time   CHOL 198 12/06/2023 1232   TRIG 108 12/06/2023 1232   HDL 51 12/06/2023 1232   CHOLHDL 3.9 12/06/2023 1232   CHOLHDL 3 08/24/2023 1233   VLDL 17.4 08/24/2023 1233   LDLCALC 128 (H) 12/06/2023 1232   LDLDIRECT 153.9 07/10/2013 1045   Hepatic Function Panel     Component Value Date/Time   PROT 7.3 12/06/2023 1232   ALBUMIN 4.6 12/06/2023 1232   AST 34 12/06/2023 1232   ALT 28 12/06/2023 1232   ALKPHOS 129 (H) 12/06/2023 1232   BILITOT 0.9 12/06/2023 1232   BILIDIR 0.2 01/25/2020 0303   IBILI 1.1 (H) 01/25/2020 0303      Component Value Date/Time   TSH 1.540 12/06/2023 1232   Nutritional Lab Results  Component Value Date   VD25OH 8.5 (L) 12/06/2023    Attestations:   I, Camryn Mix, acting as a Stage manager for [employer], DO., have compiled all relevant documentation for  today's office visit on behalf of Marceil Sensor, DO, while in the presence of [employer], DO.  I have reviewed the above documentation for accuracy and completeness, and I agree with the above. Linda Blackburn, D.O.  The 21st Century Cures Act was signed into law in 2016 which includes the topic of electronic health records.  This provides immediate access to information in MyChart.  This includes consultation notes, operative notes, office notes, lab results and pathology reports.  If you have any questions about what you read please let us  know at  your next visit so we can discuss your concerns and take corrective action if need be.  We are right here with you.

## 2024-01-15 DIAGNOSIS — Z08 Encounter for follow-up examination after completed treatment for malignant neoplasm: Secondary | ICD-10-CM | POA: Diagnosis not present

## 2024-01-15 DIAGNOSIS — L2989 Other pruritus: Secondary | ICD-10-CM | POA: Diagnosis not present

## 2024-01-15 DIAGNOSIS — L538 Other specified erythematous conditions: Secondary | ICD-10-CM | POA: Diagnosis not present

## 2024-01-15 DIAGNOSIS — L821 Other seborrheic keratosis: Secondary | ICD-10-CM | POA: Diagnosis not present

## 2024-01-15 DIAGNOSIS — Z789 Other specified health status: Secondary | ICD-10-CM | POA: Diagnosis not present

## 2024-01-15 DIAGNOSIS — Z85828 Personal history of other malignant neoplasm of skin: Secondary | ICD-10-CM | POA: Diagnosis not present

## 2024-01-15 DIAGNOSIS — L820 Inflamed seborrheic keratosis: Secondary | ICD-10-CM | POA: Diagnosis not present

## 2024-01-15 DIAGNOSIS — R208 Other disturbances of skin sensation: Secondary | ICD-10-CM | POA: Diagnosis not present

## 2024-01-21 DIAGNOSIS — Z961 Presence of intraocular lens: Secondary | ICD-10-CM | POA: Diagnosis not present

## 2024-01-21 DIAGNOSIS — H52203 Unspecified astigmatism, bilateral: Secondary | ICD-10-CM | POA: Diagnosis not present

## 2024-01-23 ENCOUNTER — Ambulatory Visit (INDEPENDENT_AMBULATORY_CARE_PROVIDER_SITE_OTHER): Admitting: Family Medicine

## 2024-01-23 ENCOUNTER — Encounter (INDEPENDENT_AMBULATORY_CARE_PROVIDER_SITE_OTHER): Payer: Self-pay | Admitting: Family Medicine

## 2024-01-23 VITALS — BP 135/81 | HR 70 | Temp 98.1°F | Ht 61.0 in | Wt 220.0 lb

## 2024-01-23 DIAGNOSIS — Z6841 Body Mass Index (BMI) 40.0 and over, adult: Secondary | ICD-10-CM | POA: Diagnosis not present

## 2024-01-23 DIAGNOSIS — N393 Stress incontinence (female) (male): Secondary | ICD-10-CM

## 2024-01-23 DIAGNOSIS — I10 Essential (primary) hypertension: Secondary | ICD-10-CM | POA: Diagnosis not present

## 2024-01-23 NOTE — Assessment & Plan Note (Signed)
 Blood pressure well controlled today.  She is on cozaar  daily.  No chest pain, chest pressure or headache.  Needs no change in med or dosage at this time.

## 2024-01-23 NOTE — Assessment & Plan Note (Signed)
 Discussed with patient possibility for treatment that includes pelvic floor physical therapy.  She is seeing her urologist (has bladder prolapse) in June and will discuss possibility.

## 2024-01-23 NOTE — Progress Notes (Signed)
   SUBJECTIVE:  Chief Complaint: Obesity  Interim History: Patient has had a really good experience with clinic so far.  She is feeling excited.  She has done really well following her meal plan.  She eats pretty much the same thing daily.  She is getting all food in daily.  She still struggles with her dinner- sometimes she can't eat the last few bites of dinner.  She doesn't eat much meat and she didn't previously.   Dulcy is here to discuss her progress with her obesity treatment plan. She is on the Category 1 Plan and keeping a food journal and adhering to recommended goals of [phone] calories and states she is following her eating plan approximately 99.9 % of the time. She states she is exercising 10-15 minutes 2 times per week.   OBJECTIVE: Visit Diagnoses: Problem List Items Addressed This Visit       Cardiovascular and Mediastinum   HTN (hypertension) - Primary   Blood pressure well controlled today.  She is on cozaar  daily.  No chest pain, chest pressure or headache.  Needs no change in med or dosage at this time.        Other   Stress incontinence   Discussed with patient possibility for treatment that includes pelvic floor physical therapy.  She is seeing her urologist (has bladder prolapse) in June and will discuss possibility.      Morbid obesity (HCC)   Other Visit Diagnoses       BMI 40.0-44.9, adult (HCC) -- Current BMI 42.91           Vitals Temp: 98.1 F (36.7 C) BP: 135/81 Pulse Rate: 70 SpO2: 97 %   Anthropometric Measurements Height: 5\' 1"  (1.549 m) Weight: 220 lb (99.8 kg) BMI (Calculated): 41.59 Weight at Last Visit: 227 lb Weight Lost Since Last Visit: 7 lb Weight Gained Since Last Visit: 0 Starting Weight: 233 lb Total Weight Loss (lbs): 13 lb (5.897 kg) Peak Weight: 244 lb   Body Composition  Body Fat %: 51 % Fat Mass (lbs): 112.6 lbs Muscle Mass (lbs): 102.8 lbs Total Body Water (lbs): 76.4 lbs Visceral Fat Rating : 19   Other  Clinical Data Today's Visit #: 4 Starting Date: 12/06/23 Comments: Cat 1     ASSESSMENT AND PLAN:  Diet: Yolinda is currently in the action stage of change. As such, her goal is to continue with weight loss efforts and has agreed to the Category 1 Plan.   Exercise:  Older adults should determine their level of effort for physical activity relative to their level of fitness.  Behavior Modification:  We discussed the following Behavioral Modification Strategies today: increasing lean protein intake, decreasing simple carbohydrates, increasing vegetables, meal planning and cooking strategies, keeping healthy foods in the home, and avoiding temptations.   No follow-ups on file.   She was informed of the importance of frequent follow up visits to maximize her success with intensive lifestyle modifications for her multiple health conditions.  Attestation Statements:   Reviewed by clinician on day of visit: allergies, medications, problem list, medical history, surgical history, family history, social history, and previous encounter notes.     Donaciano Frizzle, MD

## 2024-01-31 ENCOUNTER — Ambulatory Visit (INDEPENDENT_AMBULATORY_CARE_PROVIDER_SITE_OTHER): Admitting: Adult Health

## 2024-02-14 ENCOUNTER — Encounter: Payer: Self-pay | Admitting: Family Medicine

## 2024-02-14 ENCOUNTER — Ambulatory Visit: Admitting: Family Medicine

## 2024-02-14 DIAGNOSIS — Z0000 Encounter for general adult medical examination without abnormal findings: Secondary | ICD-10-CM | POA: Diagnosis not present

## 2024-02-14 NOTE — Progress Notes (Signed)
 Patient unable to obtain vital signs due to telehealth visit

## 2024-02-14 NOTE — Progress Notes (Signed)
 PATIENT CHECK-IN and HEALTH RISK ASSESSMENT QUESTIONNAIRE:  -completed by phone/video for upcoming Medicare Preventive Visit  -PLEASE SELECT NOT IN PERSON for the method of visit.   Pre-Visit Check-in: 1)Vitals (height, wt, BP, etc) - record in vitals section for visit on day of visit Request home vitals (wt, BP, etc.) and enter into vitals, THEN update Vital Signs SmartPhrase below at the top of the HPI. See below.  2)Review and Update Medications, Allergies PMH, Surgeries, Social history in Epic 3)Hospitalizations in the last year with date/reason? n  4)Review and Update Care Team (patient's specialists) in Epic 5) Complete PHQ9 in Epic  6) Complete Fall Screening in Epic 7)Review all Health Maintenance Due and order under PCP if not done.  Medicare Wellness Patient Questionnaire:  Answer theses question about your habits: How often do you have a drink containing alcohol?n How many drinks containing alcohol do you have on a typical day when you are drinking?na How often do you have six or more drinks on one occasion?n Have you ever smoked?No Quit date if applicable? N/a  How many packs a day do/did you smoke? N/a Do you use smokeless tobacco?no Do you use an illicit drugs?no On average, how many days per week do you engage in moderate to strenuous exercise (like a brisk walk)? Has knee issues, seeing specialist for knee injections, but is trying to exercise, 3 days per week On average, how many minutes do you engage in exercise at this level? 10-20 minutes Are you sexually active? No Number of partners? N/a Typical breakfast: Yogurt 0 sugar, berries, eggs and toast  Typical lunch: Turkey/Ham sandwich Typical dinner: Chicken and vegetables  Typical snacks: Ice cream  Beverages: Coffee, water   Answer theses question about your everyday activities: Can you perform most household chores? Yes  Are you deaf or have significant trouble hearing?no Do you feel that you have a  problem with memory?no Do you feel safe at home? Yes  Last dentist visit? 12/25 8. Do you have any difficulty performing your everyday activities? Somewhat  Are you having any difficulty walking, taking medications on your own, and or difficulty managing daily home needs? no Do you have difficulty walking or climbing stairs? Yes  Do you have difficulty dressing or bathing? no Do you have difficulty doing errands alone such as visiting a doctor's office or shopping? no Do you currently have any difficulty preparing food and eating?no Do you currently have any difficulty using the toilet?no Do you have any difficulty managing your finances?no Do you have any difficulties with housekeeping of managing your housekeeping?no   Do you have Advanced Directives in place (Living Will, Healthcare Power or Attorney)? Yes    Last eye Exam and location? 2 weeks ago, Dr. Ilene Malling   Do you currently use prescribed or non-prescribed narcotic or opioid pain medications? Yes   Do you have a history or close family history of breast, ovarian, tubal or peritoneal cancer or a family member with BRCA (breast cancer susceptibility 1 and 2) gene mutations? no   Nurse/Assistant Credentials/time stamp: mg 12:11     ----------------------------------------------------------------------------------------------------------------------------------------------------------------------------------------------------------------------  Because this visit was a virtual/telehealth visit, some criteria may be missing or patient reported. Any vitals not documented were not able to be obtained and vitals that have been documented are patient reported.    MEDICARE ANNUAL PREVENTIVE VISIT WITH PROVIDER: (Welcome to Medicare, initial annual wellness or annual wellness exam)  Virtual Visit via Video Note  I connected with Linda Blackburn  on 02/14/24 by a video enabled telemedicine application and verified  that I am speaking with the correct person using two identifiers.  Location patient: home Location provider:work or home office Persons participating in the virtual visit: patient, provider  Concerns and/or follow up today: [facility], reports has been losing weight. Has lost about 24 lbs in the last 6 months.    See HM section in Epic for other details of completed HM.    ROS: negative for report of fevers, unintentional weight loss, vision changes, vision loss, hearing loss or change, chest pain, sob, hemoptysis, melena, hematochezia, hematuria, falls, bleeding or bruising, thoughts of suicide or self harm, memory loss  Patient-completed extensive health risk assessment - reviewed and discussed with the patient: See Health Risk Assessment completed with patient prior to the visit either above or in recent phone note. This was reviewed in detailed with the patient today and appropriate recommendations, orders and referrals were placed as needed per Summary below and patient instructions.   Review of Medical History: -PMH, PSH, Family History and current specialty and care providers reviewed and updated and listed below   Patient Care Team: Marquetta Sit, MD as PCP - General Janel Medford, MD (Inactive) as Attending Physician (Gastroenterology) Alvina Axon, MD as Consulting Physician (Ophthalmology)   Past Medical History:  Diagnosis Date   Abnormal uterine bleeding    Amenorrhea    Anxiety    Anxiety    B12 deficiency    Back pain    Constipation    Depression    Gallstones    GERD (gastroesophageal reflux disease)    Hay fever    HTN (hypertension)    IBS (irritable bowel syndrome)    Osteoarthritis    Palpitations    Pancreatic disease    Pancreatitis    Shortness of breath    Swallowing difficulty    Swelling of lower extremity    Urine incontinence    UTI (urinary tract infection)    reoccuring     Past Surgical History:   Procedure Laterality Date   ABDOMINAL HYSTERECTOMY  1984   fibroids   APPENDECTOMY  1984   BILIARY DILATION  05/29/2019   Procedure: BILIARY DILATION;  Surgeon: Normie Becton., MD;  Location: [facility];  Service: Gastroenterology;;   BIOPSY  05/29/2019   Procedure: BIOPSY;  Surgeon: Normie Becton., MD;  Location: [facility];  Service: Gastroenterology;;   CATARACT EXTRACTION Right 11/22/2021   CHOLECYSTECTOMY  09/08/1999   ERCP N/A 05/29/2019   Procedure: ENDOSCOPIC RETROGRADE CHOLANGIOPANCREATOGRAPHY (ERCP);  Surgeon: Normie Becton., MD;  Location: [facility];  Service: Gastroenterology;  Laterality: N/A;   ERCP W/ SPHINCTEROTOMY AND BALLOON DILATION  08/2008   ESOPHAGOGASTRODUODENOSCOPY (EGD) WITH PROPOFOL  N/A 05/29/2019   Procedure: ESOPHAGOGASTRODUODENOSCOPY (EGD) WITH PROPOFOL ;  Surgeon: Brice Campi Albino Alu., MD;  Location: [facility];  Service: Gastroenterology;  Laterality: N/A;   OOPHORECTOMY     Only 1 was taken   POLYPECTOMY  05/29/2019   Procedure: POLYPECTOMY;  Surgeon: Mansouraty, Albino Alu., MD;  Location: [facility];  Service: Gastroenterology;;   REMOVAL OF STONES  05/29/2019   Procedure: REMOVAL OF STONES;  Surgeon: Normie Becton., MD;  Location: [facility];  Service: Gastroenterology;;   TONSILLECTOMY     TUBAL LIGATION      Social History   Socioeconomic History   Marital status: Widowed    Spouse name: Not on file   Number of children: 2  Years of education: Not on file   Highest education level: Some college, no degree  Occupational History   Occupation: retired  Tobacco Use   Smoking status: Never   Smokeless tobacco: Never  Vaping Use   Vaping status: Never Used  Substance and Sexual Activity   Alcohol use: Not Currently    Comment: occ - hardly every    Drug use: No   Sexual activity: Not Currently    Partners: Male    Birth control/protection: Surgical  Other Topics Concern   Not on file   Social History Narrative   10/14/2019: Lives alone in apartment.    Has two sons, one in [place] and one in [place], 5 grandchildren    Has considered moving closer to sons but likes [place] too much.   Neighbors good social support   Enjoys watching soap operas, walking when she feels well, but recently struggling with more frequent episodes of pancreatitis requiring hospitalization.   Keeps positive attitude, one day at a time      Social Drivers of [employer]: Low Risk  (02/14/2024)   Overall Financial Resource Strain (CARDIA)    Difficulty of Paying Living Expenses: Not very hard  Food Insecurity: No Food Insecurity (02/14/2024)   Hunger Vital Sign    Worried About Running Out of Food in the Last Year: Never true    Ran Out of Food in the Last Year: Never true  Transportation Needs: No Transportation Needs (02/14/2024)   PRAPARE - Administrator, Civil Service (Medical): No    Lack of Transportation (Non-Medical): No  Physical Activity: Inactive (02/14/2024)   Exercise Vital Sign    Days of Exercise per Week: 0 days    Minutes of Exercise per Session: 10 min  Stress: No Stress Concern Present (02/14/2024)   [employer] of Occupational Health - Occupational Stress Questionnaire    Feeling of Stress: Only a little  Social Connections: Socially Isolated (02/14/2024)   Social Connection and Isolation Panel    Frequency of Communication with Friends and Family: More than three times a week    Frequency of Social Gatherings with Friends and Family: Once a week    Attends Religious Services: Never    Database administrator or Organizations: No    Attends Banker Meetings: Never    Marital Status: Widowed  Intimate Partner Violence: Not At Risk (02/14/2024)   Humiliation, Afraid, Rape, and Kick questionnaire    Fear of Current or Ex-Partner: No    Emotionally Abused: No    Physically Abused: No    Sexually Abused: No    Family History   Problem Relation Age of Onset   Obesity Mother    Depression Mother    Alcohol abuse Mother    Prostate cancer Father    Stroke Maternal Aunt    Hiatal hernia Maternal Aunt    Prostate cancer Paternal Grandfather    Cerebral palsy Granddaughter    Breast cancer Neg Hx    BRCA 1/2 Neg Hx     Current Outpatient Medications on File Prior to Visit  Medication Sig Dispense Refill   cyanocobalamin  (VITAMIN B12) 1000 MCG tablet Take 1,000 mcg by mouth every other day.     fluconazole  (DIFLUCAN ) 100 MG tablet Take 1 tablet (100 mg total) by mouth daily as needed. 20 tablet 0   HYDROcodone -acetaminophen  (NORCO/VICODIN) 5-325 MG tablet Take 1-2 tablets by mouth every 6 (six) hours as needed  for moderate pain (pain score 4-6) or severe pain (pain score 7-10). 12 tablet 0   Ibuprofen-Acetaminophen  (ADVIL DUAL ACTION) 125-250 MG TABS Take by mouth as needed.     losartan  (COZAAR ) 100 MG tablet Take 1 tablet (100 mg total) by mouth daily. 90 tablet 3   Naproxen Sodium (ALEVE) 220 MG CAPS Take by mouth as needed.     ondansetron  (ZOFRAN -ODT) 4 MG disintegrating tablet Take 2 tablets every 8 hours as needed for nausea/vomiting 60 tablet 1   pantoprazole  (PROTONIX ) 40 MG tablet Take 1 tablet (40 mg total) by mouth daily. 90 tablet 3   sertraline  (ZOLOFT ) 100 MG tablet TAKE ONE TABLET BY MOUTH EVERY DAY 90 tablet 3   Vibegron 75 MG TABS Take 1 tablet by mouth daily at 6 (six) AM.     Vitamin D , Ergocalciferol , (DRISDOL ) 1.25 MG (50000 UNIT) CAPS capsule Take 1 capsule (50,000 Units total) by mouth every 7 (seven) days. 12 capsule 0   zolpidem  (AMBIEN ) 5 MG tablet Take 1 tablet (5 mg total) by mouth at bedtime as needed for sleep. 10 tablet 0   No current facility-administered medications on file prior to visit.    Allergies  Allergen Reactions   Other Hives and Swelling    AQUASONIC US  GEL Patient did not have a reaction in 2024 when this was used inadvertently to image the knee.   Buprenorphine  Hcl    Codeine     GI upset   Dilaudid  [Hydromorphone  Hcl] Itching   Morphine  And Codeine     Ants crawling on skin   Diclofenac  Sodium Rash    Per pt rash behind knee        Physical Exam Vitals requested from patient and listed below if patient had equipment and was able to obtain at home for this virtual visit: There were no vitals filed for this visit. Estimated body mass index is 41.57 kg/m as calculated from the following:   Height as of 01/23/24: 5' 1 (1.549 m).   Weight as of 01/23/24: 220 lb (99.8 kg).  EKG (optional): deferred due to virtual visit  GENERAL: alert, oriented, no acute distress detected, full vision exam deferred due to pandemic and/or virtual encounter   HEENT: atraumatic, conjunttiva clear, no obvious abnormalities on inspection of external nose and ears  NECK: normal movements of the head and neck  LUNGS: on inspection no signs of respiratory distress, breathing rate appears normal, no obvious gross SOB, gasping or wheezing  CV: no obvious cyanosis  MS: moves all visible extremities without noticeable abnormality  PSYCH/NEURO: pleasant and cooperative, no obvious depression or anxiety, speech and thought processing grossly intact, Cognitive function grossly intact  Flowsheet Row Clinical Support from 02/14/2024 in [facility]  PHQ-9 Total Score 0        02/14/2024   12:00 PM 08/24/2023   11:39 AM 11/30/2022    3:35 PM 08/22/2022    9:47 AM 11/28/2021    1:43 PM  Depression screen PHQ 2/9  Decreased Interest 0 0 0 0 0  Down, Depressed, Hopeless 0 0 0 0 0  PHQ - 2 Score 0 0 0 0 0  Altered sleeping 0 0 0 0   Tired, decreased energy 0 0 0 1   Change in appetite 0 0 0 0   Feeling bad or failure about yourself  0 0 0 0   Trouble concentrating 0 0 0 0   Moving slowly or fidgety/restless 0  0 0 0   Suicidal thoughts 0 0 0 0   PHQ-9 Score 0 0 0 1   Difficult doing work/chores Not difficult at all Not difficult at  all  Not difficult at all        11/28/2021    1:41 PM 10/11/2022    1:47 PM 11/30/2022    3:35 PM 02/20/2023    3:32 PM 02/14/2024   12:03 PM  Fall Risk  Falls in the past year? 1 0 0 0 0  Was there an injury with Fall? 0 0 0  0  Fall Risk Category Calculator 1 0 0  0  Fall Risk Category (Retired) Low       (RETIRED) Patient Fall Risk Level Low fall risk       Patient at Risk for Falls Due to Medication side effect No Fall Risks No Fall Risks  No Fall Risks  Fall risk Follow up Falls evaluation completed;Education provided;Falls prevention discussed  Falls evaluation completed Falls evaluation completed  Falls evaluation completed     Data saved with a previous flowsheet row definition     SUMMARY AND PLAN:  Encounter for Medicare annual wellness exam   Discussed applicable health maintenance/preventive health measures and advised and referred or ordered per patient preferences: -discussed colonoscopy and advised, she at first refuses, but now agrees to consider, she agrees to call and schedule if she changes her mind -she declined a bone density test -discussed vaccines due recs and risks -she declined the Tdap, she plans to get the covid vaccine in fall  Health Maintenance  Topic Date Due   Colonoscopy  11/03/2019   DTaP/Tdap/Td (2 - Tdap) 04/01/2020   COVID-19 Vaccine (6 - 2024-25 season) 08/13/2023   INFLUENZA VACCINE  04/04/2024   MAMMOGRAM  08/16/2024   Medicare Annual Wellness (AWV)  02/13/2025   Pneumococcal Vaccine: 50+ Years  Completed   DEXA SCAN  Completed   Hepatitis C Screening  Completed   Zoster Vaccines- Shingrix  Completed   HPV VACCINES  Aged Out   Meningococcal B Vaccine  Aged Out      Education and counseling on the following was provided based on the above review of health and a plan/checklist for the patient, along with additional information discussed, was provided for the patient in the patient instructions :    -Advised and counseled on a  healthy lifestyle  -Reviewed patient's current diet. Advised and counseled on a whole foods based healthy diet. A summary of a healthy diet was provided in the Patient Instructions. Discussed looking at ingredients and avoiding ultra processed foods.  -reviewed patient's current physical activity level and discussed exercise guidelines for adults. Discussed community resources and ideas for safe exercise at home to assist in meeting exercise guideline recommendations in a safe and healthy way. Discussed safe exercises that she could do despite knee issues.  -Advise yearly dental visits at minimum and regular eye exams   Follow up: see patient instructions     Patient Instructions  I really enjoyed getting to talk with you today! I am available on Tuesdays and Thursdays for virtual visits if you have any questions or concerns, or if I can be of any further assistance.   CHECKLIST FROM ANNUAL WELLNESS VISIT:  -Follow up (please call to schedule if not scheduled after visit):   -yearly for annual wellness visit with primary care office  Here is a list of your preventive care/health maintenance measures and the plan for each  if any are due:  PLAN For any measures below that may be due:   Please call to schedule colonoscopy: [phone]  2. Let us  know if you decide to do the bone density test.  3. Can get vaccines at the pharmacy.   Health Maintenance  Topic Date Due   Colonoscopy  11/03/2019   DTaP/Tdap/Td (2 - Tdap) 04/01/2020   COVID-19 Vaccine (6 - 2024-25 season) 08/13/2023   INFLUENZA VACCINE  04/04/2024   MAMMOGRAM  08/16/2024   Medicare Annual Wellness (AWV)  02/13/2025   Pneumococcal Vaccine: 50+ Years  Completed   DEXA SCAN  Completed   Hepatitis C Screening  Completed   Zoster Vaccines- Shingrix  Completed   HPV VACCINES  Aged Out   Meningococcal B Vaccine  Aged Out    -See a dentist at least yearly  -Get your eyes checked and then per your eye specialist's  recommendations  -Other issues addressed today:   -I have included below further information regarding a healthy whole foods based diet, physical activity guidelines for adults, stress management and opportunities for social connections. I hope you find this information useful.   -----------------------------------------------------------------------------------------------------------------------------------------------------------------------------------------------------------------------------------------------------------    NUTRITION: -eat real food: lots of colorful vegetables (half the plate) and fruits -5-7 servings of vegetables and fruits per day (fresh or steamed is best), exp. 2 servings of vegetables with lunch and dinner and 2 servings of fruit per day. Berries and greens such as kale and collards are great choices.  -consume on a regular basis:  fresh fruits, fresh veggies, fish, nuts, seeds, healthy oils (such as olive oil, avocado oil), whole grains (make sure for bread/pasta/crackers/etc., that the first ingredient on label contains the word whole), legumes. -can eat small amounts of dairy and lean meat (no larger than the palm of your hand), but avoid processed meats such as ham, bacon, lunch meat, etc. -drink water -try to avoid fast food and pre-packaged foods, processed meat, ultra processed foods/beverages (donuts, candy, etc.) -most experts advise limiting sodium to < 2300mg  per day, should limit further is any chronic conditions such as high blood pressure, heart disease, diabetes, etc. The American Heart Association advised that < 1500mg  is is ideal -try to avoid foods/beverages that contain any ingredients with names you do not recognize  -try to avoid foods/beverages  with added sugar or sweeteners/sweets  -try to avoid sweet drinks (including diet drinks): soda, juice, Gatorade, sweet tea, power drinks, diet drinks -try to avoid white rice, white bread, pasta  (unless whole grain)  EXERCISE GUIDELINES FOR ADULTS: -if you wish to increase your physical activity, do so gradually and with the approval of your doctor -STOP and seek medical care immediately if you have any chest pain, chest discomfort or trouble breathing when starting or increasing exercise  -move and stretch your body, legs, feet and arms when sitting for long periods -Physical activity guidelines for optimal health in adults: -get at least 150 minutes per week of moderate exercise (can talk, but not sing); this is about 20-30 minutes of sustained activity 5-7 days per week or two 10-15 minute episodes of sustained activity 5-7 days per week -do some muscle building/resistance training/strength training at least 2 days per week  -balance exercises 3+ days per week:   Stand somewhere where you have something sturdy to hold onto if you lose balance    1) lift up on toes, then back down, start with 5x per day and work up to 20x   2) stand and  lift one leg straight out to the side so that foot is a few inches of the floor, start with 5x each side and work up to 20x each side   3) stand on one foot, start with 5 seconds each side and work up to 20 seconds on each side  If you need ideas or help with getting more active:  -Silver sneakers https://tools.silversneakers.com  -Walk with a Doc: http://www.duncan-williams.com/  -try to include resistance (weight lifting/strength building) and balance exercises twice per week: or the following link for ideas: http://castillo-powell.com/  BuyDucts.dk  STRESS MANAGEMENT: -can try meditating, or just sitting quietly with deep breathing while intentionally relaxing all parts of your body for 5 minutes daily -if you need further help with stress, anxiety or depression please follow up with your primary doctor or contact the wonderful folks at [facility]: [phone]  SOCIAL CONNECTIONS: -options in [place] if you wish to engage in more social and exercise related activities:  -Silver sneakers https://tools.silversneakers.com  -Walk with a Doc: http://www.duncan-williams.com/  -Check out the [facility] Active Adults 50+ section on the [place] of [employer] (hiking clubs, book clubs, cards and games, chess, exercise classes, aquatic classes and much more) - see the website for details: https://www.[place].gov/departments/parks-recreation/active-adults50  -YouTube has lots of exercise videos for different ages and abilities as well  -[facility] (a variety of indoor and outdoor inperson activities for adults). (870)111-1027. [address].  -Virtual Online Classes (a variety of topics): see seniorplanet.org or call [phone]  -consider volunteering at a school, hospice center, church, senior center or elsewhere            Maurie Southern, DO

## 2024-02-14 NOTE — Patient Instructions (Addendum)
 I really enjoyed getting to talk with you today! I am available on Tuesdays and Thursdays for virtual visits if you have any questions or concerns, or if I can be of any further assistance.   CHECKLIST FROM ANNUAL WELLNESS VISIT:  -Follow up (please call to schedule if not scheduled after visit):   -yearly for annual wellness visit with primary care office  Here is a list of your preventive care/health maintenance measures and the plan for each if any are due:  PLAN For any measures below that may be due:   Please call to schedule colonoscopy: [phone]  2. Let us  know if you decide to do the bone density test.  3. Can get vaccines at the pharmacy.   Health Maintenance  Topic Date Due   Colonoscopy  11/03/2019   DTaP/Tdap/Td (2 - Tdap) 04/01/2020   COVID-19 Vaccine (6 - 2024-25 season) 08/13/2023   INFLUENZA VACCINE  04/04/2024   MAMMOGRAM  08/16/2024   Medicare Annual Wellness (AWV)  02/13/2025   Pneumococcal Vaccine: 50+ Years  Completed   DEXA SCAN  Completed   Hepatitis C Screening  Completed   Zoster Vaccines- Shingrix  Completed   HPV VACCINES  Aged Out   Meningococcal B Vaccine  Aged Out    -See a dentist at least yearly  -Get your eyes checked and then per your eye specialist's recommendations  -Other issues addressed today:   -I have included below further information regarding a healthy whole foods based diet, physical activity guidelines for adults, stress management and opportunities for social connections. I hope you find this information useful.   -----------------------------------------------------------------------------------------------------------------------------------------------------------------------------------------------------------------------------------------------------------    NUTRITION: -eat real food: lots of colorful vegetables (half the plate) and fruits -5-7 servings of vegetables and fruits per day (fresh or steamed is  best), exp. 2 servings of vegetables with lunch and dinner and 2 servings of fruit per day. Berries and greens such as kale and collards are great choices.  -consume on a regular basis:  fresh fruits, fresh veggies, fish, nuts, seeds, healthy oils (such as olive oil, avocado oil), whole grains (make sure for bread/pasta/crackers/etc., that the first ingredient on label contains the word whole), legumes. -can eat small amounts of dairy and lean meat (no larger than the palm of your hand), but avoid processed meats such as ham, bacon, lunch meat, etc. -drink water -try to avoid fast food and pre-packaged foods, processed meat, ultra processed foods/beverages (donuts, candy, etc.) -most experts advise limiting sodium to < 2300mg  per day, should limit further is any chronic conditions such as high blood pressure, heart disease, diabetes, etc. The American Heart Association advised that < 1500mg  is is ideal -try to avoid foods/beverages that contain any ingredients with names you do not recognize  -try to avoid foods/beverages  with added sugar or sweeteners/sweets  -try to avoid sweet drinks (including diet drinks): soda, juice, Gatorade, sweet tea, power drinks, diet drinks -try to avoid white rice, white bread, pasta (unless whole grain)  EXERCISE GUIDELINES FOR ADULTS: -if you wish to increase your physical activity, do so gradually and with the approval of your doctor -STOP and seek medical care immediately if you have any chest pain, chest discomfort or trouble breathing when starting or increasing exercise  -move and stretch your body, legs, feet and arms when sitting for long periods -Physical activity guidelines for optimal health in adults: -get at least 150 minutes per week of moderate exercise (can talk, but not sing); this is about 20-30 minutes  of sustained activity 5-7 days per week or two 10-15 minute episodes of sustained activity 5-7 days per week -do some muscle building/resistance  training/strength training at least 2 days per week  -balance exercises 3+ days per week:   Stand somewhere where you have something sturdy to hold onto if you lose balance    1) lift up on toes, then back down, start with 5x per day and work up to 20x   2) stand and lift one leg straight out to the side so that foot is a few inches of the floor, start with 5x each side and work up to 20x each side   3) stand on one foot, start with 5 seconds each side and work up to 20 seconds on each side  If you need ideas or help with getting more active:  -Silver sneakers https://tools.silversneakers.com  -Walk with a Doc: http://www.duncan-williams.com/  -try to include resistance (weight lifting/strength building) and balance exercises twice per week: or the following link for ideas: http://castillo-powell.com/  BuyDucts.dk  STRESS MANAGEMENT: -can try meditating, or just sitting quietly with deep breathing while intentionally relaxing all parts of your body for 5 minutes daily -if you need further help with stress, anxiety or depression please follow up with your primary doctor or contact the wonderful folks at [facility]: [phone]  SOCIAL CONNECTIONS: -options in [place] if you wish to engage in more social and exercise related activities:  -Silver sneakers https://tools.silversneakers.com  -Walk with a Doc: http://www.duncan-williams.com/  -Check out the [facility] Active Adults 50+ section on the [place] [employer] (hiking clubs, book clubs, cards and games, chess, exercise classes, aquatic classes and much more) - see the website for details: https://www.[place].gov/departments/parks-recreation/active-adults50  -YouTube has lots of exercise videos for different ages and abilities as well  -[facility] (a variety of indoor and outdoor inperson activities for  adults). [phone]. [address]  -Virtual Online Classes (a variety of topics): see seniorplanet.org or call [phone]  -consider volunteering at a school, hospice center, church, senior center or elsewhere

## 2024-02-15 ENCOUNTER — Telehealth: Payer: Self-pay

## 2024-02-15 DIAGNOSIS — Z1211 Encounter for screening for malignant neoplasm of colon: Secondary | ICD-10-CM

## 2024-02-15 NOTE — Telephone Encounter (Signed)
 Copied from CRM [id number]. Topic: General - Other >> Feb 15, 2024 10:47 AM Howard Macho wrote: Reason for CRM: patient called stating her colonoscopy doctor retired in December and she want to be referred to another colonoscopy doctor CB [phone]

## 2024-02-15 NOTE — Addendum Note (Signed)
 Addended by: Aurelio Leer on: 02/15/2024 01:14 PM   Modules accepted: Orders

## 2024-02-15 NOTE — Telephone Encounter (Signed)
 Referral placed and patient aware

## 2024-02-20 ENCOUNTER — Encounter (INDEPENDENT_AMBULATORY_CARE_PROVIDER_SITE_OTHER): Payer: Self-pay | Admitting: Family Medicine

## 2024-02-20 ENCOUNTER — Ambulatory Visit (INDEPENDENT_AMBULATORY_CARE_PROVIDER_SITE_OTHER): Admitting: Family Medicine

## 2024-02-20 VITALS — BP 108/71 | HR 72 | Temp 97.8°F | Ht 61.0 in | Wt 211.0 lb

## 2024-02-20 DIAGNOSIS — M810 Age-related osteoporosis without current pathological fracture: Secondary | ICD-10-CM | POA: Diagnosis not present

## 2024-02-20 DIAGNOSIS — E559 Vitamin D deficiency, unspecified: Secondary | ICD-10-CM | POA: Diagnosis not present

## 2024-02-20 DIAGNOSIS — E88819 Insulin resistance, unspecified: Secondary | ICD-10-CM | POA: Diagnosis not present

## 2024-02-20 DIAGNOSIS — Z6839 Body mass index (BMI) 39.0-39.9, adult: Secondary | ICD-10-CM | POA: Diagnosis not present

## 2024-02-20 MED ORDER — OYSTER SHELL CALCIUM/D3 500-5 MG-MCG PO TABS
ORAL_TABLET | ORAL | Status: DC
Start: 2024-02-20 — End: 2024-06-11

## 2024-02-20 NOTE — Progress Notes (Incomplete)
 Linda Blackburn, D.O.  ABFM, ABOM Specializing in Clinical Bariatric Medicine  Office located at: [address] [place], [place]  [address]   Assessment and Plan:  No orders of the defined types were placed in this encounter.   There are no discontinued medications.   Meds ordered this encounter  Medications   calcium-vitamin D  (OSCAL WITH D) 500-5 MG-MCG tablet    Sig: 1200mg  Ca daily      FOR THE DISEASE OF OBESITY: BMI 39.0-39.9,adult -- Current BMI 39.89 Morbid obesity (HCC) - start BMI 44.05 Assessment & Plan: Since last office visit with Dr. Pink Bridges on 01/23/24 patient's muscle mass has decreased by 0.2 lbs. Fat mass has decreased by 9.4 lbs. Total body water has decreased by 0.4 lbs.  Counseling done on how various foods will affect these numbers and how to maximize success  Total lbs lost to date: 22 lbs Total weight loss percentage to date: -9.44%    Recommended Dietary Goals Lillyian is currently in the action stage of change. As such, her goal is to continue weight management plan.  She has agreed to: continue current plan    Behavioral Intervention We discussed the following today: increasing lean protein intake to established goals, increasing water intake , keeping healthy foods at home, and continue to practice mindfulness when eating  Additional resources provided today: None  Evidence-based interventions for health behavior change were utilized today including the discussion of self monitoring techniques, problem-solving barriers and SMART goal setting techniques.   Regarding patient's less desirable eating habits and patterns, we employed the technique of small changes.   Pt will specifically work on: n/a   Recommended Physical Activity Goals Miana has been advised to work up to 300-450 minutes of moderate intensity aerobic activity a week and strengthening exercises 2-3 times per week for cardiovascular health, weight loss maintenance and  preservation of muscle mass.   She has agreed to: Increase physical activity in their day and reduce sedentary time (increase NEAT). and continue to gradually increase the amount and intensity of exercise routine   Pharmacotherapy We both agreed to: Continue with current nutritional and behavioral strategies   ASSOCIATED CONDITIONS ADDRESSED TODAY: Insulin  resistance Assessment & Plan: Lab Results  Component Value Date   HGBA1C 5.0 12/06/2023   HGBA1C 5.2 08/24/2023   INSULIN  7.3 12/06/2023    No meds currently; diet/exercise approach. Hunger and cravings are well controlled. No acute concerns. Continue with efforts to follow her meal plan and exercise. Increase water intake to at least half her body weight in ounces of water per day.    Vitamin D  deficiency Assessment & Plan: Lab Results  Component Value Date   VD25OH 8.5 (L) 12/06/2023   Compliant with ERGO 50K units once weekly. Tolerating well with no SE. Pt overdue for bone density, last done on 09/29/2020. Pt was provided with phone number of where to schedule her bone density.     Vitamin D  - had a medicare visit - doc recommended she decrease to lower dose rather than stopping altogether when  - As she sheds fat, vit d is expected to improve    Ideal vitamin D  levels of 50-70. Discussed benefits of optimal vit D levels including improvements with bone health, energy levels, moods, and immunity.   Will recheck vit D 3 months since pt started supplementation; around 7/17. Will consider adjusting her supplementation dose based on vit D levels results.  *** Also recommend starting 1200 mg Ca OTC  daily     Orders: - Start Calcium supplementation   ***   Age related osteoporosis, unspecified pathological fracture presence Assessment & Plan: ***      Follow up:   Return in about 20 days (around 03/11/2024) for Keep upcoming appt on 7/8 and make more appts if pt desires. . She was informed of the importance of  frequent follow up visits to maximize her success with intensive lifestyle modifications for her multiple health conditions.  Subjective:   Chief complaint: Obesity Linda Blackburn is here to discuss her progress with her obesity treatment plan. She is on the Category 1 Plan and states she is following her eating plan approximately 98% of the time. She states she is walking on the treadmill 30 minutes 3 days per week.   Interval History:  Linda Blackburn is here for a follow up office visit. Since last OV with Dr. Pink Bridges on 01/23/24, she has lost 9 lbs. She has been eating on plan, even when going out for celebratory eating. She recalls going to a pizza shop when traveling with family and she ordered a salad with balsamic vinegar. She has been working on increasing her overall water intake, but is still not drinking sufficient amounts of water throughout the day.   Pharmacotherapy for weight loss: She is currently taking no anti-obesity medication.   Review of Systems:  Pertinent positives were addressed with patient today.  Reviewed by clinician on day of visit: allergies, medications, problem list, medical history, surgical history, family history, social history, and previous encounter notes.  Weight Summary and Biometrics   Weight Lost Since Last Visit: 9lb  Weight Gained Since Last Visit: 0lb    Vitals Temp: 97.8 F (36.6 C) BP: 108/71 Pulse Rate: 72 SpO2: 97 %   Anthropometric Measurements Height: 5' 1 (1.549 m) Weight: 211 lb (95.7 kg) BMI (Calculated): 39.89 Weight at Last Visit: 220lb Weight Lost Since Last Visit: 9lb Weight Gained Since Last Visit: 0lb Starting Weight: 233lb Total Weight Loss (lbs): 22 lb (9.979 kg) Peak Weight: 244lb Waist Measurement : 49 inches   Body Composition  Body Fat %: 48.9 % Fat Mass (lbs): 103.2 lbs Muscle Mass (lbs): 102.6 lbs Total Body Water (lbs): 76 lbs Visceral Fat Rating : 17   Other Clinical Data Fasting: No Labs:  No Today's Visit #: 5 Starting Date: 12/06/23 Comments: Cat 1    Objective:   PHYSICAL EXAM: Blood pressure 108/71, pulse 72, temperature 97.8 F (36.6 C), height 5' 1 (1.549 m), weight 211 lb (95.7 kg), SpO2 97%. Body mass index is 39.87 kg/m.  General: she is overweight, cooperative and in no acute distress. PSYCH: Has normal mood, affect and thought process.   HEENT: EOMI, sclerae are anicteric. Lungs: Normal breathing effort, no conversational dyspnea. Extremities: Moves * 4 Neurologic: A and O * 3, good insight  DIAGNOSTIC DATA REVIEWED: BMET    Component Value Date/Time   NA 140 12/06/2023 1232   K 4.0 12/06/2023 1232   CL 100 12/06/2023 1232   CO2 22 12/06/2023 1232   GLUCOSE 79 12/06/2023 1232   GLUCOSE 128 (H) 08/24/2023 1233   BUN 13 12/06/2023 1232   CREATININE 0.84 12/06/2023 1232   CALCIUM 9.7 12/06/2023 1232   GFRNONAA >60 10/04/2022 0355   GFRAA >60 01/25/2020 0303   Lab Results  Component Value Date   HGBA1C 5.0 12/06/2023   HGBA1C 5.2 08/24/2023   Lab Results  Component Value Date   INSULIN  7.3 12/06/2023  Lab Results  Component Value Date   TSH 1.540 12/06/2023   CBC    Component Value Date/Time   WBC 7.2 12/06/2023 1232   WBC 12.9 (H) 08/24/2023 1233   RBC 5.33 (H) 12/06/2023 1232   RBC 4.90 08/24/2023 1233   HGB 15.4 12/06/2023 1232   HCT 47.8 (H) 12/06/2023 1232   PLT 239 12/06/2023 1232   MCV 90 12/06/2023 1232   MCH 28.9 12/06/2023 1232   MCH 29.1 10/04/2022 0355   MCHC 32.2 12/06/2023 1232   MCHC 32.6 08/24/2023 1233   RDW 12.8 12/06/2023 1232   Iron Studies No results found for: IRON, TIBC, FERRITIN, IRONPCTSAT Lipid Panel     Component Value Date/Time   CHOL 198 12/06/2023 1232   TRIG 108 12/06/2023 1232   HDL 51 12/06/2023 1232   CHOLHDL 3.9 12/06/2023 1232   CHOLHDL 3 08/24/2023 1233   VLDL 17.4 08/24/2023 1233   LDLCALC 128 (H) 12/06/2023 1232   LDLDIRECT 153.9 07/10/2013 1045   Hepatic Function  Panel     Component Value Date/Time   PROT 7.3 12/06/2023 1232   ALBUMIN 4.6 12/06/2023 1232   AST 34 12/06/2023 1232   ALT 28 12/06/2023 1232   ALKPHOS 129 (H) 12/06/2023 1232   BILITOT 0.9 12/06/2023 1232   BILIDIR 0.2 01/25/2020 0303   IBILI 1.1 (H) 01/25/2020 0303      Component Value Date/Time   TSH 1.540 12/06/2023 1232   Nutritional Lab Results  Component Value Date   VD25OH 8.5 (L) 12/06/2023    Attestations:   Cherryl Corona, acting as a medical scribe for Marceil Sensor, DO., have compiled all relevant documentation for today's office visit on behalf of Marceil Sensor, DO, while in the presence of [employer], DO.  Reviewed by clinician on day of visit: allergies, medications, problem list, medical history, surgical history, family history, social history, and previous encounter notes pertinent to patient's obesity diagnosis.  I have reviewed the above documentation for accuracy and completeness, and I agree with the above. Linda Blackburn, D.O.  The 21st Century Cures Act was signed into law in 2016 which includes the topic of electronic health records.  This provides immediate access to information in MyChart.  This includes consultation notes, operative notes, office notes, lab results and pathology reports.  If you have any questions about what you read please let us  know at your next visit so we can discuss your concerns and take corrective action if need be.  We are right here with you.

## 2024-02-21 DIAGNOSIS — Z08 Encounter for follow-up examination after completed treatment for malignant neoplasm: Secondary | ICD-10-CM | POA: Diagnosis not present

## 2024-02-21 DIAGNOSIS — L2989 Other pruritus: Secondary | ICD-10-CM | POA: Diagnosis not present

## 2024-02-21 DIAGNOSIS — L820 Inflamed seborrheic keratosis: Secondary | ICD-10-CM | POA: Diagnosis not present

## 2024-02-21 DIAGNOSIS — Z789 Other specified health status: Secondary | ICD-10-CM | POA: Diagnosis not present

## 2024-02-21 DIAGNOSIS — D1801 Hemangioma of skin and subcutaneous tissue: Secondary | ICD-10-CM | POA: Diagnosis not present

## 2024-02-21 DIAGNOSIS — L821 Other seborrheic keratosis: Secondary | ICD-10-CM | POA: Diagnosis not present

## 2024-02-21 DIAGNOSIS — R208 Other disturbances of skin sensation: Secondary | ICD-10-CM | POA: Diagnosis not present

## 2024-02-21 DIAGNOSIS — L538 Other specified erythematous conditions: Secondary | ICD-10-CM | POA: Diagnosis not present

## 2024-02-21 DIAGNOSIS — Z85828 Personal history of other malignant neoplasm of skin: Secondary | ICD-10-CM | POA: Diagnosis not present

## 2024-02-22 ENCOUNTER — Telehealth: Payer: Self-pay

## 2024-02-22 DIAGNOSIS — M810 Age-related osteoporosis without current pathological fracture: Secondary | ICD-10-CM

## 2024-02-22 NOTE — Telephone Encounter (Signed)
 Order placed and patient is aware of message below

## 2024-02-22 NOTE — Addendum Note (Signed)
 Addended by: Aurelio Leer on: 02/22/2024 03:50 PM   Modules accepted: Orders

## 2024-02-22 NOTE — Telephone Encounter (Signed)
 Copied from CRM [id number]. Topic: Referral - Question >> Feb 22, 2024 12:29 PM Aisha D wrote: Reason for CRM: Pt stated that she is overdue for a bone density test and is wanting to get a referral to have that completed. Pt stated that she is supposed to have it done every 2 years and its been 3 years. Pt would also like to check the status of the colonoscopy referral, pt stated that she hasn't heard anything back regarding an appt.

## 2024-02-26 ENCOUNTER — Telehealth: Payer: Self-pay

## 2024-02-26 NOTE — Telephone Encounter (Signed)
 Copied from CRM [id number]. Topic: Clinical - Medical Advice >> Feb 26, 2024 11:43 AM Turkey A wrote: Reason for CRM: Patient was calling Tammy back-Agent called but Tammy was at lunch please call pt back

## 2024-02-26 NOTE — Telephone Encounter (Signed)
 LVM for pt to call office to schedule a Bone Density appt.

## 2024-02-26 NOTE — Telephone Encounter (Signed)
 LVM for pt to return call to the office.

## 2024-02-28 DIAGNOSIS — R350 Frequency of micturition: Secondary | ICD-10-CM | POA: Diagnosis not present

## 2024-02-28 DIAGNOSIS — N3946 Mixed incontinence: Secondary | ICD-10-CM | POA: Diagnosis not present

## 2024-03-04 ENCOUNTER — Ambulatory Visit: Payer: Self-pay | Admitting: Family Medicine

## 2024-03-04 ENCOUNTER — Ambulatory Visit (INDEPENDENT_AMBULATORY_CARE_PROVIDER_SITE_OTHER)
Admission: RE | Admit: 2024-03-04 | Discharge: 2024-03-04 | Disposition: A | Discharge status: Home / Self Care | Source: Ambulatory Visit | Attending: Family Medicine | Admitting: Family Medicine

## 2024-03-04 DIAGNOSIS — M810 Age-related osteoporosis without current pathological fracture: Secondary | ICD-10-CM

## 2024-03-11 ENCOUNTER — Ambulatory Visit (INDEPENDENT_AMBULATORY_CARE_PROVIDER_SITE_OTHER): Admitting: Family Medicine

## 2024-03-11 ENCOUNTER — Encounter (INDEPENDENT_AMBULATORY_CARE_PROVIDER_SITE_OTHER): Payer: Self-pay | Admitting: Family Medicine

## 2024-03-11 VITALS — BP 103/70 | HR 78 | Temp 98.6°F | Ht 61.0 in | Wt 207.0 lb

## 2024-03-11 DIAGNOSIS — E559 Vitamin D deficiency, unspecified: Secondary | ICD-10-CM | POA: Diagnosis not present

## 2024-03-11 DIAGNOSIS — Z6839 Body mass index (BMI) 39.0-39.9, adult: Secondary | ICD-10-CM

## 2024-03-11 DIAGNOSIS — M810 Age-related osteoporosis without current pathological fracture: Secondary | ICD-10-CM

## 2024-03-11 DIAGNOSIS — E88819 Insulin resistance, unspecified: Secondary | ICD-10-CM

## 2024-03-11 NOTE — Progress Notes (Signed)
 Linda Blackburn, D.O.  ABFM, ABOM Specializing in Clinical Bariatric Medicine  Office located at: [address] [place], [place]  [address]   Assessment and Plan:   Obtain fasting labs next OV.  FOR THE DISEASE OF OBESITY:  BMI 39.0-39.9,adult -- Current BMI 39.11 Morbid obesity (HCC) - start BMI 39.8 Assessment & Plan: Since last office visit on 02/20/2024 patient's muscle mass has decreased by 3 lbs. Fat mass has decreased by 1.2 lbs. Total body water has decreased by 2.6 lbs. Body fat percentage increased by 0.4%.Counseling done on how various foods will affect these numbers and how to maximize success  Total lbs lost to date: - 26 lbs  Total weight loss percentage to date: - 11.16%    Recommended Dietary Goals Linda Blackburn is currently in the action stage of change. As such, her goal is to continue weight management plan.  She has agreed to: continue current plan; consider switching to journaling in the future.    Behavioral Intervention We discussed the following today: sources of healthy fats, staying on track while eating out, measuring fruit intake, focusing on food with a 10:1 ratio of calories: grams of protein and using GPT or another AI platform for recipe ideas- searching low calorie, low carb, high protein chicken recipes etc  Additional resources provided today: Handout on recipe packet I and Handout on Chicken Recipes from [employer].   Evidence-based interventions for health behavior change were utilized today including the discussion of self monitoring techniques, problem-solving barriers and SMART goal setting techniques.   Regarding patient's less desirable eating habits and patterns, we employed the technique of small changes.   Pt will specifically work on making foods she loves in a healthy manner.   Recommended Physical Activity Goals Linda Blackburn has been advised to work up to 300-450 minutes of moderate intensity aerobic activity a week and strengthening  exercises 2-3 times per week for cardiovascular health, weight loss maintenance and preservation of muscle mass.   She has agreed to: continue to gradually increase the amount and intensity of exercise routine   Pharmacotherapy Continue with current nutritional and behavioral strategies   ASSOCIATED CONDITIONS ADDRESSED TODAY:   Insulin  resistance Assessment & Plan: Lab Results  Component Value Date   HGBA1C 5.0 12/06/2023   HGBA1C 5.2 08/24/2023   INSULIN  7.3 12/06/2023    Diet/life-style approach. No complaints of excessive hunger and cravings. Continue working on nutrition plan to decrease simple carbohydrates, increase lean proteins and exercise to promote weight loss and prevent progression to Pre-DM. Advance exercise as able    Age related osteoporosis-  recent c/w osteopenia/ Vitamin D  deficiency Assessment & Plan: Lab Results  Component Value Date   VD25OH 8.5 (L) 12/06/2023   Currently on daily calcium -vitamin D  and ergocalciferol  50,000 units weekly. Had a bone density scan on 03/04/2024 that was consistent with osteopenia. Continue current regimen. Encouraged walking and weight lifting to improve bone strength.    Follow up:   Return 03/25/2024 @ 2:00 PM. She was informed of the importance of frequent follow up visits to maximize her success with intensive lifestyle modifications for her multiple health conditions.   Subjective:   Chief complaint: Obesity Linda Blackburn is here to discuss her progress with her obesity treatment plan. She is on the Category 1 Plan and states she is following her eating plan approximately 98% of the time. She states she is walking on the treadmill 20 minutes 3 days per week.  Interval History:  Linda Blackburn  is here for a follow up office visit. Since last OV on 02/20/2024, she is down 4 pounds. Has mainly been eating food she cooks herself, but has gone out to eat once. On discussion about recipes, she says that she has not been using  AI/Chat-GPT for ideas, but did recently buy herself an airfryer, though doesn't have many recipes for this. She does mention enjoying zucchini/squash noodles. Admits that she struggles with appetite loss with the hot weather.    Pharmacotherapy that aid with weight loss: none   Review of Systems:  Pertinent positives were addressed with patient today.  Reviewed by clinician on day of visit: allergies, medications, problem list, medical history, surgical history, family history, social history, and previous encounter notes.  Weight Summary and Biometrics   Weight Lost Since Last Visit: 4lb  Weight Gained Since Last Visit: 0lb   Vitals Temp: 98.6 F (37 C) BP: 103/70 Pulse Rate: 78 SpO2: 98 %   Anthropometric Measurements Height: 5' 1 (1.549 m) Weight: 207 lb (93.9 kg) BMI (Calculated): 39.13 Weight at Last Visit: 211lb Weight Lost Since Last Visit: 4lb Weight Gained Since Last Visit: 0lb Starting Weight: 233lb Total Weight Loss (lbs): 26 lb (11.8 kg) Peak Weight: 244lb Waist Measurement : 49 inches   Body Composition  Body Fat %: 49.3 % Fat Mass (lbs): 102 lbs Muscle Mass (lbs): 99.6 lbs Total Body Water (lbs): 73.4 lbs Visceral Fat Rating : 17   Other Clinical Data Fasting: No Labs: no Today's Visit #: 6 Starting Date: 12/06/23 Comments: Cat 1    Objective:   PHYSICAL EXAM: Blood pressure 103/70, pulse 78, temperature 98.6 F (37 C), height 5' 1 (1.549 m), weight 207 lb (93.9 kg), SpO2 98%. Body mass index is 39.11 kg/m.  General: she is overweight, cooperative and in no acute distress. PSYCH: Has normal mood, affect and thought process.   HEENT: EOMI, sclerae are anicteric. Lungs: Normal breathing effort, no conversational dyspnea. Extremities: Moves * 4 Neurologic: A and O * 3, good insight  DIAGNOSTIC DATA REVIEWED: BMET    Component Value Date/Time   NA 140 12/06/2023 1232   K 4.0 12/06/2023 1232   CL 100 12/06/2023 1232   CO2 22  12/06/2023 1232   GLUCOSE 79 12/06/2023 1232   GLUCOSE 128 (H) 08/24/2023 1233   BUN 13 12/06/2023 1232   CREATININE 0.84 12/06/2023 1232   CALCIUM  9.7 12/06/2023 1232   GFRNONAA >60 10/04/2022 0355   GFRAA >60 01/25/2020 0303   Lab Results  Component Value Date   HGBA1C 5.0 12/06/2023   HGBA1C 5.2 08/24/2023   Lab Results  Component Value Date   INSULIN  7.3 12/06/2023   Lab Results  Component Value Date   TSH 1.540 12/06/2023   CBC    Component Value Date/Time   WBC 7.2 12/06/2023 1232   WBC 12.9 (H) 08/24/2023 1233   RBC 5.33 (H) 12/06/2023 1232   RBC 4.90 08/24/2023 1233   HGB 15.4 12/06/2023 1232   HCT 47.8 (H) 12/06/2023 1232   PLT 239 12/06/2023 1232   MCV 90 12/06/2023 1232   MCH 28.9 12/06/2023 1232   MCH 29.1 10/04/2022 0355   MCHC 32.2 12/06/2023 1232   MCHC 32.6 08/24/2023 1233   RDW 12.8 12/06/2023 1232   Iron Studies No results found for: IRON, TIBC, FERRITIN, IRONPCTSAT Lipid Panel     Component Value Date/Time   CHOL 198 12/06/2023 1232   TRIG 108 12/06/2023 1232   HDL 51 12/06/2023 1232  CHOLHDL 3.9 12/06/2023 1232   CHOLHDL 3 08/24/2023 1233   VLDL 17.4 08/24/2023 1233   LDLCALC 128 (H) 12/06/2023 1232   LDLDIRECT 153.9 07/10/2013 1045   Hepatic Function Panel     Component Value Date/Time   PROT 7.3 12/06/2023 1232   ALBUMIN 4.6 12/06/2023 1232   AST 34 12/06/2023 1232   ALT 28 12/06/2023 1232   ALKPHOS 129 (H) 12/06/2023 1232   BILITOT 0.9 12/06/2023 1232   BILIDIR 0.2 01/25/2020 0303   IBILI 1.1 (H) 01/25/2020 0303      Component Value Date/Time   TSH 1.540 12/06/2023 1232   Nutritional Lab Results  Component Value Date   VD25OH 8.5 (L) 12/06/2023    Attestations:   I, Special Puri, acting as a Stage manager for Linda Jenkins, DO., have compiled all relevant documentation for today's office visit on behalf of Linda Jenkins, DO, while in the presence of [employer], DO.  I have spent 40 minutes in the  care of the patient today including 32 minutes face-to-face assessing and reviewing listed medical problems above as outlined in office visit note and providing nutritional and behavioral counseling as outlined in obesity care plan.  I have reviewed the above documentation for accuracy and completeness, and I agree with the above. Linda Linda Blackburn, D.O.  The 21st Century Cures Act was signed into law in 2016 which includes the topic of electronic health records.  This provides immediate access to information in MyChart.  This includes consultation notes, operative notes, office notes, lab results and pathology reports.  If you have any questions about what you read please let us  know at your next visit so we can discuss your concerns and take corrective action if need be.  We are right here with you.

## 2024-03-25 ENCOUNTER — Ambulatory Visit (INDEPENDENT_AMBULATORY_CARE_PROVIDER_SITE_OTHER): Admitting: Family Medicine

## 2024-03-25 ENCOUNTER — Encounter (INDEPENDENT_AMBULATORY_CARE_PROVIDER_SITE_OTHER): Payer: Self-pay | Admitting: Family Medicine

## 2024-03-25 VITALS — BP 108/71 | HR 67 | Temp 97.9°F | Ht 61.0 in | Wt 205.0 lb

## 2024-03-25 DIAGNOSIS — Z6838 Body mass index (BMI) 38.0-38.9, adult: Secondary | ICD-10-CM

## 2024-03-25 DIAGNOSIS — E538 Deficiency of other specified B group vitamins: Secondary | ICD-10-CM | POA: Diagnosis not present

## 2024-03-25 DIAGNOSIS — I10 Essential (primary) hypertension: Secondary | ICD-10-CM

## 2024-03-25 DIAGNOSIS — E559 Vitamin D deficiency, unspecified: Secondary | ICD-10-CM

## 2024-03-25 DIAGNOSIS — E88819 Insulin resistance, unspecified: Secondary | ICD-10-CM | POA: Diagnosis not present

## 2024-03-25 MED ORDER — VITAMIN D (ERGOCALCIFEROL) 1.25 MG (50000 UNIT) PO CAPS: 50000.0000 [IU] | ORAL_CAPSULE | ORAL | 0 refills | Status: DC

## 2024-03-25 NOTE — Progress Notes (Signed)
 Linda Blackburn, D.O.  ABFM, ABOM Specializing in Clinical Bariatric Medicine  Office located at: [address] [place], [place]  [address]    Assessment and Plan:   Orders Placed This Encounter  Procedures   Vitamin B12   VITAMIN D  25 Hydroxy (Vit-D Deficiency, Fractures)   Lipid panel   LDL cholesterol, direct   Basic metabolic panel with GFR    Medications Discontinued During This Encounter  Medication Reason   Vitamin D , Ergocalciferol , (DRISDOL ) 1.25 MG (50000 UNIT) CAPS capsule Reorder     Meds ordered this encounter  Medications   Vitamin D , Ergocalciferol , (DRISDOL ) 1.25 MG (50000 UNIT) CAPS capsule    Sig: Take 1 capsule (50,000 Units total) by mouth every 7 (seven) days.    Dispense:  12 capsule    Refill:  0     FOR THE DISEASE OF OBESITY:  BMI 38.0-38.9,adult current 38.73 Morbid obesity (HCC) - start BMI 39.8 Assessment & Plan: Since last office visit on 03/11/2024 patient's muscle mass has decreased by 0.6 lbs. Fat mass has decreased by 0.8 lbs. Total body water has increased by 1.4 lbs.  Counseling done on how various foods will affect these numbers and how to maximize success  Total lbs lost to date: - 28 lbs Total weight loss percentage to date: -12.02 %   Recommended Dietary Goals Linda Blackburn is currently in the action stage of change. As such, her goal is to continue weight management plan.  She has agreed to: continue current plan   Behavioral Intervention We discussed the following today: increasing lean protein intake to established goals  Additional resources provided today: Handout on benefits of exercise, Handout on resistance training exercises using free weights and Handout on Healthy Tuna Salad Recipe   Evidence-based interventions for health behavior change were utilized today including the discussion of self monitoring techniques, problem-solving barriers and SMART goal setting techniques.   Regarding patient's less desirable  eating habits and patterns, we employed the technique of small changes.   Pt will specifically work on: n/a   Recommended Physical Activity Goals Linda Blackburn has been advised to work up to 300-450 minutes of moderate intensity aerobic activity a week and strengthening exercises 2-3 times per week for cardiovascular health, weight loss maintenance and preservation of muscle mass.   She has agreed to continue walking regimen and ADD personalized resistance exercises with small hand weights 2 sets of 12-15 reps 2-3 days per week.    Pharmacotherapy Continue same regimen   ASSOCIATED CONDITIONS ADDRESSED TODAY:   Vitamin D  deficiency Assessment & Plan: Lab Results  Component Value Date   VD25OH 8.5 (L) 12/06/2023   Currently on daily calcium -vitamin D  and ergocalciferol  50,000 units weekly. No acute concerns.  - Continue supplementation and weight loss efforts.  - Recheck Vit D today.     B12 deficiency Assessment & Plan: Lab Results  Component Value Date   VITAMINB12 >2000 (H) 12/06/2023   Reports compliance with OTC B12 1,000 mcg every other day. No acute concerns.   - Maintain with B12 supplementation.  - Continue nutrient rich diet. - Recheck B12 today.     Insulin  resistance Assessment & Plan: Lab Results  Component Value Date   HGBA1C 5.0 12/06/2023   HGBA1C 5.2 08/24/2023   INSULIN  7.3 12/06/2023   Managed with diet and life-style interventions. Good control of hunger and cravings.   - Will forgo checking fasting insulin  level due to her recent bout of acute pancreatitis.  -  Continue working on nutrition plan to decrease simple carbohydrates, increase lean proteins and exercise to promote weight loss and prevent progression to Pre-DM.    Primary hypertension Assessment & Plan: Last 3 blood pressure readings in our office are as follows: BP Readings from Last 3 Encounters:  03/25/24 108/71  03/11/24 103/70  02/20/24 108/71   The 10-year ASCVD risk score  (Arnett DK, et al., 2019) is: 15.4%  Lab Results  Component Value Date   CREATININE 0.84 12/06/2023   BP is controlled on Losartan  100 mg daily. She occasionally feels dizzy but this is not a new issue for her.  - Continue antihypertensive therapy.  - Check BP 3-4 times per week.  - If BP is 100/60 or less on a regular basis, consider decreasing Losartan  to 50 mg.  - Will monitor for orthostasis while losing weight.  - Recheck lipid panel.    Follow up:   Return 04/09/2024 at 2:00 PM.  She was informed of the importance of frequent follow up visits to maximize her success with intensive lifestyle modifications for her multiple health conditions.  Linda Blackburn is aware that we will review all of her lab results at our next visit together in person.  She is aware that if anything is critical/ life threatening with the results, we will be contacting her via MyChart or by my CMA will be calling them prior to the office visit to discuss acute management.      Subjective:   Chief complaint: Obesity Linda Blackburn is here to discuss her progress with her obesity treatment plan. She is on the Category 1 Plan and states she is following her eating plan approximately 95% of the time. She states she is walking on the treadmill 20 minutes 3 days per week.   Interval History:  Linda Blackburn is here for a follow up office visit. Since LOV, she had a bout of pancreatitis for 4 days and could not eat but is doing much better now. She is back to adhering  to her meal plan. She's down 2 lbs since LOV.   Pharmacotherapy that aid with weight loss: none   Review of Systems:  Pertinent positives were addressed with patient today.  Reviewed by clinician on day of visit: allergies, medications, problem list, medical history, surgical history, family history, social history, and previous encounter notes.   Weight Summary and Biometrics   Weight Lost Since Last Visit: 2lb  Weight Gained Since  Last Visit: 0lb   Vitals Temp: 97.9 F (36.6 C) BP: 108/71 Pulse Rate: 67 SpO2: 97 %   Anthropometric Measurements Height: 5' 1 (1.549 m) Weight: 205 lb (93 kg) BMI (Calculated): 38.75 Weight at Last Visit: 207lb Weight Lost Since Last Visit: 2lb Weight Gained Since Last Visit: 0lb Starting Weight: 233lb Total Weight Loss (lbs): 28 lb (12.7 kg) Peak Weight: 244lb Waist Measurement : 49 inches   Body Composition  Body Fat %: 49.3 % Fat Mass (lbs): 101.2 lbs Muscle Mass (lbs): 99 lbs Total Body Water (lbs): 74.8 lbs Visceral Fat Rating : 17   Other Clinical Data Fasting: No Labs: no Today's Visit #: 7 Starting Date: 12/06/23 Comments: Cat 1    Objective:   PHYSICAL EXAM: Blood pressure 108/71, pulse 67, temperature 97.9 F (36.6 C), height 5' 1 (1.549 m), weight 205 lb (93 kg), SpO2 97%. Body mass index is 38.73 kg/m.  General: she is overweight, cooperative and in no acute distress. PSYCH: Has normal mood, affect and thought  process.   HEENT: EOMI, sclerae are anicteric. Lungs: Normal breathing effort, no conversational dyspnea. Extremities: Moves * 4 Neurologic: A and O * 3, good insight  DIAGNOSTIC DATA REVIEWED: BMET    Component Value Date/Time   NA 140 12/06/2023 1232   K 4.0 12/06/2023 1232   CL 100 12/06/2023 1232   CO2 22 12/06/2023 1232   GLUCOSE 79 12/06/2023 1232   GLUCOSE 128 (H) 08/24/2023 1233   BUN 13 12/06/2023 1232   CREATININE 0.84 12/06/2023 1232   CALCIUM  9.7 12/06/2023 1232   GFRNONAA >60 10/04/2022 0355   GFRAA >60 01/25/2020 0303   Lab Results  Component Value Date   HGBA1C 5.0 12/06/2023   HGBA1C 5.2 08/24/2023   Lab Results  Component Value Date   INSULIN  7.3 12/06/2023   Lab Results  Component Value Date   TSH 1.540 12/06/2023   CBC    Component Value Date/Time   WBC 7.2 12/06/2023 1232   WBC 12.9 (H) 08/24/2023 1233   RBC 5.33 (H) 12/06/2023 1232   RBC 4.90 08/24/2023 1233   HGB 15.4 12/06/2023 1232    HCT 47.8 (H) 12/06/2023 1232   PLT 239 12/06/2023 1232   MCV 90 12/06/2023 1232   MCH 28.9 12/06/2023 1232   MCH 29.1 10/04/2022 0355   MCHC 32.2 12/06/2023 1232   MCHC 32.6 08/24/2023 1233   RDW 12.8 12/06/2023 1232   Iron Studies No results found for: IRON, TIBC, FERRITIN, IRONPCTSAT Lipid Panel     Component Value Date/Time   CHOL 198 12/06/2023 1232   TRIG 108 12/06/2023 1232   HDL 51 12/06/2023 1232   CHOLHDL 3.9 12/06/2023 1232   CHOLHDL 3 08/24/2023 1233   VLDL 17.4 08/24/2023 1233   LDLCALC 128 (H) 12/06/2023 1232   LDLDIRECT 153.9 07/10/2013 1045   Hepatic Function Panel     Component Value Date/Time   PROT 7.3 12/06/2023 1232   ALBUMIN 4.6 12/06/2023 1232   AST 34 12/06/2023 1232   ALT 28 12/06/2023 1232   ALKPHOS 129 (H) 12/06/2023 1232   BILITOT 0.9 12/06/2023 1232   BILIDIR 0.2 01/25/2020 0303   IBILI 1.1 (H) 01/25/2020 0303      Component Value Date/Time   TSH 1.540 12/06/2023 1232   Nutritional Lab Results  Component Value Date   VD25OH 8.5 (L) 12/06/2023    Attestations:   I, Special Puri, acting as a Stage manager for [employer], DO., have compiled all relevant documentation for today's office visit on behalf of Linda Jenkins, DO, while in the presence of [employer], DO.  I have spent 42 minutes in the care of the patient today including 35 minutes face-to-face assessing and reviewing listed medical problems above as outlined in office visit note and providing nutritional and behavioral counseling as outlined in obesity care plan.  I have reviewed the above documentation for accuracy and completeness, and I agree with the above. Linda JINNY Blackburn, D.O.  The 21st Century Cures Act was signed into law in 2016 which includes the topic of electronic health records.  This provides immediate access to information in MyChart. This includes consultation notes, operative notes, office notes, lab results and pathology reports.  If  you have any questions about what you read please let us  know at your next visit so we can discuss your concerns and take corrective action if need be.  We are right here with you.

## 2024-03-26 LAB — BASIC METABOLIC PANEL WITH GFR
BUN/Creatinine Ratio: 27 (ref 12–28)
BUN: 22 mg/dL (ref 8–27)
CO2: 22 mmol/L (ref 20–29)
Calcium: 9.9 mg/dL (ref 8.7–10.3)
Chloride: 103 mmol/L (ref 96–106)
Creatinine, Ser: 0.83 mg/dL (ref 0.57–1.00)
Glucose: 80 mg/dL (ref 70–99)
Potassium: 4.1 mmol/L (ref 3.5–5.2)
Sodium: 141 mmol/L (ref 134–144)
eGFR: 73 mL/min/1.73 (ref >59)

## 2024-03-26 LAB — LIPID PANEL
Chol/HDL Ratio: 4.2 ratio (ref 0.0–4.4)
Cholesterol, Total: 154 mg/dL (ref 100–199)
HDL: 37 mg/dL — ABNORMAL LOW (ref >39)
LDL Chol Calc (NIH): 98 mg/dL (ref 0–99)
Triglycerides: 104 mg/dL (ref 0–149)
VLDL Cholesterol Cal: 19 mg/dL (ref 5–40)

## 2024-03-26 LAB — VITAMIN B12: Vitamin B-12: 1110 pg/mL (ref 232–1245)

## 2024-03-26 LAB — LDL CHOLESTEROL, DIRECT: LDL Direct: 97 mg/dL (ref 0–99)

## 2024-03-26 LAB — VITAMIN D 25 HYDROXY (VIT D DEFICIENCY, FRACTURES): Vit D, 25-Hydroxy: 79.8 ng/mL (ref 30.0–100.0)

## 2024-04-09 ENCOUNTER — Ambulatory Visit (INDEPENDENT_AMBULATORY_CARE_PROVIDER_SITE_OTHER): Admitting: Family Medicine

## 2024-04-09 ENCOUNTER — Encounter (INDEPENDENT_AMBULATORY_CARE_PROVIDER_SITE_OTHER): Payer: Self-pay | Admitting: Family Medicine

## 2024-04-09 VITALS — BP 128/76 | HR 68 | Temp 97.4°F | Ht 61.0 in | Wt 201.0 lb

## 2024-04-09 DIAGNOSIS — I10 Essential (primary) hypertension: Secondary | ICD-10-CM | POA: Diagnosis not present

## 2024-04-09 DIAGNOSIS — E559 Vitamin D deficiency, unspecified: Secondary | ICD-10-CM

## 2024-04-09 DIAGNOSIS — Z6838 Body mass index (BMI) 38.0-38.9, adult: Secondary | ICD-10-CM | POA: Diagnosis not present

## 2024-04-09 DIAGNOSIS — E538 Deficiency of other specified B group vitamins: Secondary | ICD-10-CM | POA: Diagnosis not present

## 2024-04-09 DIAGNOSIS — E88819 Insulin resistance, unspecified: Secondary | ICD-10-CM | POA: Diagnosis not present

## 2024-04-09 MED ORDER — VITAMIN D (ERGOCALCIFEROL) 1.25 MG (50000 UNIT) PO CAPS: ORAL_CAPSULE | ORAL | Status: DC

## 2024-04-09 NOTE — Progress Notes (Signed)
 Linda Blackburn, D.O.  ABFM, ABOM Specializing in Clinical Bariatric Medicine  Office located at: [address] [place], [place]  [address]   Assessment and Plan:   Medications Discontinued During This Encounter  Medication Reason   Vitamin D , Ergocalciferol , (DRISDOL ) 1.25 MG (50000 UNIT) CAPS capsule     Meds ordered this encounter  Medications   Vitamin D , Ergocalciferol , (DRISDOL ) 1.25 MG (50000 UNIT) CAPS capsule    Sig: 1 po q 10 days      FOR THE DISEASE OF OBESITY:  Morbid obesity (HCC) - start BMI 39.8 BMI 38.0-38.9,adult -- current BMI 38 Assessment & Plan: Since last office visit on 03/25/24 patient's muscle mass has decreased by 1.6 lbs. Fat mass has decreased by 2.2 lbs. Total body water has decreased by 1 lb. Counseling done on how various foods will affect these numbers and how to maximize success  Total lbs lost to date: 32 lbs Total weight loss percentage to date: -13.73 %   Recommended Dietary Goals Linda Blackburn is currently in the action stage of change. As such, her goal is to continue weight management plan.  She has agreed to: continue current plan   Behavioral Intervention We discussed the following today: increasing lean protein intake to established goals and increasing water intake , adjusting her wt loss expectations to be more reasonable and obtainable, counseling on seeing her weight loss journey as a lifelong process and not just a diet, working on all or nothing mindset. Stressed the importance of increasing her protein intake in an effort to promote an increase in muscle mass and a decreased in fat mass.   Additional resources provided today: None  Evidence-based interventions for health behavior change were utilized today including the discussion of self monitoring techniques, problem-solving barriers and SMART goal setting techniques.   Regarding patient's less desirable eating habits and patterns, we employed the technique of small  changes.   Pt will specifically work on: increasing her protein intake at each meal    Recommended Physical Activity Goals Linda Blackburn has been advised to work up to 300-450 minutes of moderate intensity aerobic activity a week and strengthening exercises 2-3 times per week for cardiovascular health, weight loss maintenance and preservation of muscle mass.   She has agreed to: Continue current level of physical activity .    Pharmacotherapy We both agreed to: Continue with current nutritional and behavioral strategies   ASSOCIATED CONDITIONS ADDRESSED TODAY:  Vitamin D  deficiency Assessment & Plan: Lab Results  Component Value Date   VD25OH 79.8 03/25/2024   VD25OH 8.5 (L) 12/06/2023   Reviewed labs obtained at [facility]: Vit D slightly above ideal range of 50-70. Compliant with Ergocalciferol  50K units once weekly on Mondays. Good compliance/tolerance. Endorses increased energy levels.   Ideal vit D range of 50-70 reviewed w patient. Vit D levels of 120 or more may reach toxic range. Mutually agreed to DECREASE Ergo dose to once every 10 days to avoid hypervitaminosis D. Will continue monitoring and plan to recheck levels in the future.     Primary hypertension Assessment & Plan: BP Readings from Last 3 Encounters:  04/09/24 128/76  03/25/24 108/71  03/11/24 103/70   BP is at goal. Currently on Losartan  100 mg once daily. Compliant and tolerating well. No SE or acute concerns today.   Reviewed BP goal of less than 120/80.  Continue heart-healthy diet by decreasing salt intake, decreasing simple carbs, and increasing protein intake. Increase water intake to 1/2 her body  weight in ounces of water per day. Continue current antihypertensive regimen as prescribed.     Insulin  resistance Assessment & Plan: Lab Results  Component Value Date   HGBA1C 5.0 12/06/2023   HGBA1C 5.2 08/24/2023   INSULIN  7.3 12/06/2023    Reviewed last obtained labs: A1c improved. Not currently on  pharmacologic treatment. Diet/lifestyle controlled. Pt not meeting recommended protein goal per day.   Continue following healthy diet, exercise, and other lifestyle interventions in an effort to prevent progression to prediabetes or diabetes. Increase protein intake to improve insulin  sensitivity and promote wt loss. Increase water intake to at least 1/2 her body wt in ounces of water per day. Will check A1c and insulin  in the future.     B12 deficiency Assessment & Plan: Lab Results  Component Value Date   VITAMINB12 1,110 03/25/2024   VITAMINB12 >2000 (H) 12/06/2023   VITAMINB12 244 08/22/2022   Reviewed prior labs: B12 was elevated in 12/2023. Per last obtained labs on 03/25/24, B12 did improve.  Currently on vit B12 1,000 mcg every other day. Good compliance/tolerance. Denies any fatigue today.   Continue supplementation at current dose. Will continue monitoring vit B12 levels periodically.     Follow up:   Return in about 3 weeks (around 04/30/2024) for f/u on 04/30/2024 at 2:00 PM.  She was informed of the importance of frequent follow up visits to maximize her success with intensive lifestyle modifications for her multiple health conditions.  Subjective:   Chief complaint: Obesity Linda Blackburn is here to discuss her progress with her obesity treatment plan. She is on the Category 1 Plan and states she is following her eating plan approximately 98% of the time. She states she is doing cardio and weight lifting 45 minutes 3 days per week. She states she is specifically doing the treadmill for about 20 minutes and biking for about 10 minutes in the mornings. She then does weight lifting in the evenings.    Interval History:  Linda Blackburn is here for a follow up office visit. Since last OV on 03/25/24, she is down 4 lbs. Has not been eating whole foods and is not meeting the recommended amount of protein per day. Although she is not drinking enough water per day, she has increased her  water intake daily.    Pharmacotherapy that aid with weight loss: None.   Review of Systems:  Pertinent positives were addressed with patient today.  Reviewed by clinician on day of visit: allergies, medications, problem list, medical history, surgical history, family history, social history, and previous encounter notes.  Weight Summary and Biometrics   Weight Lost Since Last Visit: 4lb  Weight Gained Since Last Visit: 0    Vitals Temp: (!) 97.4 F (36.3 C) BP: 128/76 Pulse Rate: 68 SpO2: 98 %   Anthropometric Measurements Height: 5' 1 (1.549 m) Weight: 201 lb (91.2 kg) BMI (Calculated): 38 Weight at Last Visit: 205lb Weight Lost Since Last Visit: 4lb Weight Gained Since Last Visit: 0 Starting Weight: 233lb Total Weight Loss (lbs): 32 lb (14.5 kg) Peak Weight: 244lb Waist Measurement : 49 inches   Body Composition  Body Fat %: 49.1 % Fat Mass (lbs): 99 lbs Muscle Mass (lbs): 97.4 lbs Total Body Water (lbs): 73.8 lbs Visceral Fat Rating : 17   Other Clinical Data Fasting: no Labs: no Today's Visit #: 8 Starting Date: 12/06/23    Objective:   PHYSICAL EXAM: Blood pressure 128/76, pulse 68, temperature (!) 97.4 F (36.3 C), height  5' 1 (1.549 m), weight 201 lb (91.2 kg), SpO2 98%. Body mass index is 37.98 kg/m.  General: she is overweight, cooperative and in no acute distress. PSYCH: Has normal mood, affect and thought process.   HEENT: EOMI, sclerae are anicteric. Lungs: Normal breathing effort, no conversational dyspnea. Extremities: Moves * 4 Neurologic: A and O * 3, good insight  DIAGNOSTIC DATA REVIEWED: BMET    Component Value Date/Time   NA 141 03/25/2024 1438   K 4.1 03/25/2024 1438   CL 103 03/25/2024 1438   CO2 22 03/25/2024 1438   GLUCOSE 80 03/25/2024 1438   GLUCOSE 128 (H) 08/24/2023 1233   BUN 22 03/25/2024 1438   CREATININE 0.83 03/25/2024 1438   CALCIUM  9.9 03/25/2024 1438   GFRNONAA >60 10/04/2022 0355   GFRAA >60  01/25/2020 0303   Lab Results  Component Value Date   HGBA1C 5.0 12/06/2023   HGBA1C 5.2 08/24/2023   Lab Results  Component Value Date   INSULIN  7.3 12/06/2023   Lab Results  Component Value Date   TSH 1.540 12/06/2023   CBC    Component Value Date/Time   WBC 7.2 12/06/2023 1232   WBC 12.9 (H) 08/24/2023 1233   RBC 5.33 (H) 12/06/2023 1232   RBC 4.90 08/24/2023 1233   HGB 15.4 12/06/2023 1232   HCT 47.8 (H) 12/06/2023 1232   PLT 239 12/06/2023 1232   MCV 90 12/06/2023 1232   MCH 28.9 12/06/2023 1232   MCH 29.1 10/04/2022 0355   MCHC 32.2 12/06/2023 1232   MCHC 32.6 08/24/2023 1233   RDW 12.8 12/06/2023 1232   Iron Studies No results found for: IRON, TIBC, FERRITIN, IRONPCTSAT Lipid Panel     Component Value Date/Time   CHOL 154 03/25/2024 1438   TRIG 104 03/25/2024 1438   HDL 37 (L) 03/25/2024 1438   CHOLHDL 4.2 03/25/2024 1438   CHOLHDL 3 08/24/2023 1233   VLDL 17.4 08/24/2023 1233   LDLCALC 98 03/25/2024 1438   LDLDIRECT 97 03/25/2024 1438   LDLDIRECT 153.9 07/10/2013 1045   Hepatic Function Panel     Component Value Date/Time   PROT 7.3 12/06/2023 1232   ALBUMIN 4.6 12/06/2023 1232   AST 34 12/06/2023 1232   ALT 28 12/06/2023 1232   ALKPHOS 129 (H) 12/06/2023 1232   BILITOT 0.9 12/06/2023 1232   BILIDIR 0.2 01/25/2020 0303   IBILI 1.1 (H) 01/25/2020 0303      Component Value Date/Time   TSH 1.540 12/06/2023 1232   Nutritional Lab Results  Component Value Date   VD25OH 79.8 03/25/2024   VD25OH 8.5 (L) 12/06/2023    Attestations:   LILLETTE Vernell Forest, acting as a medical scribe for Linda Jenkins, DO., have compiled all relevant documentation for today's office visit on behalf of Linda Jenkins, DO, while in the presence of [employer], DO.  I have reviewed the above documentation for accuracy and completeness, and I agree with the above. Linda JINNY Blackburn, D.O.  The 21st Century Cures Act was signed into law in 2016 which  includes the topic of electronic health records.  This provides immediate access to information in MyChart.  This includes consultation notes, operative notes, office notes, lab results and pathology reports.  If you have any questions about what you read please let us  know at your next visit so we can discuss your concerns and take corrective action if need be.  We are right here with you.

## 2024-04-30 ENCOUNTER — Ambulatory Visit (INDEPENDENT_AMBULATORY_CARE_PROVIDER_SITE_OTHER): Admitting: Family Medicine

## 2024-04-30 ENCOUNTER — Encounter (INDEPENDENT_AMBULATORY_CARE_PROVIDER_SITE_OTHER): Payer: Self-pay | Admitting: Family Medicine

## 2024-04-30 DIAGNOSIS — E559 Vitamin D deficiency, unspecified: Secondary | ICD-10-CM | POA: Diagnosis not present

## 2024-04-30 DIAGNOSIS — Z6836 Body mass index (BMI) 36.0-36.9, adult: Secondary | ICD-10-CM | POA: Diagnosis not present

## 2024-04-30 DIAGNOSIS — I10 Essential (primary) hypertension: Secondary | ICD-10-CM | POA: Diagnosis not present

## 2024-04-30 DIAGNOSIS — Z6839 Body mass index (BMI) 39.0-39.9, adult: Secondary | ICD-10-CM

## 2024-04-30 DIAGNOSIS — E88819 Insulin resistance, unspecified: Secondary | ICD-10-CM

## 2024-04-30 NOTE — Progress Notes (Signed)
 Linda Blackburn, D.O.  ABFM, ABOM Specializing in Clinical Bariatric Medicine  Office located at: [address] [place], [place]  [address]     Assessment and Plan:    FOR THE DISEASE OF OBESITY:  Morbid obesity (HCC) - start BMI 39.8 BMI 39.0-39.9,adult -- Current BMI 36.3 Assessment & Plan: Since last office visit on 04/09/24 patient's muscle mass has decreased by 3.2 lbs. Fat mass has decreased by 5.6 lbs. Total body water has decreased by 1.2 lbs.  Body fat % has decreased by 0.6 %. Counseling done on how various foods will affect these numbers and how to maximize success  Total lbs lost to date: 41 lbs Total weight loss percentage to date: -17.60 %   Recommended Dietary Goals Linda Blackburn is currently in the action stage of change. As such, her goal is to continue weight management plan.  She has agreed to: continue current plan   Behavioral Intervention We discussed the following today: increasing lean protein intake to established goals, avoiding skipping meals, reading food labels , continue to work on maintaining a reduced calorie state, getting the recommended amount of protein, incorporating whole foods, making healthy choices, staying well hydrated and practicing mindfulness when eating., and focusing on food with a 10:1 ratio of calories: grams of protein. Discussed options of low carb pastas and sauces (Carba-Nada by al dente and [place] low sodium marinara sauces). She may eat her chicken on the side if she does not like it in her pasta. Reviewed the risks of losing more than 5% of weight per month. try other sources of protein to help her fill. Healthier alternatives for condiments or sauces ([employer] sugar free BBQ sauce).   Additional resources provided today: Handout with various options of low-carb pastas ([employer]).    Evidence-based interventions for health behavior change were utilized today including the discussion of self monitoring techniques,  problem-solving barriers and SMART goal setting techniques.   Regarding patient's less desirable eating habits and patterns, we employed the technique of small changes.   Pt will specifically work on: n/a   Recommended Physical Activity Goals Linda Blackburn has been advised to work up to 300-450 minutes of moderate intensity aerobic activity a week and strengthening exercises 2-3 times per week for cardiovascular health, weight loss maintenance and preservation of muscle mass.   She has agreed to: Continue current level of physical activity  and Increase physical activity in their day and reduce sedentary time (increase NEAT).   Pharmacotherapy We both agreed to: Continue with current nutritional and behavioral strategies   ASSOCIATED CONDITIONS ADDRESSED TODAY:  Primary hypertension Assessment & Plan: BP Readings from Last 3 Encounters:  04/30/24 113/75  04/09/24 128/76  03/25/24 108/71   BP at goal today. Currently on Losartan  100 mg once daily. Good compliance/tolerance with no SE. Reports an episode of dizziness and feeling unstable. Has not monitored her BP during these episodes, although she notes she does have a cuff at home.   Advised pt monitor her BP at home, especially when feeling poorly. Reviewed hypotensive and hypertensive symptoms with pt. Discussed how BP is expected to decrease as she loses weight. If dizziness and imbalance episodes persist, I recommend she follow up with her PCP. Continue heart-healthy diet and regular exercise. Will continue monitoring alongside PCP.     Insulin  resistance Assessment & Plan: Lab Results  Component Value Date   HGBA1C 5.0 12/06/2023   HGBA1C 5.2 08/24/2023   INSULIN  7.3 12/06/2023  No meds currently; managed with diet and lifestyle interventions. Hunger/cravings are also well controlled.   Continue with prudent nutritional meal plan; increase protein, fiber, and whole foods and decrease simple carbs and added sugars. Avoid  skipped meals. Continue regular exercise. Will continue monitoring.     Vitamin D  deficiency Assessment & Plan: Lab Results  Component Value Date   VD25OH 79.8 03/25/2024   VD25OH 8.5 (L) 12/06/2023   Dose was decreased at [facility] on 04/09/24. Currently on ERGO 50K units once 10 days. Good compliance and tolerances. No adverse SE or acute concerns reported.   Continue with current supplementation regimen. No refill required today. Will continue monitoring.     Follow up:   Return in about 3 weeks (around 05/21/2024) for f/u on 05/21/2024 at 2:00 PM. She was informed of the importance of frequent follow up visits to maximize her success with intensive lifestyle modifications for her multiple health conditions.  Subjective:   Chief complaint: Obesity Linda Blackburn is here to discuss her progress with her obesity treatment plan. She is on the Category 1 Plan and states she is following her eating plan approximately 96% of the time. She states she is walking on the treadmill and stationary bike for 10-45 minutes 3 days per week.    Interval History:  Linda Blackburn is here for a follow up office visit. Since last OV on 04/09/24 , she is down 9 lbs. She reports she has been eating almost the same meals daily, stating this is what she has found works best for her. She has already measured the macros/calories for the recipes, and finds it most convenient for her. Has been tracking [id number] calories daily. For lunch, she is eating 5-6 slices of [place] breast deli meat every day and sometimes eats cottage cheese afterwards for additional protein. She usually opts for chicken breast as her protein source for dinner. For breakfast she eats 2 canadian bacon 2 days/week and 1 day the weekend with greek yogurt and blueberries. At baseline she has had a low appetite. Dislikes eating salmon, stating it is too fatty for her. She enjoys eating almonds and pistachios.    Pharmacotherapy that aid with weight loss:  She is currently taking no anti-obesity medication.    Review of Systems:  Pertinent positives were addressed with patient today.  Reviewed by clinician on day of visit: allergies, medications, problem list, medical history, surgical history, family history, social history, and previous encounter notes.  Weight Summary and Biometrics   Weight Lost Since Last Visit: 9lb  Weight Gained Since Last Visit: 0lb    Vitals Temp: 97.8 F (36.6 C) BP: 113/75 Pulse Rate: 64 SpO2: 97 %   Anthropometric Measurements Height: 5' 1 (1.549 m) Weight: 192 lb (87.1 kg) BMI (Calculated): 36.3 Weight at Last Visit: 201lb Weight Lost Since Last Visit: 9lb Weight Gained Since Last Visit: 0lb Starting Weight: 233lb Total Weight Loss (lbs): 41 lb (18.6 kg) Peak Weight: 244lb   Body Composition  Body Fat %: 48.5 % Fat Mass (lbs): 93.4 lbs Muscle Mass (lbs): 94.2 lbs Total Body Water (lbs): 72.6 lbs Visceral Fat Rating : 16   Other Clinical Data Fasting: no Labs: no Today's Visit #: 9 Starting Date: 12/06/23    Objective:   PHYSICAL EXAM: Blood pressure 113/75, pulse 64, temperature 97.8 F (36.6 C), height 5' 1 (1.549 m), weight 192 lb (87.1 kg), SpO2 97%. Body mass index is 36.28 kg/m.  General: she is overweight, cooperative and in no acute  distress. PSYCH: Has normal mood, affect and thought process.   HEENT: EOMI, sclerae are anicteric. Lungs: Normal breathing effort, no conversational dyspnea. Extremities: Moves * 4 Neurologic: A and O * 3, good insight  DIAGNOSTIC DATA REVIEWED: BMET    Component Value Date/Time   NA 141 03/25/2024 1438   K 4.1 03/25/2024 1438   CL 103 03/25/2024 1438   CO2 22 03/25/2024 1438   GLUCOSE 80 03/25/2024 1438   GLUCOSE 128 (H) 08/24/2023 1233   BUN 22 03/25/2024 1438   CREATININE 0.83 03/25/2024 1438   CALCIUM  9.9 03/25/2024 1438   GFRNONAA >60 10/04/2022 0355   GFRAA >60 01/25/2020 0303   Lab Results  Component Value Date    HGBA1C 5.0 12/06/2023   HGBA1C 5.2 08/24/2023   Lab Results  Component Value Date   INSULIN  7.3 12/06/2023   Lab Results  Component Value Date   TSH 1.540 12/06/2023   CBC    Component Value Date/Time   WBC 7.2 12/06/2023 1232   WBC 12.9 (H) 08/24/2023 1233   RBC 5.33 (H) 12/06/2023 1232   RBC 4.90 08/24/2023 1233   HGB 15.4 12/06/2023 1232   HCT 47.8 (H) 12/06/2023 1232   PLT 239 12/06/2023 1232   MCV 90 12/06/2023 1232   MCH 28.9 12/06/2023 1232   MCH 29.1 10/04/2022 0355   MCHC 32.2 12/06/2023 1232   MCHC 32.6 08/24/2023 1233   RDW 12.8 12/06/2023 1232   Iron Studies No results found for: IRON, TIBC, FERRITIN, IRONPCTSAT Lipid Panel     Component Value Date/Time   CHOL 154 03/25/2024 1438   TRIG 104 03/25/2024 1438   HDL 37 (L) 03/25/2024 1438   CHOLHDL 4.2 03/25/2024 1438   CHOLHDL 3 08/24/2023 1233   VLDL 17.4 08/24/2023 1233   LDLCALC 98 03/25/2024 1438   LDLDIRECT 97 03/25/2024 1438   LDLDIRECT 153.9 07/10/2013 1045   Hepatic Function Panel     Component Value Date/Time   PROT 7.3 12/06/2023 1232   ALBUMIN 4.6 12/06/2023 1232   AST 34 12/06/2023 1232   ALT 28 12/06/2023 1232   ALKPHOS 129 (H) 12/06/2023 1232   BILITOT 0.9 12/06/2023 1232   BILIDIR 0.2 01/25/2020 0303   IBILI 1.1 (H) 01/25/2020 0303      Component Value Date/Time   TSH 1.540 12/06/2023 1232   Nutritional Lab Results  Component Value Date   VD25OH 79.8 03/25/2024   VD25OH 8.5 (L) 12/06/2023    Attestations:   LILLETTE Vernell Forest, acting as a medical scribe for Linda Jenkins, DO., have compiled all relevant documentation for today's office visit on behalf of Linda Jenkins, DO, while in the presence of [employer], DO.  I have reviewed the above documentation for accuracy and completeness, and I agree with the above. Linda JINNY Blackburn, D.O.  The 21st Century Cures Act was signed into law in 2016 which includes the topic of electronic health records.  This  provides immediate access to information in MyChart.  This includes consultation notes, operative notes, office notes, lab results and pathology reports.  If you have any questions about what you read please let us  know at your next visit so we can discuss your concerns and take corrective action if need be.  We are right here with you.

## 2024-05-08 ENCOUNTER — Telehealth: Payer: Self-pay | Admitting: Family Medicine

## 2024-05-08 NOTE — Telephone Encounter (Signed)
 Can re-run end of October 07/01/24 will postpone this message to alert me to re run at that time. Thank you

## 2024-05-08 NOTE — Telephone Encounter (Signed)
 Pt completed gel/bilateral knees in April, due in October.  Calling to be sure we run benefits and schedule appropriately timed.   Did Orthovisc and has been happy with it, unless Dr. Joane recommends something else.

## 2024-05-08 NOTE — Telephone Encounter (Signed)
 Pt notified via phone, would like to start series as soon as appropriate as she is travelling for Thanksgiving and wants to feel her best.

## 2024-05-21 ENCOUNTER — Ambulatory Visit (INDEPENDENT_AMBULATORY_CARE_PROVIDER_SITE_OTHER): Admitting: Family Medicine

## 2024-05-21 ENCOUNTER — Encounter (INDEPENDENT_AMBULATORY_CARE_PROVIDER_SITE_OTHER): Payer: Self-pay | Admitting: Family Medicine

## 2024-05-21 DIAGNOSIS — E559 Vitamin D deficiency, unspecified: Secondary | ICD-10-CM

## 2024-05-21 DIAGNOSIS — E88819 Insulin resistance, unspecified: Secondary | ICD-10-CM

## 2024-05-21 DIAGNOSIS — I10 Essential (primary) hypertension: Secondary | ICD-10-CM

## 2024-05-21 DIAGNOSIS — K861 Other chronic pancreatitis: Secondary | ICD-10-CM

## 2024-05-21 DIAGNOSIS — Z6839 Body mass index (BMI) 39.0-39.9, adult: Secondary | ICD-10-CM

## 2024-05-21 DIAGNOSIS — Z6836 Body mass index (BMI) 36.0-36.9, adult: Secondary | ICD-10-CM

## 2024-05-21 MED ORDER — METFORMIN HCL 500 MG PO TABS: ORAL_TABLET | ORAL | 0 refills | Status: DC

## 2024-05-21 NOTE — Progress Notes (Signed)
 Linda Blackburn, D.O.  ABFM, ABOM Specializing in Clinical Bariatric Medicine  Office located at: [address] [place], [place]  [address]   Assessment and Plan:   Meds ordered this encounter  Medications   metFORMIN  (GLUCOPHAGE ) 500 MG tablet    Sig: 1/2 po with lunch daily    Dispense:  30 tablet    Refill:  0    30 d supply;  ** OV for RF **   Do not send RF request    FOR THE DISEASE OF OBESITY: Morbid obesity (HCC) - start BMI 39.8; BMI 39.0-39.9,adult -- Current BMI 36.11 Assessment & Plan: Since last office visit on 04/30/2024 patient's muscle mass has increased by 0.2 lbs. Fat mass has decreased by 1 lbs. Total body water has decreased by 0.2 lbs.  Body fat % has decreased by 0.3%. Counseling done on how various foods will affect these numbers and how to maximize success.  Total lbs lost to date: 42 lbs  Total weight loss percentage to date: 17.60 %  - Tracking Calories/Macros: yes  - Eating More Whole Foods: no  - Adequate Protein Intake: no  - Adequate Water Intake: no  - Skipping Meals: yes  - Sleeping 7-9 Hours/ Night: yes   Recommended Dietary Goals Linda Blackburn is currently in the action stage of change. As such, her goal is to continue weight management plan.  She has agreed to: continue Category 1 Plan, incorporate journaling food intake with a goal of [phone] calories daily  Behavioral Intervention We discussed the following today: going to the skinnytaste website for recipe ideas, using GPT or another AI platform for recipe ideas- searching low calorie, low carb, high protein chicken recipes etc, and discussed pre-packaged healthier meals such as [employer] All Natural, Whole 30 chicken meals, Longlife meal prep and Factor Meals etc  Additional resources provided today: Handout on risks/ benefits of metformin  and associated Myths of use and Handout on benefits of metformin  for weight loss  Evidence-based interventions for health behavior change  were utilized today including the discussion of self monitoring techniques, problem-solving barriers and SMART goal setting techniques.   Regarding patient's less desirable eating habits and patterns, we employed the technique of small changes.   Goal(s) for next OV: look up recipe ideas on Skinn[employer].com, using AI searching low calorie, low carb, high protein chicken recipes etc, or pre-packaged healthier meals.  Recommended Physical Activity Goals Linda Blackburn has been advised to work up to 300-450 minutes of moderate intensity aerobic activity a week and strengthening exercises 2-3 times per week for cardiovascular health, weight loss maintenance and preservation of muscle mass.   She has agreed to: Think about enjoyable ways to increase daily physical activity and overcoming barriers to exercise, Increase physical activity in their day and reduce sedentary time (increase NEAT)., and Start strengthening exercises with a goal of 2-3 sessions a week    Pharmacotherapy We both agreed to: Was educated on available treatment options, including pharmacotherapy with emphasis on potential side effects and other available medical interventions.START Metformin , uptitrating from 250 mg to 500 mg daily.    ASSOCIATED CONDITIONS ADDRESSED TODAY:  Vitamin D  deficiency Assessment & Plan: Goal is 50-70, and in July her level was beyond that goal but still within normal limits. She takes a combined Calcium /Vitamin-D 1200 mg Ca q3 days and also ERGO 50,000 units q10 days.   I discussed the importance of vitamin D  to the patient's health and well-being as well as to their ability  to lose weight, reviewing ideal vitamin D  levels. Informed patient this may be a lifelong thing, and she was encouraged to continue to take the medicine until told otherwise.    We will need to monitor levels regularly (every 3-4 mo on average) to keep levels within normal limits and prevent over supplementation. Pt denies for refill  today.  Lab Results  Component Value Date   VD25OH 79.8 03/25/2024   VD25OH 8.5 (L) 12/06/2023    Insulin  resistance Assessment & Plan: Goal of HgbA1c is <5.7%, Fasting Insulin  =/<5. In April, HgbA1c was within normal limits, however FI was almost 2.5x the goal. She does endorse having sugar cravings, but has resisted temptations most of the time within the past 6 months.  I counseled patient on pathophysiology of the disease process of I.R, stressing importance of dietary and lifestyle modifications to result in weight loss as first line txmnt. We discussed the risks and benefits of various medication options which can help us  in the management of this disease process as well as with weight loss. START Metformin , uptitrating from 250 mg to 500 mg daily. Continue to decrease simple carbs/ sugars; increase fiber and proteins -> Linda Blackburn will continue to work on weight loss, exercise, via their meal plan we devised to help decrease the risk of progressing to diabetes.  We will recheck A1c and fasting insulin  level in approximately 3 months from last check, or as deemed appropriate.  Lab Results  Component Value Date   HGBA1C 5.0 12/06/2023   HGBA1C 5.2 08/24/2023   INSULIN  7.3 12/06/2023     Primary hypertension Assessment & Plan: Treated with Losartan  100 mg daily. Blood Pressure: normotensive today. Creatinine was WNL in July.   Linda Blackburn's BP is at goal today. Reviewed ASCVD risk score with patient and what it correlates with.   Lifestyle changes such as following our low salt, heart healthy meal plan and engaging in a regular exercise program discussed; recommended avoiding foods that are: processed, frozen, or prepackaged to avoid excess salt. Ambulatory blood pressure monitoring encouraged.  Reminded patient that if they ever feel poorly in any way, to check their blood pressure and pulse as well. We will continue to monitor closely alongside PCP/ specialists. Continue  medication regimen as prescribed by PCP/specialists. Pt reminded to also f/up with those individuals as instructed by them.  BP Readings from Last 3 Encounters:  05/21/24 128/76  04/30/24 113/75  04/09/24 128/76   The 10-year ASCVD risk score (Arnett DK, et al., 2019) is: 20.5%   Lab Results  Component Value Date   CREATININE 0.83 03/25/2024    Follow up:   Return 06/11/2024 arriving @ 1:45 PM, and then on 07/09/2024 arriving @ 1:45 PM. She was informed of the importance of frequent follow up visits to maximize her success with intensive lifestyle modifications for her multiple health conditions.  Subjective:    Chief complaint: Obesity Linda Blackburn is here to discuss her progress with her obesity treatment plan. She is on the Category 1 Plan and states she is following her eating plan approximately 95% of the time. Pt is not exercising due to knee pain.    Interval History:  Linda Blackburn is here for a follow up office visit. Pt has experienced a weight loss of 1 LB since last OV on 04/30/2024. She states that she has not been exercising due to her knee pain. She did receive Orthovisc 30 mg injected into knee joint on 12/31/23 by Dr. Joane Sar  and reportedly can not receive another until October. She does endorse missing bad foods such as sweets or fried foods, but says that she has only bought sweets that she ate within a short amount of time twice within the past 6 months.    Pharmacotherapy that aid with weight loss: She is currently taking no anti-obesity medication.    Review of Systems:  Pertinent positives were addressed with patient today.  Reviewed by clinician on day of visit: allergies, medications, problem list, medical history, surgical history, family history, social history, and previous encounter notes.  Weight Summary and Biometrics   Weight Lost Since Last Visit: 1lb  Weight Gained Since Last Visit: 0   No data recorded Anthropometric  Measurements Height: 5' 1 (1.549 m) Weight at Last Visit: 192lb Starting Weight: 233lb Peak Weight: 244lb   No data recorded Other Clinical Data Fasting: no Labs: no Today's Visit #: 10 Starting Date: 12/06/23    Objective:   PHYSICAL EXAM: Height 5' 1 (1.549 m). Body mass index is 36.28 kg/m.  General: she is overweight, cooperative and in no acute distress. PSYCH: Has normal mood, affect and thought process.   HEENT: EOMI, sclerae are anicteric. Lungs: Normal breathing effort, no conversational dyspnea. Extremities: Moves * 4 Neurologic: A and O * 3, good insight  DIAGNOSTIC DATA REVIEWED: BMET    Component Value Date/Time   NA 141 03/25/2024 1438   K 4.1 03/25/2024 1438   CL 103 03/25/2024 1438   CO2 22 03/25/2024 1438   GLUCOSE 80 03/25/2024 1438   GLUCOSE 128 (H) 08/24/2023 1233   BUN 22 03/25/2024 1438   CREATININE 0.83 03/25/2024 1438   CALCIUM  9.9 03/25/2024 1438   GFRNONAA >60 10/04/2022 0355   GFRAA >60 01/25/2020 0303   Lab Results  Component Value Date   HGBA1C 5.0 12/06/2023   HGBA1C 5.2 08/24/2023   Lab Results  Component Value Date   INSULIN  7.3 12/06/2023   Lab Results  Component Value Date   TSH 1.540 12/06/2023   CBC    Component Value Date/Time   WBC 7.2 12/06/2023 1232   WBC 12.9 (H) 08/24/2023 1233   RBC 5.33 (H) 12/06/2023 1232   RBC 4.90 08/24/2023 1233   HGB 15.4 12/06/2023 1232   HCT 47.8 (H) 12/06/2023 1232   PLT 239 12/06/2023 1232   MCV 90 12/06/2023 1232   MCH 28.9 12/06/2023 1232   MCH 29.1 10/04/2022 0355   MCHC 32.2 12/06/2023 1232   MCHC 32.6 08/24/2023 1233   RDW 12.8 12/06/2023 1232   Iron Studies No results found for: IRON, TIBC, FERRITIN, IRONPCTSAT Lipid Panel     Component Value Date/Time   CHOL 154 03/25/2024 1438   TRIG 104 03/25/2024 1438   HDL 37 (L) 03/25/2024 1438   CHOLHDL 4.2 03/25/2024 1438   CHOLHDL 3 08/24/2023 1233   VLDL 17.4 08/24/2023 1233   LDLCALC 98 03/25/2024 1438    LDLDIRECT 97 03/25/2024 1438   LDLDIRECT 153.9 07/10/2013 1045   Hepatic Function Panel     Component Value Date/Time   PROT 7.3 12/06/2023 1232   ALBUMIN 4.6 12/06/2023 1232   AST 34 12/06/2023 1232   ALT 28 12/06/2023 1232   ALKPHOS 129 (H) 12/06/2023 1232   BILITOT 0.9 12/06/2023 1232   BILIDIR 0.2 01/25/2020 0303   IBILI 1.1 (H) 01/25/2020 0303      Component Value Date/Time   TSH 1.540 12/06/2023 1232   Nutritional Lab Results  Component Value Date  VD25OH 79.8 03/25/2024   VD25OH 8.5 (L) 12/06/2023    Attestations:   LILLETTE Damien Blanks, acting as a medical scribe for Linda Jenkins, DO., have compiled all relevant documentation for today's office visit on behalf of Linda Jenkins, DO, while in the presence of [employer], DO.  I have reviewed the above documentation for accuracy and completeness, and I agree with the above. Linda JINNY Blackburn, D.O.  The 21st Century Cures Act was signed into law in 2016 which includes the topic of electronic health records.  This provides immediate access to information in MyChart.  This includes consultation notes, operative notes, office notes, lab results and pathology reports.  If you have any questions about what you read please let us  know at your next visit so we can discuss your concerns and take corrective action if need be.  We are right here with you.

## 2024-06-06 ENCOUNTER — Other Ambulatory Visit: Payer: Self-pay | Admitting: Family Medicine

## 2024-06-06 DIAGNOSIS — Z1231 Encounter for screening mammogram for malignant neoplasm of breast: Secondary | ICD-10-CM

## 2024-06-11 ENCOUNTER — Ambulatory Visit (INDEPENDENT_AMBULATORY_CARE_PROVIDER_SITE_OTHER): Admitting: Family Medicine

## 2024-06-11 ENCOUNTER — Encounter (INDEPENDENT_AMBULATORY_CARE_PROVIDER_SITE_OTHER): Payer: Self-pay | Admitting: Family Medicine

## 2024-06-11 VITALS — BP 90/60 | HR 62 | Temp 98.3°F | Ht 61.0 in | Wt 183.0 lb

## 2024-06-11 DIAGNOSIS — M8580 Other specified disorders of bone density and structure, unspecified site: Secondary | ICD-10-CM | POA: Diagnosis not present

## 2024-06-11 DIAGNOSIS — Z6834 Body mass index (BMI) 34.0-34.9, adult: Secondary | ICD-10-CM

## 2024-06-11 DIAGNOSIS — E559 Vitamin D deficiency, unspecified: Secondary | ICD-10-CM | POA: Diagnosis not present

## 2024-06-11 DIAGNOSIS — Z6838 Body mass index (BMI) 38.0-38.9, adult: Secondary | ICD-10-CM

## 2024-06-11 DIAGNOSIS — E88819 Insulin resistance, unspecified: Secondary | ICD-10-CM | POA: Diagnosis not present

## 2024-06-11 DIAGNOSIS — I10 Essential (primary) hypertension: Secondary | ICD-10-CM | POA: Diagnosis not present

## 2024-06-11 MED ORDER — VITAMIN D (ERGOCALCIFEROL) 1.25 MG (50000 UNIT) PO CAPS: ORAL_CAPSULE | ORAL | 0 refills | Status: DC

## 2024-06-11 MED ORDER — METFORMIN HCL 500 MG PO TABS: ORAL_TABLET | ORAL | 0 refills | Status: DC

## 2024-06-11 NOTE — Progress Notes (Signed)
 Linda Blackburn, D.O.  ABFM, ABOM Specializing in Clinical Bariatric Medicine  Office located at: [address] [place], [place]  [address]    FOR THE CHRONIC DISEASE OF OBESITY:   Morbid obesity (HCC) - start BMI 39.8; BMI 38.0-38.9,adult -- current BMI 34.6  Weight Summary and Body Composition Analysis  Weight Lost Since Last Visit: 8lb  Weight Gained Since Last Visit: 0    Vitals Temp: 98.3 F (36.8 C) BP: 90/60 Pulse Rate: 62 SpO2: 99 %   Anthropometric Measurements Height: 5' 1 (1.549 m) Weight: 183 lb (83 kg) BMI (Calculated): 34.6 Weight at Last Visit: 191lb Weight Lost Since Last Visit: 8lb Weight Gained Since Last Visit: 0 Starting Weight: 233lb Total Weight Loss (lbs): 50 lb (22.7 kg) Peak Weight: 244lb   Body Composition  Body Fat %: 46 % Fat Mass (lbs): 84.4 lbs Muscle Mass (lbs): 94 lbs Total Body Water (lbs): 69.2 lbs Visceral Fat Rating : 15   Other Clinical Data Fasting: no Labs: no Today's Visit #: 11 Starting Date: 12/06/23    Chief complaint: Obesity  Interval History Linda Blackburn is here for a follow-up office visit to discuss her progress with her obesity treatment plan. She is on the Category 1 Plan and keeping a food journal and adhering to recommended goals of [phone] calories and states she is following her eating plan approximately 96 % of the time. She is using the treadmill and stationary bicycle 30  minutes 5 days per week  She has experienced a weight loss of 8 lbs since last OV on 05/21/2024.   Her dietary and life habits include:  - Tracking Calories/Macros: yes, she averages 1000 cal daily. Lowest was 448 cal on one occasion.   - Eating More Whole Foods: yes  - Adequate Protein Intake: yes, she averages 80+ grams protein daily. Lowest was 38 grams on one occasion.  - Adequate Water Intake: no  - Skipping Meals: yes  - Sleeping 7-9 Hours/ Night: yes    05/21/24 13:00 06/11/24 13:00   Body  Fat % 48.2 % 46 %  Muscle Mass (lbs) 94.4 lbs 94 lbs  Fat Mass (lbs) 92.4 lbs 84.4 lbs  Total Body Water (lbs) 72.4 lbs 69.2 lbs  Visceral Fat Rating  16 15   Counseling done on how various foods will affect these numbers and how to maximize success  Total lbs lost to date: -50 lbs Total Fat Mass lost to date: -44 lbs Total weight loss percentage to date: -21.46 %   Nutritional and Behavioral Counseling:  We discussed the following today: continue to work on maintaining a reduced calorie state, getting the recommended amount of protein, incorporating whole foods, making healthy choices, staying well hydrated and practicing mindfulness when eating.  Additional resources provided today: None  Evidence-based interventions for health behavior change were utilized today including the discussion of self monitoring techniques, problem-solving barriers and SMART goal setting techniques.   Regarding patient's less desirable eating habits and patterns, we employed the technique of small changes.   SMART Goal(s) created today: n/a   Recommended Dietary Goals Linda Blackburn is currently in the action stage of change. As such, her goal is to continue weight management plan.  She has agreed to continue journaling [phone] calories and 80+ grams protein daily.    Recommended Physical Activity Goals Linda Blackburn has been advised to work up to 300-450 minutes of moderate intensity aerobic activity a week and strengthening exercises 2-3 times per week for  cardiovascular health, weight loss maintenance and preservation of muscle mass.   She was encouraged to Continue to gradually increase the amount and intensity of exercise routine   Medical Interventions and Pharmacotherapy Previous Bariatric surgery: n/a Pharmacotherapy: See I.R note   OBESITY RELATED CONDITIONS ADDRESSED TODAY:    Medications Discontinued During This Encounter  Medication Reason   calcium -vitamin D  (OSCAL WITH D) 500-5 MG-MCG tablet     Vitamin D , Ergocalciferol , (DRISDOL ) 1.25 MG (50000 UNIT) CAPS capsule Reorder   metFORMIN  (GLUCOPHAGE ) 500 MG tablet Reorder     Meds ordered this encounter  Medications   Vitamin D , Ergocalciferol , (DRISDOL ) 1.25 MG (50000 UNIT) CAPS capsule    Sig: 1 po q 10 days    Dispense:  9 capsule    Refill:  0   metFORMIN  (GLUCOPHAGE ) 500 MG tablet    Sig: 1/2 po with lunch and dinner daily    Dispense:  30 tablet    Refill:  0    30 d supply;  ** OV for RF **   Do not send RF request     Obtain fasting labs (insulin , CMP, Vit D, B12, etc) next OV   Primary hypertension Assessment & Plan: BP Readings from Last 3 Encounters:  06/11/24 90/60  05/21/24 128/76  04/30/24 113/75   On Losartan  100 mg daily. BP low today. Pt asx. No acute concerns.  She sometimes checks her BP at home; systolic BP averages 126-129. Cont BP monitoring, antihypertensive, and low sodium heart healthy MP. Monitor for orthostasis while losing weight.     Insulin  resistance Assessment & Plan: Lab Results  Component Value Date   HGBA1C 5.0 12/06/2023   HGBA1C 5.2 08/24/2023   INSULIN  7.3 12/06/2023    LOV on 9/17, we started pt on Metformin  250 mg daily with lunch for her sugar cravings. She is tolerating the Metformin  well. On some days, she notices a difference in her sugar cravings and on other days she does not. Shared decision making: INCREASE to Metformin  250 mg with lunch and dinner. Reviewed risks/benefits. Cont prudent nutritional plan. Increase exercise as able.     Osteopenia, unspecified location Vitamin D  deficiency Assessment & Plan: Lab Results  Component Value Date   VD25OH 79.8 03/25/2024   VD25OH 8.5 (L) 12/06/2023   DEXA scan dated 03/04/2024 showed mild osteopenia. She is on ERGO 50,000 units q 10 days and  Calcium /Vitamin-D 1200 mg Ca q 3 days. She states the calcium /Vitamin D  supplement has contributed to GI upset the past 2 months. Shared decision making: D/C calcium /Vitamin D   supplement due to intolerance. Cont strength Vit D  (refill today) and focusing on calcium  rich foods. Recommend weight bearing and weight lifting exercises to prevent further deterioration of bone health. Recheck Vit D in 1 month or so.     Objective:   PHYSICAL EXAM: Blood pressure 90/60, pulse 62, temperature 98.3 F (36.8 C), height 5' 1 (1.549 m), weight 183 lb (83 kg), SpO2 99%. Body mass index is 34.58 kg/m.  General: she is overweight, cooperative and in no acute distress. PSYCH: Has normal mood, affect and thought process.   HEENT: EOMI, sclerae are anicteric. Lungs: Normal breathing effort, no conversational dyspnea. Extremities: Moves * 4 Neurologic: A and O * 3, good insight  DIAGNOSTIC DATA REVIEWED: BMET    Component Value Date/Time   NA 141 03/25/2024 1438   K 4.1 03/25/2024 1438   CL 103 03/25/2024 1438   CO2 22 03/25/2024 1438   GLUCOSE  80 03/25/2024 1438   GLUCOSE 128 (H) 08/24/2023 1233   BUN 22 03/25/2024 1438   CREATININE 0.83 03/25/2024 1438   CALCIUM  9.9 03/25/2024 1438   GFRNONAA >60 10/04/2022 0355   GFRAA >60 01/25/2020 0303   Lab Results  Component Value Date   HGBA1C 5.0 12/06/2023   HGBA1C 5.2 08/24/2023   Lab Results  Component Value Date   INSULIN  7.3 12/06/2023   Lab Results  Component Value Date   TSH 1.540 12/06/2023   CBC    Component Value Date/Time   WBC 7.2 12/06/2023 1232   WBC 12.9 (H) 08/24/2023 1233   RBC 5.33 (H) 12/06/2023 1232   RBC 4.90 08/24/2023 1233   HGB 15.4 12/06/2023 1232   HCT 47.8 (H) 12/06/2023 1232   PLT 239 12/06/2023 1232   MCV 90 12/06/2023 1232   MCH 28.9 12/06/2023 1232   MCH 29.1 10/04/2022 0355   MCHC 32.2 12/06/2023 1232   MCHC 32.6 08/24/2023 1233   RDW 12.8 12/06/2023 1232   Iron Studies No results found for: IRON, TIBC, FERRITIN, IRONPCTSAT Lipid Panel     Component Value Date/Time   CHOL 154 03/25/2024 1438   TRIG 104 03/25/2024 1438   HDL 37 (L) 03/25/2024 1438    CHOLHDL 4.2 03/25/2024 1438   CHOLHDL 3 08/24/2023 1233   VLDL 17.4 08/24/2023 1233   LDLCALC 98 03/25/2024 1438   LDLDIRECT 97 03/25/2024 1438   LDLDIRECT 153.9 07/10/2013 1045   Hepatic Function Panel     Component Value Date/Time   PROT 7.3 12/06/2023 1232   ALBUMIN 4.6 12/06/2023 1232   AST 34 12/06/2023 1232   ALT 28 12/06/2023 1232   ALKPHOS 129 (H) 12/06/2023 1232   BILITOT 0.9 12/06/2023 1232   BILIDIR 0.2 01/25/2020 0303   IBILI 1.1 (H) 01/25/2020 0303      Component Value Date/Time   TSH 1.540 12/06/2023 1232   Nutritional Lab Results  Component Value Date   VD25OH 79.8 03/25/2024   VD25OH 8.5 (L) 12/06/2023     Follow up:   Return 07/09/2024 at 2:00 PM.  She was informed of the importance of frequent follow up visits to maximize her success with intensive lifestyle modifications for her multiple health conditions.   Attestations:   I, Special Puri, acting as a Stage manager for [employer], DO., have compiled all relevant documentation for today's office visit on behalf of Linda Jenkins, DO, while in the presence of [employer], DO.  Pertinent positives were addressed with patient today. Reviewed by clinician on day of visit: allergies, medications, problem list, medical history, surgical history, family history, social history, and previous encounter notes.  I have reviewed the above documentation for accuracy and completeness, and I agree with the above. Linda Blackburn, D.O.  The 21st Century Cures Act was signed into law in 2016 which includes the topic of electronic health records.  This provides immediate access to information in MyChart. This includes consultation notes, operative notes, office notes, lab results and pathology reports.  If you have any questions about what you read please let us  know at your next visit so we can discuss your concerns and take corrective action if need be.  We are right here with you.

## 2024-06-18 ENCOUNTER — Ambulatory Visit (INDEPENDENT_AMBULATORY_CARE_PROVIDER_SITE_OTHER)

## 2024-06-18 DIAGNOSIS — Z23 Encounter for immunization: Secondary | ICD-10-CM

## 2024-07-01 ENCOUNTER — Telehealth: Payer: Self-pay | Admitting: Family Medicine

## 2024-07-01 NOTE — Telephone Encounter (Signed)
 Pt will need OV to discuss so that appropriate documentation can be submitted to insurance when prior auth is done.   Please reach out to pt to assist with scheduling.

## 2024-07-01 NOTE — Telephone Encounter (Signed)
 Noted

## 2024-07-01 NOTE — Telephone Encounter (Signed)
 Patient called and is about to go to [place]  and another trip for the holidays. She has been having trouble with her back and knows someone that has done cortisone in their back. She wants to know if she could do cortisone on both sides of her back. Please advise

## 2024-07-01 NOTE — Telephone Encounter (Signed)
 Spoke with patient and she is going to discuss back injections at her next appointment for her knee injections. FYI

## 2024-07-01 NOTE — Telephone Encounter (Signed)
 Ran benefits for bilateral knee orthovisc Case [id number]

## 2024-07-08 NOTE — Telephone Encounter (Signed)
 Portal states this is still in progress

## 2024-07-08 NOTE — Telephone Encounter (Signed)
 Patient called back to follow up on this authorization

## 2024-07-09 ENCOUNTER — Encounter (INDEPENDENT_AMBULATORY_CARE_PROVIDER_SITE_OTHER): Payer: Self-pay | Admitting: Family Medicine

## 2024-07-09 ENCOUNTER — Ambulatory Visit (INDEPENDENT_AMBULATORY_CARE_PROVIDER_SITE_OTHER): Payer: Self-pay | Admitting: Family Medicine

## 2024-07-09 VITALS — BP 137/80 | HR 60 | Temp 97.7°F | Ht 61.0 in | Wt 182.0 lb

## 2024-07-09 DIAGNOSIS — E538 Deficiency of other specified B group vitamins: Secondary | ICD-10-CM | POA: Diagnosis not present

## 2024-07-09 DIAGNOSIS — Z6834 Body mass index (BMI) 34.0-34.9, adult: Secondary | ICD-10-CM | POA: Diagnosis not present

## 2024-07-09 DIAGNOSIS — E88819 Insulin resistance, unspecified: Secondary | ICD-10-CM | POA: Diagnosis not present

## 2024-07-09 DIAGNOSIS — E559 Vitamin D deficiency, unspecified: Secondary | ICD-10-CM | POA: Diagnosis not present

## 2024-07-09 DIAGNOSIS — I10 Essential (primary) hypertension: Secondary | ICD-10-CM

## 2024-07-09 MED ORDER — METFORMIN HCL 500 MG PO TABS: ORAL_TABLET | ORAL | 0 refills | Status: DC

## 2024-07-09 NOTE — Telephone Encounter (Signed)
 Scheduled 07/14/2024

## 2024-07-09 NOTE — Progress Notes (Signed)
 Linda Blackburn, D.O.  ABFM, ABOM Specializing in Clinical Bariatric Medicine  Office located at: [address] [place], [place]  [address]    FOR THE CHRONIC DISEASE OF OBESITY:   Morbid obesity (HCC) - start BMI 39.8 BMI 34.0-34.9,adult - current BMI 34.41  Weight Summary and Body Composition Analysis  Weight Lost Since Last Visit: 1lb  Weight Gained Since Last Visit: 0lb    Vitals Temp: 97.7 F (36.5 C) BP: 137/80 Pulse Rate: 60 SpO2: 99 %   Anthropometric Measurements Height: 5' 1 (1.549 m) Weight: 182 lb (82.6 kg) BMI (Calculated): 34.41 Weight at Last Visit: 18lb Weight Lost Since Last Visit: 1lb Weight Gained Since Last Visit: 0lb Starting Weight: 233lb Total Weight Loss (lbs): 51 lb (23.1 kg) Peak Weight: 244lb   Body Composition  Body Fat %: 46.8 % Fat Mass (lbs): 85.4 lbs Muscle Mass (lbs): 92 lbs Total Body Water (lbs): 70.2 lbs Visceral Fat Rating : 15   Other Clinical Data Fasting: yes Labs: yes Today's Visit #: 12 Starting Date: 12/06/23    Chief complaint: Obesity  Interval History [name] is here for a follow-up office visit to discuss her progress with her obesity treatment plan. She is on the Category 1 Plan and keeping a food journal and adhering to recommended goals of [phone] calorie and 80+ grams protein daily and states she is following her eating plan approximately 95 % of the time. She is using the treadmill and or stationary bike 30  minutes 4-5 days per week  She has experienced a weight loss of 1 lb since last OV on 06/11/2024.   Her dietary and life habits include:  - Tracking Calories/Macros: yes - averages 900 cal/day.   - Eating More Whole Foods: yes  - Adequate Protein Intake: yes  - Adequate Water Intake: yes  - Sometimes skipping meals when busy or I do not feel hungry at times  - Sleeping 7-9 Hours/ Night: yes    06/11/24 13:00 07/09/24 13:00   Body Fat % 46 % 46.8 %  Muscle  Mass (lbs) 94 lbs 92 lbs  Fat Mass (lbs) 84.4 lbs 85.4 lbs  Total Body Water (lbs) 69.2 lbs 70.2 lbs  Visceral Fat Rating  15 15   Counseling done on how various foods will affect these numbers and how to maximize success  Total Fat mass lost to date: - 43 lbs Total lbs lost to date: - 51 lbs Total weight loss percentage to date: - 21.89 %   Nutritional and Behavioral Counseling:  We discussed the following today: increasing lean protein intake to established goals, high protein snacking choices, avoiding skipping meals, continue to work on implementation of reduced calorie nutritional plan, eating multiple small meals a day to get in all their foods, and celebration eating strategies  Additional resources provided today:  n/a  Evidence-based interventions for health behavior change were utilized today including the discussion of self monitoring techniques, problem-solving barriers and SMART goal setting techniques.   Regarding patient's less desirable eating habits and patterns, we employed the technique of small changes.   SMART Goal(s) created today: avoid skipping meals   Recommended Dietary Goals Bradlee is currently in the action stage of change. As such, her goal is to continue weight management plan.  She has agreed to continue journaling [phone] calories and 80+ grams protein daily.    Recommended Physical Activity Goals Lynden has been advised to work up to 300-450 minutes of moderate intensity  aerobic activity a week and strengthening exercises 2-3 times per week for cardiovascular health, weight loss maintenance and preservation of muscle mass.   She was encouraged to continue to gradually increase the amount and intensity of exercise routine   Medical Interventions and Pharmacotherapy Previous Bariatric surgery: n/a Pharmacotherapy: Cont Metformin  at same dose.    OBESITY RELATED CONDITIONS ADDRESSED TODAY:   Orders Placed This Encounter  Procedures   Insulin ,  random   Vitamin B12   VITAMIN D  25 Hydroxy (Vit-D Deficiency, Fractures)   Basic metabolic panel with GFR   Magnesium     Medications Discontinued During This Encounter  Medication Reason   metFORMIN  (GLUCOPHAGE ) 500 MG tablet Reorder     Meds ordered this encounter  Medications   metFORMIN  (GLUCOPHAGE ) 500 MG tablet    Sig: 1/2 po with lunch and dinner daily    Dispense:  30 tablet    Refill:  0    30 d supply;  ** OV for RF **   Do not send RF request     Primary hypertension Assessment & Plan: BP Readings from Last 3 Encounters:  07/09/24 137/80  06/11/24 90/60  05/21/24 128/76   BP stable today. No acute concerns. Cont Losartan  100 mg daily and low sodium heart healthy MP. Cont engaging in regular exercise. Recheck BMP with GFR.    Insulin  resistance Assessment & Plan: Lab Results  Component Value Date   HGBA1C 5.0 12/06/2023   HGBA1C 5.2 08/24/2023   INSULIN  7.3 12/06/2023    She is prescribed Metformin  250 mg twice daily with reported good compliance. She sometimes has loose stools when she eats off-plan. Reminded pt the  the more she eats off-plan, the more likely for her to have side effects from the Metformin . Hunger and cravings are stable. Continue Metformin  therapy (refill today, no dose change) and journaling plan with a focus on protein, fruits, and vegetables while limiting simple carbohydrates. Recheck fasting insulin  today.    Vitamin D  deficiency Assessment & Plan: Lab Results  Component Value Date   VD25OH 79.8 03/25/2024   VD25OH 8.5 (L) 12/06/2023   She is prescribed strength Vit D, however reports that she has difficulty remembering to take it every 10 days. Shared decision making: SWITCH to OTC Vit D 5,000 lU daily. Will recheck Vit D levels today.   B12 deficiency Assessment & Plan: Lab Results  Component Value Date   VITAMINB12 1,110 03/25/2024   Will recheck B12 levels today. Cont OTC Vit B12 1,000 mcg every other day and  nutrient rich diet.    Objective:   PHYSICAL EXAM: Blood pressure 137/80, pulse 60, temperature 97.7 F (36.5 C), height 5' 1 (1.549 m), weight 182 lb (82.6 kg), SpO2 99%. Body mass index is 34.39 kg/m.  General: she is overweight, cooperative and in no acute distress. PSYCH: Has normal mood, affect and thought process.   HEENT: EOMI, sclerae are anicteric. Lungs: Normal breathing effort, no conversational dyspnea. Extremities: Moves * 4 Neurologic: A and O * 3, good insight  DIAGNOSTIC DATA REVIEWED: BMET    Component Value Date/Time   NA 141 03/25/2024 1438   K 4.1 03/25/2024 1438   CL 103 03/25/2024 1438   CO2 22 03/25/2024 1438   GLUCOSE 80 03/25/2024 1438   GLUCOSE 128 (H) 08/24/2023 1233   BUN 22 03/25/2024 1438   CREATININE 0.83 03/25/2024 1438   CALCIUM  9.9 03/25/2024 1438   GFRNONAA >60 10/04/2022 0355   GFRAA >60 01/25/2020 0303  Lab Results  Component Value Date   HGBA1C 5.0 12/06/2023   HGBA1C 5.2 08/24/2023   Lab Results  Component Value Date   INSULIN  7.3 12/06/2023   Lab Results  Component Value Date   TSH 1.540 12/06/2023   CBC    Component Value Date/Time   WBC 7.2 12/06/2023 1232   WBC 12.9 (H) 08/24/2023 1233   RBC 5.33 (H) 12/06/2023 1232   RBC 4.90 08/24/2023 1233   HGB 15.4 12/06/2023 1232   HCT 47.8 (H) 12/06/2023 1232   PLT 239 12/06/2023 1232   MCV 90 12/06/2023 1232   MCH 28.9 12/06/2023 1232   MCH 29.1 10/04/2022 0355   MCHC 32.2 12/06/2023 1232   MCHC 32.6 08/24/2023 1233   RDW 12.8 12/06/2023 1232   Iron Studies No results found for: IRON, TIBC, FERRITIN, IRONPCTSAT Lipid Panel     Component Value Date/Time   CHOL 154 03/25/2024 1438   TRIG 104 03/25/2024 1438   HDL 37 (L) 03/25/2024 1438   CHOLHDL 4.2 03/25/2024 1438   CHOLHDL 3 08/24/2023 1233   VLDL 17.4 08/24/2023 1233   LDLCALC 98 03/25/2024 1438   LDLDIRECT 97 03/25/2024 1438   LDLDIRECT 153.9 07/10/2013 1045   Hepatic Function Panel      Component Value Date/Time   PROT 7.3 12/06/2023 1232   ALBUMIN 4.6 12/06/2023 1232   AST 34 12/06/2023 1232   ALT 28 12/06/2023 1232   ALKPHOS 129 (H) 12/06/2023 1232   BILITOT 0.9 12/06/2023 1232   BILIDIR 0.2 01/25/2020 0303   IBILI 1.1 (H) 01/25/2020 0303      Component Value Date/Time   TSH 1.540 12/06/2023 1232   Nutritional Lab Results  Component Value Date   VD25OH 79.8 03/25/2024   VD25OH 8.5 (L) 12/06/2023     Follow up:   Return 08/06/2024 at 2:00 PM.  She was informed of the importance of frequent follow up visits to maximize her success with intensive lifestyle modifications for her multiple health conditions.  Delynn Olvera is aware that we will review all of her lab results at our next visit together in person.  She is aware that if anything is critical/ life threatening with the results, we will be contacting her via MyChart or by my CMA will be calling them prior to the office visit to discuss acute management.    Attestations:   I, Special Puri, acting as a stage manager for [employer], DO., have compiled all relevant documentation for today's office visit on behalf of Linda Jenkins, DO, while in the presence of [employer], DO.  Pertinent positives were addressed with patient today. Reviewed by clinician on day of visit: allergies, medications, problem list, medical history, surgical history, family history, social history, and previous encounter notes.  I have reviewed the above documentation for accuracy and completeness, and I agree with the above. Linda JINNY Blackburn, D.O.  The 21st Century Cures Act was signed into law in 2016 which includes the topic of electronic health records.  This provides immediate access to information in MyChart. This includes consultation notes, operative notes, office notes, lab results and pathology reports.  If you have any questions about what you read please let us  know at your next visit so we can discuss  your concerns and take corrective action if need be.  We are right here with you.

## 2024-07-09 NOTE — Telephone Encounter (Signed)
 Left message for patient to call back to schedule. I do have these in stock.

## 2024-07-09 NOTE — Telephone Encounter (Signed)
 Orthovisc authorized for bilateral knee NO PRE CERT REQUIRED  Deductible $257 has met $257 OOP does not apply Patient is responsible for 20% coinsurance Reference # [id number] [employer] may cover the 20% coinsurance but patient will have to confirm with them.

## 2024-07-10 LAB — BASIC METABOLIC PANEL WITH GFR
BUN/Creatinine Ratio: 28 (ref 12–28)
BUN: 18 mg/dL (ref 8–27)
CO2: 23 mmol/L (ref 20–29)
Calcium: 9.6 mg/dL (ref 8.7–10.3)
Chloride: 105 mmol/L (ref 96–106)
Creatinine, Ser: 0.65 mg/dL (ref 0.57–1.00)
Glucose: 82 mg/dL (ref 70–99)
Potassium: 3.7 mmol/L (ref 3.5–5.2)
Sodium: 142 mmol/L (ref 134–144)
eGFR: 92 mL/min/1.73 (ref >59)

## 2024-07-10 LAB — MAGNESIUM: Magnesium: 2.1 mg/dL (ref 1.6–2.3)

## 2024-07-10 LAB — VITAMIN B12: Vitamin B-12: 891 pg/mL (ref 232–1245)

## 2024-07-10 LAB — INSULIN, RANDOM: INSULIN: 7.3 u[IU]/mL (ref 2.6–24.9)

## 2024-07-10 LAB — VITAMIN D 25 HYDROXY (VIT D DEFICIENCY, FRACTURES): Vit D, 25-Hydroxy: 96.9 ng/mL (ref 30.0–100.0)

## 2024-07-14 ENCOUNTER — Encounter: Payer: Self-pay | Admitting: Family Medicine

## 2024-07-14 ENCOUNTER — Ambulatory Visit: Admitting: Family Medicine

## 2024-07-14 ENCOUNTER — Ambulatory Visit (INDEPENDENT_AMBULATORY_CARE_PROVIDER_SITE_OTHER)

## 2024-07-14 ENCOUNTER — Other Ambulatory Visit: Payer: Self-pay

## 2024-07-14 VITALS — BP 138/76 | HR 56 | Ht 61.0 in | Wt 182.0 lb

## 2024-07-14 DIAGNOSIS — M25562 Pain in left knee: Secondary | ICD-10-CM

## 2024-07-14 DIAGNOSIS — M25561 Pain in right knee: Secondary | ICD-10-CM

## 2024-07-14 DIAGNOSIS — M170 Bilateral primary osteoarthritis of knee: Secondary | ICD-10-CM

## 2024-07-14 DIAGNOSIS — M545 Low back pain, unspecified: Secondary | ICD-10-CM

## 2024-07-14 DIAGNOSIS — M48061 Spinal stenosis, lumbar region without neurogenic claudication: Secondary | ICD-10-CM | POA: Diagnosis not present

## 2024-07-14 DIAGNOSIS — M4807 Spinal stenosis, lumbosacral region: Secondary | ICD-10-CM | POA: Diagnosis not present

## 2024-07-14 DIAGNOSIS — G8929 Other chronic pain: Secondary | ICD-10-CM

## 2024-07-14 DIAGNOSIS — M79606 Pain in leg, unspecified: Secondary | ICD-10-CM | POA: Diagnosis not present

## 2024-07-14 DIAGNOSIS — M47816 Spondylosis without myelopathy or radiculopathy, lumbar region: Secondary | ICD-10-CM | POA: Diagnosis not present

## 2024-07-14 MED ORDER — HYALURONAN 30 MG/2ML IX SOSY
30.0000 mg | PREFILLED_SYRINGE | Freq: Once | INTRA_ARTICULAR | Status: AC
  Administered 2024-07-14: 30 mg via INTRA_ARTICULAR

## 2024-07-14 NOTE — Patient Instructions (Addendum)
 Thank you for coming in today.   You received an injection today. Seek immediate medical attention if the joint becomes red, extremely painful, or is oozing fluid.   Please get an Xray today before you leave   See you back in 1 week for bilat Orthovisc #2

## 2024-07-14 NOTE — Progress Notes (Signed)
 I, Leotis Batter, CMA acting as a scribe for Artist Lloyd, MD.  Donny Gammons Snare is a 73 y.o. female who presents to [facility] at [facility] today for exacerbation of bilateral knee pain. Pt was last seen by Dr. Lloyd 12/11/23-12/31/23 for Bilat Orthovisc injections x 3.    Today, patient reports exacerbation of knee sx over the past week. Has gotten god relief with Orthovisc injections in the past. Denies visible swelling. Notes worsening pain in the legs at night time. Also c/o intermittent lower back pain. Taking Advil back and body with short-term relief.   Diagnostic Imaging  06/03/23 MRI L Knee  02/21/23 XR Bilat Knees   Pertinent review of systems: No fevers or chills  Relevant historical information: Hypertension.   Exam:  BP 138/76   Pulse (!) 56   Ht 5' 1 (1.549 m)   Wt 182 lb (82.6 kg)   SpO2 98%   BMI 34.39 kg/m  General: Well Developed, well nourished, and in no acute distress.   MSK: L-spine nontender to palpation.  Decreased lumbar motion lower extremity strength is intact  Bilateral knees normal appearing normal motion no significant effusion.  Lab and Radiology Results  Tyona Nilsen presents to clinic today for Orthovisc injection bilateral knee 1/3 Procedure: Real-time Ultrasound Guided Injection of right knee joint superior lateral patellar space Device: Philips Affiniti 50G/GE Logiq Images permanently stored and available for review in PACS Verbal informed consent obtained.  Discussed risks and benefits of procedure. Warned about infection, bleeding, damage to structures among others. Patient expresses understanding and agreement Time-out conducted.   Noted no overlying erythema, induration, or other signs of local infection.   Skin prepped in a sterile fashion.   Local anesthesia: Topical Ethyl chloride.   With sterile technique and under real time ultrasound guidance: Orthovisc 30 mg injected into knee joint. Fluid  seen entering the joint capsule.   Completed without difficulty   Advised to call if fevers/chills, erythema, induration, drainage, or persistent bleeding.   Images permanently stored and available for review in the ultrasound unit.  Impression: Technically successful ultrasound guided injection.  Procedure: Real-time Ultrasound Guided Injection of left knee joint superior lateral patellar space Device: Philips Affiniti 50G/GE Logiq Images permanently stored and available for review in PACS Verbal informed consent obtained.  Discussed risks and benefits of procedure. Warned about infection, bleeding, damage to structures among others. Patient expresses understanding and agreement Time-out conducted.   Noted no overlying erythema, induration, or other signs of local infection.   Skin prepped in a sterile fashion.   Local anesthesia: Topical Ethyl chloride.   With sterile technique and under real time ultrasound guidance: Orthovisc 30 mg injected into knee joint. Fluid seen entering the joint capsule.   Completed without difficulty   Advised to call if fevers/chills, erythema, induration, drainage, or persistent bleeding.   Images permanently stored and available for review in the ultrasound unit.  Impression: Technically successful ultrasound guided injection. Lot number: [id number]  X-ray images lumbar spine obtained today personally and independently interpreted. DDD L4-L5 and L5-S1 no acute fracture.  Await formal radiology review    Assessment and Plan: 73 y.o. female with bilateral knee pain due to DJD exacerbation.  Plan for Orthovisc injection series starting today.  Return in 1 week for Orthovisc injection bilateral knees 2/3.  Chronic back pain with some pain rating down the legs.  Patient does have some degenerative changes on prior lumbar spine imaging.  X-rays were reviewed  again today.  Plan for home exercise program.  Consider formal physical therapy.  If not improved we will  ultimately proceed to MRI for potential injection planning.  PDMP not reviewed this encounter. Orders Placed This Encounter  Procedures   US  LIMITED JOINT SPACE STRUCTURES LOW BILAT(NO LINKED CHARGES)    Reason for Exam (SYMPTOM  OR DIAGNOSIS REQUIRED):   bilat knee pain / OA    Preferred imaging location?:   [facility] [place] Lumbar Spine 2-3 Views    Standing Status:   Future    Expiration Date:   08/13/2024    Reason for Exam (SYMPTOM  OR DIAGNOSIS REQUIRED):   low back and leg pain    Preferred imaging location?:   [facility] ordered this encounter  Medications   Hyaluronan (ORTHOVISC) intra-articular injection 30 mg   Hyaluronan (ORTHOVISC) intra-articular injection 30 mg     Discussed warning signs or symptoms. Please see discharge instructions. Patient expresses understanding.   The above documentation has been reviewed and is accurate and complete Artist Lloyd, M.D.

## 2024-07-17 ENCOUNTER — Ambulatory Visit: Payer: Self-pay | Admitting: Family Medicine

## 2024-07-17 NOTE — Progress Notes (Signed)
 Low back xray shows arthritis mostly at the base of the spine.

## 2024-07-22 ENCOUNTER — Ambulatory Visit: Admitting: Family Medicine

## 2024-07-22 ENCOUNTER — Other Ambulatory Visit: Payer: Self-pay

## 2024-07-22 DIAGNOSIS — M170 Bilateral primary osteoarthritis of knee: Secondary | ICD-10-CM

## 2024-07-22 MED ORDER — HYALURONAN 30 MG/2ML IX SOSY
30.0000 mg | PREFILLED_SYRINGE | Freq: Once | INTRA_ARTICULAR | Status: AC
  Administered 2024-07-22: 30 mg via INTRA_ARTICULAR

## 2024-07-22 NOTE — Patient Instructions (Addendum)
 Thank you for coming in today.   You received an injection today. Seek immediate medical attention if the joint becomes red, extremely painful, or is oozing fluid.   We will see you December 2nd for the 3rd gel shot.  Have a HAPPY Thanksgiving!

## 2024-07-22 NOTE — Progress Notes (Signed)
  Linda Blackburn presents to clinic today for Orthovisc injection right knee 2/3 Procedure: Real-time Ultrasound Guided Injection of right knee joint superior lateral patellar space Device: Philips Affiniti 50G/GE Logiq Images permanently stored and available for review in PACS Verbal informed consent obtained.  Discussed risks and benefits of procedure. Warned about infection, bleeding, damage to structures among others. Patient expresses understanding and agreement Time-out conducted.   Noted no overlying erythema, induration, or other signs of local infection.   Skin prepped in a sterile fashion.   Local anesthesia: Topical Ethyl chloride.   With sterile technique and under real time ultrasound guidance: Orthovisc 30 mg injected into knee joint. Fluid seen entering the joint capsule.   Completed without difficulty   Advised to call if fevers/chills, erythema, induration, drainage, or persistent bleeding.   Images permanently stored and available for review in the ultrasound unit.  Impression: Technically successful ultrasound guided injection.  Procedure: Real-time Ultrasound Guided Injection of left knee joint superior lateral patellar space Device: Philips Affiniti 50G/GE Logiq Images permanently stored and available for review in PACS Verbal informed consent obtained.  Discussed risks and benefits of procedure. Warned about infection, bleeding, damage to structures among others. Patient expresses understanding and agreement Time-out conducted.   Noted no overlying erythema, induration, or other signs of local infection.   Skin prepped in a sterile fashion.   Local anesthesia: Topical Ethyl chloride.   With sterile technique and under real time ultrasound guidance: Orthovisc 30 mg injected into knee joint. Fluid seen entering the joint capsule.   Completed without difficulty   Advised to call if fevers/chills, erythema, induration, drainage, or persistent bleeding.   Images  permanently stored and available for review in the ultrasound unit.  Impression: Technically successful ultrasound guided injection. Lot number: [id number]

## 2024-08-05 ENCOUNTER — Ambulatory Visit: Admitting: Family Medicine

## 2024-08-05 ENCOUNTER — Other Ambulatory Visit: Payer: Self-pay

## 2024-08-05 DIAGNOSIS — M25561 Pain in right knee: Secondary | ICD-10-CM | POA: Diagnosis not present

## 2024-08-05 DIAGNOSIS — G8929 Other chronic pain: Secondary | ICD-10-CM | POA: Diagnosis not present

## 2024-08-05 DIAGNOSIS — M170 Bilateral primary osteoarthritis of knee: Secondary | ICD-10-CM | POA: Diagnosis not present

## 2024-08-05 DIAGNOSIS — M25562 Pain in left knee: Secondary | ICD-10-CM | POA: Diagnosis not present

## 2024-08-05 MED ORDER — HYALURONAN 30 MG/2ML IX SOSY
30.0000 mg | PREFILLED_SYRINGE | Freq: Once | INTRA_ARTICULAR | Status: AC
  Administered 2024-08-05: 30 mg via INTRA_ARTICULAR

## 2024-08-05 NOTE — Patient Instructions (Signed)
Thank you for coming in today.   Call or go to the ER if you develop a large red swollen joint with extreme pain or oozing puss.   

## 2024-08-05 NOTE — Progress Notes (Signed)
  Linda Blackburn presents to clinic today for  orthovisc injection BL knee 3/3 Procedure: Real-time Ultrasound Guided Injection of right knee joint superior lateral patellar space Device: Philips Affiniti 50G/GE Logiq Images permanently stored and available for review in PACS Verbal informed consent obtained.  Discussed risks and benefits of procedure. Warned about infection, bleeding, damage to structures among others. Patient expresses understanding and agreement Time-out conducted.   Noted no overlying erythema, induration, or other signs of local infection.   Skin prepped in a sterile fashion.   Local anesthesia: Topical Ethyl chloride.   With sterile technique and under real time ultrasound guidance: Orthovisc 30 mg injected into knee joint. Fluid seen entering the joint capsule.   Completed without difficulty   Advised to call if fevers/chills, erythema, induration, drainage, or persistent bleeding.   Images permanently stored and available for review in the ultrasound unit.  Impression: Technically successful ultrasound guided injection.  Procedure: Real-time Ultrasound Guided Injection of left knee joint superior lateral patellar space Device: [employer]ips Affiniti 50G/GE Logiq Images permanently stored and available for review in PACS Verbal informed consent obtained.  Discussed risks and benefits of procedure. Warned about infection, bleeding, damage to structures among others. Patient expresses understanding and agreement Time-out conducted.   Noted no overlying erythema, induration, or other signs of local infection.   Skin prepped in a sterile fashion.   Local anesthesia: Topical Ethyl chloride.   With sterile technique and under real time ultrasound guidance: Orthovisc 30 mg injected into knee joint. Fluid seen entering the joint capsule.   Completed without difficulty   Advised to call if fevers/chills, erythema, induration, drainage, or persistent bleeding.   Images  permanently stored and available for review in the ultrasound unit.  Impression: Technically successful ultrasound guided injection. Lot number: [id number]

## 2024-08-06 ENCOUNTER — Ambulatory Visit (INDEPENDENT_AMBULATORY_CARE_PROVIDER_SITE_OTHER): Payer: Self-pay | Admitting: Family Medicine

## 2024-08-06 ENCOUNTER — Encounter (INDEPENDENT_AMBULATORY_CARE_PROVIDER_SITE_OTHER): Payer: Self-pay | Admitting: Family Medicine

## 2024-08-06 VITALS — BP 113/70 | HR 57 | Temp 97.9°F | Ht 61.0 in | Wt 182.0 lb

## 2024-08-06 DIAGNOSIS — Z6834 Body mass index (BMI) 34.0-34.9, adult: Secondary | ICD-10-CM | POA: Diagnosis not present

## 2024-08-06 DIAGNOSIS — I10 Essential (primary) hypertension: Secondary | ICD-10-CM

## 2024-08-06 DIAGNOSIS — E538 Deficiency of other specified B group vitamins: Secondary | ICD-10-CM | POA: Diagnosis not present

## 2024-08-06 DIAGNOSIS — E88819 Insulin resistance, unspecified: Secondary | ICD-10-CM | POA: Diagnosis not present

## 2024-08-06 DIAGNOSIS — E559 Vitamin D deficiency, unspecified: Secondary | ICD-10-CM | POA: Diagnosis not present

## 2024-08-06 MED ORDER — METFORMIN HCL 500 MG PO TABS: ORAL_TABLET | ORAL | 0 refills | Status: DC

## 2024-08-06 MED ORDER — VITAMIN D (ERGOCALCIFEROL) 1.25 MG (50000 UNIT) PO CAPS: ORAL_CAPSULE | ORAL | Status: DC

## 2024-08-06 NOTE — Progress Notes (Signed)
 Linda Blackburn, D.O.  ABFM, ABOM Specializing in Clinical Bariatric Medicine  Office located at: [address] [place], [place]  [address]    FOR THE CHRONIC DISEASE OF OBESITY:   Morbid obesity (HCC) - start BMI 39.8 BMI 34.0-34.9,adult - current BMI 34.41  Weight Summary and Body Composition Analysis  Weight Lost Since Last Visit: 0  Weight Gained Since Last Visit: 0    Vitals Temp: 97.9 F (36.6 C) BP: 113/70 Pulse Rate: (!) 57 SpO2: 97 %   Anthropometric Measurements Height: 5' 1 (1.549 m) Weight: 182 lb (82.6 kg) BMI (Calculated): 34.41 Weight at Last Visit: 182 lb Weight Lost Since Last Visit: 0 Weight Gained Since Last Visit: 0 Starting Weight: 233 lb Total Weight Loss (lbs): 51 lb (23.1 kg) Peak Weight: 244 lb   Body Composition  Body Fat %: 47.9 % Fat Mass (lbs): 87.2 lbs Muscle Mass (lbs): 90.2 lbs Total Body Water (lbs): 72.8 lbs Visceral Fat Rating : 15   Other Clinical Data Fasting: No Labs: No Today's Visit #: 13 Starting Date: 12/06/23    Chief complaint: Obesity  Interval History Linda Blackburn is here for a follow-up office visit to discuss her progress with her obesity treatment plan. She is on the Category 1 Plan and keeping a food journal and adhering to recommended goals of [id number] calories and 80+ grams protein and states she is following her eating plan approximately 88% of the time. She is using the treadmill or bike 30  minutes 4-5 days per week  She has experienced no weight change since last OV on 07/09/2024.   Her dietary and life habits include:  - Tracking Calories/Macros: not tracking at this time   - Eating More Whole Foods: yes  - Adequate Protein Intake: yes  - Adequate Water Intake: no  - Skipping Meals: yes  - Sleeping 7-9 Hours/ Night: yes    07/09/24 13:00 08/06/24 13:00   Body Fat % 46.8 % 47.9 %  Muscle Mass (lbs) 92 lbs 90.2 lbs  Fat Mass (lbs) 85.4 lbs 87.2 lbs  Total Body  Water (lbs) 70.2 lbs 72.8 lbs  Visceral Fat Rating  15 15   Counseling done on how various foods will affect these numbers and how to maximize success  Total Fat mass lost to date: - 41.2 lbs Total lbs lost to date: - 51 lbs Total weight loss percentage to date: - 21.89 %   Nutritional and Behavioral Counseling:  We discussed the following today: increasing lean protein intake to established goals, continue to work on implementation of reduced calorie nutritional plan, and staying on track while eting out.   Additional resources provided today: Handout on December Goals  Evidence-based interventions for health behavior change were utilized today including the discussion of self monitoring techniques, problem-solving barriers and SMART goal setting techniques.   Regarding patient's less desirable eating habits and patterns, we employed the technique of small changes.   Goal created today: lose weight in December   Recommended Dietary Goals Linda Blackburn is currently in the action stage of change. As such, her goal is to continue weight management plan.  She has agreed to continue Cat 1 MP.   Recommended Physical Activity Goals Linda Blackburn has been advised to work up to 300-450 minutes of moderate intensity aerobic activity a week and strengthening exercises 2-3 times per week for cardiovascular health, weight loss maintenance and preservation of muscle mass.   She was encouraged to continue to gradually increase  the amount and intensity of exercise routine.   Medical Interventions and Pharmacotherapy Previous Bariatric surgery: n/a Pharmacotherapy: See I.R note.   OBESITY RELATED CONDITIONS ADDRESSED TODAY:    Medications Discontinued During This Encounter  Medication Reason   Vitamin D , Ergocalciferol , (DRISDOL ) 1.25 MG (50000 UNIT) CAPS capsule Reorder   metFORMIN  (GLUCOPHAGE ) 500 MG tablet Reorder     Meds ordered this encounter  Medications   metFORMIN  (GLUCOPHAGE ) 500 MG tablet     Sig: 1/2 po with lunch and dinner daily    Dispense:  30 tablet    Refill:  0    30 d supply;  ** OV for RF **   Do not send RF request   Vitamin D , Ergocalciferol , (DRISDOL ) 1.25 MG (50000 UNIT) CAPS capsule    Sig: 1 po q 30 days     Insulin  resistance Assessment & Plan: Lab Results  Component Value Date   HGBA1C 5.0 12/06/2023   HGBA1C 5.2 08/24/2023   INSULIN  7.3 07/09/2024   INSULIN  7.3 12/06/2023   Lab Results  Component Value Date   CREATININE 0.65 07/09/2024   BUN 18 07/09/2024   NA 142 07/09/2024   K 3.7 07/09/2024   CL 105 07/09/2024   CO2 23 07/09/2024   On Metformin  250 mg twice daily with reported good compliance and tolerance. Hunger and cravings are stable. Reviewed labs with pt. Fasting insulin  stable; optimal < 5. Kidney function, electrolytes, and magnesium  are at goal. Continue Metformin  therapy at current dose and  working on nutrition plan to decrease simple carbohydrates, increase lean proteins and exercise to promote weight loss and improve glycemic control and prevent progression to Pre-DM.    Hypertension, unspecified type Assessment & Plan: BP Readings from Last 3 Encounters:  08/06/24 113/70  07/14/24 138/76  07/09/24 137/80   BP at goal today.    1) F/up   On *** with reported good compliance and tolerance. Blood pressure is *** today. No acute concerns.    - Maintain with current regimen OR Continue all medications.  - Goal BP: *** on a regular basis.  -    Pt advised to  Check BP at home ***     - Continue with low sodium diet, advance exercise as tolerated OR Continue to work on nutrition plan to promote weight loss and improve BP control.                B12 deficiency Assessment & Plan: Lab Results  Component Value Date   [id number] 891 07/09/2024   Reviewed labs with pt. B12 level is 891, which is at goal of over 500. Cont OTC Vit B12 1,000 mcg every other day and nutrient rich diet. We will continue to  monitor as deemed clinically necessary.    Vitamin D  deficiency Assessment & Plan: Lab Results  Component Value Date   VD25OH 96.9 07/09/2024   VD25OH 79.8 03/25/2024   VD25OH 8.5 (L) 12/06/2023   Reviewed labs with pt. Vitamin D  level is above goal of 50 to 70. Currently no signs or symptoms of hypervitaminosis D. She is currently taking strength Vit D every 10 days. Shared decision making: Take strength Vit D every 30 days as she has a few remaining tablets. Recheck in 3-4 months.    Objective:   PHYSICAL EXAM: Blood pressure 113/70, pulse (!) 57, temperature 97.9 F (36.6 C), height 5' 1 (1.549 m), weight 182 lb (82.6 kg), SpO2 97%. Body mass index is 34.39 kg/m.  General: she  is overweight, cooperative and in no acute distress. PSYCH: Has normal mood, affect and thought process.   HEENT: EOMI, sclerae are anicteric. Lungs: Normal breathing effort, no conversational dyspnea. Extremities: Moves * 4 Neurologic: A and O * 3, good insight  DIAGNOSTIC DATA REVIEWED: BMET    Component Value Date/Time   NA 142 07/09/2024 1434   K 3.7 07/09/2024 1434   CL 105 07/09/2024 1434   CO2 23 07/09/2024 1434   GLUCOSE 82 07/09/2024 1434   GLUCOSE 128 (H) 08/24/2023 1233   BUN 18 07/09/2024 1434   CREATININE 0.65 07/09/2024 1434   CALCIUM  9.6 07/09/2024 1434   GFRNONAA >60 10/04/2022 0355   GFRAA >60 01/25/2020 0303   Lab Results  Component Value Date   HGBA1C 5.0 12/06/2023   HGBA1C 5.2 08/24/2023   Lab Results  Component Value Date   INSULIN  7.3 07/09/2024   INSULIN  7.3 12/06/2023   Lab Results  Component Value Date   TSH 1.540 12/06/2023   CBC    Component Value Date/Time   WBC 7.2 12/06/2023 1232   WBC 12.9 (H) 08/24/2023 1233   RBC 5.33 (H) 12/06/2023 1232   RBC 4.90 08/24/2023 1233   HGB 15.4 12/06/2023 1232   HCT 47.8 (H) 12/06/2023 1232   PLT 239 12/06/2023 1232   MCV 90 12/06/2023 1232   MCH 28.9 12/06/2023 1232   MCH 29.1 10/04/2022 0355   MCHC  32.2 12/06/2023 1232   MCHC 32.6 08/24/2023 1233   RDW 12.8 12/06/2023 1232   Iron Studies No results found for: IRON, TIBC, FERRITIN, IRONPCTSAT Lipid Panel     Component Value Date/Time   CHOL 154 03/25/2024 1438   TRIG 104 03/25/2024 1438   HDL 37 (L) 03/25/2024 1438   CHOLHDL 4.2 03/25/2024 1438   CHOLHDL 3 08/24/2023 1233   VLDL 17.4 08/24/2023 1233   LDLCALC 98 03/25/2024 1438   LDLDIRECT 97 03/25/2024 1438   LDLDIRECT 153.9 07/10/2013 1045   Hepatic Function Panel     Component Value Date/Time   PROT 7.3 12/06/2023 1232   ALBUMIN 4.6 12/06/2023 1232   AST 34 12/06/2023 1232   ALT 28 12/06/2023 1232   ALKPHOS 129 (H) 12/06/2023 1232   BILITOT 0.9 12/06/2023 1232   BILIDIR 0.2 01/25/2020 0303   IBILI 1.1 (H) 01/25/2020 0303      Component Value Date/Time   TSH 1.540 12/06/2023 1232   Nutritional Lab Results  Component Value Date   VD25OH 96.9 07/09/2024   VD25OH 79.8 03/25/2024   VD25OH 8.5 (L) 12/06/2023     Follow up:   Return 09/10/2024 at 2:00 PM.  She was informed of the importance of frequent follow up visits to maximize her success with intensive lifestyle modifications for her multiple health conditions.   Attestations:   I, Special Puri, acting as a stage manager for [employer], DO., have compiled all relevant documentation for today's office visit on behalf of Linda Jenkins, DO, while in the presence of [employer], DO.  Pertinent positives were addressed with patient today. Reviewed by clinician on day of visit: allergies, medications, problem list, medical history, surgical history, family history, social history, and previous encounter notes.  I have spent 40 minutes in the care of the patient today including 31 minutes face-to-face assessing and reviewing listed medical problems above as outlined in office visit note and providing nutritional and behavioral counseling as outlined in obesity care plan.   I have reviewed the  above documentation for accuracy and  completeness, and I agree with the above. Linda Blackburn, D.O.  The 21st Century Cures Act was signed into law in 2016 which includes the topic of electronic health records.  This provides immediate access to information in MyChart. This includes consultation notes, operative notes, office notes, lab results and pathology reports.  If you have any questions about what you read please let us  know at your next visit so we can discuss your concerns and take corrective action if need be.  We are right here with you.

## 2024-08-19 ENCOUNTER — Inpatient Hospital Stay: Admission: RE | Admit: 2024-08-19 | Discharge: 2024-08-19 | Attending: Family Medicine | Admitting: Family Medicine

## 2024-08-19 DIAGNOSIS — Z1231 Encounter for screening mammogram for malignant neoplasm of breast: Secondary | ICD-10-CM

## 2024-08-25 ENCOUNTER — Encounter: Payer: Self-pay | Admitting: Family Medicine

## 2024-08-25 ENCOUNTER — Ambulatory Visit: Admitting: Family Medicine

## 2024-08-25 VITALS — BP 138/68 | HR 61 | Temp 97.8°F | Ht 60.63 in | Wt 186.7 lb

## 2024-08-25 DIAGNOSIS — K2100 Gastro-esophageal reflux disease with esophagitis, without bleeding: Secondary | ICD-10-CM

## 2024-08-25 DIAGNOSIS — F339 Major depressive disorder, recurrent, unspecified: Secondary | ICD-10-CM

## 2024-08-25 DIAGNOSIS — Z1211 Encounter for screening for malignant neoplasm of colon: Secondary | ICD-10-CM

## 2024-08-25 DIAGNOSIS — Q453 Other congenital malformations of pancreas and pancreatic duct: Secondary | ICD-10-CM | POA: Diagnosis not present

## 2024-08-25 DIAGNOSIS — F5102 Adjustment insomnia: Secondary | ICD-10-CM | POA: Diagnosis not present

## 2024-08-25 DIAGNOSIS — I10 Essential (primary) hypertension: Secondary | ICD-10-CM | POA: Diagnosis not present

## 2024-08-25 MED ORDER — LOSARTAN POTASSIUM 100 MG PO TABS: 100.0000 mg | ORAL_TABLET | Freq: Every day | ORAL | 3 refills | Status: AC

## 2024-08-25 MED ORDER — SERTRALINE HCL 100 MG PO TABS: ORAL_TABLET | ORAL | 3 refills | Status: AC

## 2024-08-25 MED ORDER — ZOLPIDEM TARTRATE 5 MG PO TABS: 5.0000 mg | ORAL_TABLET | Freq: Every evening | ORAL | 0 refills | Status: AC | PRN

## 2024-08-25 MED ORDER — HYDROCODONE-ACETAMINOPHEN 5-325 MG PO TABS: 1.0000 | ORAL_TABLET | Freq: Four times a day (QID) | ORAL | 0 refills | Status: DC | PRN

## 2024-08-25 MED ORDER — PANTOPRAZOLE SODIUM 40 MG PO TBEC: 40.0000 mg | DELAYED_RELEASE_TABLET | Freq: Every day | ORAL | 3 refills | Status: AC

## 2024-08-25 MED ORDER — FLUCONAZOLE 100 MG PO TABS: 100.0000 mg | ORAL_TABLET | Freq: Every day | ORAL | 0 refills | Status: DC | PRN

## 2024-08-25 NOTE — Patient Instructions (Signed)
 Have placed referral for repeat colonoscopy.

## 2024-08-25 NOTE — Progress Notes (Signed)
 "  Established Patient Office Visit  Subjective   Patient ID: Linda Blackburn, female    DOB: 1951/02/21  Age: 73 y.o. MRN: [id number]  Chief Complaint  Patient presents with   Annual Exam    HPI   Linda Blackburn is seen for routine ongoing medical follow-up.  She needs several refills.  Last year at this time her weight was 244 pounds down to current weight of 186 pounds.  She has lost over 60 pounds intentionally and feels much better overall.  She has been going to health and weight management and has made some significant dietary changes and also exercising regularly several times a week between treadmill and bicycle.  She hopes to lose about 30 more pounds.  She has had more stamina and ability exercise since losing the weight.  She does have hypertension treated with losartan  100 mg daily.  Only rare episodes of dizziness with standing but infrequently.  Blood pressures have been well-controlled by home readings overall.  She has reflux history and takes Protonix  40 mg daily.  Requesting refills.  Recent reflux symptoms have been controlled.  She has history of colon polyps and is way overdue for colonoscopy.  We made referral last year but she had difficulties for some reason with this being scheduled.  She would like us  to replace referral again.  She has recurrent depression and has been on sertraline  100 mg daily for quite some time.  She feels like her current depression symptoms are stable.  She has transient insomnia and very rarely takes Ambien  and requesting refill to have 5 or 10 on hand for severe insomnia.  She has taken Vicodin in the past for flareups of acute pancreatitis.  She has had multiple flareups over the years.  She is advised to have 8 or 10 on hand just in case she has severe flareup over the weekend  Past Medical History:  Diagnosis Date   Abnormal uterine bleeding    Amenorrhea    Anxiety    Anxiety    B12 deficiency    Back pain    Constipation    Depression     Gallstones    GERD (gastroesophageal reflux disease)    Hay fever    HTN (hypertension)    IBS (irritable bowel syndrome)    Osteoarthritis    Palpitations    Pancreatic disease    Pancreatitis    Shortness of breath    Swallowing difficulty    Swelling of lower extremity    Urine incontinence    UTI (urinary tract infection)    reoccuring    Past Surgical History:  Procedure Laterality Date   ABDOMINAL HYSTERECTOMY  1984   fibroids   APPENDECTOMY  1984   BILIARY DILATION  05/29/2019   Procedure: BILIARY DILATION;  Surgeon: Wilhelmenia Aloha Raddle., MD;  Location: [facility];  Service: Gastroenterology;;   BIOPSY  05/29/2019   Procedure: BIOPSY;  Surgeon: Wilhelmenia Aloha Raddle., MD;  Location: [facility];  Service: Gastroenterology;;   CATARACT EXTRACTION Right 11/22/2021   CHOLECYSTECTOMY  09/08/1999   ERCP N/A 05/29/2019   Procedure: ENDOSCOPIC RETROGRADE CHOLANGIOPANCREATOGRAPHY (ERCP);  Surgeon: Wilhelmenia Aloha Raddle., MD;  Location: [facility];  Service: Gastroenterology;  Laterality: N/A;   ERCP W/ SPHINCTEROTOMY AND BALLOON DILATION  08/2008   ESOPHAGOGASTRODUODENOSCOPY (EGD) WITH PROPOFOL  N/A 05/29/2019   Procedure: ESOPHAGOGASTRODUODENOSCOPY (EGD) WITH PROPOFOL ;  Surgeon: Wilhelmenia Aloha Raddle., MD;  Location: [facility];  Service: Gastroenterology;  Laterality: N/A;   OOPHORECTOMY  Only 1 was taken   POLYPECTOMY  05/29/2019   Procedure: POLYPECTOMY;  Surgeon: Mansouraty, Aloha Raddle., MD;  Location: [facility];  Service: Gastroenterology;;   REMOVAL OF STONES  05/29/2019   Procedure: REMOVAL OF STONES;  Surgeon: Wilhelmenia Aloha Raddle., MD;  Location: [facility];  Service: Gastroenterology;;   TONSILLECTOMY     TUBAL LIGATION      reports that she has never smoked. She has never used smokeless tobacco. She reports that she does not currently use alcohol. She reports that she does not use drugs. family history includes Alcohol abuse in her mother;  Cerebral palsy in her granddaughter; Depression in her mother; Hiatal hernia in her maternal aunt; Obesity in her mother; Prostate cancer in her father and paternal grandfather; Stroke in her maternal aunt. Allergies[1]  Review of Systems  Constitutional:  Negative for chills, fever and malaise/fatigue.  Eyes:  Negative for blurred vision.  Respiratory:  Negative for shortness of breath.   Cardiovascular:  Negative for chest pain.  Neurological:  Negative for dizziness, weakness and headaches.      Objective:     BP 138/68 (BP Location: Right Arm, Cuff Size: Large)   Pulse 61   Temp 97.8 F (36.6 C) (Oral)   Ht 5' 0.63 (1.54 m)   Wt 186 lb 11.2 oz (84.7 kg)   SpO2 95%   BMI 35.71 kg/m  BP Readings from Last 3 Encounters:  08/25/24 138/68  08/06/24 113/70  07/14/24 138/76   Wt Readings from Last 3 Encounters:  08/25/24 186 lb 11.2 oz (84.7 kg)  08/06/24 182 lb (82.6 kg)  07/14/24 182 lb (82.6 kg)      Physical Exam Vitals reviewed.  Constitutional:      General: She is not in acute distress.    Appearance: She is well-developed. She is not ill-appearing.  Eyes:     Pupils: Pupils are equal, round, and reactive to light.  Neck:     Thyroid : No thyromegaly.     Vascular: No JVD.  Cardiovascular:     Rate and Rhythm: Normal rate and regular rhythm.     Heart sounds:     No gallop.  Pulmonary:     Effort: Pulmonary effort is normal. No respiratory distress.     Breath sounds: Normal breath sounds. No wheezing or rales.  Musculoskeletal:     Cervical back: Neck supple.     Right lower leg: No edema.     Left lower leg: No edema.  Neurological:     Mental Status: She is alert.      No results found for any visits on 08/25/24.    The 10-year ASCVD risk score (Arnett DK, et al., 2019) is: 23.4%    Assessment & Plan:   Problem List Items Addressed This Visit       Unprioritized   HTN (hypertension)   Relevant Medications   losartan  (COZAAR ) 100 MG  tablet   Gastroesophageal reflux disease   Relevant Medications   pantoprazole  (PROTONIX ) 40 MG tablet   Depression, recurrent   Relevant Medications   sertraline  (ZOLOFT ) 100 MG tablet   Other Visit Diagnoses       Colon cancer screening    -  Primary   Relevant Orders   Ambulatory referral to Gastroenterology     Multiple chronic medical problems as above.  She has done tremendous job this past year with weight loss and has lost over 60 pounds.  Blood pressure still borderline elevated today.  Came down slightly with rest.  Continue close monitoring and be in touch if consistent readings over 140 systolic.  Should continue to improve hopefully with additional weight loss and exercise.  -Refilled several chronic medications today including losartan , Protonix , sertraline - -placed referral back to GI for follow-up colonoscopy which is overdue - Refilled limited Ambien  to use as needed for severe insomnia - Refilled limited hydrocodone  to have on hand for recurrent acute pancreatitis.  She has history of pancreas divisum with recurrent pancreatitis flareups.  Refilled #12  No follow-ups on file.    Wolm Scarlet, MD     [1]  Allergies Allergen Reactions   Other Hives and Swelling    AQUASONIC US  GEL Patient did not have a reaction in 2024 when this was used inadvertently to image the knee.   Buprenorphine Hcl    Codeine     GI upset   Dilaudid  [Hydromorphone  Hcl] Itching   Morphine  And Codeine     Ants crawling on skin   Diclofenac  Sodium Rash    Per pt rash behind knee    "

## 2024-09-09 ENCOUNTER — Telehealth: Payer: Self-pay | Admitting: Family Medicine

## 2024-09-09 NOTE — Telephone Encounter (Unsigned)
 Copied from CRM #[id number]. Topic: Clinical - Medication Refill >> Sep 09, 2024  1:21 PM [place] J wrote: Medication: ondansetron  (ZOFRAN -ODT) 4 MG disintegrating tablet  Has the patient contacted their pharmacy? No (Agent: If no, request that the patient contact the pharmacy for the refill. If patient does not wish to contact the pharmacy document the reason why and proceed with request.) (Agent: If yes, when and what did the pharmacy advise?) Patient wanted to call us  first since this medication is the one she gets refilled annually.  This is the patient's preferred pharmacy:  [facility] - [place], [place] - [address] [place] [address] Phone: [phone] Fax: [phone]  Is this the correct pharmacy for this prescription? Yes If no, delete pharmacy and type the correct one.   Has the prescription been filled recently? No  Is the patient out of the medication? Yes  Has the patient been seen for an appointment in the last year OR does the patient have an upcoming appointment? Yes  Can we respond through MyChart? Yes  Agent: Please be advised that Rx refills may take up to 3 business days. We ask that you follow-up with your pharmacy.

## 2024-09-10 ENCOUNTER — Ambulatory Visit (INDEPENDENT_AMBULATORY_CARE_PROVIDER_SITE_OTHER): Payer: Self-pay | Admitting: Family Medicine

## 2024-09-10 ENCOUNTER — Encounter (INDEPENDENT_AMBULATORY_CARE_PROVIDER_SITE_OTHER): Payer: Self-pay | Admitting: Family Medicine

## 2024-09-10 DIAGNOSIS — I10 Essential (primary) hypertension: Secondary | ICD-10-CM | POA: Diagnosis not present

## 2024-09-10 DIAGNOSIS — M810 Age-related osteoporosis without current pathological fracture: Secondary | ICD-10-CM

## 2024-09-10 DIAGNOSIS — E559 Vitamin D deficiency, unspecified: Secondary | ICD-10-CM | POA: Diagnosis not present

## 2024-09-10 DIAGNOSIS — E88819 Insulin resistance, unspecified: Secondary | ICD-10-CM

## 2024-09-10 DIAGNOSIS — Z6834 Body mass index (BMI) 34.0-34.9, adult: Secondary | ICD-10-CM | POA: Diagnosis not present

## 2024-09-10 DIAGNOSIS — E538 Deficiency of other specified B group vitamins: Secondary | ICD-10-CM

## 2024-09-10 MED ORDER — ONDANSETRON 8 MG PO TBDP: 8.0000 mg | ORAL_TABLET | Freq: Three times a day (TID) | ORAL | 1 refills | Status: AC | PRN

## 2024-09-10 NOTE — Progress Notes (Signed)
 "  Linda Blackburn, D.O.  ABFM, ABOM Specializing in Clinical Bariatric Medicine  Office located at: [address] [place], [place]  [address]     A) FOR THE CHRONIC DISEASE OF OBESITY:  Morbid obesity (HCC) - start BMI 39.8 BMI 34.0-34.9,adult - current BMI 34.6  Chief complaint: Obesity Linda Blackburn is here to discuss her progress with her obesity treatment plan.   History of present illness / Interval history:  Linda Blackburn is here today for her follow-up office visit.  Since last OV on 08/06/2024, pt is up 1 lb.   Has had GI upset recently.    08/06/24 13:00 09/10/24   Body Fat % 47.9 % 47.9 %  Muscle Mass (lbs) 90.2 lbs 90.6 lbs  Fat Mass (lbs) 87.2 lbs 87.8 lbs  Total Body Water (lbs) 72.8 lbs 71.8 lbs  Visceral Fat Rating  15 15  Counseling done on how various foods will affect these numbers and how to maximize success   Total lbs lost to date: -50 lbs Total Fat Mass in lbs lost to date: -40.6 Total weight loss percentage to date: -21.46 %   Nutrition Therapy She is on the Category 1 Plan and states she is following her eating plan approximately 88 % of the time.   - Tracking Calories/Macros: yes  - Eating More Whole Foods: yes  - Adequate Protein Intake: no  - Adequate Water Intake: no  - Skipping Meals: yes  - Sleeping 7-9 Hours/ Night: yes   Linda Blackburn is currently in the action stage of change. As such, her goal is to continue weight management plan.  She has agreed to: continue current plan   Physical Activity Pt is doing the treadmill or stationary bike 35  minutes 3-4 days per week   Linda Blackburn has been advised to work up to 300-450 minutes of moderate intensity aerobic activity a week and strengthening exercises 2-3 times per week for cardiovascular health, weight loss maintenance and preservation of muscle mass.  She has agreed to : Continue current level of physical activity , Increase physical activity in their day and reduce sedentary time  (increase NEAT)., Continue to gradually increase the amount and intensity of exercise routine, Increase volume of physical activity to a goal of 240 minutes a week, and Combine aerobic and strengthening exercises for efficiency and improved cardiometabolic health.   Behavioral Modifications Evidence-based interventions for health behavior change were utilized today including the discussion of  1) self monitoring techniques:  journal 2) problem-solving barriers:  pancreatic pain  3) self care:  exercise, meditation  4) SMART goals for next OV:  Exercise every day. Regarding patient's less desirable eating habits and patterns, we employed the technique of small changes.   We discussed the following today: increasing lean protein intake to established goals, decreasing simple carbohydrates , increasing water intake , keeping healthy foods at home, continue to work on implementation of reduced calorie nutritional plan, and continue to practice mindfulness when eating Additional resources provided today: None   Medical Interventions/ Pharmacotherapy Previous Bariatric surgery: n/a Pharmacotherapy for weight loss: She is not currently taking medications  for medical weight loss.    We discussed various medication options to help [name] with her weight loss efforts and we both agreed to : Continue with current nutritional and behavioral strategies   B) OBESITY RELATED CONDITIONS ADDRESSED TODAY:  Insulin  resistance Assessment & Plan Lab Results  Component Value Date   HGBA1C 5.0 12/06/2023   HGBA1C 5.2 08/24/2023  INSULIN  7.3 07/09/2024   INSULIN  7.3 12/06/2023  Currently managed by dietary and lifestyle habits. Was previously prescribed Metformin  250 mg twice daily, but discontinued because she felt it wasn't helping. Discussed other options for medications to help manage this condition. Pt declines for now; will revisit in the future. Hunger and cravings are overall well controlled. A1c is  controlled at 5.0. Cont to decrease simple carbs and sugars and increase lean protein. Increase exercise as able to promote weight loss, improve glycemic control, and prevent progression to T2DM.    Primary hypertension Assessment & Plan Last 3 blood pressure readings in our office are as follows: BP Readings from Last 3 Encounters:  09/10/24 137/70  08/25/24 138/68  08/06/24 113/70   The 10-year ASCVD risk score (Arnett DK, et al., 2019) is: 23.1%  Lab Results  Component Value Date   CREATININE 0.65 07/09/2024  Currently on losartan  100 mg once daily. BP is at goal at 137/70.  Pt asx. No acute concerns. Cont medication. Cont low-sodium, heart-healthy diet. Increase exercise and water intake. F/u with PCP as needed.    Vitamin D  deficiency Postmenopausal bone loss  Assessment & Plan Lab Results  Component Value Date   VD25OH 96.9 07/09/2024   VD25OH 79.8 03/25/2024   VD25OH 8.5 (L) 12/06/2023  Currently on Ergo 50K units one tablet every 30 days - changed early 08/2024, with good compliance and tolerance per pt preference. She is almost out of the Ergo and desires to have a daily supplement in the future, which I agree.  D/C Ergo.  Start OTC Vitamin D  2,000 units daily. Vit D levels are at goal. Will recheck levels as necessary.    B12 deficiency Assessment & Plan - B12 level is 891, at goal.  This diagnosis was reviewed with the patient and education was provided.  Lab Results  Component Value Date   VITAMINB12 891 07/09/2024  - Currently on OTC Vit B12 1,000 mcg every other day - Continue their prudent nutritional plan and focus on b12 rich foods such as lean red meats; poultry; eggs; seafood; beans, peas, and lentils; nuts and seeds; and soy products - We will continue to monitor as deemed clinically necessary.    Follow up:   Return 10/08/2024 2:00 PM.  She was informed of the importance of frequent follow up visits to maximize her success with intensive lifestyle  modifications for her multiple health conditions.   Weight Summary and Biometrics   Vitals Temp: 97.8 F (36.6 C) BP: (!) 153/73 Pulse Rate: (!) 56 SpO2: 96 %   Anthropometric Measurements Height: 5' 1 (1.549 m) Weight at Last Visit: 182lb Starting Weight: 233lb Peak Weight: 244lb   Other Clinical Data Fasting: no Labs: no Today's Visit #: 14 Starting Date: 12/06/23    Objective:   PHYSICAL EXAM: Blood pressure (!) 153/73, pulse (!) 56, temperature 97.8 F (36.6 C), height 5' 1 (1.549 m), SpO2 96%. Body mass index is 35.28 kg/m.  General: she is overweight, cooperative and in no acute distress. PSYCH: Has normal mood, affect and thought process.   HEENT: EOMI, sclerae are anicteric. Lungs: Normal breathing effort, no conversational dyspnea. Extremities: Moves * 4 Neurologic: A and O * 3, good insight  DIAGNOSTIC DATA REVIEWED: BMET    Component Value Date/Time   NA 142 07/09/2024 1434   K 3.7 07/09/2024 1434   CL 105 07/09/2024 1434   CO2 23 07/09/2024 1434   GLUCOSE 82 07/09/2024 1434   GLUCOSE 128 (H) 08/24/2023  1233   BUN 18 07/09/2024 1434   CREATININE 0.65 07/09/2024 1434   CALCIUM  9.6 07/09/2024 1434   GFRNONAA >60 10/04/2022 0355   GFRAA >60 01/25/2020 0303   Lab Results  Component Value Date   HGBA1C 5.0 12/06/2023   HGBA1C 5.2 08/24/2023   Lab Results  Component Value Date   INSULIN  7.3 07/09/2024   INSULIN  7.3 12/06/2023   Lab Results  Component Value Date   TSH 1.540 12/06/2023   CBC    Component Value Date/Time   WBC 7.2 12/06/2023 1232   WBC 12.9 (H) 08/24/2023 1233   RBC 5.33 (H) 12/06/2023 1232   RBC 4.90 08/24/2023 1233   HGB 15.4 12/06/2023 1232   HCT 47.8 (H) 12/06/2023 1232   PLT 239 12/06/2023 1232   MCV 90 12/06/2023 1232   MCH 28.9 12/06/2023 1232   MCH 29.1 10/04/2022 0355   MCHC 32.2 12/06/2023 1232   MCHC 32.6 08/24/2023 1233   RDW 12.8 12/06/2023 1232   Iron Studies No results found for: IRON,  TIBC, FERRITIN, IRONPCTSAT Lipid Panel     Component Value Date/Time   CHOL 154 03/25/2024 1438   TRIG 104 03/25/2024 1438   HDL 37 (L) 03/25/2024 1438   CHOLHDL 4.2 03/25/2024 1438   CHOLHDL 3 08/24/2023 1233   VLDL 17.4 08/24/2023 1233   LDLCALC 98 03/25/2024 1438   LDLDIRECT 97 03/25/2024 1438   LDLDIRECT 153.9 07/10/2013 1045   Hepatic Function Panel     Component Value Date/Time   PROT 7.3 12/06/2023 1232   ALBUMIN 4.6 12/06/2023 1232   AST 34 12/06/2023 1232   ALT 28 12/06/2023 1232   ALKPHOS 129 (H) 12/06/2023 1232   BILITOT 0.9 12/06/2023 1232   BILIDIR 0.2 01/25/2020 0303   IBILI 1.1 (H) 01/25/2020 0303      Component Value Date/Time   TSH 1.540 12/06/2023 1232   Nutritional Lab Results  Component Value Date   VD25OH 96.9 07/09/2024   VD25OH 79.8 03/25/2024   VD25OH 8.5 (L) 12/06/2023    Attestations:   Linda Blackburn, acting as a stage manager for Linda Jenkins, DO., have compiled all relevant documentation for today's office visit on behalf of Linda Jenkins, DO, while in the presence of [employer], DO.   I have reviewed the above documentation for accuracy and completeness, and I agree with the above. Linda JINNY Blackburn, D.O.  The 21st Century Cures Act was signed into law in 2016 which includes the topic of electronic health records.  This provides immediate access to information in MyChart.  This includes consultation notes, operative notes, office notes, lab results and pathology reports.  If you have any questions about what you read please let us  know at your next visit so we can discuss your concerns and take corrective action if need be.  We are right here with you.  "

## 2024-09-10 NOTE — Telephone Encounter (Signed)
"  Rx sent  "

## 2024-09-19 MED ORDER — VITAMIN D3 25 MCG (1000 UT) PO CAPS: 2000.0000 [IU] | ORAL_CAPSULE | Freq: Every day | ORAL | Status: AC

## 2024-09-22 ENCOUNTER — Encounter: Payer: Self-pay | Admitting: Internal Medicine

## 2024-09-25 ENCOUNTER — Other Ambulatory Visit: Payer: Self-pay | Admitting: Family Medicine

## 2024-10-08 ENCOUNTER — Encounter (INDEPENDENT_AMBULATORY_CARE_PROVIDER_SITE_OTHER): Payer: Self-pay

## 2024-10-08 ENCOUNTER — Telehealth: Payer: Self-pay

## 2024-10-08 ENCOUNTER — Ambulatory Visit (INDEPENDENT_AMBULATORY_CARE_PROVIDER_SITE_OTHER): Admitting: Family Medicine

## 2024-10-08 MED ORDER — HYDROCODONE-ACETAMINOPHEN 5-325 MG PO TABS: 1.0000 | ORAL_TABLET | Freq: Four times a day (QID) | ORAL | 0 refills | Status: DC | PRN

## 2024-10-08 NOTE — Telephone Encounter (Signed)
 Copied from CRM [id number]. Topic: Clinical - Medication Refill >> Oct 08, 2024  2:10 PM Brittany M wrote: Medication: HYDROcodone -acetaminophen  (NORCO/VICODIN) 5-325 MG tablet  Has the patient contacted their pharmacy? Yes (Agent: If no, request that the patient contact the pharmacy for the refill. If patient does not wish to contact the pharmacy document the reason why and proceed with request.) (Agent: If yes, when and what did the pharmacy advise?)  This is the patient's preferred pharmacy:  [facility] - [place], [place] - [address] [place] [address] Phone: [phone] Fax: [phone]   Is this the correct pharmacy for this prescription? Yes If no, delete pharmacy and type the correct one.   Has the prescription been filled recently? Yes  Is the patient out of the medication? Yes  Has the patient been seen for an appointment in the last year OR does the patient have an upcoming appointment? Yes  Can we respond through MyChart? Yes  Agent: Please be advised that Rx refills may take up to 3 business days. We ask that you follow-up with your pharmacy.

## 2024-10-09 MED ORDER — HYDROCODONE-ACETAMINOPHEN 5-325 MG PO TABS: 1.0000 | ORAL_TABLET | Freq: Four times a day (QID) | ORAL | 0 refills | Status: AC | PRN

## 2024-10-09 NOTE — Addendum Note (Signed)
 Addended by: MICHEAL WOLM ORN on: 10/09/2024 02:05 PM   Modules accepted: Orders

## 2024-11-05 ENCOUNTER — Ambulatory Visit (INDEPENDENT_AMBULATORY_CARE_PROVIDER_SITE_OTHER): Admitting: Family Medicine

## 2024-11-26 ENCOUNTER — Ambulatory Visit: Admitting: Internal Medicine

## 2024-12-03 ENCOUNTER — Ambulatory Visit (INDEPENDENT_AMBULATORY_CARE_PROVIDER_SITE_OTHER): Admitting: Family Medicine
